# Patient Record
Sex: Female | Born: 1950 | Race: White | Hispanic: No | Smoking: Former smoker
Health system: Southern US, Community
[De-identification: ages and names within clinical notes are randomized; demographics above are authoritative.]

## PROBLEM LIST (undated history)

## (undated) DIAGNOSIS — M1990 Unspecified osteoarthritis, unspecified site: Secondary | ICD-10-CM

## (undated) DIAGNOSIS — J45909 Unspecified asthma, uncomplicated: Secondary | ICD-10-CM

## (undated) DIAGNOSIS — D649 Anemia, unspecified: Secondary | ICD-10-CM

## (undated) DIAGNOSIS — I10 Essential (primary) hypertension: Secondary | ICD-10-CM

## (undated) DIAGNOSIS — R0600 Dyspnea, unspecified: Secondary | ICD-10-CM

## (undated) DIAGNOSIS — J449 Chronic obstructive pulmonary disease, unspecified: Secondary | ICD-10-CM

## (undated) DIAGNOSIS — G5602 Carpal tunnel syndrome, left upper limb: Secondary | ICD-10-CM

## (undated) DIAGNOSIS — Z87442 Personal history of urinary calculi: Secondary | ICD-10-CM

## (undated) DIAGNOSIS — F419 Anxiety disorder, unspecified: Secondary | ICD-10-CM

## (undated) DIAGNOSIS — E785 Hyperlipidemia, unspecified: Secondary | ICD-10-CM

## (undated) DIAGNOSIS — J189 Pneumonia, unspecified organism: Secondary | ICD-10-CM

## (undated) DIAGNOSIS — I639 Cerebral infarction, unspecified: Secondary | ICD-10-CM

## (undated) HISTORY — PX: CARPAL TUNNEL RELEASE: SHX101

## (undated) HISTORY — PX: ROTATOR CUFF REPAIR: SHX139

## (undated) HISTORY — PX: TUBAL LIGATION: SHX77

## (undated) HISTORY — PX: OTHER SURGICAL HISTORY: SHX169

---

## 2004-12-29 ENCOUNTER — Ambulatory Visit: Payer: Self-pay | Admitting: Family Medicine

## 2005-08-15 ENCOUNTER — Ambulatory Visit: Payer: Self-pay | Admitting: Family Medicine

## 2005-09-04 ENCOUNTER — Ambulatory Visit: Payer: Self-pay | Admitting: Family Medicine

## 2005-10-12 ENCOUNTER — Ambulatory Visit: Payer: Self-pay | Admitting: Gastroenterology

## 2005-12-21 ENCOUNTER — Ambulatory Visit: Payer: Self-pay | Admitting: Physician Assistant

## 2006-01-05 ENCOUNTER — Other Ambulatory Visit: Payer: Self-pay

## 2006-01-12 ENCOUNTER — Ambulatory Visit: Payer: Self-pay | Admitting: Unknown Physician Specialty

## 2006-01-21 ENCOUNTER — Emergency Department: Payer: Self-pay | Admitting: Emergency Medicine

## 2006-01-21 ENCOUNTER — Other Ambulatory Visit: Payer: Self-pay

## 2006-09-25 ENCOUNTER — Ambulatory Visit: Payer: Self-pay | Admitting: Family Medicine

## 2006-10-09 ENCOUNTER — Ambulatory Visit: Payer: Self-pay | Admitting: Family Medicine

## 2006-10-30 ENCOUNTER — Ambulatory Visit: Payer: Self-pay | Admitting: Family Medicine

## 2007-05-26 ENCOUNTER — Emergency Department: Payer: Self-pay | Admitting: Emergency Medicine

## 2007-07-10 ENCOUNTER — Ambulatory Visit: Payer: Self-pay | Admitting: Family Medicine

## 2007-12-10 ENCOUNTER — Ambulatory Visit: Payer: Self-pay | Admitting: Family Medicine

## 2008-02-19 ENCOUNTER — Encounter: Payer: Self-pay | Admitting: Family Medicine

## 2008-02-26 ENCOUNTER — Encounter: Payer: Self-pay | Admitting: Family Medicine

## 2008-03-28 ENCOUNTER — Encounter: Payer: Self-pay | Admitting: Family Medicine

## 2009-04-05 ENCOUNTER — Inpatient Hospital Stay: Payer: Self-pay | Admitting: Internal Medicine

## 2009-06-02 ENCOUNTER — Ambulatory Visit: Payer: Self-pay | Admitting: Family Medicine

## 2009-11-03 ENCOUNTER — Ambulatory Visit: Payer: Self-pay

## 2012-08-16 ENCOUNTER — Inpatient Hospital Stay: Payer: Self-pay | Admitting: Internal Medicine

## 2012-08-16 LAB — URINALYSIS, COMPLETE
Bilirubin,UR: NEGATIVE
Hyaline Cast: 12
Nitrite: NEGATIVE
Ph: 5 (ref 4.5–8.0)
Protein: 30
RBC,UR: 2 /HPF (ref 0–5)
Specific Gravity: 1.029 (ref 1.003–1.030)
Squamous Epithelial: 12
WBC UR: 4 /HPF (ref 0–5)

## 2012-08-16 LAB — CBC
HCT: 44.2 % (ref 35.0–47.0)
HGB: 14.7 g/dL (ref 12.0–16.0)
MCH: 28.5 pg (ref 26.0–34.0)
MCHC: 33.2 g/dL (ref 32.0–36.0)
Platelet: 198 10*3/uL (ref 150–440)
RDW: 15.1 % — ABNORMAL HIGH (ref 11.5–14.5)
WBC: 13.9 10*3/uL — ABNORMAL HIGH (ref 3.6–11.0)

## 2012-08-16 LAB — COMPREHENSIVE METABOLIC PANEL
Alkaline Phosphatase: 57 U/L (ref 50–136)
Anion Gap: 6 — ABNORMAL LOW (ref 7–16)
Calcium, Total: 8.7 mg/dL (ref 8.5–10.1)
Chloride: 100 mmol/L (ref 98–107)
Co2: 31 mmol/L (ref 21–32)
Creatinine: 1.59 mg/dL — ABNORMAL HIGH (ref 0.60–1.30)
EGFR (African American): 40 — ABNORMAL LOW
EGFR (Non-African Amer.): 34 — ABNORMAL LOW
Potassium: 3.3 mmol/L — ABNORMAL LOW (ref 3.5–5.1)
SGOT(AST): 28 U/L (ref 15–37)
Total Protein: 7.6 g/dL (ref 6.4–8.2)

## 2012-08-16 LAB — RAPID INFLUENZA A&B ANTIGENS

## 2012-08-17 LAB — CBC WITH DIFFERENTIAL/PLATELET
Basophil #: 0 10*3/uL (ref 0.0–0.1)
Basophil %: 0.1 %
Eosinophil #: 0 10*3/uL (ref 0.0–0.7)
HCT: 41.5 % (ref 35.0–47.0)
HGB: 14 g/dL (ref 12.0–16.0)
Lymphocyte #: 0.8 10*3/uL — ABNORMAL LOW (ref 1.0–3.6)
Lymphocyte %: 7.3 %
MCHC: 33.8 g/dL (ref 32.0–36.0)
Monocyte #: 0.2 x10 3/mm (ref 0.2–0.9)
Monocyte %: 1.9 %
Neutrophil #: 10 10*3/uL — ABNORMAL HIGH (ref 1.4–6.5)
Platelet: 203 10*3/uL (ref 150–440)
RBC: 4.86 10*6/uL (ref 3.80–5.20)
WBC: 11 10*3/uL (ref 3.6–11.0)

## 2012-08-17 LAB — BASIC METABOLIC PANEL
Anion Gap: 9 (ref 7–16)
Calcium, Total: 8.6 mg/dL (ref 8.5–10.1)
Chloride: 99 mmol/L (ref 98–107)
EGFR (Non-African Amer.): 31 — ABNORMAL LOW
Glucose: 197 mg/dL — ABNORMAL HIGH (ref 65–99)
Osmolality: 281 (ref 275–301)
Potassium: 3.5 mmol/L (ref 3.5–5.1)

## 2012-08-18 LAB — BASIC METABOLIC PANEL
Anion Gap: 9 (ref 7–16)
BUN: 23 mg/dL — ABNORMAL HIGH (ref 7–18)
Calcium, Total: 8.7 mg/dL (ref 8.5–10.1)
Co2: 26 mmol/L (ref 21–32)
Creatinine: 1.5 mg/dL — ABNORMAL HIGH (ref 0.60–1.30)
EGFR (African American): 43 — ABNORMAL LOW
Glucose: 218 mg/dL — ABNORMAL HIGH (ref 65–99)
Osmolality: 282 (ref 275–301)
Potassium: 3.8 mmol/L (ref 3.5–5.1)

## 2012-08-18 LAB — MAGNESIUM: Magnesium: 2.1 mg/dL

## 2012-08-19 LAB — BASIC METABOLIC PANEL
Anion Gap: 7 (ref 7–16)
BUN: 29 mg/dL — ABNORMAL HIGH (ref 7–18)
Calcium, Total: 9.4 mg/dL (ref 8.5–10.1)
Creatinine: 1.54 mg/dL — ABNORMAL HIGH (ref 0.60–1.30)
EGFR (African American): 41 — ABNORMAL LOW
EGFR (Non-African Amer.): 36 — ABNORMAL LOW
Glucose: 224 mg/dL — ABNORMAL HIGH (ref 65–99)
Osmolality: 283 (ref 275–301)

## 2012-08-20 LAB — BASIC METABOLIC PANEL
BUN: 35 mg/dL — ABNORMAL HIGH (ref 7–18)
Calcium, Total: 9.4 mg/dL (ref 8.5–10.1)
Co2: 27 mmol/L (ref 21–32)
EGFR (African American): 38 — ABNORMAL LOW
Glucose: 244 mg/dL — ABNORMAL HIGH (ref 65–99)
Sodium: 138 mmol/L (ref 136–145)

## 2012-08-21 LAB — PLATELET COUNT: Platelet: 242 10*3/uL (ref 150–440)

## 2012-08-22 LAB — BASIC METABOLIC PANEL
Calcium, Total: 8.8 mg/dL (ref 8.5–10.1)
EGFR (African American): 43 — ABNORMAL LOW
EGFR (Non-African Amer.): 37 — ABNORMAL LOW
Glucose: 295 mg/dL — ABNORMAL HIGH (ref 65–99)
Osmolality: 292 (ref 275–301)

## 2012-08-22 LAB — CULTURE, BLOOD (SINGLE)

## 2013-08-07 ENCOUNTER — Ambulatory Visit: Payer: Self-pay | Admitting: Internal Medicine

## 2014-04-23 ENCOUNTER — Emergency Department: Payer: Self-pay | Admitting: Emergency Medicine

## 2014-04-23 LAB — COMPREHENSIVE METABOLIC PANEL
ALT: 16 U/L
Albumin: 3.2 g/dL — ABNORMAL LOW (ref 3.4–5.0)
Alkaline Phosphatase: 70 U/L
Anion Gap: 8 (ref 7–16)
BILIRUBIN TOTAL: 0.4 mg/dL (ref 0.2–1.0)
BUN: 14 mg/dL (ref 7–18)
Calcium, Total: 9.3 mg/dL (ref 8.5–10.1)
Chloride: 104 mmol/L (ref 98–107)
Co2: 29 mmol/L (ref 21–32)
Creatinine: 1.34 mg/dL — ABNORMAL HIGH (ref 0.60–1.30)
EGFR (African American): 48 — ABNORMAL LOW
GFR CALC NON AF AMER: 42 — AB
Glucose: 90 mg/dL (ref 65–99)
OSMOLALITY: 281 (ref 275–301)
POTASSIUM: 3.8 mmol/L (ref 3.5–5.1)
SGOT(AST): 22 U/L (ref 15–37)
Sodium: 141 mmol/L (ref 136–145)
Total Protein: 7.4 g/dL (ref 6.4–8.2)

## 2014-04-23 LAB — CBC
HCT: 41.9 % (ref 35.0–47.0)
HGB: 13.7 g/dL (ref 12.0–16.0)
MCH: 28.4 pg (ref 26.0–34.0)
MCHC: 32.7 g/dL (ref 32.0–36.0)
MCV: 87 fL (ref 80–100)
PLATELETS: 220 10*3/uL (ref 150–440)
RBC: 4.82 10*6/uL (ref 3.80–5.20)
RDW: 14.7 % — ABNORMAL HIGH (ref 11.5–14.5)
WBC: 14.6 10*3/uL — ABNORMAL HIGH (ref 3.6–11.0)

## 2014-04-23 LAB — TROPONIN I: Troponin-I: <0.02 ng/mL

## 2014-05-21 ENCOUNTER — Inpatient Hospital Stay: Payer: Self-pay | Admitting: Specialist

## 2014-05-21 LAB — PRO B NATRIURETIC PEPTIDE: B-Type Natriuretic Peptide: 301 pg/mL — ABNORMAL HIGH (ref 0–125)

## 2014-05-21 LAB — COMPREHENSIVE METABOLIC PANEL
ALBUMIN: 2.6 g/dL — AB (ref 3.4–5.0)
ALK PHOS: 60 U/L
ANION GAP: 8 (ref 7–16)
BUN: 13 mg/dL (ref 7–18)
Bilirubin,Total: 0.3 mg/dL (ref 0.2–1.0)
CALCIUM: 8.6 mg/dL (ref 8.5–10.1)
CREATININE: 1.17 mg/dL (ref 0.60–1.30)
Chloride: 100 mmol/L (ref 98–107)
Co2: 28 mmol/L (ref 21–32)
EGFR (African American): 60 — ABNORMAL LOW
EGFR (Non-African Amer.): 50 — ABNORMAL LOW
GLUCOSE: 130 mg/dL — AB (ref 65–99)
OSMOLALITY: 274 (ref 275–301)
POTASSIUM: 4.1 mmol/L (ref 3.5–5.1)
SGOT(AST): 38 U/L — ABNORMAL HIGH (ref 15–37)
SGPT (ALT): 13 U/L — ABNORMAL LOW
Sodium: 136 mmol/L (ref 136–145)
Total Protein: 7.7 g/dL (ref 6.4–8.2)

## 2014-05-21 LAB — CBC
HCT: 38.2 % (ref 35.0–47.0)
HGB: 11.9 g/dL — ABNORMAL LOW (ref 12.0–16.0)
MCH: 26.7 pg (ref 26.0–34.0)
MCHC: 31.1 g/dL — AB (ref 32.0–36.0)
MCV: 86 fL (ref 80–100)
Platelet: 307 10*3/uL (ref 150–440)
RBC: 4.45 10*6/uL (ref 3.80–5.20)
RDW: 14.1 % (ref 11.5–14.5)
WBC: 17.4 10*3/uL — ABNORMAL HIGH (ref 3.6–11.0)

## 2014-05-21 LAB — TROPONIN I

## 2014-05-21 LAB — CK TOTAL AND CKMB (NOT AT ARMC)
CK, TOTAL: 108 U/L
CK-MB: 0.8 ng/mL (ref 0.5–3.6)

## 2014-05-22 LAB — BASIC METABOLIC PANEL
ANION GAP: 11 (ref 7–16)
BUN: 19 mg/dL — AB (ref 7–18)
CHLORIDE: 100 mmol/L (ref 98–107)
CO2: 25 mmol/L (ref 21–32)
CREATININE: 1.44 mg/dL — AB (ref 0.60–1.30)
Calcium, Total: 9 mg/dL (ref 8.5–10.1)
EGFR (African American): 47 — ABNORMAL LOW
EGFR (Non-African Amer.): 39 — ABNORMAL LOW
Glucose: 250 mg/dL — ABNORMAL HIGH (ref 65–99)
OSMOLALITY: 283 (ref 275–301)
Potassium: 3.4 mmol/L — ABNORMAL LOW (ref 3.5–5.1)
SODIUM: 136 mmol/L (ref 136–145)

## 2014-05-22 LAB — CBC WITH DIFFERENTIAL/PLATELET
BASOS PCT: 0.2 %
Basophil #: 0 10*3/uL (ref 0.0–0.1)
Eosinophil #: 0 10*3/uL (ref 0.0–0.7)
Eosinophil %: 0 %
HCT: 36.7 % (ref 35.0–47.0)
HGB: 11.8 g/dL — ABNORMAL LOW (ref 12.0–16.0)
LYMPHS ABS: 0.9 10*3/uL — AB (ref 1.0–3.6)
LYMPHS PCT: 5.9 %
MCH: 27.4 pg (ref 26.0–34.0)
MCHC: 32.3 g/dL (ref 32.0–36.0)
MCV: 85 fL (ref 80–100)
Monocyte #: 0.5 x10 3/mm (ref 0.2–0.9)
Monocyte %: 2.8 %
NEUTROS ABS: 14.5 10*3/uL — AB (ref 1.4–6.5)
NEUTROS PCT: 91.1 %
Platelet: 310 10*3/uL (ref 150–440)
RBC: 4.32 10*6/uL (ref 3.80–5.20)
RDW: 14.1 % (ref 11.5–14.5)
WBC: 15.9 10*3/uL — AB (ref 3.6–11.0)

## 2014-05-23 LAB — CBC WITH DIFFERENTIAL/PLATELET
Basophil #: 0.2 10*3/uL — ABNORMAL HIGH (ref 0.0–0.1)
Basophil %: 0.7 %
EOS PCT: 0.1 %
Eosinophil #: 0 10*3/uL (ref 0.0–0.7)
HCT: 37.5 % (ref 35.0–47.0)
HGB: 11.7 g/dL — AB (ref 12.0–16.0)
Lymphocyte #: 0.8 10*3/uL — ABNORMAL LOW (ref 1.0–3.6)
Lymphocyte %: 3.4 %
MCH: 27.1 pg (ref 26.0–34.0)
MCHC: 31 g/dL — ABNORMAL LOW (ref 32.0–36.0)
MCV: 87 fL (ref 80–100)
MONOS PCT: 3.4 %
Monocyte #: 0.9 x10 3/mm (ref 0.2–0.9)
Neutrophil #: 22.9 10*3/uL — ABNORMAL HIGH (ref 1.4–6.5)
Neutrophil %: 92.4 %
Platelet: 335 10*3/uL (ref 150–440)
RBC: 4.31 10*6/uL (ref 3.80–5.20)
RDW: 14.4 % (ref 11.5–14.5)
WBC: 24.8 10*3/uL — ABNORMAL HIGH (ref 3.6–11.0)

## 2014-05-23 LAB — BASIC METABOLIC PANEL
Anion Gap: 7 (ref 7–16)
BUN: 27 mg/dL — ABNORMAL HIGH (ref 7–18)
CO2: 29 mmol/L (ref 21–32)
Calcium, Total: 8.8 mg/dL (ref 8.5–10.1)
Chloride: 96 mmol/L — ABNORMAL LOW (ref 98–107)
Creatinine: 1.56 mg/dL — ABNORMAL HIGH (ref 0.60–1.30)
EGFR (African American): 43 — ABNORMAL LOW
EGFR (Non-African Amer.): 36 — ABNORMAL LOW
GLUCOSE: 333 mg/dL — AB (ref 65–99)
OSMOLALITY: 283 (ref 275–301)
Potassium: 3.4 mmol/L — ABNORMAL LOW (ref 3.5–5.1)
SODIUM: 132 mmol/L — AB (ref 136–145)

## 2014-05-24 LAB — BASIC METABOLIC PANEL
Anion Gap: 9 (ref 7–16)
BUN: 33 mg/dL — AB (ref 7–18)
CHLORIDE: 100 mmol/L (ref 98–107)
CO2: 29 mmol/L (ref 21–32)
Calcium, Total: 9.2 mg/dL (ref 8.5–10.1)
Creatinine: 1.7 mg/dL — ABNORMAL HIGH (ref 0.60–1.30)
EGFR (African American): 39 — ABNORMAL LOW
EGFR (Non-African Amer.): 32 — ABNORMAL LOW
GLUCOSE: 337 mg/dL — AB (ref 65–99)
OSMOLALITY: 296 (ref 275–301)
POTASSIUM: 3.5 mmol/L (ref 3.5–5.1)
SODIUM: 138 mmol/L (ref 136–145)

## 2014-05-24 LAB — HEMOGLOBIN A1C: Hemoglobin A1C: 6.7 % — ABNORMAL HIGH (ref 4.2–6.3)

## 2014-05-25 LAB — CBC WITH DIFFERENTIAL/PLATELET
HCT: 38.5 % (ref 35.0–47.0)
HGB: 11.6 g/dL — ABNORMAL LOW (ref 12.0–16.0)
Lymphocytes: 7 %
MCH: 26.1 pg (ref 26.0–34.0)
MCHC: 30.2 g/dL — AB (ref 32.0–36.0)
MCV: 86 fL (ref 80–100)
METAMYELOCYTE: 1 %
Monocytes: 5 %
PLATELETS: 324 10*3/uL (ref 150–440)
RBC: 4.46 10*6/uL (ref 3.80–5.20)
RDW: 14.8 % — AB (ref 11.5–14.5)
Segmented Neutrophils: 87 %
WBC: 21.8 10*3/uL — ABNORMAL HIGH (ref 3.6–11.0)

## 2014-05-25 LAB — BASIC METABOLIC PANEL
Anion Gap: 7 (ref 7–16)
BUN: 34 mg/dL — ABNORMAL HIGH (ref 7–18)
CO2: 32 mmol/L (ref 21–32)
CREATININE: 1.5 mg/dL — AB (ref 0.60–1.30)
Calcium, Total: 8.7 mg/dL (ref 8.5–10.1)
Chloride: 99 mmol/L (ref 98–107)
EGFR (African American): 45 — ABNORMAL LOW
EGFR (Non-African Amer.): 37 — ABNORMAL LOW
Glucose: 271 mg/dL — ABNORMAL HIGH (ref 65–99)
Osmolality: 293 (ref 275–301)
Potassium: 3.4 mmol/L — ABNORMAL LOW (ref 3.5–5.1)
SODIUM: 138 mmol/L (ref 136–145)

## 2014-05-26 LAB — BASIC METABOLIC PANEL
ANION GAP: 5 — AB (ref 7–16)
BUN: 31 mg/dL — ABNORMAL HIGH (ref 7–18)
Calcium, Total: 8.9 mg/dL (ref 8.5–10.1)
Chloride: 100 mmol/L (ref 98–107)
Co2: 35 mmol/L — ABNORMAL HIGH (ref 21–32)
Creatinine: 1.34 mg/dL — ABNORMAL HIGH (ref 0.60–1.30)
EGFR (Non-African Amer.): 42 — ABNORMAL LOW
GFR CALC AF AMER: 51 — AB
Glucose: 211 mg/dL — ABNORMAL HIGH (ref 65–99)
Osmolality: 292 (ref 275–301)
POTASSIUM: 3.6 mmol/L (ref 3.5–5.1)
Sodium: 140 mmol/L (ref 136–145)

## 2014-05-26 LAB — CULTURE, BLOOD (SINGLE)

## 2014-06-13 ENCOUNTER — Inpatient Hospital Stay: Payer: Self-pay | Admitting: Internal Medicine

## 2014-06-13 LAB — CBC
HCT: 32.7 % — ABNORMAL LOW (ref 35.0–47.0)
HGB: 10.4 g/dL — AB (ref 12.0–16.0)
MCH: 26.6 pg (ref 26.0–34.0)
MCHC: 31.6 g/dL — AB (ref 32.0–36.0)
MCV: 84 fL (ref 80–100)
Platelet: 492 10*3/uL — ABNORMAL HIGH (ref 150–440)
RBC: 3.9 10*6/uL (ref 3.80–5.20)
RDW: 15.7 % — ABNORMAL HIGH (ref 11.5–14.5)
WBC: 23.2 10*3/uL — ABNORMAL HIGH (ref 3.6–11.0)

## 2014-06-13 LAB — COMPREHENSIVE METABOLIC PANEL
ALBUMIN: 2 g/dL — AB (ref 3.4–5.0)
ALK PHOS: 57 U/L
ALT: 39 U/L
Anion Gap: 11 (ref 7–16)
BUN: 17 mg/dL (ref 7–18)
Bilirubin,Total: 0.4 mg/dL (ref 0.2–1.0)
CALCIUM: 8.3 mg/dL — AB (ref 8.5–10.1)
Chloride: 99 mmol/L (ref 98–107)
Co2: 29 mmol/L (ref 21–32)
Creatinine: 1.65 mg/dL — ABNORMAL HIGH (ref 0.60–1.30)
EGFR (African American): 40 — ABNORMAL LOW
GFR CALC NON AF AMER: 33 — AB
Glucose: 148 mg/dL — ABNORMAL HIGH (ref 65–99)
Osmolality: 282 (ref 275–301)
Potassium: 3.3 mmol/L — ABNORMAL LOW (ref 3.5–5.1)
SGOT(AST): 28 U/L (ref 15–37)
Sodium: 139 mmol/L (ref 136–145)
Total Protein: 7.1 g/dL (ref 6.4–8.2)

## 2014-06-13 LAB — APTT: Activated PTT: 28.6 secs (ref 23.6–35.9)

## 2014-06-13 LAB — PROTIME-INR
INR: 1.2
Prothrombin Time: 14.9 secs — ABNORMAL HIGH (ref 11.5–14.7)

## 2014-06-13 LAB — TROPONIN I

## 2014-06-14 LAB — BASIC METABOLIC PANEL
Anion Gap: 13 (ref 7–16)
BUN: 25 mg/dL — ABNORMAL HIGH (ref 7–18)
CALCIUM: 8.7 mg/dL (ref 8.5–10.1)
CO2: 27 mmol/L (ref 21–32)
CREATININE: 2.07 mg/dL — AB (ref 0.60–1.30)
Chloride: 97 mmol/L — ABNORMAL LOW (ref 98–107)
EGFR (Non-African Amer.): 26 — ABNORMAL LOW
GFR CALC AF AMER: 31 — AB
GLUCOSE: 245 mg/dL — AB (ref 65–99)
OSMOLALITY: 286 (ref 275–301)
Potassium: 3.3 mmol/L — ABNORMAL LOW (ref 3.5–5.1)
Sodium: 137 mmol/L (ref 136–145)

## 2014-06-14 LAB — CBC WITH DIFFERENTIAL/PLATELET
BASOS ABS: 0 10*3/uL (ref 0.0–0.1)
Basophil %: 0.1 %
EOS ABS: 0 10*3/uL (ref 0.0–0.7)
Eosinophil %: 0 %
HCT: 31.6 % — AB (ref 35.0–47.0)
HGB: 9.6 g/dL — ABNORMAL LOW (ref 12.0–16.0)
Lymphocyte #: 1 10*3/uL (ref 1.0–3.6)
Lymphocyte %: 4.4 %
MCH: 25.7 pg — ABNORMAL LOW (ref 26.0–34.0)
MCHC: 30.5 g/dL — ABNORMAL LOW (ref 32.0–36.0)
MCV: 84 fL (ref 80–100)
Monocyte #: 0.7 x10 3/mm (ref 0.2–0.9)
Monocyte %: 3.2 %
NEUTROS ABS: 21 10*3/uL — AB (ref 1.4–6.5)
NEUTROS PCT: 92.3 %
PLATELETS: 479 10*3/uL — AB (ref 150–440)
RBC: 3.75 10*6/uL — ABNORMAL LOW (ref 3.80–5.20)
RDW: 16 % — AB (ref 11.5–14.5)
WBC: 22.7 10*3/uL — ABNORMAL HIGH (ref 3.6–11.0)

## 2014-06-14 LAB — HEMOGLOBIN A1C: Hemoglobin A1C: 7.1 % — ABNORMAL HIGH (ref 4.2–6.3)

## 2014-06-14 LAB — SEDIMENTATION RATE

## 2014-06-15 LAB — CBC WITH DIFFERENTIAL/PLATELET
BASOS ABS: 0.1 10*3/uL (ref 0.0–0.1)
Basophil %: 0.2 %
EOS ABS: 0 10*3/uL (ref 0.0–0.7)
Eosinophil %: 0 %
HCT: 30.5 % — ABNORMAL LOW (ref 35.0–47.0)
HGB: 9.3 g/dL — ABNORMAL LOW (ref 12.0–16.0)
Lymphocyte #: 1.4 10*3/uL (ref 1.0–3.6)
Lymphocyte %: 5.1 %
MCH: 26 pg (ref 26.0–34.0)
MCHC: 30.6 g/dL — ABNORMAL LOW (ref 32.0–36.0)
MCV: 85 fL (ref 80–100)
Monocyte #: 1.4 x10 3/mm — ABNORMAL HIGH (ref 0.2–0.9)
Monocyte %: 4.9 %
NEUTROS PCT: 89.8 %
Neutrophil #: 25.2 10*3/uL — ABNORMAL HIGH (ref 1.4–6.5)
Platelet: 555 10*3/uL — ABNORMAL HIGH (ref 150–440)
RBC: 3.6 10*6/uL — ABNORMAL LOW (ref 3.80–5.20)
RDW: 16.1 % — ABNORMAL HIGH (ref 11.5–14.5)
WBC: 28.1 10*3/uL — ABNORMAL HIGH (ref 3.6–11.0)

## 2014-06-15 LAB — BASIC METABOLIC PANEL
Anion Gap: 11 (ref 7–16)
BUN: 38 mg/dL — AB (ref 7–18)
CALCIUM: 9.1 mg/dL (ref 8.5–10.1)
CHLORIDE: 96 mmol/L — AB (ref 98–107)
CREATININE: 2.53 mg/dL — AB (ref 0.60–1.30)
Co2: 27 mmol/L (ref 21–32)
EGFR (African American): 25 — ABNORMAL LOW
EGFR (Non-African Amer.): 20 — ABNORMAL LOW
Glucose: 169 mg/dL — ABNORMAL HIGH (ref 65–99)
Osmolality: 281 (ref 275–301)
POTASSIUM: 4.1 mmol/L (ref 3.5–5.1)
Sodium: 134 mmol/L — ABNORMAL LOW (ref 136–145)

## 2014-06-16 LAB — URINALYSIS, COMPLETE
Bacteria: NONE SEEN
Bilirubin,UR: NEGATIVE
Blood: NEGATIVE
GLUCOSE, UR: NEGATIVE mg/dL (ref 0–75)
Ketone: NEGATIVE
LEUKOCYTE ESTERASE: NEGATIVE
Nitrite: NEGATIVE
Ph: 5 (ref 4.5–8.0)
Protein: NEGATIVE
RBC,UR: NONE SEEN /HPF (ref 0–5)
Specific Gravity: 1.003 (ref 1.003–1.030)
Squamous Epithelial: NONE SEEN
WBC UR: NONE SEEN /HPF (ref 0–5)

## 2014-06-16 LAB — CREATININE, SERUM
CREATININE: 2.14 mg/dL — AB (ref 0.60–1.30)
EGFR (African American): 30 — ABNORMAL LOW
EGFR (Non-African Amer.): 25 — ABNORMAL LOW

## 2014-06-16 LAB — HEMOGLOBIN A1C: HEMOGLOBIN A1C: 7.5 % — AB (ref 4.2–6.3)

## 2014-06-16 LAB — BASIC METABOLIC PANEL
Anion Gap: 9 (ref 7–16)
BUN: 47 mg/dL — ABNORMAL HIGH (ref 7–18)
CALCIUM: 8.9 mg/dL (ref 8.5–10.1)
Chloride: 97 mmol/L — ABNORMAL LOW (ref 98–107)
Co2: 29 mmol/L (ref 21–32)
GLUCOSE: 119 mg/dL — AB (ref 65–99)
OSMOLALITY: 283 (ref 275–301)
Potassium: 3.8 mmol/L (ref 3.5–5.1)
Sodium: 135 mmol/L — ABNORMAL LOW (ref 136–145)

## 2014-06-16 LAB — CBC WITH DIFFERENTIAL/PLATELET
Eosinophil: 3 %
HCT: 30.6 % — AB (ref 35.0–47.0)
HGB: 9.4 g/dL — ABNORMAL LOW (ref 12.0–16.0)
LYMPHS PCT: 7 %
MCH: 25.9 pg — ABNORMAL LOW (ref 26.0–34.0)
MCHC: 30.8 g/dL — ABNORMAL LOW (ref 32.0–36.0)
MCV: 84 fL (ref 80–100)
Monocytes: 7 %
PLATELETS: 544 10*3/uL — AB (ref 150–440)
RBC: 3.62 10*6/uL — ABNORMAL LOW (ref 3.80–5.20)
RDW: 16.2 % — ABNORMAL HIGH (ref 11.5–14.5)
Segmented Neutrophils: 83 %
WBC: 21.6 10*3/uL — ABNORMAL HIGH (ref 3.6–11.0)

## 2014-06-16 LAB — PROTEIN / CREATININE RATIO, URINE
CREATININE, URINE: 24.7 mg/dL — AB (ref 30.0–125.0)
PROTEIN/CREAT. RATIO: 324 mg/g{creat} — AB (ref 0–200)
Protein, Random Urine: 8 mg/dL (ref 0–12)

## 2014-06-17 LAB — EXPECTORATED SPUTUM ASSESSMENT W REFEX TO RESP CULTURE

## 2014-06-17 LAB — VANCOMYCIN, TROUGH: VANCOMYCIN, TROUGH: 9 ug/mL — AB (ref 10–20)

## 2014-06-18 LAB — CBC WITH DIFFERENTIAL/PLATELET
EOS PCT: 5 %
HCT: 31.8 % — AB (ref 35.0–47.0)
HGB: 9.6 g/dL — AB (ref 12.0–16.0)
Lymphocytes: 6 %
MCH: 25.4 pg — ABNORMAL LOW (ref 26.0–34.0)
MCHC: 30.2 g/dL — AB (ref 32.0–36.0)
MCV: 84 fL (ref 80–100)
MONOS PCT: 10 %
Myelocyte: 3 %
Platelet: 601 10*3/uL — ABNORMAL HIGH (ref 150–440)
RBC: 3.78 10*6/uL — AB (ref 3.80–5.20)
RDW: 16.3 % — AB (ref 11.5–14.5)
Segmented Neutrophils: 76 %
WBC: 21.4 10*3/uL — ABNORMAL HIGH (ref 3.6–11.0)

## 2014-06-18 LAB — RENAL FUNCTION PANEL
Albumin: 1.8 g/dL — ABNORMAL LOW (ref 3.4–5.0)
Anion Gap: 6 — ABNORMAL LOW (ref 7–16)
BUN: 22 mg/dL — ABNORMAL HIGH (ref 7–18)
CALCIUM: 8.9 mg/dL (ref 8.5–10.1)
Chloride: 105 mmol/L (ref 98–107)
Co2: 30 mmol/L (ref 21–32)
Creatinine: 1.35 mg/dL — ABNORMAL HIGH (ref 0.60–1.30)
EGFR (African American): 51 — ABNORMAL LOW
EGFR (Non-African Amer.): 42 — ABNORMAL LOW
GLUCOSE: 101 mg/dL — AB (ref 65–99)
OSMOLALITY: 285 (ref 275–301)
PHOSPHORUS: 2.8 mg/dL (ref 2.5–4.9)
POTASSIUM: 4 mmol/L (ref 3.5–5.1)
Sodium: 141 mmol/L (ref 136–145)

## 2014-06-18 LAB — BASIC METABOLIC PANEL
Anion Gap: 6 — ABNORMAL LOW (ref 7–16)
BUN: 20 mg/dL — AB (ref 7–18)
Calcium, Total: 9.3 mg/dL (ref 8.5–10.1)
Chloride: 104 mmol/L (ref 98–107)
Co2: 31 mmol/L (ref 21–32)
Creatinine: 1.35 mg/dL — ABNORMAL HIGH (ref 0.60–1.30)
EGFR (African American): 51 — ABNORMAL LOW
EGFR (Non-African Amer.): 42 — ABNORMAL LOW
Glucose: 121 mg/dL — ABNORMAL HIGH (ref 65–99)
OSMOLALITY: 285 (ref 275–301)
Potassium: 3.9 mmol/L (ref 3.5–5.1)
Sodium: 141 mmol/L (ref 136–145)

## 2014-06-18 LAB — CULTURE, BLOOD (SINGLE)

## 2014-06-19 LAB — CBC WITH DIFFERENTIAL/PLATELET
Bands: 3 %
EOS PCT: 7 %
HCT: 30.1 % — ABNORMAL LOW (ref 35.0–47.0)
HGB: 9.4 g/dL — ABNORMAL LOW (ref 12.0–16.0)
LYMPHS PCT: 8 %
MCH: 26.6 pg (ref 26.0–34.0)
MCHC: 31.4 g/dL — ABNORMAL LOW (ref 32.0–36.0)
MCV: 85 fL (ref 80–100)
METAMYELOCYTE: 4 %
MYELOCYTE: 4 %
Monocytes: 5 %
Platelet: 559 10*3/uL — ABNORMAL HIGH (ref 150–440)
RBC: 3.55 10*6/uL — ABNORMAL LOW (ref 3.80–5.20)
RDW: 16.9 % — AB (ref 11.5–14.5)
Segmented Neutrophils: 69 %
WBC: 21 10*3/uL — ABNORMAL HIGH (ref 3.6–11.0)

## 2014-06-19 LAB — VANCOMYCIN, TROUGH: Vancomycin, Trough: 13 ug/mL (ref 10–20)

## 2014-06-19 LAB — RAPID HIV SCREEN (HIV 1/2 AB+AG)

## 2014-06-20 LAB — CREATININE, SERUM
CREATININE: 1.14 mg/dL (ref 0.60–1.30)
EGFR (African American): >60
EGFR (Non-African Amer.): 51 — ABNORMAL LOW

## 2014-06-21 LAB — CBC WITH DIFFERENTIAL/PLATELET
BANDS NEUTROPHIL: 1 %
EOS PCT: 8 %
HCT: 31.4 % — ABNORMAL LOW (ref 35.0–47.0)
HGB: 9.5 g/dL — AB (ref 12.0–16.0)
Lymphocytes: 24 %
MCH: 26 pg (ref 26.0–34.0)
MCHC: 30.3 g/dL — ABNORMAL LOW (ref 32.0–36.0)
MCV: 86 fL (ref 80–100)
MONOS PCT: 9 %
Metamyelocyte: 3 %
Myelocyte: 2 %
Platelet: 456 10*3/uL — ABNORMAL HIGH (ref 150–440)
RBC: 3.67 10*6/uL — ABNORMAL LOW (ref 3.80–5.20)
RDW: 17.1 % — ABNORMAL HIGH (ref 11.5–14.5)
Segmented Neutrophils: 53 %
WBC: 19.6 10*3/uL — ABNORMAL HIGH (ref 3.6–11.0)

## 2014-06-22 LAB — CBC WITH DIFFERENTIAL/PLATELET
Basophil #: 0.1 10*3/uL (ref 0.0–0.1)
Basophil %: 0.4 %
EOS ABS: 1.1 10*3/uL — AB (ref 0.0–0.7)
Eosinophil %: 5.5 %
HCT: 29.8 % — AB (ref 35.0–47.0)
HGB: 9.3 g/dL — AB (ref 12.0–16.0)
LYMPHS ABS: 2.4 10*3/uL (ref 1.0–3.6)
Lymphocyte %: 12.5 %
MCH: 26.5 pg (ref 26.0–34.0)
MCHC: 31.2 g/dL — AB (ref 32.0–36.0)
MCV: 85 fL (ref 80–100)
MONOS PCT: 9.1 %
Monocyte #: 1.8 x10 3/mm — ABNORMAL HIGH (ref 0.2–0.9)
NEUTROS PCT: 72.5 %
Neutrophil #: 13.9 10*3/uL — ABNORMAL HIGH (ref 1.4–6.5)
Platelet: 393 10*3/uL (ref 150–440)
RBC: 3.51 10*6/uL — ABNORMAL LOW (ref 3.80–5.20)
RDW: 16.7 % — ABNORMAL HIGH (ref 11.5–14.5)
WBC: 19.2 10*3/uL — AB (ref 3.6–11.0)

## 2014-06-22 LAB — BASIC METABOLIC PANEL
Anion Gap: 5 — ABNORMAL LOW (ref 7–16)
BUN: 13 mg/dL (ref 7–18)
Calcium, Total: 8.8 mg/dL (ref 8.5–10.1)
Chloride: 97 mmol/L — ABNORMAL LOW (ref 98–107)
Co2: 36 mmol/L — ABNORMAL HIGH (ref 21–32)
Creatinine: 1.21 mg/dL (ref 0.60–1.30)
EGFR (African American): 58 — ABNORMAL LOW
EGFR (Non-African Amer.): 48 — ABNORMAL LOW
Glucose: 82 mg/dL (ref 65–99)
Osmolality: 275 (ref 275–301)
Potassium: 4 mmol/L (ref 3.5–5.1)
Sodium: 138 mmol/L (ref 136–145)

## 2014-06-23 LAB — BASIC METABOLIC PANEL
Anion Gap: 5 — ABNORMAL LOW (ref 7–16)
BUN: 12 mg/dL (ref 7–18)
CHLORIDE: 97 mmol/L — AB (ref 98–107)
Calcium, Total: 9.2 mg/dL (ref 8.5–10.1)
Co2: 36 mmol/L — ABNORMAL HIGH (ref 21–32)
Creatinine: 1.19 mg/dL (ref 0.60–1.30)
EGFR (African American): 59 — ABNORMAL LOW
EGFR (Non-African Amer.): 49 — ABNORMAL LOW
GLUCOSE: 109 mg/dL — AB (ref 65–99)
Osmolality: 276 (ref 275–301)
Potassium: 4.3 mmol/L (ref 3.5–5.1)
SODIUM: 138 mmol/L (ref 136–145)

## 2014-06-24 LAB — CBC WITH DIFFERENTIAL/PLATELET
BASOS ABS: 0.1 10*3/uL (ref 0.0–0.1)
Basophil %: 0.5 %
EOS PCT: 2.8 %
Eosinophil #: 0.5 10*3/uL (ref 0.0–0.7)
HCT: 30.3 % — ABNORMAL LOW (ref 35.0–47.0)
HGB: 9.8 g/dL — AB (ref 12.0–16.0)
Lymphocyte #: 2.3 10*3/uL (ref 1.0–3.6)
Lymphocyte %: 13.2 %
MCH: 27.4 pg (ref 26.0–34.0)
MCHC: 32.2 g/dL (ref 32.0–36.0)
MCV: 85 fL (ref 80–100)
MONOS PCT: 8.4 %
Monocyte #: 1.5 x10 3/mm — ABNORMAL HIGH (ref 0.2–0.9)
Neutrophil #: 13.3 10*3/uL — ABNORMAL HIGH (ref 1.4–6.5)
Neutrophil %: 75.1 %
Platelet: 319 10*3/uL (ref 150–440)
RBC: 3.57 10*6/uL — ABNORMAL LOW (ref 3.80–5.20)
RDW: 17.7 % — AB (ref 11.5–14.5)
WBC: 17.7 10*3/uL — ABNORMAL HIGH (ref 3.6–11.0)

## 2014-06-24 LAB — PROTIME-INR
INR: 1
Prothrombin Time: 13.5 secs (ref 11.5–14.7)

## 2014-06-24 LAB — BASIC METABOLIC PANEL
Anion Gap: 6 — ABNORMAL LOW (ref 7–16)
BUN: 14 mg/dL (ref 7–18)
CHLORIDE: 100 mmol/L (ref 98–107)
CO2: 34 mmol/L — AB (ref 21–32)
Calcium, Total: 8.8 mg/dL (ref 8.5–10.1)
Creatinine: 1.15 mg/dL (ref 0.60–1.30)
EGFR (African American): >60
GFR CALC NON AF AMER: 50 — AB
Glucose: 83 mg/dL (ref 65–99)
Osmolality: 279 (ref 275–301)
Potassium: 3.8 mmol/L (ref 3.5–5.1)
Sodium: 140 mmol/L (ref 136–145)

## 2014-07-15 LAB — CULTURE, FUNGUS WITHOUT SMEAR

## 2014-12-18 NOTE — H&P (Signed)
PATIENT NAME:  Audrey Sexton, Audrey Sexton MR#:  [id number] DATE OF BIRTH:  12-Nov-1950  DATE OF ADMISSION:  08/16/2012  PRIMARY CARE PHYSICIAN: Dr. Toy Cookey  CHIEF COMPLAINT: Increased shortness of breath.   HISTORY OF PRESENT ILLNESS: Audrey Sexton is a very nice 64 year old female who has history of COPD, chronic respiratory failure on 2 liters home oxygen. The patient states that about 4 days ago she started having a cough and a lot of congestion with increase in sputum. Her sputum turned from clear to green within 24 hours, and she developed a fever or 104 today. Apparently everything started after she was in contact with her granddaughter, who has the same symptoms, likely a viral infection. The patient started having significant shortness of breath today, gasping for air, with significant respiratory distress for which she decided to come to the ER. The patient states that the fever has been as high as 104.0. She had some chills and felt pretty cold all over. The patient  overall has been in a good state of health. She has been out of hospital for over 2 years and has not had her flu shot this season.   REVIEW OF SYSTEMS:   CONSTITUTIONAL: The patient  has fever, denies any significant weakness or pain.  EYES: Denies any change in her vision or blurry vision.  ENT: No difficulty swallowing. No postnasal drip. No nasal discharge.  RESPIRATORY: Positive cough. Positive wheezing. Positive dyspnea. Negative hemoptysis. Negative painful respirations.  CARDIOVASCULAR: No chest pain. No orthopnea. No arrhythmias.  GASTROINTESTINAL: No nausea or vomiting. No abdominal pain. No constipation or diarrhea.  GENITOURINARY: Positive incontinence. Negative polyuria. Negative increased frequency or dysuria.  GYNECOLOGICAL: No breast masses. No vaginal bleeding.  ENDOCRINE: No polyuria, polydipsia, or polyphagia. No thyroid problems. No cold or heat intolerance.  SKIN: Without any rashes or petechiae.   HEMATOLOGIC/LYMPHATIC:  No anemia or easy bruising.  NEUROLOGIC: No numbness, tingling. No CVA. No transient ischemic attack.  MUSCULOSKELETAL: No significant joint effusions or pain in joints.  PSYCHIATRIC: No insomnia or depression.   PAST MEDICAL HISTORY: 1. Asthma.  2. Chronic obstructive pulmonary disease diagnosed 6 years ago.  3. Hyperlipidemia.  4. Hypertension.  5. Seasonal allergies.  6. Bladder incontinence.  7. Gastroesophageal reflux.  8. Osteoporosis.   ALLERGIES: THE PATIENT IS ALLERGIC TO ASPIRIN, PENICILLIN, PERCOCET, OXYCODONE, IBUPROFEN, EXCEDRIN AND HYDROCODONE.   PAST SURGICAL HISTORY:  1. Knee surgery for meniscus and rotator cuff repair of the left side.  2. Total shoulder replacement 2007.   SOCIAL HISTORY: The patient lives by herself. Her daughter helps with her care. She quit smoking about 5 years ago. She used to  smoke 1 pack a day or more for the past 50 years. She denies any alcohol or drug abuse.   FAMILY HISTORY: Negative for coronary artery disease. Negative for cancer.   CURRENT MEDICATIONS: Vitamin E 400 twice daily, Tylenol p.r.n., hydrochlorothiazide and triamterene 37.5/25 mg once a day, Spiriva 18 mcg daily, ProAir inhaler, paroxetine 20 mg once daily, Oxybutynin 5 mg once a day, omeprazole 20 mg once a day, multivitamins once daily, meloxicam 7.5 mg 2 tablets once a day, Lipitor 20 mg once a day, ipratropium 0.02 nebulizers, Evista 60 mg once a day, diltiazem 240 mg once a day, calcium 2 tablets daily, Albuterol/Atrovent nebs 4 times a day p.r.n., Advair Diskus 500/50, 1 puff twice daily.   PHYSICAL EXAMINATION: VITAL SIGNS: Blood pressure 145/68, pulse 118 to 111, temperature 99.6, temperature at home 100.4, respiratory rate  up to 32, oxygen saturation 82% on room air.  GENERAL: The patient is alert, oriented x 3. Positive respiratory distress. Use of accessory muscles and significant agitation due to the shortness of breath. The patient is  sitting at the side of the chair trying to gasp for some air.  She is on oxygen via nasal cannula.  HEENT: Her pupils are equal and reactive. Extraocular movements are intact. Mucosa are dry. No oral exudates. Positive leukoplakia of the tongue. No oral lesions.  NECK: Supple. No JVD. No thyromegaly. No adenopathy. No carotid bruits. Normal range of motion.  CARDIOVASCULAR: Regular rate and rhythm, slightly tachycardic. No displacement of PMI. No murmurs, rubs, or gallops.  PULMONARY: Very tight with significant wheezing, decreased air entrance on both fields, use of accessory muscles. No dullness to percussion. Positive rhonchi.  ABDOMEN: Soft, nontender, nondistended. No hepatosplenomegaly. No masses. Bowel sounds are positive.  EXTREMITIES: No edema, no cyanosis, no clubbing.  Pulses +2, capillary refill less than 3.0.  GENITAL: Deferred.  NEUROLOGICAL: Cranial nerves II through XII are intact. No focal findings.  PSYCHIATRIC: Negative anxiety, negative agitation, alert and oriented x 3.  LYMPHATICS: Negative for lymphadenopathy in neck or supraclavicular areas.  SKIN: Without any rashes or petechiae, but she has significant scratches of her back. She has been scratching due to itching.  MUSCULOSKELETAL: No significant muscle abnormalities or joint effusions.   LABORATORY, DIAGNOSTIC AND RADIOLOGICAL DATA:   Glucose 107, creatinine 1.59, potassium 3.3, albumin is 3.1. White blood count is 13.9, hemoglobin of 14. Influenza swab is negative.    Chest x-ray: No acute process although there might be a forming consolidation on the left costovertebral angle.   ASSESSMENT AND PLAN: This is a 64 year old female with significant history of COPD, asthma, hypertension, hyperlipidemia, allergies, bladder incontinence, GERD, who comes with increased shortness of breath, cough and sputum.   1. Acute on chronic respiratory failure: The patient is on oxygen at home, about 2 to 3 liters; and with 2 or 3  liters she was saturating 82% for which she had increase in her demand of oxygen. We are going to put her on high-flow oxygen, consider BiPAP, if necessary. THE PATIENT IS FULL CODE, AND SHE IS OPEN TO INTUBATION, IF NECESSARY. She is wheezing a lot and significantly has finding of decreased air entrance. Chest x-ray might or might not have a new developing of consolidation for which is presumed that the patient might have new pneumonia. She has increased white blood cells, increased respiratory rate and fever for which we are going to treat her with Levaquin for community-acquired pneumonia.  2. SIRS: Likely due to respiratory infection, possibly pneumonia versus chronic obstructive pulmonary disease exacerbation. We will continue with treatment with Levaquin for now.  3. COPD exacerbation versus pneumonia: See above.  4. Dehydration: The patient looks very dry. We are going to give her some IV fluids with potassium replacement due to hypokalemia.  5. Acute kidney injury: The patient's creatinine is 1.5. We are going to give her fluids, follow up in the morning.  6. Hypertension: Continue blood pressure medications with diltiazem. If there is drop of her blood pressure, we are going to stop that medication to control renal flow and avoid ATN. The patient is taking hydrochlorothiazide and triamterene that we are going to hold until kidney function improves.  7. Deep vein thrombosis prophylaxis: Heparin.  8. GI prophylaxis: With PPI due to steroids. The patient is going to be on high-dose steroids. She will require  a very slow taper.  9. Other issues are stable.  CODE STATUS:  The patient is a FULL CODE.    TIME SPENT: I spent about 45 minutes with this admission.  ____________________________ Felipa Furnaceoberto Sanchez Gutierrez, MD rsg:cb D: 08/16/2012 22:13:42 ET T: 08/16/2012 23:07:32 ET JOB#: [id number]  cc: Felipa Furnaceoberto Sanchez Gutierrez, MD, <Dictator> Marily Konczal Juanda ChanceSANCHEZ GUTIERRE MD ELECTRONICALLY SIGNED  08/29/2012 7:41

## 2014-12-18 NOTE — Discharge Summary (Signed)
PATIENT NAME:  Audrey Sexton, Audrey Sexton MR#:  [id number] DATE OF BIRTH:  September 07, 1950  DATE OF ADMISSION:  08/16/2012  DATE OF DISCHARGE:  08/23/2012  PRIMARY CARE PHYSICIAN: Dr. Maryellen Sexton.   CONSULTATIONS: Dr. Dorris Sexton.   DISCHARGE DIAGNOSES:  1. Chronic obstructive pulmonary disease exacerbation, acute on chronic respiratory failure.  2. SIRS.  3. Acute renal failure on chronic kidney disease.  4. Hypertension.   CODE STATUS: FULL CODE.   CONDITION: Stable.   MEDICATIONS: [facility]  DISCHARGE INSTRUCTIONS: Stop medication, meloxicam and hydrochlorothiazide/triamterene due to renal failure. The patient also needs Home Health due to COPD and also need home oxygen 3 L by nasal cannula.   DIET: Low sodium, low fat, low cholesterol, ADA diet.   ACTIVITY: As tolerated.   FOLLOW-UP CARE: Follow-up with PCP within one week.   REASON FOR ADMISSION: Shortness of breath.   HOSPITAL COURSE: The patient is a 64 year old female with a history of COPD, chronic respiratory failure, on 4 L home oxygen who presented to the ED with increasing shortness of breath and congestion for 4 days with a cough, sputum, and a fever of 104. For detailed history and physical examination, please refer to the admission note dictated by Dr. Mordecai Sexton. On admission date, the patient's chest x-ray showed no acute process. WBC 13.9, hemoglobin 14, glucose 107, creatinine 1.59, potassium 3.3. The patient was admitted for acute on chronic respiratory failure and COPD exacerbation, and after admission, the patient has been treated with O2 by nasal cannula, about 5 to 6 L initially then taper down to 3 L today. In addition, the patient has been treated with Solu-Medrol IV and also Levaquin and nebulizer p.r.n. The patient continuously had shortness of breath, cough, sputum, but the symptoms have been improving for the past 2 days, so IV steroid was discontinued, and the patient was  started with the prednisone p.o. since yesterday. The patient has a SIRS which has improved. She was suspected to have  pneumonia but clinically there is no sign of pneumonia. The patient has been treated with COPD exacerbation.   For dehydration and acute renal failure with CKD. The patient was treated with IV fluid support. However, the patient developed respiratory distress. She was treated. A chest x-ray shows some opacity so the patient was treated with Lasix and the IV fluid was discontinue. The patient's symptoms got better after Lasix and creatinine decreased to 1.5.   Hypertension has been controlled with hypertension medication. The patient is clinically stable and will be discharged to home with Home Health and home oxygen, 3 L by nasal cannula. I discussed the patient's discharge plan with the patient and the case manager.   TIME SPENT: About 38 minutes.    ____________________________ Audrey PollackQing Keyauna Graefe, MD qc:jm D: 08/23/2012 12:32:59 ET T: 08/24/2012 16:08:24 ET JOB#: [id number]  cc: Audrey PollackQing Alic Hilburn, MD, <Dictator> Audrey PollackQING Jeaneane Adamec MD ELECTRONICALLY SIGNED 08/31/2012 18:09

## 2014-12-19 NOTE — H&P (Signed)
PATIENT NAME:  Audrey Sexton, Audrey Sexton MR#:  [id number] DATE OF BIRTH:  October 24, 1950  DATE OF ADMISSION:  06/13/2014  PRIMARY CARE PHYSICIAN: Dr. Maryellen Pile.   CHIEF COMPLAINT: Right-sided chest pain and shortness of breath.   HISTORY OF PRESENT ILLNESS: This is a 64 year old female who presents to the hospital complaining of a 2 to 3 day history of worsening shortness of breath and right-sided chest pain. She describes the pain as being sharp in nature, located in the right side of her chest, and worse with deep inspiration. The patient was recently in the hospital about 2 weeks ago for a pneumonia and discharged on oral prednisone and Levaquin. She was doing better until the past 2 days. She has started to feel worse. She came to the ER for further evaluation and was noted to be hypoxic and a chest x-ray showed a right-sided multilobar pneumonia. Hospitalist services were contacted for further treatment and evaluation.   REVIEW OF SYSTEMS:  CONSTITUTIONAL: No documented fever. No weight gain or weight loss.  EYES: No blurred or double vision.  ENT: No tinnitus. No postnasal drip. No redness of the oropharynx.  RESPIRATORY: Positive cough. No wheeze. No hemoptysis. Positive COPD, positive dyspnea.  CARDIOVASCULAR: Positive right-sided chest pain. No orthopnea, no palpitation, no syncope.  GASTROINTESTINAL: No nausea, no vomiting, diarrhea. No abdominal pain. No melena or hematochezia.  GENITOURINARY: No dysuria or hematuria.  ENDOCRINE: No polyuria or nocturia. No heat or cold intolerance.  HEMATOLOGY: No anemia, no bruising, no bleeding.  INTEGUMENTARY: No rashes or lesions.  MUSCULOSKELETAL: No arthritis. No swelling, no gout.  NEUROLOGIC: No numbness, tingling. No ataxia. No seizure-type activity.  PSYCHIATRIC: Positive anxiety. No insomnia. No ADD.   PAST MEDICAL HISTORY: Consistent with COPD, on home oxygen, GERD, urinary incontinence, anxiety, hyperlipidemia, diabetes, osteoporosis.   ALLERGIES:  ASPIRIN, EXCEDRIN, HYDROCODONE, IBUPROFEN, OXYCODONE, PENICILLIN, PERCOCET.   SOCIAL HISTORY: Used to be a smoker, but has not smoked in the past 5 years. No alcohol abuse. No illicit drug abuse. Lives at home by herself.   FAMILY HISTORY: Mother and father are both deceased. Mother died from complications of diabetes. Father died from Alzheimer dementia.   CURRENT MEDICATIONS: As follows: Tylenol 500 mg 2 tabs q. 6 hours as needed, albuterol nebulizer 4 times daily as needed, atorvastatin 40 mg at bedtime, Breo Ellipta 100/25 one puff daily, calcium with vitamin D 1 tab b.i.d., Cardizem CD 240 mg daily, Lasix 40 mg daily, hydrocortisone topical cream to be applied to the affected area 2 to 4 times daily as needed, ipratropium inhaled nebulizer b.i.d. as needed, meloxicam 15 mg daily, omeprazole 20 mg daily, multivitamin daily, oxybutynin 5 mg daily, paroxetine 20 mg daily, albuterol inhaler 2 puffs 4 times daily as needed, Spiriva 1 puff daily, vitamin B12 at 1000 mcg 2 tabs daily, vitamin C 1000 mg daily, vitamin D3 at 1000 international units daily, vitamin E 200 international units daily.   PHYSICAL EXAMINATION: Presently is as follows:  VITAL SIGNS: Temperature 98.1, pulse 127, respirations 27, blood pressure 107/60, sats 92% on 4 liters nasal cannula.  GENERAL: She is a pleasant-appearing female in mild respiratory distress.  HEAD, EYES, EARS, NOSE AND THROAT: Atraumatic, normocephalic. Extraocular muscles are intact. Pupils equal and reactive on to light. Sclerae anicteric. No conjunctival injection. No pharyngeal erythema.  NECK: Supple. There is no jugular venous distention. No bruits, no lymphadenopathy, no thyromegaly.  HEART: Regular rate and rhythm, tachycardic. No murmurs, no rubs, no clicks.  LUNGS: Poor inspiratory and expiratory effort.  Negative use of accessory muscles. No dullness to percussion.  ABDOMEN: Soft, flat, nontender, nondistended. Has good bowel sounds. No  hepatosplenomegaly appreciated.  EXTREMITIES: No evidence of any cyanosis, clubbing, or peripheral edema. Has +2 pedal and radial pulses bilaterally.  NEUROLOGIC: The patient is alert, awake, and oriented x 3 with no focal motor or sensory deficits appreciated bilaterally.  SKIN: Moist and warm with no rashes appreciated.  LYMPHATIC: There is no cervical or axillary lymphadenopathy.   LABORATORY DATA: Serum glucose 148, BUN 17, creatinine 1.6, sodium 139, potassium 3.3, chloride 99, bicarbonate 29, albumin is 2.0. White cell count is 23.2, hemoglobin 10.4, hematocrit 32.7, platelet count 492,000. INR is 1.2.   The patient did have a chest x-ray done which showed a worsening right middle lobe and right upper lobe pneumonia.   ASSESSMENT AND PLAN: This is a 64 year old female with a history of chronic obstructive pulmonary disease on home oxygen, recent admission for pneumonia with hemoptysis, gastroesophageal reflux disease, urinary incontinence, anxiety, hyperlipidemia, diabetes, who presented to the hospital with right-sided chest pain and shortness of breath and noted to have a right-sided multilobar pneumonia.  1.  Pneumonia. This is likely nosocomial pneumonia as the patient was recently in the hospital about 2 weeks ago. I will start the patient on IV vancomycin, aztreonam, and Levaquin. Follow sputum and blood cultures. Follow clinically. Wean off oxygen as tolerated.  2.  Acute on chronic hypoxic respiratory failure. This is secondary to the pneumonia with underlying chronic obstructive pulmonary disease. Continue IV antibiotics as mentioned above and oxygen support. Will get a CT chest to rule out a possible underlying lung mass. The patient had a recent bronchoscopy and brushings and biopsy were negative for malignant cells, although I will go ahead and get a pulmonary consult.  3.  Hypertension. The patient is presently hemodynamically stable. Continue Cardizem.  4.  Gastroesophageal reflux  disease. Continue Protonix.  5.  History of chronic obstructive pulmonary disease. No acute exacerbation. Continue with the Breo Ellipta and continue Spiriva.  6.  Anxiety. Continue Paxil.  7.  Urinary incontinence. Continue oxybutynin.  8.  Hyperlipidemia. Continue atorvastatin.   CODE STATUS: The patient is a full code.    TIME SPENT: 50 minutes.    ____________________________ Rolly PancakeVivek J. Cherlynn KaiserSainani, MD vjs:at D: 06/13/2014 17:39:27 ET T: 06/13/2014 19:43:28 ET JOB#: [id number]  cc: Rolly PancakeVivek J. Cherlynn KaiserSainani, MD, <Dictator> Houston SirenVIVEK J Evee Liska MD ELECTRONICALLY SIGNED 07/06/2014 11:08

## 2014-12-19 NOTE — Consult Note (Signed)
PATIENT NAME:  Audrey Sexton, Audrey Sexton MR#:  [id number] DATE OF BIRTH:  [birth year]  DATE OF CONSULTATION:  06/19/2014  REFERRING PHYSICIAN:  Epifanio Lesches, MD CONSULTING PHYSICIAN:  Cheral Marker. Ola Spurr, MD  REASON FOR CONSULTATION: Recurrent pneumonia.  HISTORY OF PRESENT ILLNESS: This is a 64 year old female with history of COPD, on home O2 since 2009, who has had several admissions this year for recurrent pneumonia. She was admitted this course October 17th with shortness of breath and right-sided chest pain. She had been admitted 2 weeks prior and was discharged about that time on oral prednisone and levofloxacin. She also had a bronchoscopy done at that time to evaluate for possible malignancy, but cytology was negative. Since admission the patient was started on broad-spectrum antibiotics including vancomycin, aztreonam and levofloxacin. She has had blood cultures negative, but sputum cultures are growing sensitive Staphylococcus aureus. We are consulted for further antibiotic management.   PAST MEDICAL HISTORY: 1.  COPD, on home O2, multiple admissions. She has been on O2 since 2009. 2.  GERD. 3.  Urinary incontinence.  4.  Hyperlipidemia.  5.  Diabetes.  6.  Anxiety.  7.  Osteoporosis.   SOCIAL HISTORY: She quit smoking 5 years ago after smoking a pack a day for 50 years. She does not drink alcohol. She lives by herself.   FAMILY HISTORY: Noncontributory.   ALLERGIES: ASPIRIN, EXCEDRIN, HYDROCODONE, IBUPROFEN, OXYCODONE, PENICILLIN AND PERCOCET.   REVIEW OF SYSTEMS: Eleven systems reviewed and negative except as per HPI.  PHYSICAL EXAMINATION: VITAL SIGNS: Temperature 98.9, pulse 92, blood pressure 104/66, respirations 22, sat 91% on 3 liters.  GENERAL: She is quite disheveled. She is sitting in bed on oxygen. She is in no respiratory distress, but does cough with exertion. She has been afebrile for the last several days. Pulse 118, blood pressure 96/64, respirations 20, sat 94%   HEENT: Pupils equal, round, and reactive to light and accommodation. Extraocular movements are intact. Sclera anicteric. Oropharynx has no thrush but she has very poor dentition.  NECK: Supple.  HEART: Regular but tachy.  LUNGS: Coarse breath sounds bilateral.  ABDOMEN: Obese, soft, nontender.  EXTREMITIES: No clubbing, cyanosis or edema.  NEUROLOGIC: She is alert and oriented x3. Grossly nonfocal neuro exam.   DIAGNOSTIC DATA: Microbiology, October 18th: Sputum cultures, light growth of Staphylococcus aureus, sensitive to clindamycin, oxacillin, Cipro, gent, erythromycin, levofloxacin, linezolid, tigecycline, and Bactrim. Blood cultures October 17th: No growth to date. Blood culture September 24th: Negative. Pathology: The patient had cytology from September 29th with a fine needle aspiration that showed negative for malignant cells. Bronchial washings September 29th showed also negative for malignant cells. ANA October 18th was negative. ANCA negative. Antiglomerular basement membrane negative. White blood count currently is 21, hemoglobin 9.4, platelets 559,000. White blood count on admission, October 17th, was 23.2. Sedimentation rate is greater than 140. Renal function shows creatinine 1.35, albumin quite low, at one point 8.  Imaging: CT of the chest, October 17th, showed extensive dense pneumonia in the inferior aspect of the right upper lobe, also shows mass effect on the minor fissure. There is left lower lobe and right middle lobe pulmonary nodules, as described above. These are nonspecific. There are extensive changes of COPD. There is minimal right pleural effusion. There is a large calcified posterior disk herniation at T7 and T8. This is causing moderate to marked canal stenosis on the left.   IMPRESSION: A 64 year old female with history of extensive chronic obstructive pulmonary disease, on home oxygen for the last 6  years, admitted with recurrent pneumonia. She has extensive disease on  her CT. She is growing methicillin sensitive Staphylococcus aureus. She continues to cough quite a bit. She has had a bronchoscopy with cytology done, but no AFB check or cultures at that time. She does continue to have persistent leukocytosis. She also has a markedly elevated ESR greater than 140.   I suspect she just has progressive destructive pneumonia potentially from the Staphylococcus aureus with underlying very poor lung disease. She could also potentially have Mycobacterium avium-intracellulare, but I think tuberculosis is very unlikely. She is allergic to penicillin. Her Staphylococcus aureus is quite a sensitive organism so we have multiple antibiotics to choose from. She has quite poor dentition which also could lead to aspiration and severe necrotizing pneumonia.   RECOMMENDATION: 1.  At this point, I would suggest consolidating her antibiotics to clindamycin. This will provide great coverage of the Staphylococcus aureus as well as coverage of anaerobes. No other organisms have been isolated that I can see. There is mention of Klebsiella pneumoniae, but I do not see any cultures with that.  2.  I would suggest continue pulmonary care including flutter valve.  3.  She would need at least a 21 day course of treatment for the Staphylococcus aureus pneumonia. This would give her a stop date of November 9th.  4.  If she progresses, I would suggest repeating cultures.   Thank you for the consult. I will be glad to follow with you.  ____________________________ Cheral Marker. Ola Spurr, MD dpf:sb D: 06/19/2014 15:44:00 ET T: 06/19/2014 16:39:41 ET JOB#: [id number]  cc: Cheral Marker. Ola Spurr, MD, <Dictator> Arney Mayabb Ola Spurr MD ELECTRONICALLY SIGNED 06/23/2014 9:55

## 2014-12-19 NOTE — Consult Note (Signed)
Chief Complaint:  Subjective/Chief Complaint doing about the same xray results noted   VITAL SIGNS/ANCILLARY NOTES: **Vital Signs.:   27-Oct-15 08:20  Vital Signs Type Routine  Temperature Temperature (F) 98.5  Celsius 36.9  Temperature Source oral  Respirations Respirations 20  Systolic BP Systolic BP 703  Diastolic BP (mmHg) Diastolic BP (mmHg) 56  Mean BP 74  Pulse Ox % Pulse Ox % 91  Pulse Ox Activity Level  At rest  Oxygen Delivery 3L  *Intake and Output.:   Daily 27-Oct-15 07:00  Grand Totals Intake:  840 Output:  2000    Net:  -1160 24 Hr.:  -1160  Oral Intake      In:  840  Urine ml     Out:  2000  Length of Stay Totals Intake:  10481 Output:  11700    Net:  -1219   Brief Assessment:  GEN no acute distress   Cardiac Regular  -- LE edema   Respiratory normal resp effort  clear BS  no use of accessory muscles   Gastrointestinal details normal Soft  Nontender  No gaurding   EXTR negative cyanosis/clubbing, negative edema   Lab Results: Routine Chem:  27-Oct-15 08:36   Glucose, Serum  109  BUN 12  Creatinine (comp) 1.19  Sodium, Serum 138  Potassium, Serum 4.3  Chloride, Serum  97  CO2, Serum  36  Calcium (Total), Serum 9.2  Anion Gap  5  Osmolality (calc) 276  eGFR (African American)  59  eGFR (Non-African American)  49 (eGFR values <1m/min/1.73 m2 may be an indication of chronic kidney disease (CKD). Calculated eGFR, using the MRDR Study equation, is useful in  patients with stable renal function. The eGFR calculation will not be reliable in acutely ill patients when serum creatinine is changing rapidly. It is not useful in patients on dialysis. The eGFR calculation may not be applicable to patients at the low and high extremes of body sizes, pregnant women, and vetetarians.)   Radiology Results: XRay:    27-Oct-15 09:21, Chest PA and Lateral  Chest PA and Lateral   REASON FOR EXAM:    pneumonia  COMMENTS:       PROCEDURE: DXR - DXR  CHEST PA (OR AP) AND LATERAL  - Jun 23 2014  9:21AM     CLINICAL DATA:  Short of breath. History of COPD. Former smoker.  Recent diagnosis of pneumonia.    EXAM:  CHEST  2 VIEW    COMPARISON:  Chest CT, 06/13/2014. Chest radiographs, 06/13/2014 and  05/21/2014.    FINDINGS:  Dense consolidation persists in the inferior anterior right upper  lobe extending to the superior right middle lobe. This is more focal  than on the prior chest radiograph, which may be difference in  technique only. This area of consolidation has increased in size  when compared to the chest radiograph dated 05/21/2014.    There is a smaller area of opacity in the left lung base which may  reflect additional infiltrate or atelectasis. Mild consolidations  also noted in the posterior right upper lobe adjacent to the oblique  fissure.    No pleural effusion.    Cardiac silhouette is normal in size. No mediastinal masses. No left  hilar mass or evidenceof adenopathy. Right hilum is partly obscured  by the contiguous right upper lobe consolidation.    Bony thorax is demineralized but grossly intact.     IMPRESSION:  1. Right upper lobe consolidation, lying anteriorly and inferiorly  adjacent to the minor fissure. Consolidation also crosses the minor  fissure to involve the superior right middle lobe. There is a small  area of consolidation in the posterior inferior right upper lobe.  This has increased in extent when compared back to May 21, 2014. The persistence of this consolidation raises concern for  neoplastic disease. This could be from an antibiotic resistant  bacteria etiology. Biopsy/bronchoscopy should be considered.    Electronically Signed    By: Lajean Manes M.D.    On: 06/23/2014 09:36         Verified By: Lasandra Beech, M.D.,   Assessment/Plan:  Assessment/Plan:  Assessment 1. persitant pneumonia -will get a CT scan -continue antibiotics -will do a bronchoscopy in am    Electronic Signatures: Allyne Gee (MD)  (Signed 27-Oct-15 11:30)  Authored: Chief Complaint, VITAL SIGNS/ANCILLARY NOTES, Brief Assessment, Lab Results, Radiology Results, Assessment/Plan   Last Updated: 27-Oct-15 11:30 by Allyne Gee (MD)

## 2014-12-19 NOTE — H&P (Signed)
PATIENT NAME:  Audrey Sexton, Audrey MR#:  [id number] DATE OF BIRTH:  1951/05/26  DATE OF ADMISSION:  05/21/2014  PRIMARY CARE PHYSICIAN: Dr. Maryellen PileEason  CHIEF COMPLAINT: Hemoptysis.   HISTORY OF PRESENT ILLNESS: This is a 64 year old female who was seen in the ER approximally a week ago with diagnosis of pneumonia, treated with Levaquin for 5 days, presents today with hemoptysis. The patient says over the past day she has been coughing up blood and she has some mild wheezing. In the ER, she was noted to have hypoxia. She says that she wears oxygen p.r.n.   REVIEW OF SYSTEMS: CONSTITUTIONAL: No fever. Positive chills. No fatigue, weakness.  EYES: No blurred or double vision.  ENT: No tinnitus, ear pain, hearing loss. RESPIRATORY: Positive cough, positive wheezing, positive history of COPD.  CARDIOVASCULAR: Denied any chest pain, palpitations, orthopnea, edema, arrhythmia, dyspnea on exertion.  GASTROINTESTINAL: No nausea, vomiting, diarrhea, abdominal pain, melena, or ulcers. GENITOURINARY: No dysuria or hematuria. ENDOCRINE: No polyuria or polydipsia. HEME AND LYMPH: No bleeding, swollen glands. MUSCULOSKELETAL: No pain the shoulders or knees. NEUROLOGIC: No history of CVA, TIA.  PSYCHIATRIC: No history of anxiety or depression.  PAST MEDICAL HISTORY: 1.  COPD with p.r.n. O2. 2.  Arthritis.  3.  Asthma.  4.  Hypertension.   PAST SURGICAL HISTORY: 1.  Tonsillectomy. 2.  Tubal ligation.  3.  Left shoulder arthroscopy. 4.  Left hand to straighten finger.  5.  Colon polyps.  6.  Left leg tibia surgery.   MEDICATIONS: 1.  Acetaminophen 500 mg 2 tablets q. 6 hours p.r.n. pain.  2.  Albuterol 3 mL 1 to 4 times a day.  3.  Atorvastatin 40 mg at bedtime.  4.  Ellipta 100/25 one puff daily.  5.  Calcium plus D 1 tablet b.i.d.  6.  Cartia 240 mg daily.  7.  Lasix 40 mg daily.  8.  Hydrocortisone topical b.i.d. to effected area.  9.  Ipratropium 2.5 inhaled 1 to 2 times a day.  10.  Meloxicam  15 mg daily.  11.  Omeprazole 20 mg daily.  12.  One-A-Day multivitamin daily.  13.  Oxybutynin 5 mg daily.  14.  Paxil 20 mg daily.  15.  ProAir HFA 2 puffs 1 to 4 times a day.  16.  Spiriva 18 mcg daily.  17.  Vitamin B12 1,000 mcg 2 tablets daily.  18.  Vitamin C 1000 mg daily.  19.  Vitamin D3 1000 international units daily.  20.  Vitamin E 200 international units daily.  ALLERGIES:  1.  ASPIRIN - GI distress.  2.  EXCEDRIN - GI Distress.  3.  HYDROCODONE - Nausea and vomiting.   4.  IBUPROFEN - GI distress.  5.  OXYCODONE - Nausea and vomiting. 6.  PENICILLIN - Hives. 7.  PERCOCET - Nausea and vomiting.  SOCIAL HISTORY: The patient quit smoking about 5 years ago. No alcohol or IV drug use.   FAMILY HISTORY: Positive for diabetes, Alzheimer dementia.   PHYSICAL EXAMINATION: VITAL SIGNS: Temperature 98.7, pulse 100, respirations 24, blood pressure 136/76, 95% on 2 liters.  GENERAL: The patient is alert and oriented and does not appear to be in acute distress, not using accessory muscles. HEENT: Head is atraumatic. Pupils are round and reactive. Sclerae anicteric. Mucous membranes are moist. Oropharynx is clear. The patient has very poor dentition.  HEART: Regular rate and rhythm. No murmurs, gallops, or rubs. PMI is not displaced.  LUNGS: She has decreased breath sounds throughout with  some mild crackles at the right middle lobe with some very minimal wheezing with fair air flow.  BACK: No CVA or vertebral tenderness.  ABDOMEN: Bowel sounds are positive, nontender, nondistended. No hepatosplenomegaly. No rebound or guarding.  EXTREMITIES: No clubbing, cyanosis or edema.  NEUROLOGIC: Cranial nerves II through XII are intact. There are no focal deficits. SKIN: Without rash or lesions.  DIAGNOSTIC DATA: BNP 301. CK 108, CK-MB 0.8. White blood cells 17.4, hemoglobin 12, hematocrit 39, platelets 307,000. Sodium 136, potassium 4.1, chloride 100, bicarb 28, BUN 13, creatinine 1.17,  glucose 130. ALT 13, AST 38, total protein 7.7, albumin 2.6. Troponin less than 0.02.   Chest x-ray shows increased right middle lobe infiltrate with emphysema.   EKG: Sinus tachycardia. No ST elevations or depressions.   ASSESSMENT AND PLAN: This is a 64 year old female recently treated for community-acquired pneumonia, on Levaquin, 750 mg for 5 days, who presents with hemoptysis.  1.  Hemoptysis with chest x-ray concerning for increasing right middle lobe pneumonia. The patient's pneumonia may not have been adequately treated or she may have an underlying lung issue. For now I have ordered a pulmonary consultation. I have placed her on broad-spectrum antibiotics including aztreonam. Blood and sputum cultures have been ordered, also order a CT of the chest to further evaluate the pneumonia and hemoptysis and to rule out a mass.  2.  Community-acquired pneumonia which failed outpatient therapy. Will try broader spectrum antibiotic.  3.  Hypertension. Continue Cartia. 4.  History of chronic obstructive pulmonary disease with mild wheezing. I have placed the patient on some IV steroids, DuoNebs and will continue inhalers.  5.  Arthritis. Will use p.r.n. Tylenol.   CODE STATUS: The patient is a FULL CODE status.  TIME SPENT: Approximately 40 minutes.   ____________________________ Janyth Contes. Juliene Pina, MD spm:sb D: 05/21/2014 08:12:01 ET T: 05/21/2014 08:38:37 ET JOB#: [id number]  cc: Aleksandar Duve P. Juliene Pina, MD, <Dictator> Serita Sheller. Maryellen Pile, MD Janyth Contes Krystalle Pilkington MD ELECTRONICALLY SIGNED 05/21/2014 13:09

## 2014-12-19 NOTE — Consult Note (Signed)
Chief Complaint:  Subjective/Chief Complaint patient still with some cough and has some sob   VITAL SIGNS/ANCILLARY NOTES: **Vital Signs.:   26-Oct-15 13:03  Vital Signs Type Routine  Temperature Temperature (F) 98.4  Celsius 36.8  Temperature Source oral  Pulse Pulse 100  Respirations Respirations 20  Systolic BP Systolic BP 021  Diastolic BP (mmHg) Diastolic BP (mmHg) 66  Mean BP 79  Pulse Ox % Pulse Ox % 92  Pulse Ox Activity Level  At rest  Oxygen Delivery Room Air/ 21 %  *Intake and Output.:   Shift 26-Oct-15 07:00  Grand Totals Intake:   Output:  800    Net:  -17 24 Hr.:  -520  Urine ml     Out:  800  Length of Stay Totals Intake:  9641 Output:  9700    Net:  -59   Brief Assessment:  GEN no acute distress   Cardiac Regular  -- JVD  --Rub   Respiratory normal resp effort  rhonchi   Gastrointestinal details normal Soft  Nontender  No gaurding   EXTR negative cyanosis/clubbing   Lab Results: Routine Chem:  26-Oct-15 06:20   Glucose, Serum 82  BUN 13  Creatinine (comp) 1.21  Sodium, Serum 138  Potassium, Serum 4.0  Chloride, Serum  97  CO2, Serum  36  Calcium (Total), Serum 8.8  Anion Gap  5  Osmolality (calc) 275  eGFR (African American)  58  eGFR (Non-African American)  48 (eGFR values <64mL/min/1.73 m2 may be an indication of chronic kidney disease (CKD). Calculated eGFR, using the MRDR Study equation, is useful in  patients with stable renal function. The eGFR calculation will not be reliable in acutely ill patients when serum creatinine is changing rapidly. It is not useful in patients on dialysis. The eGFR calculation may not be applicable to patients at the low and high extremes of body sizes, pregnant women, and vetetarians.)  Routine Hem:  26-Oct-15 06:20   WBC (CBC)  19.2  RBC (CBC)  3.51  Hemoglobin (CBC)  9.3  Hematocrit (CBC)  29.8  Platelet Count (CBC) 393  MCV 85  MCH 26.5  MCHC  31.2  RDW  16.7  Neutrophil % 72.5   Lymphocyte % 12.5  Monocyte % 9.1  Eosinophil % 5.5  Basophil % 0.4  Neutrophil #  13.9  Lymphocyte # 2.4  Monocyte #  1.8  Eosinophil #  1.1  Basophil # 0.1 (Result(s) reported on 22 Jun 2014 at 09:52AM.)   Assessment/Plan:  Assessment/Plan:  Assessment 1. Bacterial Pneumonia -patient has been very slow to improve -will repeat a CXR in the morning -will probably need a bronch but will review CXR first   Electronic Signatures: Allyne Gee (MD)  (Signed 26-Oct-15 20:39)  Authored: Chief Complaint, VITAL SIGNS/ANCILLARY NOTES, Brief Assessment, Lab Results, Assessment/Plan   Last Updated: 26-Oct-15 20:39 by Allyne Gee (MD)

## 2014-12-19 NOTE — Discharge Summary (Signed)
PATIENT NAME:  Audrey Sexton, Audrey Sexton MR#:  [id number] DATE OF BIRTH:  06-Sep-1950  DATE OF ADMISSION:  05/21/2014 DATE OF DISCHARGE:    For a detailed note please see the history and physical done on admission by Dr. Juliene PinaMody.   DIAGNOSES AT DISCHARGE:  1.  Chronic obstructive pulmonary disease exacerbation.  2.  Hemoptysis.  3.  Acute renal failure.  4.  Hyperglycemia.  5.  Possible new onset diabetes.  6.  Hypertension.  7.  Gastroesophageal reflux disease.   DIET: The patient is being discharged on a low-sodium, low-fat, carbohydrate -controlled diet.   ACTIVITY: As tolerated.   FOLLOWUP:   Dr. Maryellen PileEason in the next 1-2 weeks. Also follow up with Dr. Tedd SiasSolum in the next 1-2 weeks. Also follow up with [facility] in the next 1-2 weeks.   DISCHARGE MEDICATIONS:  Omeprazole 20 mg daily, Paxil 20 mg daily, albuterol inhaler 2 puffs 4 times daily as needed, DuoNebs 4 times daily as needed, oxybutynin 5 mg daily, vitamin E 200 international units daily, Cardizem CD 240 mg daily, Breo Ellipta 100/25 one puff daily, hydrocortisone topical ointment to be applied 2-4 times daily as needed, Lasix 40 mg daily, meloxicam 15 mg daily, vitamin D3 1000 international units daily, multivitamin daily, vitamin C 1000 mg daily, Spiriva 1 puff daily, atorvastatin 40 mg at bedtime, calcium with vitamin D 1 tab b.i.d., vitamin B12 1000 mcg 2 tabs daily, Tylenol 1000 mg q. 6 hours as needed, Levemir FlexPen 20 units at bedtime, NovoLog FlexPen 9 units t.i.d. with meals, Levaquin 5 mg daily x 5 days, and prednisone taper starting at 60 mg down to 10 mg over the next 6 days.   CONSULTANTS DURING THE HOSPITAL COURSE: Dr. Belia HemanKasa, from pulmonary, Dr. Tedd SiasSolum from endocrinology.   PERTINENT STUDIES DONE DURING THE HOSPITAL COURSE: A chest x-ray done on admission showing an increased right middle lobe infiltrate, underlying emphysema.   Blood cultures noted to be negative.   HOSPITAL COURSE: This is a 64 year old female who  presented to the hospital with shortness of breath and noted to be in COPD exacerbation, also noted to have hemoptysis.   1.  COPD exacerbation. This was likely related to the pneumonia seen on the chest x-ray. The patient was treated aggressively with IV steroids, around-the-clock nebulizer treatments, also maintained on some Spiriva and Symbicort and Pulmicort nebulizers. A pulmonary consult was obtained who agreed with this management. The patient was also given IV antibiotics for pneumonia including aztreonam. After getting aggressive therapy for about 3-4 days the patient's clinical symptoms have significantly improved, she has less wheezing and bronchospasm, she is already on oxygen at home which she will continue. At this point the patient will be discharged on oral prednisone taper and oral Levaquin for treatment for her underlying COPD and pneumonia as mentioned.  2.  Hemoptysis. The exact etiology of this was unclear. While in the hospital this had stopped. Hemoglobin remained stable. The patient was seen by pulmonary by Dr. Belia HemanKasa who ended up performing a bronchoscopy done on September 29 which did show some significant mucus plugging, and also showed some inflammation in the right middle lobe bronchus, this was biopsied. The patient should have a repeat bronchoscopy done a few weeks down the road, therefore is being set up with a new patient appointment with [facility] as an outpatient. At this point the patient is not having active hemoptysis. She will continue treatment for underlying COPD with the prednisone taper and Levaquin as stated above.  3.  Hypertension. The patient remained hemodynamically stable. She will continue her Cardizem.   4.  Anxiety. The patient was maintained on her Paxil. She will resume that.  5.  GERD.  The patient will continue her Protonix.  6.  Hyperlipidemia. The patient was maintained on atorvastatin, she will resume that.  7.  Acute renal failure. This was  likely related to dehydration and hyperglycemia. She was given some gentle fluids. Her creatinine since then improved and is trending down and further needs to be followed as an outpatient.  8.  Hyperglycemia. The patient's blood sugars have been significantly elevated while in the hospital, as high as over 300. Her hemoglobin A1c was noted to be 6.7. It is unclear whether her hyperglycemia is related to new onset diabetes versus steroid-induced hyperglycemia. I did get an endocrinology consult. The patient was seen by Dr. Tedd Sias who recommended discharging the patient on Levemir and NovoLog FlexPen for now and she will follow up with the patient once she gets taken off the steroids to further test her for possible diagnosis of diabetes. The patient has also received diabetic lifestyle assessment. She is being discharged with home health nursing services to help her with a new diagnosis of possible diabetes.   CODE STATUS:  The patient is a full code.   TIME SPENT ON DISCHARGE: 40 minutes.     ____________________________ Rolly Pancake. Cherlynn Kaiser, MD vjs:bu D: 05/26/2014 14:56:44 ET T: 05/26/2014 19:54:21 ET JOB#: [id number]  cc: Rolly Pancake. Cherlynn Kaiser, MD, <Dictator> Serita Sheller. Maryellen Pile, MD A. Wendall Mola, MD  Pulmonary Houston Siren MD ELECTRONICALLY SIGNED 06/09/2014 14:59

## 2014-12-19 NOTE — Consult Note (Signed)
PATIENT NAME:  Audrey HammockGRAVES, Doloros MR#:  [id number] DATE OF BIRTH:  1951-03-08  DATE OF CONSULTATION:  05/25/2014  REFERRING PHYSICIAN:  Vivek J. Cherlynn KaiserSainani, MD CONSULTING PHYSICIAN:  A. Wendall MolaMelissa Sacramento Monds, MD  CHIEF COMPLAINT: Hyperglycemia.   HISTORY OF PRESENT ILLNESS: This is a 64 year old female with no prior history of diabetes, who was admitted with pneumonia on 05/21/2014. She has a history of COPD and uses oxygen therapy intermittently. She denies prior exposure to steroids. She has been started on high-dose steroids and is currently receiving methylprednisolone 60 mg every 8 hours. She has developed severe hyperglycemia and has been initiated on basal-bolus insulin therapy. Currently receiving Levemir 20 units at bedtime, dose increased from 10 units on 09/27, as well as NovoLog 5 units t.i.d. a.c. plus a NovoLog insulin sliding scale before meals and at bedtime. Over the last 24 hours, blood sugars have been elevated in the range of 152-314. Appetite is good and she is generally eating 100% of her meal trays. A hemoglobin A1c on 09/27 was 6.7%.   PAST MEDICAL HISTORY:  1.  COPD.  2.  Hypertension.  3.  Arthritis.   PAST SURGICAL HISTORY:  1.  Tonsillectomy.  2.  Tubal ligation.  3.  Left shoulder arthroscopy.  4.  Left hand surgery.  5.  Left leg tibia repair.   SOCIAL HISTORY: She is a former smoker, quit tobacco 5 years ago. No alcohol or tobacco use at this time.   FAMILY HISTORY: Positive for diabetes in her mother and Alzheimer's in her father. She had a grandmother with renal failure. One sister with breast cancer.   ALLERGIES: ASPIRIN, EXCEDRIN, PENICILLIN, IBUPROFEN, OXYCODONE.   CURRENT MEDICATIONS:  1.  Methylprednisolone 60 mg q. 8 hours.  2.  Levemir 20 units at bedtime.  3.  NovoLog 5 units t.i.d. before meals.  4.  NovoLog sliding scale q. before meals and at bedtime.  5.  Atorvastatin 40 mg at bedtime.  6.  Aztreonam 2 grams q. 8 hours.  7.  Diltiazem extended release  240 mg daily.  8.  Pantoprazole 40 mg daily.  9.  Paxil 20 mg daily.  10.  Symbicort 160/4.5 two puffs b.i.d.  11.  Spiriva 1 inhalation daily.   REVIEW OF SYSTEMS: GENERAL: No weight loss. No fatigue.  HEENT: No blurred vision. No sore throat.  NECK: No neck pain. No dysphasia.  CARDIAC: No chest pain. No palpitation.  PULMONARY: She has shortness of breath. She has wheeze. She has cough.  ABDOMEN: Appetite is good. Denies nausea, vomiting or change in bowel habits.  EXTREMITIES: Denies lower extremity swelling.  SKIN: Denies recent rash or skin changes. ENDOCRINOLOGY: Denies heat or cold intolerance.  GENITOURINARY: Denies dysuria or hematuria.  PSYCHIATRIC: Mood is fair.  NEUROLOGIC: Denies recent falls. Denies tremor.   PHYSICAL EXAMINATION:  VITAL SIGNS: Height 65.9 inches, weight 204 pounds, BMI 33. Temperature 98.2, pulse 108, respirations 18, blood pressure 149/77, pulse oximetry 93%.  GENERAL: White female, middle age, no acute distress.  HEENT: EOMI. Oropharynx is clear. Mucous membranes moist.  NECK: Supple. No thyromegaly. No carotid bruit.  CARDIAC: Regular rate and rhythm without murmur.  PULMONARY: Diffuse wheeze throughout both lung fields, moderate-severe. Reduced inspiratory effort. No use of accessory muscles of respiration. Able to speak in full sentences.  ABDOMEN: Diffusely soft, nontender, nondistended.  EXTREMITIES: No peripheral edema is present.  SKIN: No rash or dermatopathy is present.  NEUROLOGIC: Alert and oriented.  PSYCHIATRIC: Calm, cooperative.   LABORATORY DATA: Glucose  271, BUN 34, creatinine 1.5, potassium 3.4, calcium 8.7. WBC 21.8, hemoglobin 11.6, hematocrit 38.5, platelets 324,000. On 09/27 hemoglobin A1c 6.7%.   ASSESSMENT: A 64 year old female admitted with pneumonia, chronic obstructive pulmonary disease exacerbation, currently receiving high-dose steroids and noted to have steroid-induced hyperglycemia. Hemoglobin A1c is 6.7%, is  consistent with diabetes, however, as this was obtained after she had been treated with steroids, it is not clear at this time whether she has true underlying diabetes or just steroid-induced hyperglycemia.    PLAN:  1.  Recommend continuing basal-bolus insulin therapy.  2.  Will adjust NovoLog to 9 units t.i.d. before meals.  3.  Continue Levemir insulin and NovoLog sliding scale as are currently dosed.  4.  Will request diabetic educators, counsel the patient on diagnosis, medical management and importance of fingerstick blood sugar monitoring.  5.  Anticipate that as steroid dose is reduced, likewise can reduce her insulin requirements. She may or may not need oral medications in the future for management of underlying diabetes. Plan to determine this in upcoming weeks, as steroids are reduced.   I will follow along with you. I will arrange for outpatient followup as well.    ____________________________ A. Wendall Mola, MD ams:TT D: 05/25/2014 20:50:52 ET T: 05/25/2014 21:22:10 ET JOB#: [id number]  cc: A. Wendall Mola, MD, <Dictator> Macy Mis MD ELECTRONICALLY SIGNED 05/26/2014 8:40

## 2014-12-19 NOTE — Discharge Summary (Signed)
PATIENT NAME:  Audrey HammockGRAVES, Pietra MR#:  [id number] DATE OF BIRTH:  11-07-1950  DATE OF ADMISSION:  06/13/2014 DATE OF DISCHARGE:  06/24/2014  ADDENDUM to Dr. Suzanne BoronKonidena's interim discharge summary dictated on October 23.    DISCHARGE DIAGNOSES:   1.  Nosocomial pneumonia, which is necrotizing, probably from Staphylococcus aureus.  2.  Spinal mass.   PROCEDURES: Bronchoscopy was done on October 28.   CONSULTATIONS: Infectious disease, Dr. Sampson GoonFitzgerald;  pulmonology, Dr. Freda MunroSaadat Khan;  nephrology, Dr. Cherylann RatelLateef.   BRIEF HISTORY AND PHYSICAL AND HOSPITAL COURSE: The patient is a 64 year old female admitted on 10/17 for shortness of breath and right-sided chest pain. Please review Dr. Suzanne BoronKonidena's interim discharge summary for details. The patient was started on IV vancomycin and Azactam and Levaquin. Prior to this admission, she had a CT guided bronchoscopy, which had revealed Klebsiella pneumonia. Infectious disease has followed this patient during the hospital course.  On October 23, the patient's  IV antibiotics were changed to clindamycin as this pneumonia is most likely from Staphylococcus aureus which is sensitive to most of the antibiotics. He had recommended a 21 day course of treatment for Staphylococcus aureus pneumonia with clindamycin. The patient's clinical situation was stabilized on clindamycin. She started feeling better. Inhalation treatments and flutter devices were continued. The patient was encouraged to use incentive spirometry. Subsequently, the patient had a bronchoscopy done by Dr. Freda MunroSaadat Khan on October 27.  The pathology report needs to be followed up. Bronchial washings were also done during this bronchoscopy.   As the patient's clinical condition is stable, she was discharged home after bronchoscopy to continue 15 more days of clindamycin. The patient has finished a 7 day course of clindamycin during the hospital course. The plan is to provide her a 21 day course of clindamycin. The patient  was instructed to follow up with Dr. Sampson GoonFitzgerald and  Dr. Carolynne EdouardSadaat Khan as an outpatient after discharge.   Intraspinal mass:  The patient had a repeat x-ray done prior to bronchoscopy which had revealed right upper lobe consolidation lying anteriorly and inferiorly, adjacent to the minor fissure. CAT scan of the chest was done for further evaluation which has revealed persistent right upper lobe consolidation with new area of separated fluid attenuation in the right upper lobe containing a  severe tiny foci of gas consistent with developing lung abscess; severe underlying COPD, stable hepatic and splenic findings. 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  This was discussed with the patient. I have recommended the patient to follow up with her primary care physician for further imaging studies regarding questionable intraspinal mass.   CONDITION AT THE TIME OF DISCHARGE:  Stable.     SIGNIFICANT LABORATORIES AND IMAGING STUDIES: CAT scan of the chest with contrast was found on October 27 compared with October 17 has revealed persistent right upper lobe consolidation with new area of separated fluid attenuation in the right upper lobe, foci of gas consistent with developing lung abscess, 5.6 x 4.0 x 3.6 cm size, severe COPD, calcified intraspinal mass at T7-T8 13 x 10 x 20 mm, questionable calcified disk fragment versus less likely spinal meningioma. The patient's Accu-Chek was at 92. BMP: BUN and creatinine are normal. Anion gap is 6, GFR is 50. WBC 17.7, hemoglobin 9.8, hematocrit 30.3, platelets are 319,000. PT-INR is normal. Bronchial washings have revealed Candida albicans, but this report is given on November 1, after patient's discharge.   This  needs to be followed up by the primary care physician, infectious disease, Dr. Sampson Goon, and pulmonology, Dr. Welton Flakes. Bronchial cytology need to be followed up by the ID and Dr.  Freda Munro as well.   MEDICATIONS AT THE TIME OF DISCHARGE: Clindamycin 600 mg p.o. q.8h. for 15 days, omeprazole 20 mg 1 capsule p.o. once daily, paroxetine 20 mg 1 tablet p.o. once daily, Proair HFA 90 mcg 2 puffs inhalation 1-4 times a day as needed for shortness of breath, albuterol nebulizer treatments 4 times a day as needed, oxybutynin 5 mg 1 tablet p.o. once daily,  Cartia  XT 240 mg 1 capsule p.o. once daily, ipratropium 500 mcg/2.5 inhalation 1 to 2 times a day, BREO ELLIPTA 1 puff inhalation once a day, hydrocortisone topical 2.5% cream apply topically to effected area 2 to 4 times a day, furosemide 40 mg once daily, meloxicam 15 mg once daily, atorvastatin 40 mg p.o. at bedtime, calcium 600 mg with vitamin D 1 tablet p.o. 2 times a day, Tylenol 500 mg 2 tablets p.o. every 6 hours as needed for pain, Spiriva 18 mcg 1 capsule via HandiHaler once a day.   Home health is arranged. The patient is to continue using 3 liters of oxygen 24/7.    FOLLOWUP APPOINTMENTS: With primary care physician in 1 to 2 weeks, ID as scheduled on November 5 at 12 noon. Follow up with Dr. Freda Munro, pulmonology, in 1 to 2 weeks and nephrology as needed basis. PCP to follow up and do further imaging studies to rule out the spinal mass, which was noted on the CAT scan of the chest.   DIET: Low sodium.   ACTIVITY: As tolerated.   Plan of care was discussed in detail with the patient. She verbalized understanding of the plan.   TOTAL TIME SPENT was 45 minutes.  ____________________________ Ramonita Lab, MD ag:jp D: 06/30/2014 10:15:54 ET T: 06/30/2014 10:36:33 ET JOB#: [id number]  cc: Ramonita Lab, MD, <Dictator> Yevonne Pax, MD Stann Mainland. Sampson Goon, MD Lennox Pippins, MD  Ramonita Lab MD ELECTRONICALLY SIGNED 07/06/2014 21:51

## 2015-02-18 ENCOUNTER — Other Ambulatory Visit: Payer: Self-pay | Admitting: Internal Medicine

## 2015-02-18 ENCOUNTER — Other Ambulatory Visit: Payer: Self-pay | Admitting: Family Medicine

## 2015-02-18 DIAGNOSIS — Z1231 Encounter for screening mammogram for malignant neoplasm of breast: Secondary | ICD-10-CM

## 2015-02-23 ENCOUNTER — Ambulatory Visit: Payer: Self-pay | Attending: Family Medicine

## 2015-12-07 ENCOUNTER — Encounter: Payer: Self-pay | Admitting: Emergency Medicine

## 2015-12-07 ENCOUNTER — Emergency Department
Admission: EM | Admit: 2015-12-07 | Discharge: 2015-12-08 | Disposition: A | Discharge status: Home / Self Care | Payer: Medicare HMO | Attending: Emergency Medicine | Admitting: Emergency Medicine

## 2015-12-07 DIAGNOSIS — R51 Headache: Secondary | ICD-10-CM | POA: Insufficient documentation

## 2015-12-07 DIAGNOSIS — Y999 Unspecified external cause status: Secondary | ICD-10-CM | POA: Insufficient documentation

## 2015-12-07 DIAGNOSIS — S8001XA Contusion of right knee, initial encounter: Secondary | ICD-10-CM | POA: Insufficient documentation

## 2015-12-07 DIAGNOSIS — W010XXA Fall on same level from slipping, tripping and stumbling without subsequent striking against object, initial encounter: Secondary | ICD-10-CM | POA: Diagnosis not present

## 2015-12-07 DIAGNOSIS — J449 Chronic obstructive pulmonary disease, unspecified: Secondary | ICD-10-CM | POA: Diagnosis not present

## 2015-12-07 DIAGNOSIS — Y939 Activity, unspecified: Secondary | ICD-10-CM | POA: Insufficient documentation

## 2015-12-07 DIAGNOSIS — T07XXXA Unspecified multiple injuries, initial encounter: Secondary | ICD-10-CM

## 2015-12-07 DIAGNOSIS — S60221A Contusion of right hand, initial encounter: Secondary | ICD-10-CM | POA: Diagnosis present

## 2015-12-07 DIAGNOSIS — Y92009 Unspecified place in unspecified non-institutional (private) residence as the place of occurrence of the external cause: Secondary | ICD-10-CM | POA: Insufficient documentation

## 2015-12-07 DIAGNOSIS — S0083XA Contusion of other part of head, initial encounter: Secondary | ICD-10-CM | POA: Diagnosis not present

## 2015-12-07 DIAGNOSIS — J3489 Other specified disorders of nose and nasal sinuses: Secondary | ICD-10-CM | POA: Insufficient documentation

## 2015-12-07 DIAGNOSIS — W19XXXA Unspecified fall, initial encounter: Secondary | ICD-10-CM

## 2015-12-07 DIAGNOSIS — Z87891 Personal history of nicotine dependence: Secondary | ICD-10-CM | POA: Insufficient documentation

## 2015-12-07 HISTORY — DX: Chronic obstructive pulmonary disease, unspecified: J44.9

## 2015-12-07 MED ORDER — OXYCODONE-ACETAMINOPHEN 5-325 MG PO TABS
2.0000 | ORAL_TABLET | Freq: Once | ORAL | Status: AC
  Administered 2015-12-08: 2 via ORAL
  Filled 2015-12-07: qty 2

## 2015-12-07 NOTE — ED Notes (Signed)
Pt to ED from home by EMS post fall. EMS reports pt got feet tangled in hallway of her home, fell landing on knees, elbows, hands and face. Pt fell onto a tiled floor with concrete underneath. Pt denies LOC. Pt ambulatory on scene, per EMS. On assessment, pt has bruising/swelling to the right knee, dried blood to the nose, bruising to both elbows.

## 2015-12-08 ENCOUNTER — Emergency Department: Payer: Medicare HMO

## 2015-12-08 MED ORDER — TRAMADOL HCL 50 MG PO TABS: 50.0000 mg | ORAL_TABLET | Freq: Four times a day (QID) | ORAL | Status: DC | PRN

## 2015-12-08 NOTE — Discharge Instructions (Signed)
You have been seen in the emergency department after a fall. Her workup has been negative for any fractures or injuries to your head. You have suffered contusions/bruising. Please take your pain medication as needed, as prescribed. He may also take Tylenol or Motrin for any discomfort. Please follow-up with her primary care physician is symptoms worsen, return to the emergency department for any acutely worsening symptoms, or any other urgent concerns.     Contusion    A contusion is a deep bruise. Contusions are the result of a blunt injury to tissues and muscle fibers under the skin. The injury causes bleeding under the skin. The skin overlying the contusion may turn blue, purple, or yellow. Minor injuries will give you a painless contusion, but more severe contusions may stay painful and swollen for a few weeks.  CAUSES  This condition is usually caused by a blow, trauma, or direct force to an area of the body.  SYMPTOMS  Symptoms of this condition include:  Swelling of the injured area.  Pain and tenderness in the injured area.  Discoloration. The area may have redness and then turn blue, purple, or yellow. DIAGNOSIS  This condition is diagnosed based on a physical exam and medical history. An X-ray, CT scan, or MRI may be needed to determine if there are any associated injuries, such as broken bones (fractures).  TREATMENT  Specific treatment for this condition depends on what area of the body was injured. In general, the best treatment for a contusion is resting, icing, applying pressure to (compression), and elevating the injured area. This is often called the RICE strategy. Over-the-counter anti-inflammatory medicines may also be recommended for pain control.  HOME CARE INSTRUCTIONS  Rest the injured area.  If directed, apply ice to the injured area:  Put ice in a plastic bag.  Place a towel between your skin and the bag.  Leave the ice on for 20 minutes, 2-3 times per day. If  directed, apply light compression to the injured area using an elastic bandage. Make sure the bandage is not wrapped too tightly. Remove and reapply the bandage as directed by your health care provider.  If possible, raise (elevate) the injured area above the level of your heart while you are sitting or lying down.  Take over-the-counter and prescription medicines only as told by your health care provider. SEEK MEDICAL CARE IF:  Your symptoms do not improve after several days of treatment.  Your symptoms get worse.  You have difficulty moving the injured area. SEEK IMMEDIATE MEDICAL CARE IF:  You have severe pain.  You have numbness in a hand or foot.  Your hand or foot turns pale or cold. This information is not intended to replace advice given to you by your health care provider. Make sure you discuss any questions you have with your health care provider.  Document Released: 05/24/2005 Document Revised: 05/05/2015 Document Reviewed: 12/30/2014  Elsevier Interactive Patient Education [employer]2016 [employer]

## 2015-12-08 NOTE — ED Provider Notes (Addendum)
[facility] Emergency Department Provider Note  Time seen: 12:35 AM  I have reviewed the triage vital signs and the nursing notes.   HISTORY  Chief Complaint Fall    HPI Audrey Sexton is a 65 y.o. female with a past medical history of COPD, presents to the emergency department after a fall. According to the patient she tripped in the hallway of her home landing forwards on her hands and knees and states her face hit the ground as well. Denies any LOC, nausea or vomiting. Patient has an abrasion to the right knee, bruising to the right hand, pain to the nose.     Past Medical History  Diagnosis Date  . COPD (chronic obstructive pulmonary disease) (HCC)     There are no active problems to display for this patient.   History reviewed. No pertinent past surgical history.  No current outpatient prescriptions on file.  Allergies Review of patient's allergies indicates no known allergies.  History reviewed. No pertinent family history.  Social History Social History  Substance Use Topics  . Smoking status: Former Smoker    Types: Cigarettes  . Smokeless tobacco: None  . Alcohol Use: No    Review of Systems Constitutional: Negative for fever. Cardiovascular: Negative for chest pain. Respiratory: Negative for shortness of breath. Gastrointestinal: Negative for abdominal pain Musculoskeletal:Right hand pain. Right knee pain. Facial pain. Neurological: Negative for headache 10-point ROS otherwise negative.  ____________________________________________   PHYSICAL EXAM:  VITAL SIGNS: ED Triage Vitals  Enc Vitals Group     BP 12/07/15 2318 163/78 mmHg     Pulse Rate 12/07/15 2318 120     Resp 12/07/15 2318 18     Temp 12/07/15 2318 98.9 F (37.2 C)     Temp Source 12/07/15 2318 Oral     SpO2 12/07/15 2318 92 %     Weight 12/07/15 2318 208 lb (94.348 kg)     Height 12/07/15 2318  (1.626 m)     Head Cir --      Peak Flow --    Pain Score 12/07/15 2319 5     Pain Loc --      Pain Edu? --      Excl. in GC? --     Constitutional: Alert and oriented. Well appearing and in no distress. Eyes: Normal exam ENT   Head: Normocephalic, Moderate tenderness palpation over nasal bridge.   Nose: No congestion/rhinnorhea. Dried blood right nostril. No septal hematoma.   Mouth/Throat: Mucous membranes are moist. Cardiovascular: Normal rate, regular rhythm. No murmur Respiratory: Normal respiratory effort without tachypnea nor retractions. Breath sounds are clear  Gastrointestinal: Soft and nontender. No distention. Musculoskeletal: Mild ecchymosis to the palmar aspect of the right hand. Small 1 x 2 cm abrasion to the right knee with mild tenderness to palpation. Stable pelvis with good range of motion in bilateral hips. Patient does have mild tenderness palpation of left trapezius, but no tenderness in the shoulder or neck. Neurologic:  Normal speech and language. No gross focal neurologic deficits Skin:  Skin is warm, dry and intact.  Psychiatric: Mood and affect are normal. Speech and behavior are normal.  ____________________________________________    RADIOLOGY  CT scans are negative. X-rays are negative.   EKG reviewed and interpreted by myself shows sinus tachycardia 116 bpm, narrow QRS, normal axis, normal intervals, nonspecific ST changes. No ST elevations. ____________________________________________    INITIAL IMPRESSION / ASSESSMENT AND PLAN / ED COURSE  Pertinent labs &  imaging results that were available during my care of the patient were reviewed by me and considered in my medical decision making (see chart for details).  Patient presents the emergency department after a fall. We will obtain imaging of the right hand, right knee, face and head. Denies LOC, nausea or vomiting. Currently the patient is in no distress. We will dose Percocet for pain control.  Imaging negative for any traumatic  injuries. We'll discharge home with a short course of pain medication. Patient agreeable to plan.  ____________________________________________   FINAL CLINICAL IMPRESSION(S) / ED DIAGNOSES  Fall Abrasion Contusions   Minna AntisKevin Zacharie Portner, MD 12/08/15 [id number]  Minna AntisKevin Arjuna Doeden, MD 12/08/15 [phone]

## 2016-01-12 IMAGING — CR DG CHEST 1V PORT
1 series · 1 of 1 positions shown · non-contrast
Comparison: 05/21/2014

CLINICAL DATA: Shortness of breath, chest pain, right rib pain

EXAM:
PORTABLE CHEST - 1 VIEW

[ap]
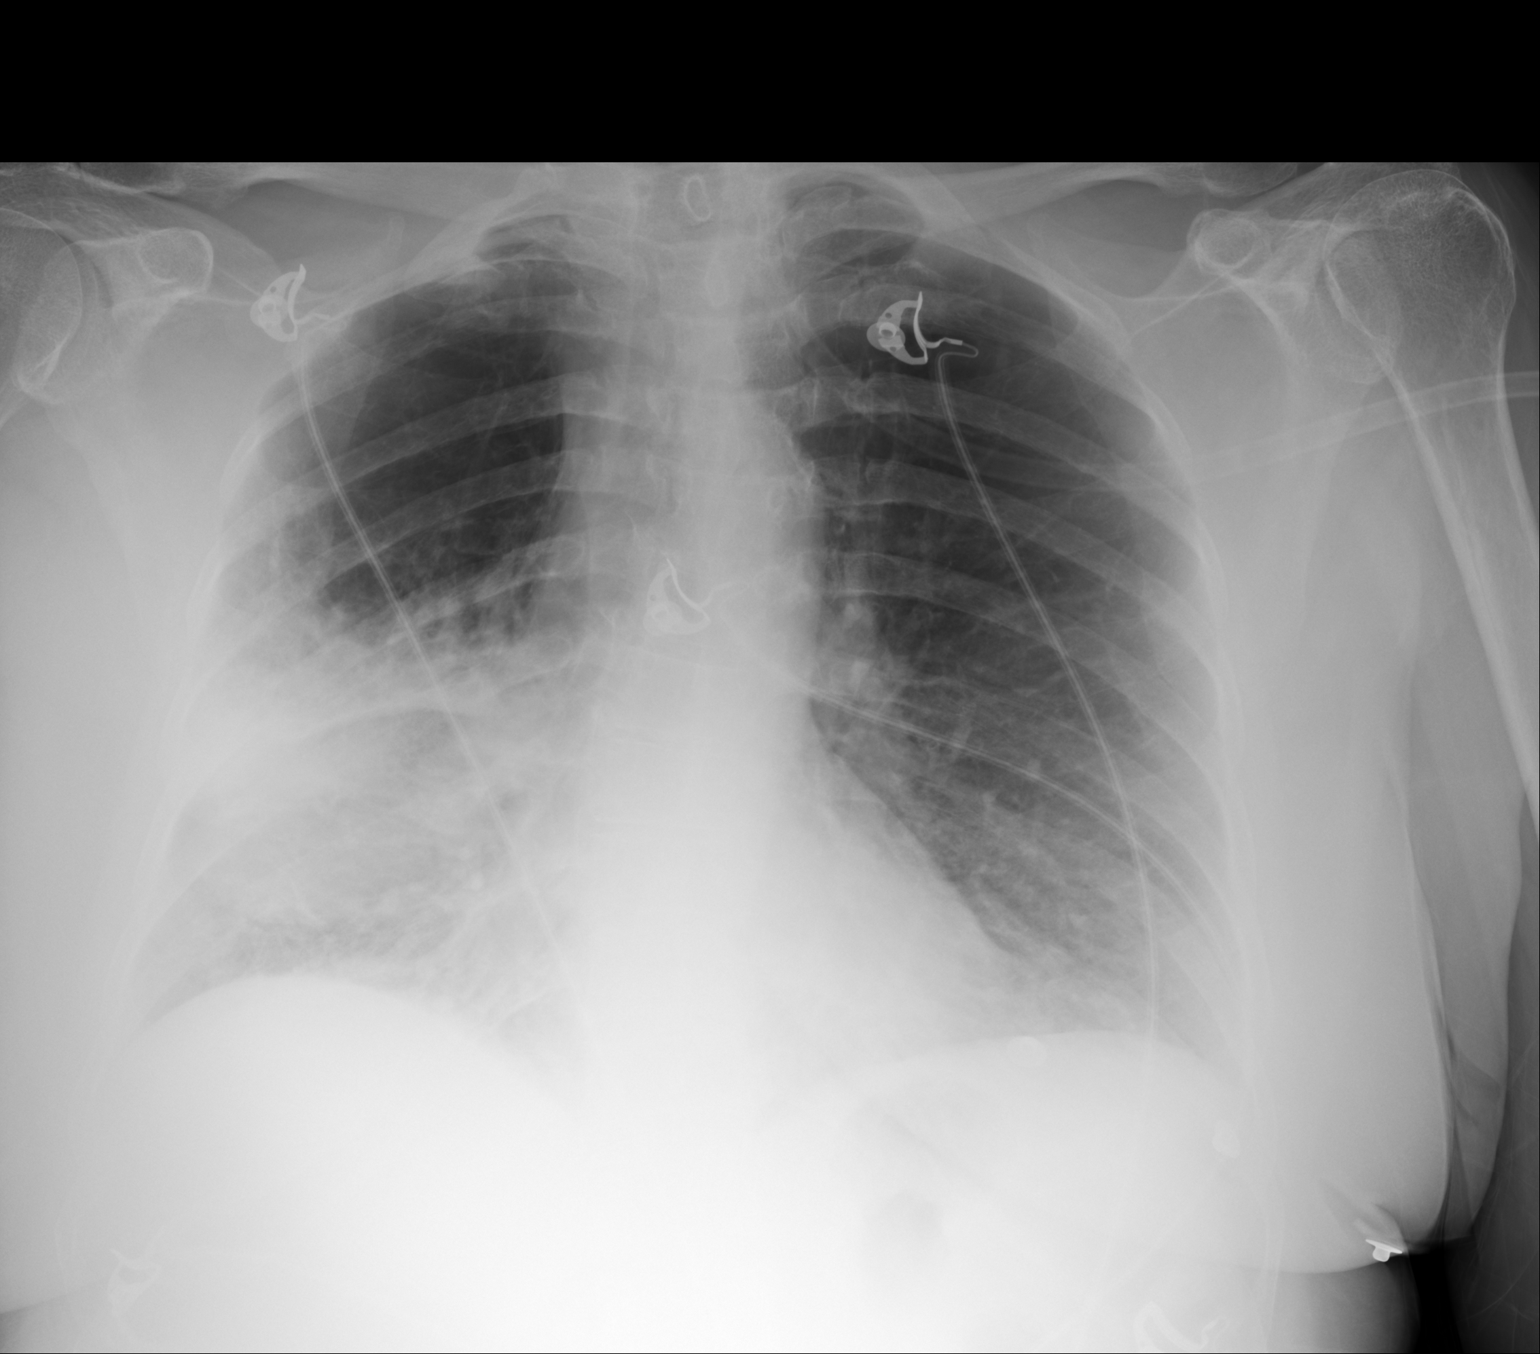

[1 of 1 positions shown; findings below may reference images not displayed]

FINDINGS: There is worsening right middle lobe and right upper lobe
consolidation. There is no pleural effusion or pneumothorax. Stable
cardiomediastinal silhouette. Unremarkable osseous structures.
IMPRESSION: Worsening right middle lobe and right upper lobe pneumonia.

## 2016-01-14 IMAGING — US US RENAL KIDNEY
1 series · 14 of 25 positions shown · non-contrast
Comparison: 05/27/2007 CT.

CLINICAL DATA: 62-year-old female with renal failure. Initial
encounter.

EXAM:
RENAL/URINARY TRACT ULTRASOUND COMPLETE

[Series 1: us renal kidney · 0.30mm/px · 14 of 32 slices shown]
[im 1/32]
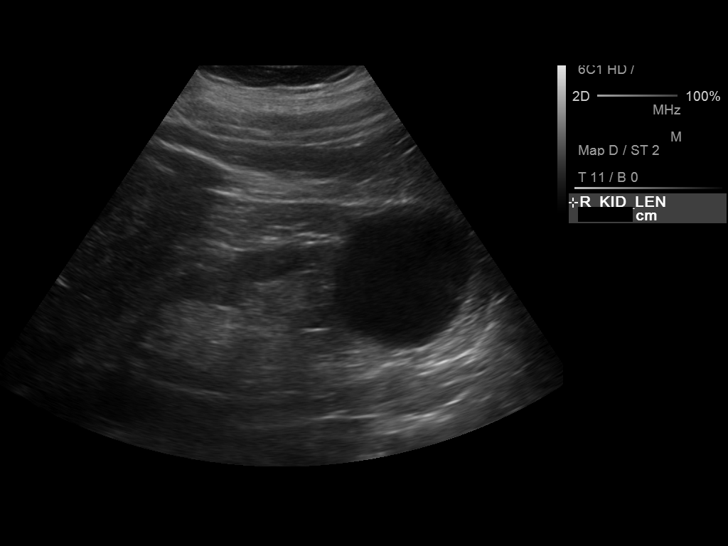
[im 3/32]
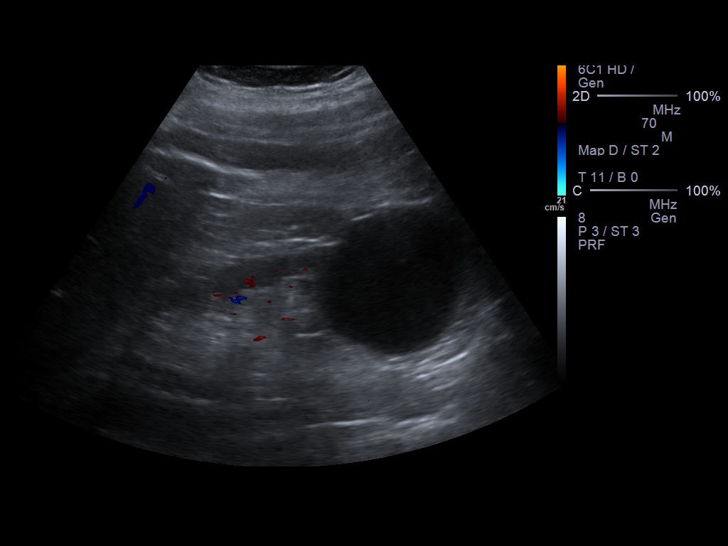
[im 6/32]
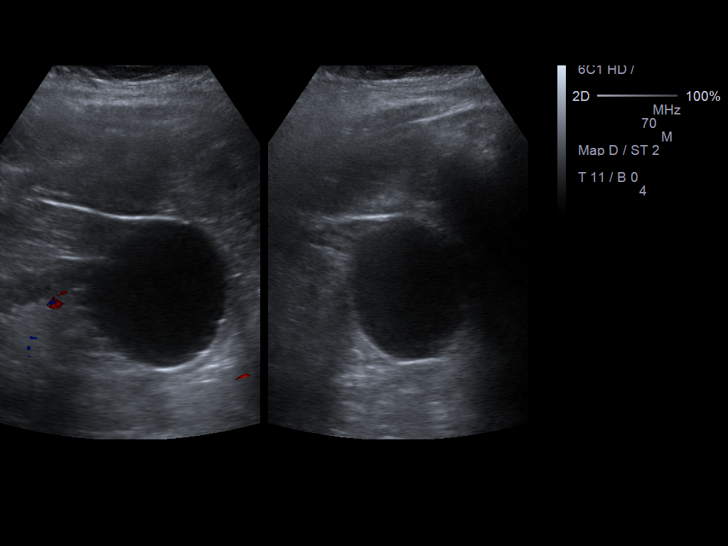
[im 8/32]
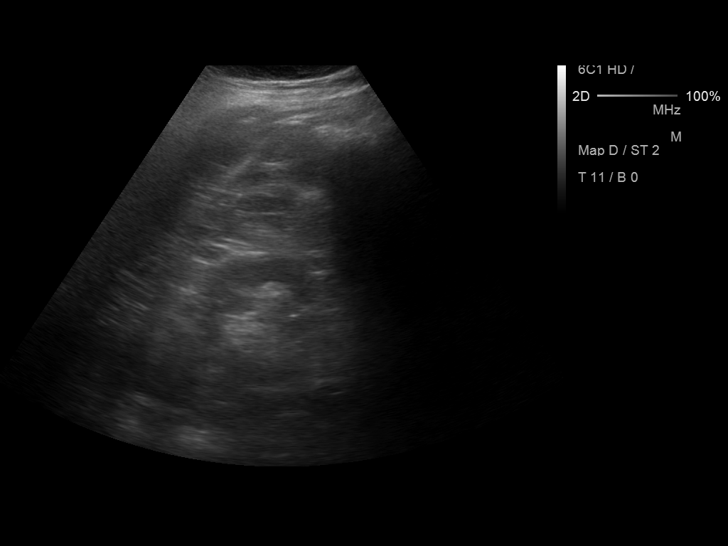
[im 11/32]
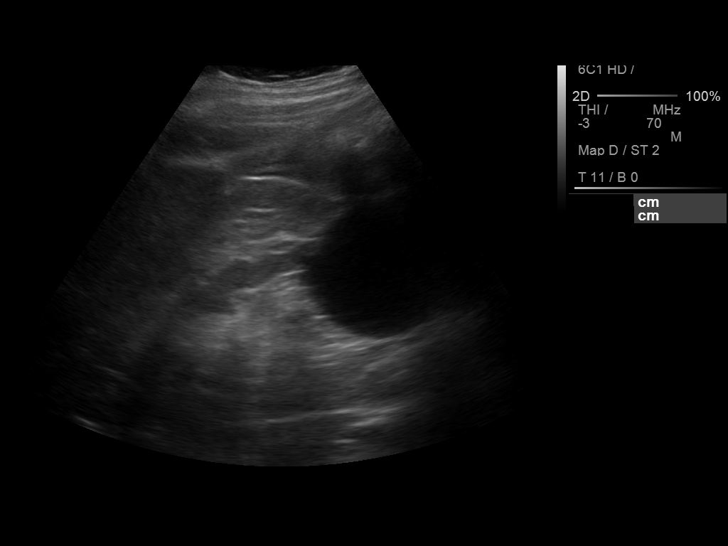
[im 12/32]
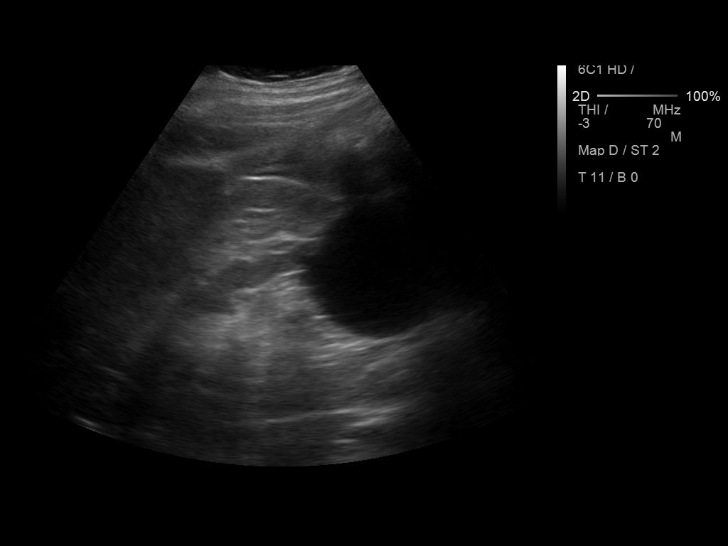
[im 15/32]
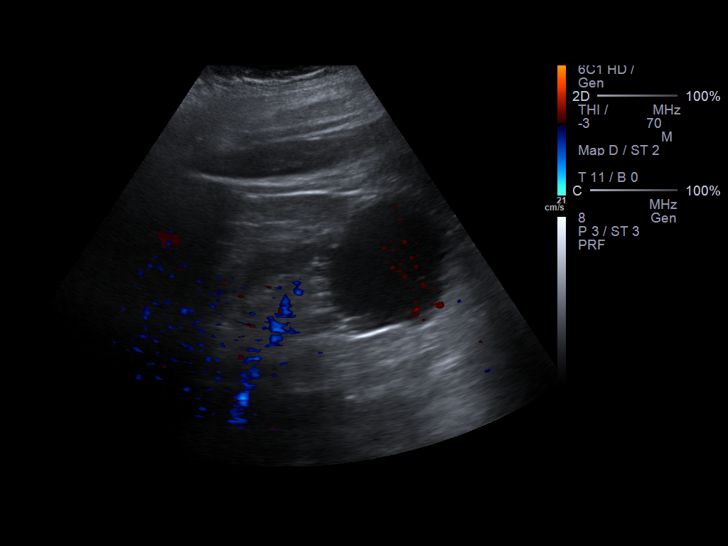
[im 17/32]
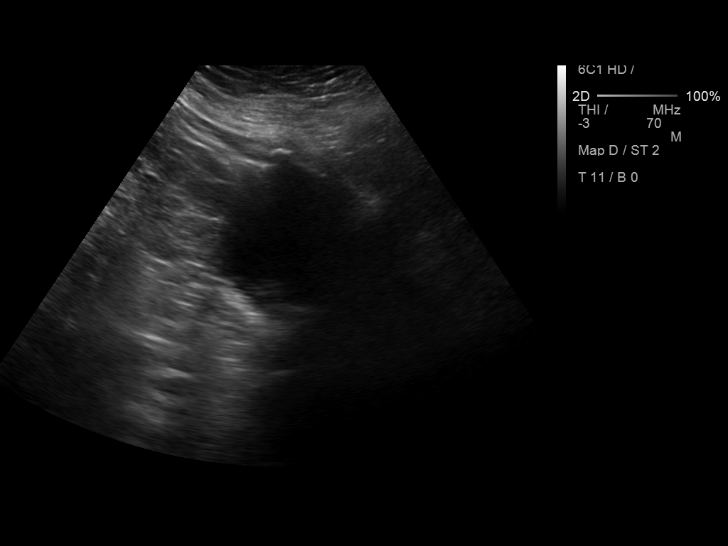
[im 20/32]
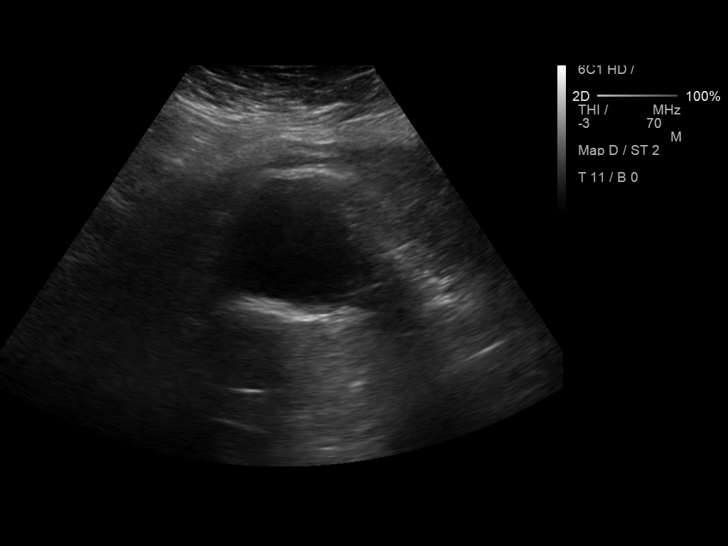
[im 21/32]
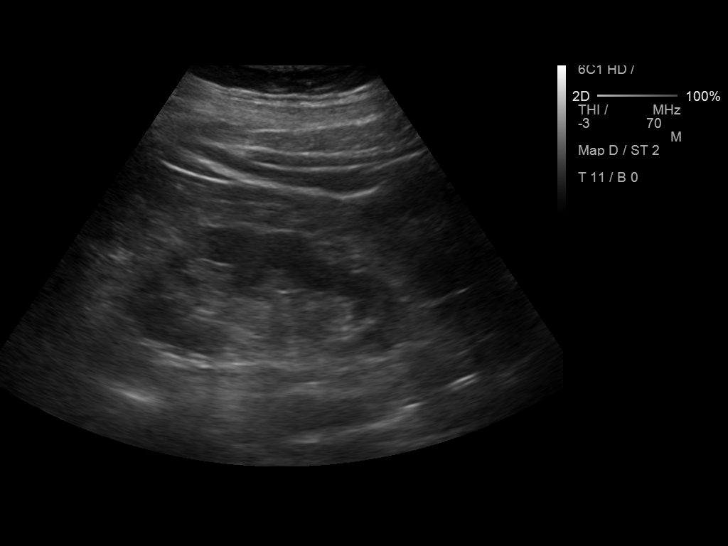
[im 24/32]
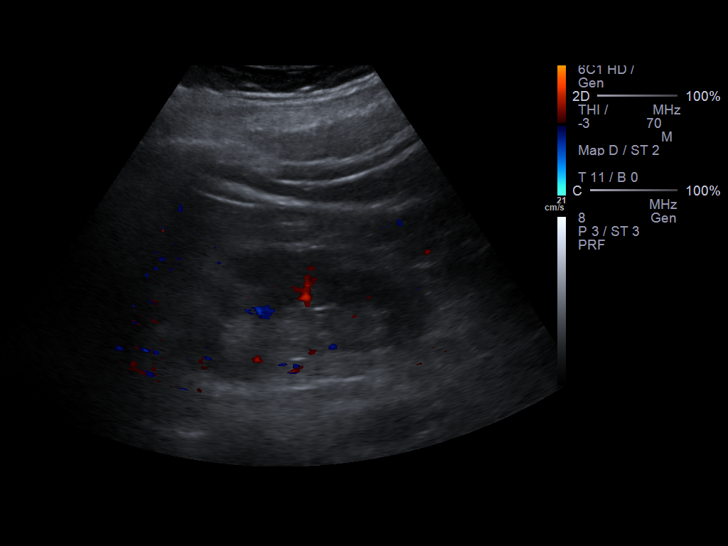
[im 26/32]
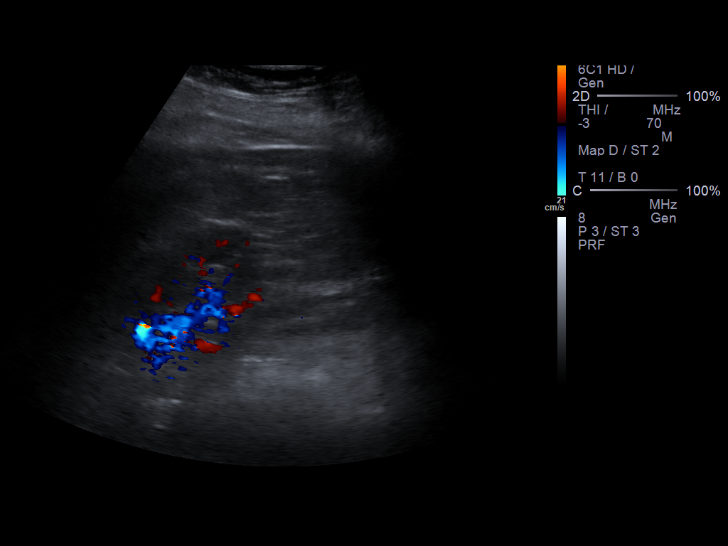
[im 29/32]
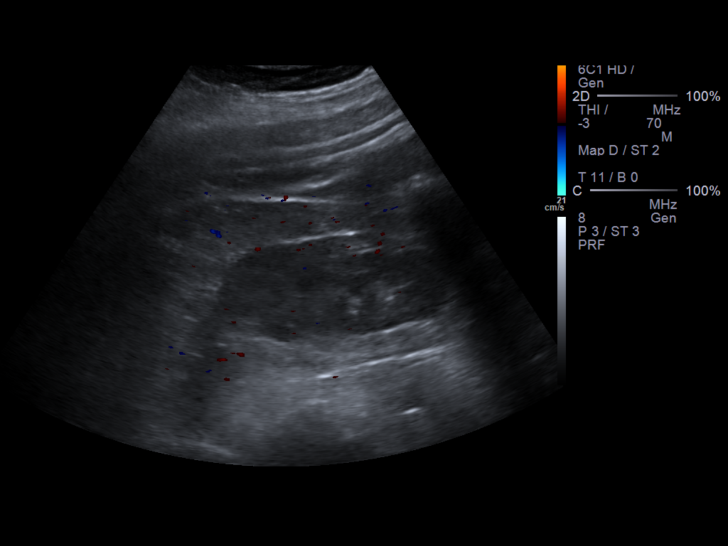
[im 32/32]
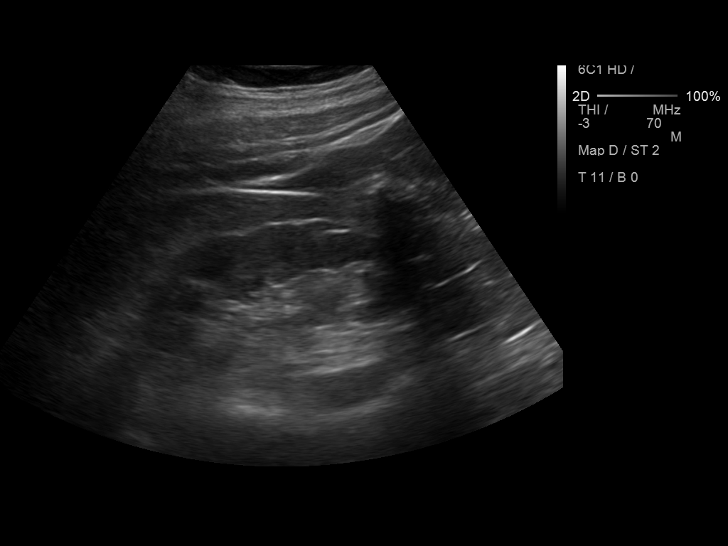

[14 of 25 positions shown; findings below may reference images not displayed]

FINDINGS: Right Kidney:

Length: 15.2 cm.. No hydronephrosis. 5.8 x 6.4 x 5.3 cm lower pole
cyst. Renal parenchymal thinning.

Left Kidney:

Length: 10.7 cm..  No hydronephrosis.  Renal parenchymal thinning.

Bladder:

Contracted without obvious mass.
IMPRESSION: Bilateral renal parenchymal thinning without evidence of
hydronephrosis.

6.4 cm right lower pole renal cyst.

## 2016-05-23 ENCOUNTER — Other Ambulatory Visit (HOSPITAL_COMMUNITY): Payer: Self-pay | Admitting: Internal Medicine

## 2016-05-23 DIAGNOSIS — Z136 Encounter for screening for cardiovascular disorders: Secondary | ICD-10-CM

## 2016-08-17 ENCOUNTER — Emergency Department: Payer: Medicare HMO

## 2016-08-17 ENCOUNTER — Encounter: Payer: Self-pay | Admitting: *Deleted

## 2016-08-17 ENCOUNTER — Inpatient Hospital Stay
Admission: EM | Admit: 2016-08-17 | Discharge: 2016-08-24 | DRG: 189 | Disposition: A | Discharge status: Home Health | Payer: Medicare HMO | Attending: Internal Medicine | Admitting: Internal Medicine

## 2016-08-17 DIAGNOSIS — R0603 Acute respiratory distress: Secondary | ICD-10-CM

## 2016-08-17 DIAGNOSIS — K5900 Constipation, unspecified: Secondary | ICD-10-CM | POA: Diagnosis present

## 2016-08-17 DIAGNOSIS — Z515 Encounter for palliative care: Secondary | ICD-10-CM

## 2016-08-17 DIAGNOSIS — J449 Chronic obstructive pulmonary disease, unspecified: Secondary | ICD-10-CM | POA: Diagnosis present

## 2016-08-17 DIAGNOSIS — R072 Precordial pain: Secondary | ICD-10-CM | POA: Diagnosis present

## 2016-08-17 DIAGNOSIS — Z794 Long term (current) use of insulin: Secondary | ICD-10-CM

## 2016-08-17 DIAGNOSIS — Z8249 Family history of ischemic heart disease and other diseases of the circulatory system: Secondary | ICD-10-CM

## 2016-08-17 DIAGNOSIS — Z820 Family history of epilepsy and other diseases of the nervous system: Secondary | ICD-10-CM

## 2016-08-17 DIAGNOSIS — R2681 Unsteadiness on feet: Secondary | ICD-10-CM

## 2016-08-17 DIAGNOSIS — J9600 Acute respiratory failure, unspecified whether with hypoxia or hypercapnia: Secondary | ICD-10-CM

## 2016-08-17 DIAGNOSIS — E119 Type 2 diabetes mellitus without complications: Secondary | ICD-10-CM | POA: Diagnosis present

## 2016-08-17 DIAGNOSIS — Z9981 Dependence on supplemental oxygen: Secondary | ICD-10-CM

## 2016-08-17 DIAGNOSIS — Z79899 Other long term (current) drug therapy: Secondary | ICD-10-CM

## 2016-08-17 DIAGNOSIS — E785 Hyperlipidemia, unspecified: Secondary | ICD-10-CM | POA: Diagnosis present

## 2016-08-17 DIAGNOSIS — Z7189 Other specified counseling: Secondary | ICD-10-CM

## 2016-08-17 DIAGNOSIS — Z833 Family history of diabetes mellitus: Secondary | ICD-10-CM

## 2016-08-17 DIAGNOSIS — Z803 Family history of malignant neoplasm of breast: Secondary | ICD-10-CM

## 2016-08-17 DIAGNOSIS — Z66 Do not resuscitate: Secondary | ICD-10-CM | POA: Diagnosis not present

## 2016-08-17 DIAGNOSIS — J441 Chronic obstructive pulmonary disease with (acute) exacerbation: Secondary | ICD-10-CM | POA: Diagnosis not present

## 2016-08-17 DIAGNOSIS — Z87891 Personal history of nicotine dependence: Secondary | ICD-10-CM

## 2016-08-17 DIAGNOSIS — J9621 Acute and chronic respiratory failure with hypoxia: Principal | ICD-10-CM | POA: Diagnosis present

## 2016-08-17 DIAGNOSIS — R600 Localized edema: Secondary | ICD-10-CM | POA: Diagnosis present

## 2016-08-17 DIAGNOSIS — R0600 Dyspnea, unspecified: Secondary | ICD-10-CM

## 2016-08-17 DIAGNOSIS — Z841 Family history of disorders of kidney and ureter: Secondary | ICD-10-CM

## 2016-08-17 DIAGNOSIS — E876 Hypokalemia: Secondary | ICD-10-CM | POA: Diagnosis present

## 2016-08-17 DIAGNOSIS — R079 Chest pain, unspecified: Secondary | ICD-10-CM

## 2016-08-17 DIAGNOSIS — Z23 Encounter for immunization: Secondary | ICD-10-CM

## 2016-08-17 DIAGNOSIS — I10 Essential (primary) hypertension: Secondary | ICD-10-CM | POA: Diagnosis present

## 2016-08-17 DIAGNOSIS — M6281 Muscle weakness (generalized): Secondary | ICD-10-CM

## 2016-08-17 HISTORY — DX: Hyperlipidemia, unspecified: E78.5

## 2016-08-17 HISTORY — DX: Carpal tunnel syndrome, left upper limb: G56.02

## 2016-08-17 HISTORY — DX: Unspecified asthma, uncomplicated: J45.909

## 2016-08-17 HISTORY — DX: Essential (primary) hypertension: I10

## 2016-08-17 LAB — HEPATIC FUNCTION PANEL
ALBUMIN: 3.4 g/dL — AB (ref 3.5–5.0)
ALK PHOS: 41 U/L (ref 38–126)
ALT: 25 U/L (ref 14–54)
AST: 38 U/L (ref 15–41)
Total Protein: 7 g/dL (ref 6.5–8.1)

## 2016-08-17 LAB — BASIC METABOLIC PANEL
Anion gap: 10 (ref 5–15)
BUN: 15 mg/dL (ref 6–20)
CALCIUM: 8.8 mg/dL — AB (ref 8.9–10.3)
CHLORIDE: 101 mmol/L (ref 101–111)
CO2: 27 mmol/L (ref 22–32)
CREATININE: 1.37 mg/dL — AB (ref 0.44–1.00)
GFR calc Af Amer: 45 mL/min — ABNORMAL LOW (ref >60)
GFR calc non Af Amer: 39 mL/min — ABNORMAL LOW (ref >60)
GLUCOSE: 126 mg/dL — AB (ref 65–99)
Potassium: 3 mmol/L — ABNORMAL LOW (ref 3.5–5.1)
Sodium: 138 mmol/L (ref 135–145)

## 2016-08-17 LAB — CBC
HCT: 35.9 % (ref 35.0–47.0)
Hemoglobin: 12.2 g/dL (ref 12.0–16.0)
MCH: 29.1 pg (ref 26.0–34.0)
MCHC: 33.9 g/dL (ref 32.0–36.0)
MCV: 85.8 fL (ref 80.0–100.0)
PLATELETS: 251 10*3/uL (ref 150–440)
RBC: 4.18 MIL/uL (ref 3.80–5.20)
RDW: 14.8 % — AB (ref 11.5–14.5)
WBC: 10.8 10*3/uL (ref 3.6–11.0)

## 2016-08-17 LAB — BRAIN NATRIURETIC PEPTIDE: B NATRIURETIC PEPTIDE 5: 78 pg/mL (ref 0.0–100.0)

## 2016-08-17 LAB — LIPASE, BLOOD: LIPASE: 15 U/L (ref 11–51)

## 2016-08-17 LAB — TROPONIN I: Troponin I: <0.03 ng/mL (ref <0.03)

## 2016-08-17 MED ORDER — ALBUTEROL SULFATE (2.5 MG/3ML) 0.083% IN NEBU
10.0000 mg/h | INHALATION_SOLUTION | RESPIRATORY_TRACT | Status: AC
  Administered 2016-08-17: 10 mg/h via RESPIRATORY_TRACT
  Filled 2016-08-17: qty 3

## 2016-08-17 MED ORDER — METHYLPREDNISOLONE SODIUM SUCC 125 MG IJ SOLR
125.0000 mg | Freq: Once | INTRAMUSCULAR | Status: AC
  Administered 2016-08-17: 125 mg via INTRAVENOUS

## 2016-08-17 MED ORDER — IPRATROPIUM-ALBUTEROL 0.5-2.5 (3) MG/3ML IN SOLN
RESPIRATORY_TRACT | Status: AC
  Administered 2016-08-17: 3 mL via RESPIRATORY_TRACT
  Filled 2016-08-17: qty 3

## 2016-08-17 MED ORDER — IPRATROPIUM-ALBUTEROL 0.5-2.5 (3) MG/3ML IN SOLN
3.0000 mL | Freq: Once | RESPIRATORY_TRACT | Status: AC
  Administered 2016-08-17: 3 mL via RESPIRATORY_TRACT

## 2016-08-17 MED ORDER — IOPAMIDOL (ISOVUE-370) INJECTION 76%
75.0000 mL | Freq: Once | INTRAVENOUS | Status: AC | PRN
  Administered 2016-08-17: 60 mL via INTRAVENOUS

## 2016-08-17 MED ORDER — METHYLPREDNISOLONE SODIUM SUCC 125 MG IJ SOLR
INTRAMUSCULAR | Status: AC
  Administered 2016-08-17: 125 mg via INTRAVENOUS
  Filled 2016-08-17: qty 2

## 2016-08-17 MED ORDER — MAGNESIUM SULFATE 2 GM/50ML IV SOLN
2.0000 g | Freq: Once | INTRAVENOUS | Status: AC
  Administered 2016-08-17: 2 g via INTRAVENOUS
  Filled 2016-08-17: qty 50

## 2016-08-17 MED ORDER — ALBUTEROL SULFATE (2.5 MG/3ML) 0.083% IN NEBU
INHALATION_SOLUTION | RESPIRATORY_TRACT | Status: AC
  Filled 2016-08-17: qty 9

## 2016-08-17 NOTE — ED Triage Notes (Signed)
Per EMS report, patient called EMS approximately 20 minutes after increased shortness of breath started. Patient was given two duoneb treatments which relieved the chest pressure, but a sharp mid sternal chest pain remains. Patient has a history of COPD.

## 2016-08-17 NOTE — ED Provider Notes (Signed)
[facility] Emergency Department Provider Note  ____________________________________________   First MD Initiated Contact with Patient 08/17/16 2054     (approximate)  I have reviewed the triage vital signs and the nursing notes.   HISTORY  Chief Complaint Shortness of Breath and Chest Pain    HPI Audrey Sexton is a 65 y.o. female with history of severe COPD on 3 L of oxygen at baseline who presents for acute onset chest pain in addition to acute onset shortness of breath.  She states that the shortness of breath feels like her usual COPD exacerbations but the chest pain is somewhat new.  She states that she had the onset all of a sudden after she had a couple of episodes of vomiting.  She describes the pain as mild to moderate, central chest, sharp, worse with deep breaths.  She is working hard to breathe and states that her usual COPD medicine is not helping.  She denies fever/chills, abdominal pain, dysuria.  Her nausea is controlled at this time.  Rest of breath is severe and she is wheezing audibly and using accessory muscles to breathe.   Past Medical History:  Diagnosis Date  . Asthma   . Carpal tunnel syndrome of left wrist   . COPD (chronic obstructive pulmonary disease) (HCC)   . Hyperlipemia   . Hypertension     There are no active problems to display for this patient.   Past Surgical History:  Procedure Laterality Date  . CARPAL TUNNEL RELEASE Left   . fractured tibia Left    repair  . ROTATOR CUFF REPAIR Left   . TUBAL LIGATION      Prior to Admission medications   Medication Sig Start Date End Date Taking? Authorizing Provider  Acetaminophen 500 MG coapsule Take 500 mg by mouth as needed.   Yes Historical Provider, MD  albuterol (ACCUNEB) 0.63 MG/3ML nebulizer solution Inhale 3 mLs into the lungs 3 (three) times daily as needed.    Yes Historical Provider, MD  albuterol (PROVENTIL HFA;VENTOLIN HFA) 108 (90 Base) MCG/ACT inhaler  Inhale 2 puffs into the lungs every 4 (four) hours as needed.   Yes Historical Provider, MD  Ascorbic Acid (VITAMIN C) 1000 MG tablet Take 1,000 mg by mouth daily.   Yes Historical Provider, MD  atorvastatin (LIPITOR) 40 MG tablet Take 40 mg by mouth daily.   Yes Historical Provider, MD  Cholecalciferol (VITAMIN D-1000 MAX ST) 1000 units tablet Take 1,000 Units by mouth daily.   Yes Historical Provider, MD  diltiazem (CARDIZEM CD) 240 MG 24 hr capsule Take 240 mg by mouth daily.   Yes Historical Provider, MD  fluticasone furoate-vilanterol (BREO ELLIPTA) 100-25 MCG/INH AEPB Inhale 1 puff into the lungs daily.   Yes Historical Provider, MD  furosemide (LASIX) 40 MG tablet Take 40 mg by mouth daily.   Yes Historical Provider, MD  Multiple Vitamin (MULTI-VITAMIN DAILY PO) Take 1 tablet by mouth daily.   Yes Historical Provider, MD  omeprazole (PRILOSEC) 20 MG capsule Take 20 mg by mouth daily.   Yes Historical Provider, MD  oxybutynin (DITROPAN) 5 MG tablet Take 5 mg by mouth daily.   Yes Historical Provider, MD  PARoxetine (PAXIL) 20 MG tablet Take 20 mg by mouth daily.   Yes Historical Provider, MD  tiotropium (SPIRIVA) 18 MCG inhalation capsule Place 1 capsule into inhaler and inhale daily.   Yes Historical Provider, MD  vitamin B-12 (CYANOCOBALAMIN) 1000 MCG tablet Take 2,000 mcg by mouth  daily.   Yes Historical Provider, MD    Allergies Percocet [oxycodone-acetaminophen]; Vicodin [hydrocodone-acetaminophen]; and Penicillins  No family history on file.  Social History Social History  Substance Use Topics  . Smoking status: Former Smoker    Types: Cigarettes  . Smokeless tobacco: Never Used  . Alcohol use No    Review of Systems Constitutional: No fever/chills Eyes: No visual changes. ENT: No sore throat. Cardiovascular: +chest pain. Respiratory: +shortness of breath. Gastrointestinal: No abdominal pain.  N/Vx3, now resolved.  No diarrhea.  No constipation. Genitourinary: Negative  for dysuria. Musculoskeletal: Negative for back pain. Skin: Negative for rash. Neurological: Negative for headaches, focal weakness or numbness.  10-point ROS otherwise negative.  ____________________________________________   PHYSICAL EXAM:  VITAL SIGNS: ED Triage Vitals  Enc Vitals Group     BP 08/17/16 2008 119/87     Pulse Rate 08/17/16 2008 (!) 107     Resp --      Temp 08/17/16 2008 99.4 F (37.4 C)     Temp Source 08/17/16 2008 Oral     SpO2 08/17/16 2008 95 %     Weight 08/17/16 2009 210 lb (95.3 kg)     Height 08/17/16 2009 5\' 4"  (1.626 m)     Head Circumference --      Peak Flow --      Pain Score 08/17/16 2010 7     Pain Loc --      Pain Edu? --      Excl. in GC? --     Constitutional: Alert and oriented. Moderate respiratory distress, appearance of chronic illness Eyes: Conjunctivae are normal. PERRL. EOMI. Head: Atraumatic. Nose: No congestion/rhinnorhea. Neck: No stridor.  No meningeal signs.   Cardiovascular: Tachycardia, regular rhythm. Good peripheral circulation. Grossly normal heart sounds. Respiratory: Increased respiratory effort.  +Retractions. Lungs with tight breath sounds and severe wheezing throughout fields. Gastrointestinal: Soft and nontender. No distention.  Musculoskeletal: No lower extremity tenderness nor edema. No gross deformities of extremities. Neurologic:  Normal speech and language. No gross focal neurologic deficits are appreciated.  Skin:  Skin is warm, dry and intact. No rash noted. Psychiatric: Mood and affect are normal. Speech and behavior are normal.  ____________________________________________   LABS (all labs ordered are listed, but only abnormal results are displayed)  Labs Reviewed  BASIC METABOLIC PANEL - Abnormal; Notable for the following:       Result Value   Potassium 3.0 (*)    Glucose, Bld 126 (*)    Creatinine, Ser 1.37 (*)    Calcium 8.8 (*)    GFR calc non Af Amer 39 (*)    GFR calc Af Amer 45 (*)      All other components within normal limits  CBC - Abnormal; Notable for the following:    RDW 14.8 (*)    All other components within normal limits  HEPATIC FUNCTION PANEL - Abnormal; Notable for the following:    Albumin 3.4 (*)    Total Bilirubin <0.1 (*)    Bilirubin, Direct <0.1 (*)    All other components within normal limits  BRAIN NATRIURETIC PEPTIDE  TROPONIN I  LIPASE, BLOOD   ____________________________________________  EKG  ED ECG REPORT I, Shylyn Younce, the attending physician, personally viewed and interpreted this ECG.  Date: 08/17/2016 EKG Time: 20:04 Rate: 109 Rhythm: His tachycardia QRS Axis: Borderline right axis deviation Intervals: normal ST/T Wave abnormalities: Non-specific ST segment / T-wave changes, but no evidence of acute ischemia. Conduction Disturbances: none Narrative  Interpretation: unremarkable  ____________________________________________  RADIOLOGY   Dg Chest 2 View  Result Date: 08/17/2016 CLINICAL DATA:  Acute onset dyspnea tonight EXAM: CHEST  2 VIEW COMPARISON:  06/24/2014 FINDINGS: Emphysematous changes in the upper lobes, and fibrotic appearing interstitial coarsening in the central and basilar regions. No confluent airspace consolidation. No effusions. Hilar, mediastinal and cardiac contours are unremarkable and unchanged. IMPRESSION: Severe chronic emphysematous and fibrotic changes. No superimposed consolidation or effusion. Electronically Signed   By: Ellery Plunk M.D.   On: 08/17/2016 21:15   Ct Angio Chest Pe W Or Wo Contrast  Result Date: 08/17/2016 CLINICAL DATA:  Initial evaluation for increase shortness of breath, chest pain. History of COPD. EXAM: CT ANGIOGRAPHY CHEST WITH CONTRAST TECHNIQUE: Multidetector CT imaging of the chest was performed using the standard protocol during bolus administration of intravenous contrast. Multiplanar CT image reconstructions and MIPs were obtained to evaluate the vascular anatomy.  CONTRAST:  60 cc of Isovue 370. COMPARISON:  Prior radiograph from earlier the same day. FINDINGS: Cardiovascular: Intrathoracic aorta of normal caliber without acute abnormality. Scattered atheromatous plaque within the aortic arch and descending intrathoracic aorta. Visualized great vessels within normal limits. Heart size within normal limits. Coronary artery calcifications noted within the LAD. No pericardial effusion. Evaluation of the pulmonary arterial tree somewhat limited by respiratory motion artifact. Main pulmonary artery measures within normal limits for size at 2.8 cm in diameter. No large or central filling defects are identified on this exam. Evaluation for possible distal small segmental and subsegmental emboli limited on this motion degraded study. Specifically, scattered heterogeneity within the segmental branches supplying the left lower lobe felt to be artifactual on this motion degraded study. Mediastinum/Nodes: Thyroid within normal limits. Mildly enlarged precarinal node measures 11 the mm (series 5, image 30). No other pathologically enlarged mediastinal, hilar, or axillary lymph nodes identified. Esophagus within normal limits. Lungs/Pleura: Extensive emphysematous changes present throughout the lungs. Irregular linear opacity with architectural distortion within the right perihilar region favored to reflect chronic scarring and/or atelectasis. Mild atelectatic changes at the left lung base is well. No other consolidative airspace disease. No pulmonary edema or pleural effusion. No pneumothorax. 8 mm subpleural nodule at the left lower lobe is stable from prior CT from 06/13/2014, and likely benign (series 7, image 57). No other worrisome pulmonary nodule or mass identified. There is a. 7 mm nodular density with associated scarring/atelectasis within the right lower lobe (series 5, image 49). Upper Abdomen: Visualized upper abdomen within normal limits. Musculoskeletal: No acute osseous  abnormality. No worrisome lytic or blastic osseous lesions. Large disc osteophyte complex within the mid thoracic spine noted, stable from previous. Review of the MIP images confirms the above findings. IMPRESSION: 1. No large or central pulmonary embolism identified. Please note that evaluation for possible distal small subsegmental emboli limited on this exam due to respiratory motion artifact. 2. No other acute cardiopulmonary abnormality identified. 3. Severe emphysema with right perihilar and bibasilar atelectasis/scarring. 4. 11 mm precarinal lymph node, stable from 2015. No other adenopathy within the chest. 5. 8 mm subpleural left lower lobe nodule, stable from 2015. This is likely benign given its stability. 6. **An incidental finding of potential clinical significance has been found. 7 mm nodular density with associated atelectasis/ scarring within the right lower lobe as above. Non-contrast chest CT at 6-12 months is recommended. If the nodule is stable at time of repeat CT, then future CT at 18-24 months (from today's scan) is considered optional for low-risk patients, but  is recommended for high-risk patients. This recommendation follows the consensus statement: Guidelines for Management of Incidental Pulmonary Nodules Detected on CT Images: From the Fleischner Society 2017; Radiology 2017; 284:228-243.** Electronically Signed   By: Rise MuBenjamin  McClintock M.D.   On: 08/17/2016 23:03    ____________________________________________   PROCEDURES  Procedure(s) performed:   Procedures   Critical Care performed: No ____________________________________________   INITIAL IMPRESSION / ASSESSMENT AND PLAN / ED COURSE  Pertinent labs & imaging results that were available during my care of the patient were reviewed by me and considered in my medical decision making (see chart for details).  The patient's appears to be having a severe COPD exacerbation.  However I am somewhat concerned about her  chest pain.  However I will see if her symptoms resolve after a fall 3 DuoNeb's, Solu-Medrol, and magnesium 2 g IV.  At this point, however, due to her increased work of breathing or retractions or suspect she will require admission.   Clinical Course as of Aug 17 2346  Thu Aug 17, 2016  2205 After another 10 mg of albuterol via nebulizer, the patient still has mild retractions and increased work of breathing.  She still has severe expiratory wheezing and tightness throughout all lung fields.  She is already also gotten the magnesium sulfate.  I believe she needs admission for COPD exacerbation but in the meantime I will evaluate with a CT angios chest given the acute onset of chest pain which is new for her and the fact that the shortness of breath started after this acute episode.  [CF]  2346 The patient has no evidence of acute pulmonary embolism on CT.  I will admit for COPD exacerbation.  There is no indication for BiPAP at this time although she may require she does not turnaround with continuous albuterol.  [CF]    Clinical Course User Index [CF] Loleta Roseory Lynea Rollison, MD    ____________________________________________  FINAL CLINICAL IMPRESSION(S) / ED DIAGNOSES  Final diagnoses:  COPD exacerbation (HCC)  Chest pain, unspecified type     MEDICATIONS GIVEN DURING THIS VISIT:  Medications  methylPREDNISolone sodium succinate (SOLU-MEDROL) 125 mg/2 mL injection 125 mg (125 mg Intravenous Given 08/17/16 2011)  ipratropium-albuterol (DUONEB) 0.5-2.5 (3) MG/3ML nebulizer solution 3 mL (3 mLs Nebulization Given 08/17/16 2011)  albuterol (PROVENTIL) (2.5 MG/3ML) 0.083% nebulizer solution (10 mg/hr Nebulization Given 08/17/16 2131)  magnesium sulfate IVPB 2 g 50 mL (0 g Intravenous Stopped 08/17/16 2308)  iopamidol (ISOVUE-370) 76 % injection 75 mL (60 mLs Intravenous Contrast Given 08/17/16 2226)     NEW OUTPATIENT MEDICATIONS STARTED DURING THIS VISIT:  New Prescriptions   No medications  on file    Modified Medications   No medications on file    Discontinued Medications   TRAMADOL (ULTRAM) 50 MG TABLET    Take 1 tablet (50 mg total) by mouth every 6 (six) hours as needed.     Note:  This document was prepared using Dragon voice recognition software and may include unintentional dictation errors.    Loleta Roseory Tehya Leath, MD 08/17/16 [phone]

## 2016-08-17 NOTE — ED Notes (Addendum)
Patient states she has had some improvement with the albuterol treatment and duo neb treatment, but states she still feels dyspneic and unable to have a productive cough. Dr. York CeriseForbach aware.

## 2016-08-18 ENCOUNTER — Encounter: Payer: Self-pay | Admitting: Family Medicine

## 2016-08-18 DIAGNOSIS — R072 Precordial pain: Secondary | ICD-10-CM | POA: Diagnosis present

## 2016-08-18 DIAGNOSIS — Z23 Encounter for immunization: Secondary | ICD-10-CM | POA: Diagnosis present

## 2016-08-18 DIAGNOSIS — J209 Acute bronchitis, unspecified: Secondary | ICD-10-CM | POA: Diagnosis not present

## 2016-08-18 DIAGNOSIS — Z7189 Other specified counseling: Secondary | ICD-10-CM | POA: Diagnosis not present

## 2016-08-18 DIAGNOSIS — K5900 Constipation, unspecified: Secondary | ICD-10-CM | POA: Diagnosis present

## 2016-08-18 DIAGNOSIS — E119 Type 2 diabetes mellitus without complications: Secondary | ICD-10-CM | POA: Diagnosis present

## 2016-08-18 DIAGNOSIS — Z794 Long term (current) use of insulin: Secondary | ICD-10-CM | POA: Diagnosis not present

## 2016-08-18 DIAGNOSIS — Z8249 Family history of ischemic heart disease and other diseases of the circulatory system: Secondary | ICD-10-CM | POA: Diagnosis not present

## 2016-08-18 DIAGNOSIS — R600 Localized edema: Secondary | ICD-10-CM | POA: Diagnosis present

## 2016-08-18 DIAGNOSIS — Z833 Family history of diabetes mellitus: Secondary | ICD-10-CM | POA: Diagnosis not present

## 2016-08-18 DIAGNOSIS — J449 Chronic obstructive pulmonary disease, unspecified: Secondary | ICD-10-CM | POA: Diagnosis present

## 2016-08-18 DIAGNOSIS — I10 Essential (primary) hypertension: Secondary | ICD-10-CM | POA: Diagnosis present

## 2016-08-18 DIAGNOSIS — Z803 Family history of malignant neoplasm of breast: Secondary | ICD-10-CM | POA: Diagnosis not present

## 2016-08-18 DIAGNOSIS — R0603 Acute respiratory distress: Secondary | ICD-10-CM | POA: Diagnosis not present

## 2016-08-18 DIAGNOSIS — E785 Hyperlipidemia, unspecified: Secondary | ICD-10-CM | POA: Diagnosis present

## 2016-08-18 DIAGNOSIS — Z87891 Personal history of nicotine dependence: Secondary | ICD-10-CM | POA: Diagnosis not present

## 2016-08-18 DIAGNOSIS — J9621 Acute and chronic respiratory failure with hypoxia: Secondary | ICD-10-CM | POA: Diagnosis present

## 2016-08-18 DIAGNOSIS — Z66 Do not resuscitate: Secondary | ICD-10-CM | POA: Diagnosis not present

## 2016-08-18 DIAGNOSIS — Z820 Family history of epilepsy and other diseases of the nervous system: Secondary | ICD-10-CM | POA: Diagnosis not present

## 2016-08-18 DIAGNOSIS — Z515 Encounter for palliative care: Secondary | ICD-10-CM | POA: Diagnosis not present

## 2016-08-18 DIAGNOSIS — R911 Solitary pulmonary nodule: Secondary | ICD-10-CM | POA: Diagnosis not present

## 2016-08-18 DIAGNOSIS — J441 Chronic obstructive pulmonary disease with (acute) exacerbation: Secondary | ICD-10-CM | POA: Diagnosis present

## 2016-08-18 DIAGNOSIS — Z841 Family history of disorders of kidney and ureter: Secondary | ICD-10-CM | POA: Diagnosis not present

## 2016-08-18 DIAGNOSIS — Z9981 Dependence on supplemental oxygen: Secondary | ICD-10-CM | POA: Diagnosis not present

## 2016-08-18 DIAGNOSIS — Z79899 Other long term (current) drug therapy: Secondary | ICD-10-CM | POA: Diagnosis not present

## 2016-08-18 DIAGNOSIS — E876 Hypokalemia: Secondary | ICD-10-CM | POA: Diagnosis present

## 2016-08-18 LAB — BASIC METABOLIC PANEL
Anion gap: 15 (ref 5–15)
BUN: 15 mg/dL (ref 6–20)
CALCIUM: 9 mg/dL (ref 8.9–10.3)
CHLORIDE: 101 mmol/L (ref 101–111)
CO2: 24 mmol/L (ref 22–32)
CREATININE: 1.5 mg/dL — AB (ref 0.44–1.00)
GFR calc non Af Amer: 35 mL/min — ABNORMAL LOW (ref >60)
GFR, EST AFRICAN AMERICAN: 40 mL/min — AB (ref >60)
Glucose, Bld: 304 mg/dL — ABNORMAL HIGH (ref 65–99)
Potassium: 3.1 mmol/L — ABNORMAL LOW (ref 3.5–5.1)
SODIUM: 140 mmol/L (ref 135–145)

## 2016-08-18 LAB — CBC
HCT: 36.7 % (ref 35.0–47.0)
Hemoglobin: 12.3 g/dL (ref 12.0–16.0)
MCH: 29.3 pg (ref 26.0–34.0)
MCHC: 33.6 g/dL (ref 32.0–36.0)
MCV: 87.3 fL (ref 80.0–100.0)
PLATELETS: 245 10*3/uL (ref 150–440)
RBC: 4.2 MIL/uL (ref 3.80–5.20)
RDW: 15 % — AB (ref 11.5–14.5)
WBC: 9.3 10*3/uL (ref 3.6–11.0)

## 2016-08-18 LAB — GLUCOSE, CAPILLARY
GLUCOSE-CAPILLARY: 193 mg/dL — AB (ref 65–99)
GLUCOSE-CAPILLARY: 244 mg/dL — AB (ref 65–99)
GLUCOSE-CAPILLARY: 219 mg/dL — AB (ref 65–99)
Glucose-Capillary: 240 mg/dL — ABNORMAL HIGH (ref 65–99)

## 2016-08-18 LAB — TROPONIN I
Troponin I: <0.03 ng/mL (ref <0.03)
Troponin I: <0.03 ng/mL (ref <0.03)

## 2016-08-18 LAB — MAGNESIUM: Magnesium: 2.1 mg/dL (ref 1.7–2.4)

## 2016-08-18 LAB — PROCALCITONIN: Procalcitonin: <0.1 ng/mL

## 2016-08-18 MED ORDER — DEXTROSE 5 % IV SOLN
500.0000 mg | INTRAVENOUS | Status: DC
  Filled 2016-08-18 (×2): qty 500

## 2016-08-18 MED ORDER — ACETAMINOPHEN 325 MG PO TABS
650.0000 mg | ORAL_TABLET | Freq: Four times a day (QID) | ORAL | Status: DC | PRN
  Administered 2016-08-18 – 2016-08-24 (×4): 650 mg via ORAL
  Filled 2016-08-18 (×4): qty 2

## 2016-08-18 MED ORDER — ACETAMINOPHEN 650 MG RE SUPP: 650.0000 mg | Freq: Four times a day (QID) | RECTAL | Status: DC | PRN

## 2016-08-18 MED ORDER — ONDANSETRON HCL 4 MG/2ML IJ SOLN
4.0000 mg | Freq: Four times a day (QID) | INTRAMUSCULAR | Status: DC | PRN
  Administered 2016-08-18 – 2016-08-20 (×2): 4 mg via INTRAVENOUS
  Filled 2016-08-18 (×2): qty 2

## 2016-08-18 MED ORDER — POTASSIUM CHLORIDE CRYS ER 20 MEQ PO TBCR
40.0000 meq | EXTENDED_RELEASE_TABLET | Freq: Once | ORAL | Status: AC
  Administered 2016-08-18: 40 meq via ORAL
  Filled 2016-08-18: qty 2

## 2016-08-18 MED ORDER — PAROXETINE HCL 20 MG PO TABS
20.0000 mg | ORAL_TABLET | Freq: Every day | ORAL | Status: DC
  Administered 2016-08-18 – 2016-08-24 (×7): 20 mg via ORAL
  Filled 2016-08-18 (×7): qty 1

## 2016-08-18 MED ORDER — AZITHROMYCIN 500 MG IV SOLR
500.0000 mg | INTRAVENOUS | Status: DC
  Administered 2016-08-18 – 2016-08-20 (×3): 500 mg via INTRAVENOUS
  Filled 2016-08-18 (×3): qty 500

## 2016-08-18 MED ORDER — PANTOPRAZOLE SODIUM 40 MG PO TBEC
40.0000 mg | DELAYED_RELEASE_TABLET | Freq: Every day | ORAL | Status: DC
  Administered 2016-08-18 – 2016-08-24 (×7): 40 mg via ORAL
  Filled 2016-08-18 (×7): qty 1

## 2016-08-18 MED ORDER — FLUTICASONE FUROATE-VILANTEROL 100-25 MCG/INH IN AEPB
1.0000 | INHALATION_SPRAY | Freq: Every day | RESPIRATORY_TRACT | Status: DC
  Administered 2016-08-18 – 2016-08-24 (×7): 1 via RESPIRATORY_TRACT
  Filled 2016-08-18 (×2): qty 28

## 2016-08-18 MED ORDER — FUROSEMIDE 40 MG PO TABS
40.0000 mg | ORAL_TABLET | Freq: Every day | ORAL | Status: DC
  Administered 2016-08-18 – 2016-08-24 (×7): 40 mg via ORAL
  Filled 2016-08-18 (×7): qty 1

## 2016-08-18 MED ORDER — METHYLPREDNISOLONE SODIUM SUCC 125 MG IJ SOLR
60.0000 mg | Freq: Four times a day (QID) | INTRAMUSCULAR | Status: DC
  Administered 2016-08-18 – 2016-08-20 (×9): 60 mg via INTRAVENOUS
  Filled 2016-08-18 (×9): qty 2

## 2016-08-18 MED ORDER — ENOXAPARIN SODIUM 40 MG/0.4ML ~~LOC~~ SOLN
40.0000 mg | Freq: Every day | SUBCUTANEOUS | Status: DC
  Administered 2016-08-18 – 2016-08-23 (×7): 40 mg via SUBCUTANEOUS
  Filled 2016-08-18 (×7): qty 0.4

## 2016-08-18 MED ORDER — VITAMIN C 500 MG PO TABS
1000.0000 mg | ORAL_TABLET | Freq: Every day | ORAL | Status: DC
  Administered 2016-08-18 – 2016-08-24 (×7): 1000 mg via ORAL
  Filled 2016-08-18 (×7): qty 2

## 2016-08-18 MED ORDER — ONDANSETRON HCL 4 MG PO TABS: 4.0000 mg | ORAL_TABLET | Freq: Four times a day (QID) | ORAL | Status: DC | PRN

## 2016-08-18 MED ORDER — BISACODYL 5 MG PO TBEC
5.0000 mg | DELAYED_RELEASE_TABLET | Freq: Every day | ORAL | Status: DC | PRN
  Administered 2016-08-21: 5 mg via ORAL
  Filled 2016-08-18: qty 1

## 2016-08-18 MED ORDER — SODIUM CHLORIDE 0.9 % IV SOLN
INTRAVENOUS | Status: DC
  Administered 2016-08-18: 02:00:00 via INTRAVENOUS

## 2016-08-18 MED ORDER — ATORVASTATIN CALCIUM 20 MG PO TABS
40.0000 mg | ORAL_TABLET | Freq: Every day | ORAL | Status: DC
  Administered 2016-08-18 – 2016-08-24 (×7): 40 mg via ORAL
  Filled 2016-08-18 (×7): qty 2

## 2016-08-18 MED ORDER — IPRATROPIUM-ALBUTEROL 0.5-2.5 (3) MG/3ML IN SOLN
3.0000 mL | Freq: Four times a day (QID) | RESPIRATORY_TRACT | Status: DC
  Administered 2016-08-18 – 2016-08-22 (×18): 3 mL via RESPIRATORY_TRACT
  Filled 2016-08-18 (×19): qty 3

## 2016-08-18 MED ORDER — INSULIN ASPART 100 UNIT/ML ~~LOC~~ SOLN
0.0000 [IU] | Freq: Every day | SUBCUTANEOUS | Status: DC
  Administered 2016-08-18: 2 [IU] via SUBCUTANEOUS
  Filled 2016-08-18: qty 2

## 2016-08-18 MED ORDER — OXYBUTYNIN CHLORIDE 5 MG PO TABS
5.0000 mg | ORAL_TABLET | Freq: Every day | ORAL | Status: DC
  Administered 2016-08-18 – 2016-08-24 (×7): 5 mg via ORAL
  Filled 2016-08-18 (×7): qty 1

## 2016-08-18 MED ORDER — ZOLPIDEM TARTRATE 5 MG PO TABS: 5.0000 mg | ORAL_TABLET | Freq: Every evening | ORAL | Status: DC | PRN

## 2016-08-18 MED ORDER — ALBUTEROL SULFATE (2.5 MG/3ML) 0.083% IN NEBU
2.5000 mg | INHALATION_SOLUTION | Freq: Four times a day (QID) | RESPIRATORY_TRACT | Status: DC | PRN
  Administered 2016-08-18: 2.5 mg via RESPIRATORY_TRACT
  Filled 2016-08-18: qty 3

## 2016-08-18 MED ORDER — DILTIAZEM HCL ER COATED BEADS 120 MG PO CP24
240.0000 mg | ORAL_CAPSULE | Freq: Every day | ORAL | Status: DC
  Administered 2016-08-18: 240 mg via ORAL
  Filled 2016-08-18: qty 2

## 2016-08-18 MED ORDER — VITAMIN B-12 1000 MCG PO TABS
2000.0000 ug | ORAL_TABLET | Freq: Every day | ORAL | Status: DC
  Administered 2016-08-18 – 2016-08-24 (×7): 2000 ug via ORAL
  Filled 2016-08-18 (×7): qty 2

## 2016-08-18 MED ORDER — INSULIN ASPART 100 UNIT/ML ~~LOC~~ SOLN
0.0000 [IU] | Freq: Three times a day (TID) | SUBCUTANEOUS | Status: DC
  Administered 2016-08-18: 5 [IU] via SUBCUTANEOUS
  Filled 2016-08-18: qty 5

## 2016-08-18 MED ORDER — GUAIFENESIN-DM 100-10 MG/5ML PO SYRP
10.0000 mL | ORAL_SOLUTION | Freq: Four times a day (QID) | ORAL | Status: DC | PRN
  Administered 2016-08-18 – 2016-08-19 (×4): 10 mL via ORAL
  Filled 2016-08-18 (×4): qty 10

## 2016-08-18 MED ORDER — TIOTROPIUM BROMIDE MONOHYDRATE 18 MCG IN CAPS
1.0000 | ORAL_CAPSULE | Freq: Every day | RESPIRATORY_TRACT | Status: DC
  Administered 2016-08-18 – 2016-08-24 (×7): 18 ug via RESPIRATORY_TRACT
  Filled 2016-08-18 (×2): qty 5

## 2016-08-18 MED ORDER — MAGNESIUM CITRATE PO SOLN
1.0000 | Freq: Once | ORAL | Status: AC | PRN
  Administered 2016-08-24: 1 via ORAL
  Filled 2016-08-18: qty 296

## 2016-08-18 MED ORDER — SENNOSIDES-DOCUSATE SODIUM 8.6-50 MG PO TABS
1.0000 | ORAL_TABLET | Freq: Every evening | ORAL | Status: DC | PRN
  Administered 2016-08-21: 1 via ORAL
  Filled 2016-08-18: qty 1

## 2016-08-18 MED ORDER — VITAMIN D 1000 UNITS PO TABS
1000.0000 [IU] | ORAL_TABLET | Freq: Every day | ORAL | Status: DC
  Administered 2016-08-18 – 2016-08-24 (×7): 1000 [IU] via ORAL
  Filled 2016-08-18 (×7): qty 1

## 2016-08-18 MED ORDER — INFLUENZA VAC SPLIT QUAD 0.5 ML IM SUSY
0.5000 mL | PREFILLED_SYRINGE | INTRAMUSCULAR | Status: AC
  Administered 2016-08-20: 0.5 mL via INTRAMUSCULAR
  Filled 2016-08-18: qty 0.5

## 2016-08-18 NOTE — Progress Notes (Signed)
[facility] [place] [facility]   PATIENT NAME: Audrey Hammockrlene Sexton    MR#:  [id number]  DATE OF BIRTH:  Jul 11, 1951  SUBJECTIVE:  CHIEF COMPLAINT:  Patient is reporting cough and shortness of breath with exertion  REVIEW OF SYSTEMS:  CONSTITUTIONAL: No fever, fatigue or weakness.  EYES: No blurred or double vision.  EARS, NOSE, AND THROAT: No tinnitus or ear pain.  RESPIRATORY: Reporting cough, shortness of breath, denies wheezing or hemoptysis.  CARDIOVASCULAR: No chest pain, orthopnea, edema.  GASTROINTESTINAL: No nausea, vomiting, diarrhea or abdominal pain.  GENITOURINARY: No dysuria, hematuria.  ENDOCRINE: No polyuria, nocturia,  HEMATOLOGY: No anemia, easy bruising or bleeding SKIN: No rash or lesion. MUSCULOSKELETAL: No joint pain or arthritis.   NEUROLOGIC: No tingling, numbness, weakness.  PSYCHIATRY: No anxiety or depression.   DRUG ALLERGIES:   Allergies  Allergen Reactions  . Percocet [Oxycodone-Acetaminophen] Nausea And Vomiting  . Vicodin [Hydrocodone-Acetaminophen] Nausea And Vomiting  . Penicillins Hives    Has patient had a PCN reaction causing immediate rash, facial/tongue/throat swelling, SOB or lightheadedness with hypotension: no Has patient had a PCN reaction causing severe rash involving mucus membranes or skin necrosis: no Has patient had a PCN reaction that required hospitalization  no Has patient had a PCN reaction occurring within the last 10 years: no If all of the above answers are "NO", then may proceed with Cephalosporin use.     VITALS:  Blood pressure (!) 154/68, pulse (!) 101, temperature 98.6 F (37 C), temperature source Oral, resp. rate 18, height 5\' 4"  (1.626 m), weight 95.3 kg (210 lb), SpO2 92 %.  PHYSICAL EXAMINATION:  GENERAL:  65 y.o.-year-old patient lying in the bed with no acute distress.  EYES: Pupils equal, round, reactive to light and accommodation. No scleral icterus. Extraocular muscles intact.   HEENT: Head atraumatic, normocephalic. Oropharynx and nasopharynx clear.  NECK:  Supple, no jugular venous distention. No thyroid enlargement, no tenderness.  LUNGS: Diminished breath sounds bilaterally, end expiratory wheezing, rales,rhonchi or crepitation. No use of accessory muscles of respiration.  CARDIOVASCULAR: S1, S2 normal. No murmurs, rubs, or gallops.  ABDOMEN: Soft, nontender, nondistended. Bowel sounds present. No organomegaly or mass.  EXTREMITIES: No pedal edema, cyanosis, or clubbing.  NEUROLOGIC: Cranial nerves II through XII are intact. Muscle strength 5/5 in all extremities. Sensation intact. Gait not checked.  PSYCHIATRIC: The patient is alert and oriented x 3.  SKIN: No obvious rash, lesion, or ulcer.    LABORATORY PANEL:   CBC  Recent Labs Lab 08/18/16 0351  WBC 9.3  HGB 12.3  HCT 36.7  PLT 245   ------------------------------------------------------------------------------------------------------------------  Chemistries   Recent Labs Lab 08/17/16 2012 08/18/16 0351  NA 138 140  K 3.0* 3.1*  CL 101 101  CO2 27 24  GLUCOSE 126* 304*  BUN 15 15  CREATININE 1.37* 1.50*  CALCIUM 8.8* 9.0  MG  --  2.1  AST 38  --   ALT 25  --   ALKPHOS 41  --   BILITOT <0.1*  --    ------------------------------------------------------------------------------------------------------------------  Cardiac Enzymes  Recent Labs Lab 08/18/16 1334  TROPONINI <0.03   ------------------------------------------------------------------------------------------------------------------  RADIOLOGY:  Dg Chest 2 View  Result Date: 08/17/2016 CLINICAL DATA:  Acute onset dyspnea tonight EXAM: CHEST  2 VIEW COMPARISON:  06/24/2014 FINDINGS: Emphysematous changes in the upper lobes, and fibrotic appearing interstitial coarsening in the central and basilar regions. No confluent airspace consolidation. No effusions. Hilar, mediastinal and cardiac contours are unremarkable and  unchanged. IMPRESSION: Severe chronic emphysematous and fibrotic changes. No superimposed consolidation or effusion. Electronically Signed   By: Ellery Plunkaniel R Mitchell M.D.   On: 08/17/2016 21:15   Ct Angio Chest Pe W Or Wo Contrast  Result Date: 08/17/2016 CLINICAL DATA:  Initial evaluation for increase shortness of breath, chest pain. History of COPD. EXAM: CT ANGIOGRAPHY CHEST WITH CONTRAST TECHNIQUE: Multidetector CT imaging of the chest was performed using the standard protocol during bolus administration of intravenous contrast. Multiplanar CT image reconstructions and MIPs were obtained to evaluate the vascular anatomy. CONTRAST:  60 cc of Isovue 370. COMPARISON:  Prior radiograph from earlier the same day. FINDINGS: Cardiovascular: Intrathoracic aorta of normal caliber without acute abnormality. Scattered atheromatous plaque within the aortic arch and descending intrathoracic aorta. Visualized great vessels within normal limits. Heart size within normal limits. Coronary artery calcifications noted within the LAD. No pericardial effusion. Evaluation of the pulmonary arterial tree somewhat limited by respiratory motion artifact. Main pulmonary artery measures within normal limits for size at 2.8 cm in diameter. No large or central filling defects are identified on this exam. Evaluation for possible distal small segmental and subsegmental emboli limited on this motion degraded study. Specifically, scattered heterogeneity within the segmental branches supplying the left lower lobe felt to be artifactual on this motion degraded study. Mediastinum/Nodes: Thyroid within normal limits. Mildly enlarged precarinal node measures 11 the mm (series 5, image 30). No other pathologically enlarged mediastinal, hilar, or axillary lymph nodes identified. Esophagus within normal limits. Lungs/Pleura: Extensive emphysematous changes present throughout the lungs. Irregular linear opacity with architectural distortion within the  right perihilar region favored to reflect chronic scarring and/or atelectasis. Mild atelectatic changes at the left lung base is well. No other consolidative airspace disease. No pulmonary edema or pleural effusion. No pneumothorax. 8 mm subpleural nodule at the left lower lobe is stable from prior CT from 06/13/2014, and likely benign (series 7, image 57). No other worrisome pulmonary nodule or mass identified. There is a. 7 mm nodular density with associated scarring/atelectasis within the right lower lobe (series 5, image 49). Upper Abdomen: Visualized upper abdomen within normal limits. Musculoskeletal: No acute osseous abnormality. No worrisome lytic or blastic osseous lesions. Large disc osteophyte complex within the mid thoracic spine noted, stable from previous. Review of the MIP images confirms the above findings. IMPRESSION: 1. No large or central pulmonary embolism identified. Please note that evaluation for possible distal small subsegmental emboli limited on this exam due to respiratory motion artifact. 2. No other acute cardiopulmonary abnormality identified. 3. Severe emphysema with right perihilar and bibasilar atelectasis/scarring. 4. 11 mm precarinal lymph node, stable from 2015. No other adenopathy within the chest. 5. 8 mm subpleural left lower lobe nodule, stable from 2015. This is likely benign given its stability. 6. **An incidental finding of potential clinical significance has been found. 7 mm nodular density with associated atelectasis/ scarring within the right lower lobe as above. Non-contrast chest CT at 6-12 months is recommended. If the nodule is stable at time of repeat CT, then future CT at 18-24 months (from today's scan) is considered optional for low-risk patients, but is recommended for high-risk patients. This recommendation follows the consensus statement: Guidelines for Management of Incidental Pulmonary Nodules Detected on CT Images: From the Fleischner Society 2017;  Radiology 2017; 284:228-243.** Electronically Signed   By: Rise MuBenjamin  McClintock M.D.   On: 08/17/2016 23:03    EKG:   Orders placed or performed during the hospital encounter of 08/17/16  . ED  EKG  . ED EKG  . EKG 12-Lead  . EKG 12-Lead    ASSESSMENT AND PLAN:   This is a 65 y.o. female with a history of home O2 dependent COPD, asthma, hypertension, hyperlipidemia  now being admitted with:  1. Acute exacerbation of COPD - Still reporting shortness of breath continue IV steroids and azithromycin -Continue Spiriva and Breo Ellipta - Nebulizers scheduled and albuterol as needed O2 therapy and expectorants as needed.  - Consider pulmonary consult if not improving.   2. hypokalemia-replace by mouth. Respiratory 0.1. Check BMP in a.m.  3.  chest pain, likely secondary to COPD exacerbation and cough -Ruled out acute MI troponins 3 are negative  -Telemetry  4. History of hypertension-continue Cardizem  5. History of hyperlipidemia-continue Lipitor 6. History of lower extremity edema-continue Lasix     All the records are reviewed and case discussed with Care Management/Social Workerr. Management plans discussed with the patient, family and they are in agreement.  CODE STATUS: Full  TOTAL TIME TAKING CARE OF THIS PATIENT: .   POSSIBLE D/C IN 2 DAYS, DEPENDING ON CLINICAL CONDITION.  Note: This dictation was prepared with Dragon dictation along with smaller phrase technology. Any transcriptional errors that result from this process are unintentional.   Ramonita Lab M.D on 08/18/2016 at 2:38 PM  Between 7am to 6pm - Pager - [phone] After 6pm go to [email or web address] - password [email or web address] [facility]  [facility]  Office  [phone]  CC: Primary care physician; Oswaldo Conroy, MD

## 2016-08-18 NOTE — H&P (Addendum)
History and Physical   [facility] [place] [facility] Admission History and Physical [employer], D.O.    Patient Name: Audrey Sexton MR#: [id number] Date of Birth: 01-Oct-1950 Date of Admission: 08/17/2016  Referring MD/NP/PA: York Cerise Primary Care Physician: Oswaldo Conroy, MD  Patient coming from: Home  Chief Complaint: SOB  HPI: Audrey Sexton is a 65 y.o. 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  Patient was in a usual state of health until this afternoon when she developed the sudden onset of shortness of breath associated with nonproductive cough, mild nausea with her coughing and new central chest pain that is worse with breathing. She reports that she has not taken any nebulizer therapy at home for this episode and she has not sought any other medical help prior to her arrival in the emergency department this evening..   Otherwise there has been no change in status. Patient has been taking medication as prescribed and there has been no recent change in medication or diet.  There has been no recent illness, travel or sick contacts.    Patient denies fevers/chills, weakness, dizziness, vomiting, constipation, diarrhea, abdominal pain, dysuria/frequency, changes in mental status.   ED Course: Patient received Solu-Medrol, nebulizers, magensium  Review of Systems:  CONSTITUTIONAL: No fever/chills, fatigue, weakness, weight gain/loss, headache. EYES: No blurry or double vision. ENT: No tinnitus, postnasal drip, redness or soreness of the oropharynx. RESPIRATORY: Positive cough, dyspnea, wheeze, negative hemoptysis.  CARDIOVASCULAR: Positive chest pain with cough, palpitations, syncope, orthopnea,  GASTROINTESTINAL: No nausea, vomiting, abdominal pain, constipation, diarrhea.  No hematemesis, melena or  hematochezia. GENITOURINARY: No dysuria, frequency, hematuria. ENDOCRINE: No polyuria or nocturia. No heat or cold intolerance. HEMATOLOGY: No anemia, bruising, bleeding. INTEGUMENTARY: No rashes, ulcers, lesions. MUSCULOSKELETAL: No arthritis, gout, dyspnea.  NEUROLOGIC: No numbness, tingling, ataxia, seizure-type activity, weakness. PSYCHIATRIC: No anxiety, depression, insomnia.   Past Medical History:  Diagnosis Date  . Asthma   . Carpal tunnel syndrome of left wrist   . COPD (chronic obstructive pulmonary disease) (HCC)   . Hyperlipemia   . Hypertension     Past Surgical History:  Procedure Laterality Date  . CARPAL TUNNEL RELEASE Left   . fractured tibia Left    repair  . ROTATOR CUFF REPAIR Left   . TUBAL LIGATION       reports that she has quit smoking. Her smoking use included Cigarettes. She has never used smokeless tobacco. She reports that she does not drink alcohol. Her drug history is not on file.  Allergies  Allergen Reactions  . Percocet [Oxycodone-Acetaminophen] Nausea And Vomiting  . Vicodin [Hydrocodone-Acetaminophen] Nausea And Vomiting  . Penicillins Hives    Has patient had a PCN reaction causing immediate rash, facial/tongue/throat swelling, SOB or lightheadedness with hypotension: no Has patient had a PCN reaction causing severe rash involving mucus membranes or skin necrosis: no Has patient had a PCN reaction that required hospitalization  no Has patient had a PCN reaction occurring within the last 10 years: no If all of the above answers are "NO", then may proceed with Cephalosporin use.     Family History  Problem Relation Age of Onset  . Heart disease Mother     died at 81  . Diabetes Mother   . Alzheimer's disease Father     died at 69  . Parkinson's disease Sister   . Heart disease  Brother     died at 6360  . Diabetes Brother   . Kidney disease Sister   . Breast cancer Sister    Family history has been reviewed and confirmed with  patient.   Prior to Admission medications   Medication Sig Start Date End Date Taking? Authorizing Provider  Acetaminophen 500 MG coapsule Take 500 mg by mouth as needed.   Yes Historical Provider, MD  albuterol (ACCUNEB) 0.63 MG/3ML nebulizer solution Inhale 3 mLs into the lungs 3 (three) times daily as needed.    Yes Historical Provider, MD  albuterol (PROVENTIL HFA;VENTOLIN HFA) 108 (90 Base) MCG/ACT inhaler Inhale 2 puffs into the lungs every 4 (four) hours as needed.   Yes Historical Provider, MD  Ascorbic Acid (VITAMIN C) 1000 MG tablet Take 1,000 mg by mouth daily.   Yes Historical Provider, MD  atorvastatin (LIPITOR) 40 MG tablet Take 40 mg by mouth daily.   Yes Historical Provider, MD  Cholecalciferol (VITAMIN D-1000 MAX ST) 1000 units tablet Take 1,000 Units by mouth daily.   Yes Historical Provider, MD  diltiazem (CARDIZEM CD) 240 MG 24 hr capsule Take 240 mg by mouth daily.   Yes Historical Provider, MD  fluticasone furoate-vilanterol (BREO ELLIPTA) 100-25 MCG/INH AEPB Inhale 1 puff into the lungs daily.   Yes Historical Provider, MD  furosemide (LASIX) 40 MG tablet Take 40 mg by mouth daily.   Yes Historical Provider, MD  Multiple Vitamin (MULTI-VITAMIN DAILY PO) Take 1 tablet by mouth daily.   Yes Historical Provider, MD  omeprazole (PRILOSEC) 20 MG capsule Take 20 mg by mouth daily.   Yes Historical Provider, MD  oxybutynin (DITROPAN) 5 MG tablet Take 5 mg by mouth daily.   Yes Historical Provider, MD  PARoxetine (PAXIL) 20 MG tablet Take 20 mg by mouth daily.   Yes Historical Provider, MD  tiotropium (SPIRIVA) 18 MCG inhalation capsule Place 1 capsule into inhaler and inhale daily.   Yes Historical Provider, MD  vitamin B-12 (CYANOCOBALAMIN) 1000 MCG tablet Take 2,000 mcg by mouth daily.   Yes Historical Provider, MD    Physical Exam: Vitals:   08/18/16 0000 08/18/16 0030 08/18/16 0100 08/18/16 0142  BP: (!) 163/73 (!) 176/66 (!) 166/79 126/75  Pulse: (!) 116 (!) 114 (!) 116  (!) 111  Resp: (!) 28 19 (!) 26 20  Temp:    98.8 F (37.1 C)  TempSrc:    Oral  SpO2: 95% 96% 95% 96%  Weight:      Height:        GENERAL: 65 y.o.-year-old White female patient, well-developed, well-nourished lying in the bed in no acute distress.  Pleasant and cooperative.  Chronically ill-appearing HEENT: Head atraumatic, normocephalic. Pupils equal, round, reactive to light and accommodation. No scleral icterus. Extraocular muscles intact. Nares are patent. Oropharynx is clear. Mucus membranes moist. Very poor dentition NECK: Supple, full range of motion. No JVD, no bruit heard. No thyroid enlargement, no tenderness, no cervical lymphadenopathy. CHEST: Decreased breath sounds at the bases with mild diffuse wheezing. No use of accessory  muscles of respiration.  No reproducible chest wall tenderness.  Patient able to speak in full sentences but does get winded. CARDIOVASCULAR: S1, S2 normal. No murmurs, rubs, or gallops. Cap refill <2 seconds. Pulses intact distally.  ABDOMEN: Soft, nondistended, nontender, . No rebound, guarding, rigidity. Normoactive bowel sounds present in all four quadrants. No organomegaly or mass. EXTREMITIES: No pedal edema, cyanosis, or clubbing. NEUROLOGIC: Cranial nerves II through XII are grossly intact  with no focal sensorimotor deficit. Muscle strength 5/5 in all extremities. Sensation intact. Gait not checked. PSYCHIATRIC: The patient is alert and oriented x 3. Normal affect, mood, thought content. SKIN: Warm, dry, and intact without obvious rash, lesion, or ulcer.   Labs on Admission:  CBC:  Recent Labs Lab 08/17/16 2012  WBC 10.8  HGB 12.2  HCT 35.9  MCV 85.8  PLT 251   Basic Metabolic Panel:  Recent Labs Lab 08/17/16 2012  NA 138  K 3.0*  CL 101  CO2 27  GLUCOSE 126*  BUN 15  CREATININE 1.37*  CALCIUM 8.8*   GFR: Estimated Creatinine Clearance: 44.6 mL/min (by C-G formula based on SCr of 1.37 mg/dL (H)). Liver Function  Tests:  Recent Labs Lab 08/17/16 2012  AST 38  ALT 25  ALKPHOS 41  BILITOT <0.1*  PROT 7.0  ALBUMIN 3.4*    Recent Labs Lab 08/17/16 2012  LIPASE 15  Cardiac Enzymes:  Recent Labs Lab 08/17/16 2012  TROPONINI <0.03  Urine analysis:    Component Value Date/Time   COLORURINE Colorless 06/16/2014 2108   APPEARANCEUR Clear 06/16/2014 2108   LABSPEC 1.003 06/16/2014 2108   PHURINE 5.0 06/16/2014 2108   GLUCOSEU Negative 06/16/2014 2108   HGBUR Negative 06/16/2014 2108   BILIRUBINUR Negative 06/16/2014 2108   KETONESUR Negative 06/16/2014 2108   PROTEINUR Negative 06/16/2014 2108   NITRITE Negative 06/16/2014 2108   LEUKOCYTESUR Negative 06/16/2014 2108    Radiological Exams on Admission: Dg Chest 2 View  Result Date: 08/17/2016 CLINICAL DATA:  Acute onset dyspnea tonight EXAM: CHEST  2 VIEW COMPARISON:  06/24/2014 FINDINGS: Emphysematous changes in the upper lobes, and fibrotic appearing interstitial coarsening in the central and basilar regions. No confluent airspace consolidation. No effusions. Hilar, mediastinal and cardiac contours are unremarkable and unchanged. IMPRESSION: Severe chronic emphysematous and fibrotic changes. No superimposed consolidation or effusion. Electronically Signed   By: Ellery Plunkaniel R Mitchell M.D.   On: 08/17/2016 21:15   Ct Angio Chest Pe W Or Wo Contrast  Result Date: 08/17/2016 CLINICAL DATA:  Initial evaluation for increase shortness of breath, chest pain. History of COPD. EXAM: CT ANGIOGRAPHY CHEST WITH CONTRAST TECHNIQUE: Multidetector CT imaging of the chest was performed using the standard protocol during bolus administration of intravenous contrast. Multiplanar CT image reconstructions and MIPs were obtained to evaluate the vascular anatomy. CONTRAST:  60 cc of Isovue 370. COMPARISON:  Prior radiograph from earlier the same day. FINDINGS: Cardiovascular: Intrathoracic aorta of normal caliber without acute abnormality. Scattered atheromatous  plaque within the aortic arch and descending intrathoracic aorta. Visualized great vessels within normal limits. Heart size within normal limits. Coronary artery calcifications noted within the LAD. No pericardial effusion. Evaluation of the pulmonary arterial tree somewhat limited by respiratory motion artifact. Main pulmonary artery measures within normal limits for size at 2.8 cm in diameter. No large or central filling defects are identified on this exam. Evaluation for possible distal small segmental and subsegmental emboli limited on this motion degraded study. Specifically, scattered heterogeneity within the segmental branches supplying the left lower lobe felt to be artifactual on this motion degraded study. Mediastinum/Nodes: Thyroid within normal limits. Mildly enlarged precarinal node measures 11 the mm (series 5, image 30). No other pathologically enlarged mediastinal, hilar, or axillary lymph nodes identified. Esophagus within normal limits. Lungs/Pleura: Extensive emphysematous changes present throughout the lungs. Irregular linear opacity with architectural distortion within the right perihilar region favored to reflect chronic scarring and/or atelectasis. Mild atelectatic changes at the left  lung base is well. No other consolidative airspace disease. No pulmonary edema or pleural effusion. No pneumothorax. 8 mm subpleural nodule at the left lower lobe is stable from prior CT from 06/13/2014, and likely benign (series 7, image 57). No other worrisome pulmonary nodule or mass identified. There is a. 7 mm nodular density with associated scarring/atelectasis within the right lower lobe (series 5, image 49). Upper Abdomen: Visualized upper abdomen within normal limits. Musculoskeletal: No acute osseous abnormality. No worrisome lytic or blastic osseous lesions. Large disc osteophyte complex within the mid thoracic spine noted, stable from previous. Review of the MIP images confirms the above findings.  IMPRESSION: 1. No large or central pulmonary embolism identified. Please note that evaluation for possible distal small subsegmental emboli limited on this exam due to respiratory motion artifact. 2. No other acute cardiopulmonary abnormality identified. 3. Severe emphysema with right perihilar and bibasilar atelectasis/scarring. 4. 11 mm precarinal lymph node, stable from 2015. No other adenopathy within the chest. 5. 8 mm subpleural left lower lobe nodule, stable from 2015. This is likely benign given its stability. 6. **An incidental finding of potential clinical significance has been found. 7 mm nodular density with associated atelectasis/ scarring within the right lower lobe as above. Non-contrast chest CT at 6-12 months is recommended. If the nodule is stable at time of repeat CT, then future CT at 18-24 months (from today's scan) is considered optional for low-risk patients, but is recommended for high-risk patients. This recommendation follows the consensus statement: Guidelines for Management of Incidental Pulmonary Nodules Detected on CT Images: From the Fleischner Society 2017; Radiology 2017; 284:228-243.** Electronically Signed   By: Rise Mu M.D.   On: 08/17/2016 23:03   EKG: sinus tachycardia at 109 bpm with rightward axis and nonspecific ST and T wave  changes.   Assessment/Plan Active Problems:   COPD exacerbation (HCC)    This is a 65 y.o. female with a history of home O2 dependent COPD, asthma, hypertension, hyperlipidemia  now being admitted with: 1. Acute exacerbation of COPD - IV steroids and azithromycin -Continue Spiriva and Breo Ellipta - Nebulizers, O2 therapy and expectorants as needed.  - Continuous pulse oximetry - Consider pulmonary consult if not improving.   2. hypokalemia-replace by mouth  3.  chest pain, likely secondary to COPD exacerbation and cough -We'll trend troponins -Telemetry  4. History of hypertension-continue Cardizem  5. History of  hyperlipidemia-continue Lipitor 6. History of lower extremity edema-continue Lasix 7. History of GERD-Prilosec 7. History of urinary incontinence-continued to [place] 8. History of depression-continue Paxil 9. Incidental right lower lobe lung nodule - repeat CT in 6-12 months in recommended.   IV Fluids: NS Diet/Nutrition: Heart Healtht DVT Px: Lovenox, SCDs and early ambulation Code Status: Full Code  Disposition Plan: To home in 1-2 days  All the records are reviewed and case discussed with ED provider. Management plans discussed with the patient and/or family who express understanding and agree with plan of care.  Unique Searfoss D.O. on 08/18/2016 at 1:47 AM Between 7am to 6pm - Pager - [phone] After 6pm go to [email or web address] - Social research officer, government [facility] [place] [facility] Office [phone] CC: Primary care physician; Oswaldo Conroy, MD   08/18/2016, 1:47 AM

## 2016-08-18 NOTE — Progress Notes (Signed)
Inpatient Diabetes Program Recommendations  AACE/ADA: New Consensus Statement on Inpatient Glycemic Control (2015)  Target Ranges:  Prepandial:   less than 140 mg/dL      Peak postprandial:   less than 180 mg/dL (1-2 hours)      Critically ill patients:  140 - 180 mg/dL   Lab Results  Component Value Date   GLUCAP 193 (H) 08/18/2016   HGBA1C 7.5 (H) 06/16/2014    Review of Glycemic Control  Results for Audrey Sexton, Persis G (MRN [id number]) as of 08/18/2016 11:58  Ref. Range 08/18/2016 07:41 08/18/2016 11:06  Glucose-Capillary Latest Ref Range: 65 - 99 mg/dL 409240 (H) 811193 (H)    Diabetes history: none documented but A1C 7.5% on 06/16/2014- mother and brother have diabetes Outpatient Diabetes medications: none Current orders for Inpatient glycemic control: none  Inpatient Diabetes Program Recommendations:    Per ADA recommendations "consider performing an A1C on all patients with diabetes or hyperglycemia admitted to the hospital if not performed in the prior 3 months".  Consider adding Novolog moderate correction insulin tid and Novolog 0-5 units qhs.  Ploease change diet to heart healthy/carb modified  Susette RacerJulie Boomer Winders, RN, [place], [place]HA, CDE Diabetes Coordinator Inpatient Diabetes Program  [phone] (Team Pager) [phone] [facility] Office) 08/18/2016 12:00 PM

## 2016-08-18 NOTE — Progress Notes (Signed)
Received report from Abigail,RN. Pt at this time is sitting up in bed and attempting to eat, stated that she is trying not to vomit and eating small amounts at a time.

## 2016-08-19 LAB — GLUCOSE, CAPILLARY
GLUCOSE-CAPILLARY: 204 mg/dL — AB (ref 65–99)
GLUCOSE-CAPILLARY: 191 mg/dL — AB (ref 65–99)
GLUCOSE-CAPILLARY: 132 mg/dL — AB (ref 65–99)
Glucose-Capillary: 205 mg/dL — ABNORMAL HIGH (ref 65–99)

## 2016-08-19 LAB — BASIC METABOLIC PANEL
ANION GAP: 7 (ref 5–15)
BUN: 19 mg/dL (ref 6–20)
CHLORIDE: 105 mmol/L (ref 101–111)
CO2: 30 mmol/L (ref 22–32)
Calcium: 8.8 mg/dL — ABNORMAL LOW (ref 8.9–10.3)
Creatinine, Ser: 1.25 mg/dL — ABNORMAL HIGH (ref 0.44–1.00)
GFR, EST AFRICAN AMERICAN: 50 mL/min — AB (ref >60)
GFR, EST NON AFRICAN AMERICAN: 43 mL/min — AB (ref >60)
Glucose, Bld: 222 mg/dL — ABNORMAL HIGH (ref 65–99)
POTASSIUM: 4.1 mmol/L (ref 3.5–5.1)
SODIUM: 142 mmol/L (ref 135–145)

## 2016-08-19 LAB — MAGNESIUM: MAGNESIUM: 1.9 mg/dL (ref 1.7–2.4)

## 2016-08-19 MED ORDER — DILTIAZEM HCL ER COATED BEADS 180 MG PO CP24
300.0000 mg | ORAL_CAPSULE | Freq: Every day | ORAL | Status: DC
  Administered 2016-08-19 – 2016-08-24 (×6): 300 mg via ORAL
  Filled 2016-08-19 (×6): qty 1

## 2016-08-19 MED ORDER — HYDRALAZINE HCL 25 MG PO TABS
25.0000 mg | ORAL_TABLET | Freq: Three times a day (TID) | ORAL | Status: DC
  Administered 2016-08-19 – 2016-08-24 (×16): 25 mg via ORAL
  Filled 2016-08-19 (×16): qty 1

## 2016-08-19 MED ORDER — INSULIN ASPART 100 UNIT/ML ~~LOC~~ SOLN
5.0000 [IU] | Freq: Three times a day (TID) | SUBCUTANEOUS | Status: DC
  Administered 2016-08-19 – 2016-08-24 (×16): 5 [IU] via SUBCUTANEOUS
  Filled 2016-08-19 (×16): qty 5

## 2016-08-19 MED ORDER — BENZONATATE 100 MG PO CAPS
200.0000 mg | ORAL_CAPSULE | Freq: Three times a day (TID) | ORAL | Status: DC | PRN
  Administered 2016-08-19 – 2016-08-21 (×3): 200 mg via ORAL
  Filled 2016-08-19 (×3): qty 2

## 2016-08-19 MED ORDER — INSULIN ASPART 100 UNIT/ML ~~LOC~~ SOLN
0.0000 [IU] | Freq: Every day | SUBCUTANEOUS | Status: DC
  Administered 2016-08-21: 3 [IU] via SUBCUTANEOUS
  Filled 2016-08-19: qty 3

## 2016-08-19 MED ORDER — INSULIN ASPART 100 UNIT/ML ~~LOC~~ SOLN
0.0000 [IU] | Freq: Three times a day (TID) | SUBCUTANEOUS | Status: DC
Start: 2016-08-19 — End: 2016-08-24
  Administered 2016-08-19: 3 [IU] via SUBCUTANEOUS
  Administered 2016-08-19: 5 [IU] via SUBCUTANEOUS
  Administered 2016-08-20: 3 [IU] via SUBCUTANEOUS
  Administered 2016-08-20: 8 [IU] via SUBCUTANEOUS
  Administered 2016-08-20 – 2016-08-21 (×3): 5 [IU] via SUBCUTANEOUS
  Administered 2016-08-21: 3 [IU] via SUBCUTANEOUS
  Administered 2016-08-22 (×2): 8 [IU] via SUBCUTANEOUS
  Administered 2016-08-22: 2 [IU] via SUBCUTANEOUS
  Administered 2016-08-23: 3 [IU] via SUBCUTANEOUS
  Administered 2016-08-23: 11 [IU] via SUBCUTANEOUS
  Administered 2016-08-23: 5 [IU] via SUBCUTANEOUS
  Administered 2016-08-24 (×2): 8 [IU] via SUBCUTANEOUS
  Filled 2016-08-19: qty 8
  Filled 2016-08-19: qty 5
  Filled 2016-08-19: qty 8
  Filled 2016-08-19 (×2): qty 3
  Filled 2016-08-19: qty 11
  Filled 2016-08-19: qty 2
  Filled 2016-08-19: qty 3
  Filled 2016-08-19: qty 8
  Filled 2016-08-19 (×3): qty 5
  Filled 2016-08-19 (×2): qty 8
  Filled 2016-08-19: qty 3

## 2016-08-19 MED ORDER — HYDROCOD POLST-CPM POLST ER 10-8 MG/5ML PO SUER
5.0000 mL | Freq: Two times a day (BID) | ORAL | Status: DC | PRN
  Administered 2016-08-19 (×2): 5 mL via ORAL
  Filled 2016-08-19 (×2): qty 5

## 2016-08-19 NOTE — Plan of Care (Signed)
Problem: Pain Managment: Goal: General experience of comfort will improve Outcome: Progressing Pain controlled with po pain meds, pt understands when to call for pain  Problem: Activity: Goal: Risk for activity intolerance will decrease Outcome: Progressing Minimal progression, pt cont to have moderate to severe activity intolerance

## 2016-08-19 NOTE — Progress Notes (Addendum)
[facility] [place] [facility]   PATIENT NAME: Audrey Sexton    MR#:  [id number]  DATE OF BIRTH:  1951-04-03  SUBJECTIVE:  CHIEF COMPLAINT:   Chief Complaint  Patient presents with  . Shortness of Breath  . Chest Pain  still SOB and coughing. BP and HR up REVIEW OF SYSTEMS:  Review of Systems  Constitutional: Negative for chills, fever and weight loss.  HENT: Negative for nosebleeds and sore throat.   Eyes: Negative for blurred vision.  Respiratory: Positive for cough, shortness of breath and wheezing.   Cardiovascular: Negative for chest pain, orthopnea, leg swelling and PND.  Gastrointestinal: Negative for abdominal pain, constipation, diarrhea, heartburn, nausea and vomiting.  Genitourinary: Negative for dysuria and urgency.  Musculoskeletal: Negative for back pain.  Skin: Negative for rash.  Neurological: Negative for dizziness, speech change, focal weakness and headaches.  Endo/Heme/Allergies: Does not bruise/bleed easily.  Psychiatric/Behavioral: Negative for depression.    DRUG ALLERGIES:   Allergies  Allergen Reactions  . Percocet [Oxycodone-Acetaminophen] Nausea And Vomiting  . Vicodin [Hydrocodone-Acetaminophen] Nausea And Vomiting  . Penicillins Hives    Has patient had a PCN reaction causing immediate rash, facial/tongue/throat swelling, SOB or lightheadedness with hypotension: no Has patient had a PCN reaction causing severe rash involving mucus membranes or skin necrosis: no Has patient had a PCN reaction that required hospitalization  no Has patient had a PCN reaction occurring within the last 10 years: no If all of the above answers are "NO", then may proceed with Cephalosporin use.    VITALS:  Blood pressure (!) 180/89, pulse (!) 104, temperature 97.5 F (36.4 C), temperature source Oral, resp. rate 20, height 5\' 4"  (1.626 m), weight 95.3 kg (210 lb), SpO2 97 %. PHYSICAL EXAMINATION:  Physical Exam  Constitutional: She is  oriented to person, place, and time and well-developed, well-nourished, and in no distress.  HENT:  Head: Normocephalic and atraumatic.  Eyes: Conjunctivae and EOM are normal. Pupils are equal, round, and reactive to light.  Neck: Normal range of motion. Neck supple. No tracheal deviation present. No thyromegaly present.  Cardiovascular: Normal rate, regular rhythm and normal heart sounds.   Pulmonary/Chest: Tachypnea noted. She is in respiratory distress. She has wheezes. She has rhonchi. She exhibits no tenderness.  Abdominal: Soft. Bowel sounds are normal. She exhibits no distension. There is no tenderness.  Musculoskeletal: Normal range of motion.  Neurological: She is alert and oriented to person, place, and time. No cranial nerve deficit.  Skin: Skin is warm and dry. No rash noted.  Psychiatric: Mood and affect normal.   LABORATORY PANEL:   CBC  Recent Labs Lab 08/18/16 0351  WBC 9.3  HGB 12.3  HCT 36.7  PLT 245   ------------------------------------------------------------------------------------------------------------------ Chemistries   Recent Labs Lab 08/17/16 2012  08/19/16 0433  NA 138  < > 142  K 3.0*  < > 4.1  CL 101  < > 105  CO2 27  < > 30  GLUCOSE 126*  < > 222*  BUN 15  < > 19  CREATININE 1.37*  < > 1.25*  CALCIUM 8.8*  < > 8.8*  MG  --   < > 1.9  AST 38  --   --   ALT 25  --   --   ALKPHOS 41  --   --   BILITOT <0.1*  --   --   < > = values in this interval not displayed. RADIOLOGY:  No results found.  ASSESSMENT AND PLAN:  [name], asthma, hypertension, hyperlipidemianow being admitted with:  1. Acute exacerbation of COPD - Still reporting shortness of breath continue IV steroids and azithromycin -Continue Spiriva and Breo Ellipta - Nebulizers scheduled and albuterol as needed O2 therapy and expectorants as needed.  - Consider pulmonary rehab as an outpt and pulmo Dr Welton FlakesKhan as an  outpt  2. hypokalemia-replaced  3. chest pain, likely secondary to COPD exacerbation and cough -Ruled out acute MI troponins 3 are negative  - discontinue Telemetry  4. Uncontrolled hypertension - Increase dose of Cardizem and add hydralazine.  5. History of hyperlipidemia-continue Lipitor  6. History of lower extremity edema-continue Lasix, stop IVFs  7. DM: add moderate dose SSI, novolog 5 units TID and check Hba1C    DC TELE   All the records are reviewed and case discussed with Care Management/Social Worker. Management plans discussed with the patient, family and they are in agreement.  CODE STATUS: full code  TOTAL TIME TAKING CARE OF THIS PATIENT: 35 minutes.   More than 50% of the time was spent in counseling/coordination of care: YES  POSSIBLE D/C IN 1-2 DAYS, DEPENDING ON CLINICAL CONDITION.   Delfino LovettVipul Anila Bojarski M.D on 08/19/2016 at 8:30 AM  Between 7am to 6pm - Pager - [phone]  After 6pm go to [email or web address] - Social research officer, governmentpassword EPAS [facility]  [facility]  Office  [phone]  CC: Primary care physician; Oswaldo ConroyBender, Abby Daneele, MD  Note: This dictation was prepared with Dragon dictation along with smaller phrase technology. Any transcriptional errors that result from this process are unintentional.

## 2016-08-20 LAB — GLUCOSE, CAPILLARY
GLUCOSE-CAPILLARY: 218 mg/dL — AB (ref 65–99)
GLUCOSE-CAPILLARY: 177 mg/dL — AB (ref 65–99)
Glucose-Capillary: 185 mg/dL — ABNORMAL HIGH (ref 65–99)

## 2016-08-20 LAB — HEMOGLOBIN A1C
HEMOGLOBIN A1C: 5.8 % — AB (ref 4.8–5.6)
Mean Plasma Glucose: 120 mg/dL

## 2016-08-20 LAB — PROCALCITONIN

## 2016-08-20 MED ORDER — HYDROCOD POLST-CPM POLST ER 10-8 MG/5ML PO SUER
5.0000 mL | Freq: Three times a day (TID) | ORAL | Status: DC | PRN
  Administered 2016-08-21 – 2016-08-23 (×2): 5 mL via ORAL
  Filled 2016-08-20 (×2): qty 5

## 2016-08-20 MED ORDER — MORPHINE SULFATE (PF) 2 MG/ML IV SOLN
INTRAVENOUS | Status: AC
  Filled 2016-08-20: qty 1

## 2016-08-20 MED ORDER — MORPHINE SULFATE (PF) 2 MG/ML IV SOLN
1.0000 mg | Freq: Once | INTRAVENOUS | Status: AC
  Administered 2016-08-20: 1 mg via INTRAVENOUS

## 2016-08-20 MED ORDER — AZITHROMYCIN 500 MG PO TABS
500.0000 mg | ORAL_TABLET | Freq: Every day | ORAL | Status: DC
  Administered 2016-08-21 – 2016-08-23 (×3): 500 mg via ORAL
  Filled 2016-08-20 (×3): qty 1

## 2016-08-20 MED ORDER — METHYLPREDNISOLONE SODIUM SUCC 125 MG IJ SOLR
60.0000 mg | Freq: Two times a day (BID) | INTRAMUSCULAR | Status: DC
Start: 2016-08-20 — End: 2016-08-24
  Administered 2016-08-20 – 2016-08-24 (×9): 60 mg via INTRAVENOUS
  Filled 2016-08-20 (×9): qty 2

## 2016-08-20 NOTE — Progress Notes (Signed)
[employer] - [place] at [facility]   PATIENT NAME: Audrey Sexton    MR#:  [id number]  DATE OF BIRTH:  05/02/1951  SUBJECTIVE:  CHIEF COMPLAINT:   Chief Complaint  Patient presents with  . Shortness of Breath  . Chest Pain  Somewhat better, although feels she could get one more night sleep and rest.  May make her feel better REVIEW OF SYSTEMS:  Review of Systems  Constitutional: Negative for chills, fever and weight loss.  HENT: Negative for nosebleeds and sore throat.   Eyes: Negative for blurred vision.  Respiratory: Positive for cough, shortness of breath and wheezing.   Cardiovascular: Negative for chest pain, orthopnea, leg swelling and PND.  Gastrointestinal: Negative for abdominal pain, constipation, diarrhea, heartburn, nausea and vomiting.  Genitourinary: Negative for dysuria and urgency.  Musculoskeletal: Negative for back pain.  Skin: Negative for rash.  Neurological: Negative for dizziness, speech change, focal weakness and headaches.  Endo/Heme/Allergies: Does not bruise/bleed easily.  Psychiatric/Behavioral: Negative for depression.    DRUG ALLERGIES:   Allergies  Allergen Reactions  . Percocet [Oxycodone-Acetaminophen] Nausea And Vomiting  . Vicodin [Hydrocodone-Acetaminophen] Nausea And Vomiting  . Penicillins Hives    Has patient had a PCN reaction causing immediate rash, facial/tongue/throat swelling, SOB or lightheadedness with hypotension: no Has patient had a PCN reaction causing severe rash involving mucus membranes or skin necrosis: no Has patient had a PCN reaction that required hospitalization  no Has patient had a PCN reaction occurring within the last 10 years: no If all of the above answers are "NO", then may proceed with Cephalosporin use.    VITALS:  Blood pressure 124/69, pulse 89, temperature 97.9 F (36.6 C), temperature source Oral, resp. rate 18, height 5\' 4"  (1.626 m), weight 95.3 kg (210 lb), SpO2 93 %. PHYSICAL  EXAMINATION:  Physical Exam  Constitutional: She is oriented to person, place, and time and well-developed, well-nourished, and in no distress.  HENT:  Head: Normocephalic and atraumatic.  Eyes: Conjunctivae and EOM are normal. Pupils are equal, round, and reactive to light.  Neck: Normal range of motion. Neck supple. No tracheal deviation present. No thyromegaly present.  Cardiovascular: Normal rate, regular rhythm and normal heart sounds.   Pulmonary/Chest: Tachypnea noted. She has wheezes. She has rhonchi. She exhibits no tenderness.  Abdominal: Soft. Bowel sounds are normal. She exhibits no distension. There is no tenderness.  Musculoskeletal: Normal range of motion.  Neurological: She is alert and oriented to person, place, and time. No cranial nerve deficit.  Skin: Skin is warm and dry. No rash noted.  Psychiatric: Mood and affect normal.   LABORATORY PANEL:   CBC  Recent Labs Lab 08/18/16 0351  WBC 9.3  HGB 12.3  HCT 36.7  PLT 245   ------------------------------------------------------------------------------------------------------------------ Chemistries   Recent Labs Lab 08/17/16 2012  08/19/16 0433  NA 138  < > 142  K 3.0*  < > 4.1  CL 101  < > 105  CO2 27  < > 30  GLUCOSE 126*  < > 222*  BUN 15  < > 19  CREATININE 1.37*  < > 1.25*  CALCIUM 8.8*  < > 8.8*  MG  --   < > 1.9  AST 38  --   --   ALT 25  --   --   ALKPHOS 41  --   --   BILITOT <0.1*  --   --   < > = values in this interval not displayed. RADIOLOGY:  No results found. ASSESSMENT AND PLAN:  [name], asthma, hypertension, hyperlipidemianow being admitted with:  1. Acute exacerbation of COPD - continue IV steroids and azithromycin -Continue Spiriva and Breo Ellipta - Nebulizers scheduled and albuterol as needed O2 therapy and expectorants as needed.  - Consider pulmonary rehab as an outpt and pulmo Dr Welton FlakesKhan as an outpt  2.  hypokalemia-replaced  3. chest pain, likely secondary to COPD exacerbation and cough -Ruled out acute MI troponins 3 are negative  - discontinue Telemetry  4. Uncontrolled hypertension - Increase dose of Cardizem and add hydralazine.  5. History of hyperlipidemia-continue Lipitor  6. History of lower extremity edema-continue Lasix, stop IVFs  7. DM: continue moderate dose SSI, novolog 5 units TID and Hba1C 5.8      All the records are reviewed and case discussed with Care Management/Social Worker. Management plans discussed with the patient, family and they are in agreement.  CODE STATUS: full code  TOTAL TIME TAKING CARE OF THIS PATIENT: 35 minutes.   More than 50% of the time was spent in counseling/coordination of care: YES  POSSIBLE D/C IN 1 DAYS, DEPENDING ON CLINICAL CONDITION.   Delfino LovettVipul Torez Beauregard M.D on 08/20/2016 at 9:59 AM  Between 7am to 6pm - Pager - [phone]  After 6pm go to [email or web address] - Social research officer, governmentpassword EPAS [facility]  [facility] [place] [facility]  Office  [phone]  CC: Primary care physician; Oswaldo ConroyBender, Audrey Daneele, MD  Note: This dictation was prepared with Dragon dictation along with smaller phrase technology. Any transcriptional errors that result from this process are unintentional.

## 2016-08-20 NOTE — Progress Notes (Signed)
PHARMACIST - PHYSICIAN COMMUNICATION  CONCERNING: Antibiotic IV to Oral Route Change Policy  RECOMMENDATION: This patient is receiving AZITHROMYCIN by the intravenous route.  Based on criteria approved by the Pharmacy and Therapeutics Committee, the antibiotic(s) is/are being converted to the equivalent oral dose form(s).   DESCRIPTION: These criteria include:  Patient being treated for a respiratory tract infection, urinary tract infection, cellulitis or clostridium difficile associated diarrhea if on metronidazole  The patient is not neutropenic and does not exhibit a GI malabsorption state  The patient is eating (either orally or via tube) and/or has been taking other orally administered medications for a least 24 hours  The patient is improving clinically and has a Tmax < 100.5  If you have questions about this conversion, please contact the Pharmacy Department  []  ( [phone] )   [x]  ( [phone] )  [facility] []  ( [phone] )  [place] []  ( [phone] )  [facility] []  ( [phone] )  [facility]    Sharief Wainwright PharmD Clinical Pharmacist 08/20/2016  

## 2016-08-20 NOTE — Progress Notes (Signed)
Patient complaining of sharp chest pain during breathing treatment causing more shortness of breath. Patient denies dizziness, fatigue, or nausea. The pain has subsided but a dull pain still lingers. Vital signs stable. MD notified. And ordered 1mg  of IV Morphine.   Harvie HeckMelanie Lumir Demetriou, RN

## 2016-08-21 LAB — GLUCOSE, CAPILLARY
GLUCOSE-CAPILLARY: 225 mg/dL — AB (ref 65–99)
GLUCOSE-CAPILLARY: 212 mg/dL — AB (ref 65–99)
Glucose-Capillary: 287 mg/dL — ABNORMAL HIGH (ref 65–99)

## 2016-08-21 MED ORDER — PREDNISONE 10 MG (21) PO TBPK: 10.0000 mg | ORAL_TABLET | Freq: Every day | ORAL | 0 refills | Status: DC

## 2016-08-21 MED ORDER — BENZONATATE 200 MG PO CAPS: 200.0000 mg | ORAL_CAPSULE | Freq: Three times a day (TID) | ORAL | 0 refills | Status: DC | PRN

## 2016-08-21 NOTE — Progress Notes (Signed)
Pulmonary consult was orders paged on-call pulmonary MD five times with no results. Talked with IC then also have a consult and had no return call from MD. Verified with operator that number in Amion was correct.

## 2016-08-21 NOTE — Progress Notes (Signed)
[employer] - [place] at [facility]   PATIENT NAME: Audrey Hammockrlene Sexton    MR#:  [id number]  DATE OF BIRTH:  1951-03-16  SUBJECTIVE:  CHIEF COMPLAINT:   Chief Complaint  Patient presents with  . Shortness of Breath  . Chest Pain  still SOB and coughing. BP and HR up REVIEW OF SYSTEMS:  Review of Systems  Constitutional: Negative for chills, fever and weight loss.  HENT: Negative for nosebleeds and sore throat.   Eyes: Negative for blurred vision.  Respiratory: Positive for cough, shortness of breath and wheezing.   Cardiovascular: Negative for chest pain, orthopnea, leg swelling and PND.  Gastrointestinal: Negative for abdominal pain, constipation, diarrhea, heartburn, nausea and vomiting.  Genitourinary: Negative for dysuria and urgency.  Musculoskeletal: Negative for back pain.  Skin: Negative for rash.  Neurological: Negative for dizziness, speech change, focal weakness and headaches.  Endo/Heme/Allergies: Does not bruise/bleed easily.  Psychiatric/Behavioral: Negative for depression.    DRUG ALLERGIES:   Allergies  Allergen Reactions  . Percocet [Oxycodone-Acetaminophen] Nausea And Vomiting  . Vicodin [Hydrocodone-Acetaminophen] Nausea And Vomiting  . Penicillins Hives    Has patient had a PCN reaction causing immediate rash, facial/tongue/throat swelling, SOB or lightheadedness with hypotension: no Has patient had a PCN reaction causing severe rash involving mucus membranes or skin necrosis: no Has patient had a PCN reaction that required hospitalization  no Has patient had a PCN reaction occurring within the last 10 years: no If all of the above answers are "NO", then may proceed with Cephalosporin use.    VITALS:  Blood pressure (!) 153/79, pulse (!) 115, temperature 98 F (36.7 C), temperature source Oral, resp. rate (!) 22, height 5\' 4"  (1.626 m), weight 95.3 kg (210 lb), SpO2 94 %. PHYSICAL EXAMINATION:  Physical Exam  Constitutional: She is  oriented to person, place, and time and well-developed, well-nourished, and in no distress.  HENT:  Head: Normocephalic and atraumatic.  Eyes: Conjunctivae and EOM are normal. Pupils are equal, round, and reactive to light.  Neck: Normal range of motion. Neck supple. No tracheal deviation present. No thyromegaly present.  Cardiovascular: Normal rate, regular rhythm and normal heart sounds.   Pulmonary/Chest: Tachypnea noted. She is in respiratory distress. She has wheezes. She has rhonchi. She exhibits no tenderness.  Abdominal: Soft. Bowel sounds are normal. She exhibits no distension. There is no tenderness.  Musculoskeletal: Normal range of motion.  Neurological: She is alert and oriented to person, place, and time. No cranial nerve deficit.  Skin: Skin is warm and dry. No rash noted.  Psychiatric: Mood and affect normal.   LABORATORY PANEL:   CBC  Recent Labs Lab 08/18/16 0351  WBC 9.3  HGB 12.3  HCT 36.7  PLT 245   ------------------------------------------------------------------------------------------------------------------ Chemistries   Recent Labs Lab 08/17/16 2012  08/19/16 0433  NA 138  < > 142  K 3.0*  < > 4.1  CL 101  < > 105  CO2 27  < > 30  GLUCOSE 126*  < > 222*  BUN 15  < > 19  CREATININE 1.37*  < > 1.25*  CALCIUM 8.8*  < > 8.8*  MG  --   < > 1.9  AST 38  --   --   ALT 25  --   --   ALKPHOS 41  --   --   BILITOT <0.1*  --   --   < > = values in this interval not displayed. RADIOLOGY:  No results  found. ASSESSMENT AND PLAN:  [name], asthma, hypertension, hyperlipidemianow being admitted with:  1. Acute exacerbation of COPD - Still reporting shortness of breath continue IV steroids and azithromycin -Continue Spiriva and Breo Ellipta - Nebulizers scheduled and albuterol as needed O2 therapy and expectorants as needed.  - Consider pulmonary rehab as an outpt and Follows with pulmo Dr  Welton FlakesKhan as an outpt  - We will consult pulmonary as she is still having continued difficulty with breathing  2. hypokalemia-replaced  3. chest pain, likely secondary to COPD exacerbation and cough -Ruled out acute MI troponins 3 are negative  - discontinue Telemetry  4. Uncontrolled hypertension - Increase dose of Cardizem and add hydralazine.  5. History of hyperlipidemia-continue Lipitor  6. History of lower extremity edema-continue Lasix 40 mg once daily  7. DM: continue moderate dose SSI, novolog 5 units TID and Hba1C 5.8       All the records are reviewed and case discussed with Care Management/Social Worker. Management plans discussed with the patient, family and they are in agreement.  CODE STATUS: full code  TOTAL TIME TAKING CARE OF THIS PATIENT: 35 minutes.   More than 50% of the time was spent in counseling/coordination of care: YES  POSSIBLE D/C IN 1-2 DAYS, DEPENDING ON CLINICAL CONDITION.   Delfino LovettVipul Nanea Jared M.D on 08/21/2016 at 9:26 AM  Between 7am to 6pm - Pager - [phone]  After 6pm go to [email or web address] - Social research officer, governmentpassword EPAS [facility]  [facility] [place] [facility]  Office  [phone]  CC: Primary care physician; Oswaldo ConroyBender, Abby Daneele, MD  Note: This dictation was prepared with Dragon dictation along with smaller phrase technology. Any transcriptional errors that result from this process are unintentional.

## 2016-08-21 NOTE — Discharge Instructions (Signed)
Chronic Obstructive Pulmonary Disease Chronic obstructive pulmonary disease (COPD) is a common lung problem. In COPD, the flow of air from the lungs is limited. The way your lungs work will probably never return to normal, but there are things you can do to improve your lungs and make yourself feel better. Your doctor may treat your condition with:  Medicines.  Oxygen.  Lung surgery.  Changes to your diet.  Rehabilitation. This may involve a team of specialists. Follow these instructions at home:  Take all medicines as told by your doctor.  Avoid medicines or cough syrups that dry up your airway (such as antihistamines) and do not allow you to get rid of thick spit. You do not need to avoid them if told differently by your doctor.  If you smoke, stop. Smoking makes the problem worse.  Avoid being around things that make your breathing worse (like smoke, chemicals, and fumes).  Use oxygen therapy and therapy to help improve your lungs (pulmonary rehabilitation) if told by your doctor. If you need home oxygen therapy, ask your doctor if you should buy a tool to measure your oxygen level (oximeter).  Avoid people who have a sickness you can catch (contagious).  Avoid going outside when it is very hot, cold, or humid.  Eat healthy foods. Eat smaller meals more often. Rest before meals.  Stay active, but remember to also rest.  Make sure to get all the shots (vaccines) your doctor recommends. Ask your doctor if you need a pneumonia shot.  Learn and use tips on how to relax.  Learn and use tips on how to control your breathing as told by your doctor. Try: 1. Breathing in (inhaling) through your nose for 1 second. Then, pucker your lips and breath out (exhale) through your lips for 2 seconds. 2. Putting one hand on your belly (abdomen). Breathe in slowly through your nose for 1 second. Your hand on your belly should move out. Pucker your lips and breathe out slowly through your lips.  Your hand on your belly should move in as you breathe out.  Learn and use controlled coughing to clear thick spit from your lungs. The steps are: 1. Lean your head a little forward. 2. Breathe in deeply. 3. Try to hold your breath for 3 seconds. 4. Keep your mouth slightly open while coughing 2 times. 5. Spit any thick spit out into a tissue. 6. Rest and do the steps again 1 or 2 times as needed. Contact a doctor if:  You cough up more thick spit than usual.  There is a change in the color or thickness of the spit.  It is harder to breathe than usual.  Your breathing is faster than usual. Get help right away if:  You have shortness of breath while resting.  You have shortness of breath that stops you from:  Being able to talk.  Doing normal activities.  You chest hurts for longer than 5 minutes.  Your skin color is more blue than usual.  Your pulse oximeter shows that you have low oxygen for longer than 5 minutes. This information is not intended to replace advice given to you by your health care provider. Make sure you discuss any questions you have with your health care provider. Document Released: 01/31/2008 Document Revised: 01/20/2016 Document Reviewed: 04/10/2013 Elsevier Interactive Patient Education  2017 [employer]  

## 2016-08-22 DIAGNOSIS — J441 Chronic obstructive pulmonary disease with (acute) exacerbation: Secondary | ICD-10-CM

## 2016-08-22 DIAGNOSIS — J9621 Acute and chronic respiratory failure with hypoxia: Principal | ICD-10-CM

## 2016-08-22 DIAGNOSIS — J209 Acute bronchitis, unspecified: Secondary | ICD-10-CM

## 2016-08-22 DIAGNOSIS — R911 Solitary pulmonary nodule: Secondary | ICD-10-CM

## 2016-08-22 LAB — GLUCOSE, CAPILLARY
GLUCOSE-CAPILLARY: 270 mg/dL — AB (ref 65–99)
GLUCOSE-CAPILLARY: 291 mg/dL — AB (ref 65–99)
Glucose-Capillary: 288 mg/dL — ABNORMAL HIGH (ref 65–99)
Glucose-Capillary: 133 mg/dL — ABNORMAL HIGH (ref 65–99)
Glucose-Capillary: 200 mg/dL — ABNORMAL HIGH (ref 65–99)

## 2016-08-22 LAB — PROCALCITONIN

## 2016-08-22 MED ORDER — IPRATROPIUM-ALBUTEROL 0.5-2.5 (3) MG/3ML IN SOLN
3.0000 mL | RESPIRATORY_TRACT | Status: DC
  Administered 2016-08-23 – 2016-08-24 (×11): 3 mL via RESPIRATORY_TRACT
  Filled 2016-08-22 (×11): qty 3

## 2016-08-22 NOTE — Plan of Care (Signed)
Problem: Bowel/Gastric: Goal: Will not experience complications related to bowel motility Outcome: Progressing Pt's breathing has returned to baseline at rest, awaiting pulmonology consult, pt remains on baseline O2.

## 2016-08-22 NOTE — Consult Note (Signed)
Name: Audrey Sexton MRN: [id number] DOB: August 23, 1951    ADMISSION DATE:  08/17/2016 CONSULTATION DATE:  08/22/2016  REFERRING MD :  Dr. Sherryll BurgerShah  CHIEF COMPLAINT:  Shortness of Breath   BRIEF PATIENT DESCRIPTION: This is a 65 yo female who [facility] with acute on chronic hypoxic respiratory failure secondary to AECOPD   SIGNIFICANT EVENTS  12/26-[facility] due to acute on chronic hypoxic respiratory failure secondary to AECOPD  STUDIES:  CT Angio Chest 12/21>>No large or central pulmonary embolism identified. Please note that evaluation for possible distal small subsegmental emboli limited on this exam due to respiratory motion artifact. No other acute cardiopulmonary abnormality identified. Severe emphysema with right perihilar and bibasilar atelectasis/scarring. 11 mm precarinal lymph node, stable from 2015. No other adenopathy within the chest. 8 mm subpleural left lower lobe nodule, stable from 2015. This is likely benign given its stability. An incidental finding of potential clinical significance has been found. 7 mm nodular density with associated atelectasis/ scarring within the right lower lobe as above. Non-contrast chest CT at 6-12 months is recommended. If the nodule is stable at time of repeat CT, then future CT at 18-24 months (from today's scan) is considered optional for low-risk patients, but is recommended for high-risk patients  HISTORY OF PRESENT ILLNESS:   This is a 65 yo female with a PMH of HTN, Hyperlipidemia, COPD, O2 dependent 3L via nasal canula, Former Smoker, and Asthma.  [facility] with c/o shortness of breath and chest pain.  She also endorsed a couple of vomiting episodes and had mild to moderate mid sternal chest pain symptoms worse when the pt took deeps breaths.  Per ER notes the pt took her outpatient respiratory medications without relief of symptoms.  [facility]  acute on chronic hypoxic respiratory failure secondary to AECOPD.  She was scheduled to be discharged home 12/26, however due to continued desaturation on baseline O2 discharge canceled and PCCM consulted for additional management of AECOPD 12/26.   PAST MEDICAL HISTORY :   has a past medical history of Asthma; Carpal tunnel syndrome of left wrist; COPD (chronic obstructive pulmonary disease) (HCC); Hyperlipemia; and Hypertension.  has a past surgical history that includes Rotator cuff repair (Left); Carpal tunnel release (Left); fractured tibia (Left); and Tubal ligation. Prior to Admission medications   Medication Sig Start Date End Date Taking? Authorizing Provider  Acetaminophen 500 MG coapsule Take 500 mg by mouth as needed.   Yes Historical Provider, MD  albuterol (ACCUNEB) 0.63 MG/3ML nebulizer solution Inhale 3 mLs into the lungs 3 (three) times daily as needed.    Yes Historical Provider, MD  albuterol (PROVENTIL HFA;VENTOLIN HFA) 108 (90 Base) MCG/ACT inhaler Inhale 2 puffs into the lungs every 4 (four) hours as needed.   Yes Historical Provider, MD  Ascorbic Acid (VITAMIN C) 1000 MG tablet Take 1,000 mg by mouth daily.   Yes Historical Provider, MD  atorvastatin (LIPITOR) 40 MG tablet Take 40 mg by mouth daily.   Yes Historical Provider, MD  Cholecalciferol (VITAMIN D-1000 MAX ST) 1000 units tablet Take 1,000 Units by mouth daily.   Yes Historical Provider, MD  diltiazem (CARDIZEM CD) 240 MG 24 hr capsule Take 240 mg by mouth daily.   Yes Historical Provider, MD  fluticasone furoate-vilanterol (BREO ELLIPTA) 100-25 MCG/INH AEPB Inhale 1 puff into the lungs daily.   Yes Historical Provider, MD  furosemide (LASIX)  40 MG tablet Take 40 mg by mouth daily.   Yes Historical Provider, MD  Multiple Vitamin (MULTI-VITAMIN DAILY PO) Take 1 tablet by mouth daily.   Yes Historical Provider, MD  omeprazole (PRILOSEC) 20 MG capsule Take 20 mg by mouth daily.   Yes Historical Provider, MD  oxybutynin  (DITROPAN) 5 MG tablet Take 5 mg by mouth daily.   Yes Historical Provider, MD  PARoxetine (PAXIL) 20 MG tablet Take 20 mg by mouth daily.   Yes Historical Provider, MD  tiotropium (SPIRIVA) 18 MCG inhalation capsule Place 1 capsule into inhaler and inhale daily.   Yes Historical Provider, MD  vitamin B-12 (CYANOCOBALAMIN) 1000 MCG tablet Take 2,000 mcg by mouth daily.   Yes Historical Provider, MD  benzonatate (TESSALON) 200 MG capsule Take 1 capsule (200 mg total) by mouth 3 (three) times daily as needed for cough. 08/21/16   Delfino LovettVipul Shah, MD  predniSONE (STERAPRED UNI-PAK 21 TAB) 10 MG (21) TBPK tablet Take 1 tablet (10 mg total) by mouth daily. Start 60 mg once daily, taper 10 mg daily until done 08/21/16   Delfino LovettVipul Shah, MD   Allergies  Allergen Reactions  . Percocet [Oxycodone-Acetaminophen] Nausea And Vomiting  . Vicodin [Hydrocodone-Acetaminophen] Nausea And Vomiting  . Penicillins Hives    Has patient had a PCN reaction causing immediate rash, facial/tongue/throat swelling, SOB or lightheadedness with hypotension: no Has patient had a PCN reaction causing severe rash involving mucus membranes or skin necrosis: no Has patient had a PCN reaction that required hospitalization  no Has patient had a PCN reaction occurring within the last 10 years: no If all of the above answers are "NO", then may proceed with Cephalosporin use.     FAMILY HISTORY:  family history includes Alzheimer's disease in her father; Breast cancer in her sister; Diabetes in her brother and mother; Heart disease in her brother and mother; Kidney disease in her sister; Parkinson's disease in her sister. SOCIAL HISTORY:  reports that she has quit smoking. Her smoking use included Cigarettes. She has never used smokeless tobacco. She reports that she does not drink alcohol.  REVIEW OF SYSTEMS:  Positives in BOLD Constitutional: Negative for fever, chills, weight loss, malaise/fatigue and diaphoresis.  HENT: Negative for  hearing loss, ear pain, nosebleeds, congestion, sore throat, neck pain, tinnitus and ear discharge.   Eyes: Negative for blurred vision, double vision, photophobia, pain, discharge and redness.  Respiratory: cough, hemoptysis, sputum production, shortness of breath, wheezing and stridor.   Cardiovascular: mild chest pain, palpitations, orthopnea, claudication, leg swelling and PND.  Gastrointestinal: heartburn, nausea, vomiting, mild abdominal pain, diarrhea, constipation, blood in stool and melena.  Genitourinary: Negative for dysuria, urgency, frequency, hematuria and flank pain.  Musculoskeletal: Negative for myalgias, back pain, joint pain and falls.  Skin: Negative for itching and rash.  Neurological: Negative for dizziness, tingling, tremors, sensory change, speech change, focal weakness, seizures, loss of consciousness, weakness and headaches.  Endo/Heme/Allergies: Negative for environmental allergies and polydipsia. Does not bruise/bleed easily.  SUBJECTIVE:  Pt states she has worsening shortness of breath with exertion on 3L O2 when compared to her baseline.  She states her chest pain has improved significantly and has almost subsided.  She is also having constipation.  VITAL SIGNS: Temp:  [98 F (36.7 C)-98.9 F (37.2 C)] 98.4 F (36.9 C) (12/26 1954) Pulse Rate:  [103-113] 105 (12/26 1954) Resp:  [16-19] 18 (12/26 1954) BP: (131-162)/(63-88) 138/71 (12/26 1954) SpO2:  [92 %-95 %] 93 % (12/26 2056)  PHYSICAL EXAMINATION: General:  Chronically ill appearing Caucasian female, well developed, well nourished  Neuro:  Alert and oriented, follows commands, PERRLA HEENT:  Supple, no JVD Cardiovascular:  S1s2, rrr, no M/R/G Lungs:  Diminished throughout, even, non labored  Abdomen:  +BS x4, soft, obese, mildly tender, non distended Musculoskeletal:  Normal bulk and tone, trace bilateral lower extremity edema Skin:  Intact no rashes or lesions   Recent Labs Lab 08/17/16 2012  08/18/16 0351 08/19/16 0433  NA 138 140 142  K 3.0* 3.1* 4.1  CL 101 101 105  CO2 27 24 30   BUN 15 15 19   CREATININE 1.37* 1.50* 1.25*  GLUCOSE 126* 304* 222*    Recent Labs Lab 08/17/16 2012 08/18/16 0351  HGB 12.2 12.3  HCT 35.9 36.7  WBC 10.8 9.3  PLT 251 245   No results found.  ASSESSMENT / PLAN: Acute on chronic hypoxic respiratory failure secondary to AECOPD Incidental finding on CT Angio Chest 12/21-7 mm nodular density RLL Hx: Former smoker, O2 dependant 3L via nasal canula, and Asthma  P: Obtain ambulatory pulse oximetry prior to discharge to determine outpatient O2 needs  Supplemental O2 to maintain O2 sats 88% to 92% Continue aggressive bronchodilator therapy Continue Azithromycin x5 days-stop date 12/29 following 5th dose  Continue prn Tessalon for cough Continue iv steroids wean to po prednisone taper over 10 days as tolerated  Follow sputum culture Will need follow-up pulmonology appointment to establish care and obtain PFT's  Repeat CT Angio Chest in outpatient setting in 6 months to monitor 7 mm nodular density RLL Repeat CXR 12/27  Sonda Rumble, [place]  Pulmonary/Critical Care Pager [phone] (please enter 7 digits) PCCM Consult Pager [phone] (please enter 7 digits)   Pt seen and examined with NP, agree with assessment and plan. 65 yo female with history of COPD, last saw Dr. Park Breed in 2015. Is on advair, spiriva, nebs at baseline, now presented with progressive dyspnea. She has scattered wheeze on ausculation with moderate dyspnea. Continue steroids, and abx for acute bronchitis. Review of CT chest images, small right lung nodule, diffuse changes of emphysema.  Pt was advised to follow up outpatient with Dr. Park Breed for COPD and lung nodule.   Pulmonary service will sign off for now, please call with any further questions or concerns.   Wells Guiles, M.D.  08/23/2016

## 2016-08-22 NOTE — Progress Notes (Addendum)
[employer] - [place] [facility]   PATIENT NAME: Audrey Sexton    MR#:  [id number]  DATE OF BIRTH:  16-May-1951  SUBJECTIVE:  CHIEF COMPLAINT:   Chief Complaint  Patient presents with  . Shortness of Breath  . Chest Pain  was planned for D/C but didn't do well with PT - increase HR and tachypnea. Dropped O2 sats as well REVIEW OF SYSTEMS:  Review of Systems  Constitutional: Negative for chills, fever and weight loss.  HENT: Negative for nosebleeds and sore throat.   Eyes: Negative for blurred vision.  Respiratory: Positive for cough, shortness of breath and wheezing.   Cardiovascular: Negative for chest pain, orthopnea, leg swelling and PND.  Gastrointestinal: Negative for abdominal pain, constipation, diarrhea, heartburn, nausea and vomiting.  Genitourinary: Negative for dysuria and urgency.  Musculoskeletal: Negative for back pain.  Skin: Negative for rash.  Neurological: Negative for dizziness, speech change, focal weakness and headaches.  Endo/Heme/Allergies: Does not bruise/bleed easily.  Psychiatric/Behavioral: Negative for depression.    DRUG ALLERGIES:   Allergies  Allergen Reactions  . Percocet [Oxycodone-Acetaminophen] Nausea And Vomiting  . Vicodin [Hydrocodone-Acetaminophen] Nausea And Vomiting  . Penicillins Hives    Has patient had a PCN reaction causing immediate rash, facial/tongue/throat swelling, SOB or lightheadedness with hypotension: no Has patient had a PCN reaction causing severe rash involving mucus membranes or skin necrosis: no Has patient had a PCN reaction that required hospitalization  no Has patient had a PCN reaction occurring within the last 10 years: no If all of the above answers are "NO", then may proceed with Cephalosporin use.    VITALS:  Blood pressure (!) 145/63, pulse (!) 108, temperature 98.5 F (36.9 C), temperature source Oral, resp. rate 17, height 5\' 4"  (1.626 m), weight 95.3 kg (210 lb), SpO2 92  %. PHYSICAL EXAMINATION:  Physical Exam  Constitutional: She is oriented to person, place, and time and well-developed, well-nourished, and in no distress.  HENT:  Head: Normocephalic and atraumatic.  Eyes: Conjunctivae and EOM are normal. Pupils are equal, round, and reactive to light.  Neck: Normal range of motion. Neck supple. No tracheal deviation present. No thyromegaly present.  Cardiovascular: Normal rate, regular rhythm and normal heart sounds.   Pulmonary/Chest: Tachypnea noted. She is in respiratory distress. She has wheezes. She has rhonchi. She exhibits no tenderness.  Abdominal: Soft. Bowel sounds are normal. She exhibits no distension. There is no tenderness.  Musculoskeletal: Normal range of motion.  Neurological: She is alert and oriented to person, place, and time. No cranial nerve deficit.  Skin: Skin is warm and dry. No rash noted.  Psychiatric: Mood and affect normal.   LABORATORY PANEL:   CBC  Recent Labs Lab 08/18/16 0351  WBC 9.3  HGB 12.3  HCT 36.7  PLT 245   ------------------------------------------------------------------------------------------------------------------ Chemistries   Recent Labs Lab 08/17/16 2012  08/19/16 0433  NA 138  < > 142  K 3.0*  < > 4.1  CL 101  < > 105  CO2 27  < > 30  GLUCOSE 126*  < > 222*  BUN 15  < > 19  CREATININE 1.37*  < > 1.25*  CALCIUM 8.8*  < > 8.8*  MG  --   < > 1.9  AST 38  --   --   ALT 25  --   --   ALKPHOS 41  --   --   BILITOT <0.1*  --   --   < > =  values in this interval not displayed. RADIOLOGY:  No results found. ASSESSMENT AND PLAN:  [name], asthma, hypertension, hyperlipidemianow being admitted with:  1. Acute exacerbation of COPD - Still reporting shortness of breath continue IV steroids and azithromycin -Continue Spiriva and Breo Ellipta - Nebulizers scheduled and albuterol as needed O2 therapy and expectorants as needed.   - Consider pulmonary rehab as an outpt and Follows with pulmo Dr Welton FlakesKhan as an outpt  - We will consult pulmonary as she is still having continued difficulty with breathing - hold off D/C today, Likely will need o2 upon D/C  2. hypokalemia-replaced  3. chest pain, likely secondary to COPD exacerbation and cough -Ruled out acute MI troponins 3 are negative  - discontinue Telemetry  4. Uncontrolled hypertension - Increase dose of Cardizem and add hydralazine.  5. History of hyperlipidemia-continue Lipitor  6. History of lower extremity edema-continue Lasix 40 mg once daily  7. DM: continue moderate dose SSI, novolog 5 units TID and Hba1C 5.8    Palliative care c/s   All the records are reviewed and case discussed with Care Management/Social Worker. Management plans discussed with the patient, family and they are in agreement.  CODE STATUS: full code  TOTAL TIME TAKING CARE OF THIS PATIENT: 35 minutes.   More than 50% of the time was spent in counseling/coordination of care: YES  POSSIBLE D/C IN 1-2 DAYS, DEPENDING ON CLINICAL CONDITION.   Delfino LovettVipul Justus Droke M.D on 08/22/2016 at 2:24 PM  Between 7am to 6pm - Pager - [phone]  After 6pm go to [email or web address] - Social research officer, governmentpassword EPAS [facility]  [facility] [place] [facility]  Office  [phone]  CC: Primary care physician; Oswaldo ConroyBender, Abby Daneele, MD  Note: This dictation was prepared with Dragon dictation along with smaller phrase technology. Any transcriptional errors that result from this process are unintentional.

## 2016-08-22 NOTE — Care Management Note (Signed)
Case Management Note  Patient Details  Name: Audrey Sexton MRN: [id number] Date of Birth: 11/15/1950  Subjective/Objective:  Admitted with COPD exacerbation. On chronic 02 at home. Daughter to bring a portable tank for discharge. She remains short of breath, wheezing, and tachycardic with exertion. Would benefit from home health. Met with patient at bedside. Her daughter will be staying with her for a few nights since patient lives alone. PCP is Therapist, sports. Patient would like to use [facility] since she has used them in the past.                   Action/Plan: Referral to [facility] for SN and PT. Ordered a rolling walker from [facility].   Expected Discharge Date:                  Expected Discharge Plan:  [place]  In-House Referral:     Discharge planning Services  CM Consult  Post Acute Care Choice:  Home Health Choice offered to:  Patient  DME Arranged:   Vassie Moselle DME Agency:    [place] Arranged:  RN, PT [facility] Agency:  [place]  Status of Service:  In process, will continue to follow  If discussed at Long Length of Stay Meetings, dates discussed:    Additional Comments:  Jolly Mango, RN 08/22/2016, 2:35 PM

## 2016-08-22 NOTE — Progress Notes (Signed)
Family Meeting Note  [facility] Directive:yes  Today a meeting took place with the Patient.  The following clinical team members were present during this meeting:MD  The following were discussed:Patient's diagnosis: , Patient's progosis: > 12 months and Goals for treatment: Full Code  Additional follow-up to be provided: consider DNR  Time spent during discussion:20 minutes  Audrey LovettVipul Lamin Chandley, MD

## 2016-08-22 NOTE — Progress Notes (Signed)
Shift assessment completed at 0800. Pt is awake, alert and oriented, in no distress. [id number], lungs are decreased throughout but pt is able to take a deep breath and cough. Hr is regular, abdomen is soft, bs heard. Pt is wearing incontinence brief, ppp, no edema noted. PIV #22 intact to lac, #20 intact to rac, both sites are free of redness and swelling. Pt denied pain, anticipating discharge today. Dr. Sherryll BurgerShah has rounded on pt since assessment and concurs with discharge.

## 2016-08-22 NOTE — Evaluation (Signed)
Physical Therapy Evaluation Patient Details Name: Audrey Sexton MRN: [id number] DOB: May 01, 1951 Today's Date: 08/22/2016   History of Present Illness  Audrey Sexton is a 65 y.o. 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  Patient was in a usual state of health until this afternoon when she developed the sudden onset of shortness of breath associated with nonproductive cough, mild nausea with her coughing and new central chest pain that is worse with breathing. She reports that she has not taken any nebulizer therapy at home for this episode and she has not sought any other medical help prior to her arrival in the emergency department this evening. Otherwise there has been no change in status. Patient has been taking medication as prescribed and there has been no recent change in medication or diet.  There has been no recent illness, travel or sick contacts. Patient denies fevers/chills, weakness, dizziness, vomiting, constipation, diarrhea, abdominal pain, dysuria/frequency, changes in mental status. 1 reported fall in the last 12 months.   Clinical Impression  Pt admitted with above diagnosis. Pt currently with functional limitations due to the deficits listed below (see PT Problem List).  Pt demonstrates fair stability with bed mobility, transfers, and ambulation. However she reports severe DOE with very limited ambulation. SaO2 drops to 85% on 3L/min O2 and HR increases from 110 at EOB to 130 bpm. Pt has difficulty maintaining conversation and RR increases significantly. Pt requires 3 standing rest breaks during short distance ambulation due to SOB. Able to recover SaO2 to 90% or greater but with extended pursed lip breathing of 60-90 seconds with rest. Pt also presents with higher level balance deficits and frequently reaches for walls/counters during ambulation.  She would benefit from rolling walker at discharge to provide added stability and decrease fall risk. [facility] and [facility]. Once pt has returned to baseline she may benefit from pulmonary rehab per pulmonologist referral to improve baseline endurance and energy conservation. MD paged and notified of concerns regarding discharge. Pt will benefit from skilled PT services to address deficits in strength, balance, and mobility in order to return to full function at home.     Follow Up Recommendations Home health PT;Other (comment) (In the future may benefit from pulmonary rehab)    Equipment Recommendations  Rolling walker with 5" wheels    Recommendations for Other Services       Precautions / Restrictions Precautions Precautions: None Restrictions Weight Bearing Restrictions: No      Mobility  Bed Mobility Overal bed mobility: Modified Independent             General bed mobility comments: HOB mildly elevated, bed rails utilized  Transfers Overall transfer level: Needs assistance Equipment used: None Transfers: Sit to/from Stand Sit to Stand: Min guard         General transfer comment: Pt stumbles once initially after transfer placing hand down on bed to stabilize. Fair speed noted during transfer. Once stabilized she demonstrates fair safety in standing without UE support  Ambulation/Gait Ambulation/Gait assistance: Min guard Ambulation Distance (Feet): 100 Feet Assistive device: None Gait Pattern/deviations: Step-through pattern Gait velocity: Decreased Gait velocity interpretation: <1.8 ft/sec, indicative of risk for recurrent falls General Gait Details: Pt ambulates with slow and labored gait. Supplemental O2 utilized at 3L/min which is baseline for patient. SaO2 drops to 85% and HR increases  to 130 bpm during ambulation. Pt with labored breathing and increased RR. Difficulty conversing with therapist during ambulation due to  shortness of breath. Pt requires 3 extended standing rest breaks of short distance due to fatigue and SOB. She intermittently reaches out for falls/counters during ambulation. Pt reports she regularly holds onto surfaces for stability with ambulation at home  Stairs            Wheelchair Mobility    Modified Rankin (Stroke Patients Only)       Balance Overall balance assessment: Needs assistance Sitting-balance support: No upper extremity supported Sitting balance-Leahy Scale: Good     Standing balance support: No upper extremity supported Standing balance-Leahy Scale: Fair Standing balance comment: Pt able to maintain feet together/apart balance without UE support. Positive Rhomberg for increased sway but no LOB. Single leg balance approximately 1 second on each LE. pt with a few stumbles during ambulation but able to self correct without need for external assistance                             Pertinent Vitals/Pain Pain Assessment: 0-10 Pain Score: 4  Pain Location: right side of chest only with coughing Pain Descriptors / Indicators: Sharp Pain Intervention(s): Monitored during session    Home Living Family/patient expects to be discharged to:: Private residence Living Arrangements: Alone Available Help at Discharge: Family (Daughter lives nearby) Type of Home: Apartment Home Access: Level entry     Home Layout: One level Home Equipment: Shower seat      Prior Function Level of Independence: Independent         Comments: Independent with ADLs. Ambulates without assistive device full community distances. Pt requires assist with IADLs. Wears O2 at home at 3L/min      Hand Dominance   Dominant Hand: Right    Extremity/Trunk Assessment   Upper Extremity Assessment Upper Extremity Assessment: Overall WFL for tasks assessed    Lower Extremity Assessment Lower Extremity Assessment: Overall WFL for tasks assessed       Communication    Communication: No difficulties  Cognition Arousal/Alertness: Awake/alert Behavior During Therapy: WFL for tasks assessed/performed Overall Cognitive Status: Within Functional Limits for tasks assessed                      General Comments      Exercises     Assessment/Plan    PT Assessment Patient needs continued PT services  PT Problem List Decreased strength;Decreased activity tolerance;Decreased balance;Decreased mobility;Cardiopulmonary status limiting activity          PT Treatment Interventions DME instruction;Gait training;Therapeutic activities;Therapeutic exercise;Balance training;Neuromuscular re-education;Patient/family education    PT Goals (Current goals can be found in the Care Plan section)  Acute Rehab PT Goals Patient Stated Goal: Return to prior level of function and prevent readmission PT Goal Formulation: With patient Time For Goal Achievement: 09/05/16 Potential to Achieve Goals: Good    Frequency Min 2X/week   Barriers to discharge Decreased caregiver support Lives alone    Co-evaluation               End of Session Equipment Utilized During Treatment: Gait belt Activity Tolerance: Patient tolerated treatment well Patient left: in bed;with call bell/phone within reach;Other (comment) (moderate fall risk (9), agrees to call RN ) Nurse Communication: Mobility status;Other (comment) (Cardiopulmonary status with activity. MD notified as well)         Time: 2956-2130: 1356-1415 PT  Time Calculation (min) (ACUTE ONLY): 19 min   Charges:         PT G Codes:       Sharalyn Ink Noble Bodie PT, DPT   Allure Greaser 08/22/2016, 2:29 PM

## 2016-08-22 NOTE — Care Management Important Message (Signed)
Important Message  Patient Details  Name: Audrey Sexton MRN: [id number] Date of Birth: 10-Jan-1951   Medicare Important Message Given:  Yes    [name], RN 08/22/2016, 2:07 PM

## 2016-08-23 ENCOUNTER — Inpatient Hospital Stay: Payer: Medicare HMO

## 2016-08-23 DIAGNOSIS — R0600 Dyspnea, unspecified: Secondary | ICD-10-CM

## 2016-08-23 DIAGNOSIS — Z7189 Other specified counseling: Secondary | ICD-10-CM

## 2016-08-23 DIAGNOSIS — Z515 Encounter for palliative care: Secondary | ICD-10-CM

## 2016-08-23 DIAGNOSIS — R0603 Acute respiratory distress: Secondary | ICD-10-CM

## 2016-08-23 LAB — GLUCOSE, CAPILLARY
GLUCOSE-CAPILLARY: 301 mg/dL — AB (ref 65–99)
GLUCOSE-CAPILLARY: 218 mg/dL — AB (ref 65–99)
GLUCOSE-CAPILLARY: 172 mg/dL — AB (ref 65–99)
GLUCOSE-CAPILLARY: 186 mg/dL — AB (ref 65–99)

## 2016-08-23 NOTE — Plan of Care (Signed)
Problem: Fluid Volume: Goal: Ability to maintain a balanced intake and output will improve Pt is progressing toward goals.

## 2016-08-23 NOTE — Progress Notes (Signed)
[employer] [facility] [place] [facility]   [facility] Rhae Hammockrlene Talent    MR#:  [id number]  DATE OF BIRTH:  1950-10-29  SUBJECTIVE:  CHIEF COMPLAINT:   Chief Complaint  Patient presents with  . Shortness of Breath  . Chest Pain  Still remains quite dyspneic even at rest, tachypneic.  Does not feel she can go home like this REVIEW OF SYSTEMS:  Review of Systems  Constitutional: Negative for chills, fever and weight loss.  HENT: Negative for nosebleeds and sore throat.   Eyes: Negative for blurred vision.  Respiratory: Positive for cough, shortness of breath and wheezing.   Cardiovascular: Negative for chest pain, orthopnea, leg swelling and PND.  Gastrointestinal: Negative for abdominal pain, constipation, diarrhea, heartburn, nausea and vomiting.  Genitourinary: Negative for dysuria and urgency.  Musculoskeletal: Negative for back pain.  Skin: Negative for rash.  Neurological: Negative for dizziness, speech change, focal weakness and headaches.  Endo/Heme/Allergies: Does not bruise/bleed easily.  Psychiatric/Behavioral: Negative for depression.   DRUG ALLERGIES:   Allergies  Allergen Reactions  . Percocet [Oxycodone-Acetaminophen] Nausea And Vomiting  . Vicodin [Hydrocodone-Acetaminophen] Nausea And Vomiting  . Penicillins Hives    Has patient had a PCN reaction causing immediate rash, facial/tongue/throat swelling, SOB or lightheadedness with hypotension: no Has patient had a PCN reaction causing severe rash involving mucus membranes or skin necrosis: no Has patient had a PCN reaction that required hospitalization  no Has patient had a PCN reaction occurring within the last 10 years: no If all of the above answers are "NO", then may proceed with Cephalosporin use.    VITALS:  Blood pressure (!) 154/64, pulse (!) 103, temperature 97.9 F (36.6 C), temperature source Oral, resp. rate 18, height 5\' 4"  (1.626 m), weight 95.3 kg (210 lb), SpO2 95 %. PHYSICAL  EXAMINATION:  Physical Exam  Constitutional: She is oriented to person, place, and time and well-developed, well-nourished, and in no distress.  HENT:  Head: Normocephalic and atraumatic.  Eyes: Conjunctivae and EOM are normal. Pupils are equal, round, and reactive to light.  Neck: Normal range of motion. Neck supple. No tracheal deviation present. No thyromegaly present.  Cardiovascular: Normal rate, regular rhythm and normal heart sounds.   Pulmonary/Chest: Tachypnea noted. She is in respiratory distress. She has wheezes. She has rhonchi. She exhibits no tenderness.  Abdominal: Soft. Bowel sounds are normal. She exhibits no distension. There is no tenderness.  Musculoskeletal: Normal range of motion.  Neurological: She is alert and oriented to person, place, and time. No cranial nerve deficit.  Skin: Skin is warm and dry. No rash noted.  Psychiatric: Mood and affect normal.   LABORATORY PANEL:   CBC  Recent Labs Lab 08/18/16 0351  WBC 9.3  HGB 12.3  HCT 36.7  PLT 245   ------------------------------------------------------------------------------------------------------------------ Chemistries   Recent Labs Lab 08/17/16 2012  08/19/16 0433  NA 138  < > 142  K 3.0*  < > 4.1  CL 101  < > 105  CO2 27  < > 30  GLUCOSE 126*  < > 222*  BUN 15  < > 19  CREATININE 1.37*  < > 1.25*  CALCIUM 8.8*  < > 8.8*  MG  --   < > 1.9  AST 38  --   --   ALT 25  --   --   ALKPHOS 41  --   --   BILITOT <0.1*  --   --   < > = values in  this interval not displayed. RADIOLOGY:  Dg Chest Port 1 View  Result Date: 08/23/2016 CLINICAL DATA:  Acute respiratory failure. EXAM: PORTABLE CHEST 1 VIEW COMPARISON:  August 17, 2016 FINDINGS: No pneumothorax. Persistent emphysematous changes in the apices with increased stable interstitial markings in the right mid lung and bases, unchanged. No acute interval change. IMPRESSION: No acute interval change. Electronically Signed   By: Gerome Samavid  Williams  III M.D   On: 08/23/2016 07:19   ASSESSMENT AND PLAN:  [name], asthma, hypertension, hyperlipidemianow being admitted with:  1. Acute exacerbation of COPD - Still reporting shortness of breath continue IV steroids and azithromycin -Continue Spiriva and Breo Ellipta - Nebulizers scheduled and albuterol as needed O2 therapy and expectorants as needed.  - Consider pulmonary rehab as an outpt and Follows with pulmo Dr Welton FlakesKhan as an outpt  - Appreciate pulmonary input  - hold off D/C today, uses 3 L oxygen at home chronically   2. hypokalemia-replaced  3. chest pain, likely secondary to COPD exacerbation and cough -Ruled out acute MI troponins 3 are negative  - discontinue Telemetry  4. Uncontrolled hypertension - Increase dose of Cardizem and continue hydralazine.  5. History of hyperlipidemia-continue Lipitor  6. History of lower extremity edema-continue Lasix 40 mg once daily  7. DM: continue moderate dose SSI, novolog 5 units TID and Hba1C 5.8    Palliative care input appreciated - likely discharge home with home health and palliative care to follow at home   All the records are reviewed and case discussed with Care Management/Social Worker. Management plans discussed with the patient, family and they are in agreement.  CODE STATUS: full code  TOTAL TIME TAKING CARE OF THIS PATIENT: 35 minutes.   More than 50% of the time was spent in counseling/coordination of care: YES  POSSIBLE D/C IN 1-2 DAYS, DEPENDING ON CLINICAL CONDITION.   Delfino LovettVipul Eliyas Suddreth M.D on 08/23/2016 at 1:54 PM  Between 7am to 6pm - Pager - [phone]  After 6pm go to [email or web address] - Social research officer, governmentpassword EPAS [facility]  [facility]  Office  [phone]  CC: Primary care physician; Oswaldo ConroyBender, Abby Daneele, MD  Note: This dictation was prepared with Dragon dictation along with smaller phrase technology. Any transcriptional errors  that result from this process are unintentional.

## 2016-08-23 NOTE — Progress Notes (Signed)
Shift assessment completed at 0730. Pt is easily awakened, alert and oriented x4, O2 is on at baseline of 3LNC. Lungs are decreased throughout. HR is regular, abdomen is soft, bs heard. Pt is wearing incontinence brief, ppp, no edema noted. Pt has PIV's intact to bilat Ac's, sites are free of redness and swelling. Pt has call bell in reach.

## 2016-08-23 NOTE — Progress Notes (Signed)
PT Cancellation Note  Patient Details Name: Audrey Sexton MRN: [id number] DOB: 21-Apr-1951   Cancelled Treatment:    Reason Eval/Treat Not Completed: Patient declined, no reason specified. Treatment attempted; pt refuses at this time and states "maybe later, I'm trying to catch my breath". Re attempt treatment at a later time/date.   Scot DockHeidi E Jaloni Sorber, PTA 08/23/2016, 12:06 PM

## 2016-08-23 NOTE — Progress Notes (Signed)
PT Cancellation Note  Patient Details Name: Audrey Sexton MRN: [id number] DOB: Oct 22, 1950   Cancelled Treatment:    Reason Eval/Treat Not Completed: Patient declined, no reason specified. Treatment attempted for the second time. Pt sitting in mild distress with shortness of breath, wheezing and coughing attacks. Refuses PT. Pt instructed to call the nurse if she continues to worsen; pt states she is "okay" right now, but will. Re attempt at a later date.    Scot DockHeidi E Barnes, PTA 08/23/2016, 1:36 PM

## 2016-08-23 NOTE — Care Management (Signed)
Spoke with Lorinda CreedMary Larach, NP with palliative care team. She is recommending palliative care in the home. Patient is agreeable.  Referral called to Dayna BarkerKaren Robertson with [facility].

## 2016-08-23 NOTE — Progress Notes (Signed)
New referral for Palliative services at home following discharge [facility] from [facility] Jacobs. Palliative referral made aware. Thank you. Dayna BarkerKaren Robertson RN, BSN, [facility] [place], hospital Liaison [phone]

## 2016-08-23 NOTE — Consult Note (Signed)
Consultation Note Date: 08/23/2016   Patient Name: Audrey Sexton  DOB: 06/05/1951  MRN: [id number]  Age / Sex: 65 y.o., female  PCP: Oswaldo Conroy, MD Referring Physician: Delfino Lovett, MD  Reason for Consultation: Establishing goals of care, Non pain symptom management and Psychosocial/spiritual support  HPI/Patient Profile: 65 y.o. female admitted on 08/17/2016 with Audrey Sexton is a 65 y.o. 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  Patient was in a usual state of health until this afternoon when she developed the sudden onset of shortness of breath associated with nonproductive cough, mild nausea with her coughing and new central chest pain that is worse with breathing. She reports that she has not taken any nebulizer therapy at home for this episode and she has not sought any other medical help prior to her arrival in the emergency department this evening.   Admitted for stabilization and currently is looking a a discharge home in the next day or two.   She tells me she does not see her pulmonologist as regularly as she'd like to to financial reasons.  She faces the emotional and financial struggles of living with chronic disease.    Clinical Assessment and Goals of Care:  This NP Lorinda Creed reviewed medical records, received report from team, assessed the patient and then meet at the patient's bedside   to discuss diagnosis, prognosis, GOC, EOL wishes disposition and options.  A detailed discussion was had today regarding advanced directives.  Concepts specific to code status, artifical feeding and hydration, continued IV antibiotics and rehospitalization was had.  The difference between a aggressive medical intervention path  and a palliative comfort care  path for this patient at this time was had.  Values and goals of care important to patient and family were attempted to be elicited.  Concept of  Palliative Care was discussed    Questions and concerns addressed.   Family encouraged to call with questions or concerns.  PMT will continue to support holistically.  HCPOA- no documented HPOA, she is  encouraged to secure.  At this time daughter Sorah Falkenstein is patiet's main support person.  Today we discussed the importance of discussing with her family advanced directive decisions and living  SUMMARY OF RECOMMENDATIONS    Code Status/Advance Care Planning:  Full code   Symptom Management:   Dyspnea: - close f/u with OP pulmonologist for chronic disease management   - compliance with medications -recommend pulmonary rehab - discussed relaxation/breathing techniques   Palliative Prophylaxis:   Aspiration, Bowel Regimen and Oral Care  Additional Recommendations (Limitations, Scope, Preferences):  Patient is open to all offered and available medical interventions to prolong life  Psycho-social/Spiritual:   Desire for further Chaplaincy support:no  Additional Recommendations: Education on home palliative services  Prognosis:   Unable to determine  Discharge Planning: Home with Home Health      Primary Diagnoses: Present on Admission: .  COPD exacerbation (HCC)   I have reviewed the medical record, interviewed the patient and family, and examined the patient. The following aspects are pertinent.  Past Medical History:  Diagnosis Date  . Asthma   . Carpal tunnel syndrome of left wrist   . COPD (chronic obstructive pulmonary disease) (HCC)   . Hyperlipemia   . Hypertension    Social History   Social History  . Marital status: Single    Spouse name: N/A  . Number of children: N/A  . Years of education: N/A   Social History Main Topics  . Smoking status: Former Smoker    Types: Cigarettes  . Smokeless  tobacco: Never Used  . Alcohol use No  . Drug use: Unknown  . Sexual activity: Not Asked   Other Topics Concern  . None   Social History Narrative  . None   Family History  Problem Relation Age of Onset  . Heart disease Mother     died at 55  . Diabetes Mother   . Alzheimer's disease Father     died at 46  . Parkinson's disease Sister   . Heart disease Brother     died at 35  . Diabetes Brother   . Kidney disease Sister   . Breast cancer Sister    Scheduled Meds: . atorvastatin  40 mg Oral Daily  . azithromycin  500 mg Oral Daily  . cholecalciferol  1,000 Units Oral Daily  . diltiazem  300 mg Oral Daily  . enoxaparin (LOVENOX) injection  40 mg Subcutaneous QHS  . fluticasone furoate-vilanterol  1 puff Inhalation Daily  . furosemide  40 mg Oral Daily  . hydrALAZINE  25 mg Oral Q8H  . insulin aspart  0-15 Units Subcutaneous TID WC  . insulin aspart  0-5 Units Subcutaneous QHS  . insulin aspart  5 Units Subcutaneous TID WC  . ipratropium-albuterol  3 mL Nebulization Q4H  . methylPREDNISolone (SOLU-MEDROL) injection  60 mg Intravenous Q12H  . oxybutynin  5 mg Oral Daily  . pantoprazole  40 mg Oral QAC breakfast  . PARoxetine  20 mg Oral Daily  . tiotropium  1 capsule Inhalation Daily  . vitamin B-12  2,000 mcg Oral Daily  . vitamin C  1,000 mg Oral Daily   Continuous Infusions: PRN Meds:.acetaminophen **OR** acetaminophen, albuterol, benzonatate, bisacodyl, chlorpheniramine-HYDROcodone, magnesium citrate, ondansetron **OR** ondansetron (ZOFRAN) IV, senna-docusate, zolpidem Medications Prior to Admission:  Prior to Admission medications   Medication Sig Start Date End Date Taking? Authorizing Provider  Acetaminophen 500 MG coapsule Take 500 mg by mouth as needed.   Yes Historical Provider, MD  albuterol (ACCUNEB) 0.63 MG/3ML nebulizer solution Inhale 3 mLs into the lungs 3 (three) times daily as needed.    Yes Historical Provider, MD  albuterol (PROVENTIL HFA;VENTOLIN  HFA) 108 (90 Base) MCG/ACT inhaler Inhale 2 puffs into the lungs every 4 (four) hours as needed.   Yes Historical Provider, MD  Ascorbic Acid (VITAMIN C) 1000 MG tablet Take 1,000 mg by mouth daily.   Yes Historical Provider, MD  atorvastatin (LIPITOR) 40 MG tablet Take 40 mg by mouth daily.   Yes Historical Provider, MD  Cholecalciferol (VITAMIN D-1000 MAX ST) 1000 units tablet Take 1,000 Units by mouth daily.   Yes Historical Provider, MD  diltiazem (CARDIZEM CD) 240 MG 24 hr capsule Take 240 mg by mouth daily.   Yes Historical Provider, MD  fluticasone furoate-vilanterol (BREO ELLIPTA) 100-25 MCG/INH AEPB Inhale 1 puff into the lungs daily.  Yes Historical Provider, MD  furosemide (LASIX) 40 MG tablet Take 40 mg by mouth daily.   Yes Historical Provider, MD  Multiple Vitamin (MULTI-VITAMIN DAILY PO) Take 1 tablet by mouth daily.   Yes Historical Provider, MD  omeprazole (PRILOSEC) 20 MG capsule Take 20 mg by mouth daily.   Yes Historical Provider, MD  oxybutynin (DITROPAN) 5 MG tablet Take 5 mg by mouth daily.   Yes Historical Provider, MD  PARoxetine (PAXIL) 20 MG tablet Take 20 mg by mouth daily.   Yes Historical Provider, MD  tiotropium (SPIRIVA) 18 MCG inhalation capsule Place 1 capsule into inhaler and inhale daily.   Yes Historical Provider, MD  vitamin B-12 (CYANOCOBALAMIN) 1000 MCG tablet Take 2,000 mcg by mouth daily.   Yes Historical Provider, MD  benzonatate (TESSALON) 200 MG capsule Take 1 capsule (200 mg total) by mouth 3 (three) times daily as needed for cough. 08/21/16   Delfino LovettVipul Shah, MD  predniSONE (STERAPRED UNI-PAK 21 TAB) 10 MG (21) TBPK tablet Take 1 tablet (10 mg total) by mouth daily. Start 60 mg once daily, taper 10 mg daily until done 08/21/16   Delfino LovettVipul Shah, MD   Allergies  Allergen Reactions  . Percocet [Oxycodone-Acetaminophen] Nausea And Vomiting  . Vicodin [Hydrocodone-Acetaminophen] Nausea And Vomiting  . Penicillins Hives    Has patient had a PCN reaction causing  immediate rash, facial/tongue/throat swelling, SOB or lightheadedness with hypotension: no Has patient had a PCN reaction causing severe rash involving mucus membranes or skin necrosis: no Has patient had a PCN reaction that required hospitalization  no Has patient had a PCN reaction occurring within the last 10 years: no If all of the above answers are "NO", then may proceed with Cephalosporin use.    Review of Systems  Constitutional: Positive for fatigue.  Respiratory: Positive for shortness of breath.   Neurological: Positive for weakness.    Physical Exam  Constitutional: She is oriented to person, place, and time. She appears well-developed. She appears ill. Nasal cannula in place.  Cardiovascular: Tachycardia present.   Pulmonary/Chest: Tachypnea noted. She has decreased breath sounds in the right lower field and the left lower field.  Neurological: She is alert and oriented to person, place, and time.  Skin: Skin is warm and dry.    Vital Signs: BP (!) 154/64 (BP Location: Right Arm)   Pulse (!) 103   Temp 97.9 F (36.6 C) (Oral)   Resp 18   Ht 5\' 4"  (1.626 m)   Wt 95.3 kg (210 lb)   SpO2 95%   BMI 36.05 kg/m  Pain Assessment: No/denies pain POSS *See Group Information*: 1-Acceptable,Awake and alert Pain Score: 0-No pain   SpO2: SpO2: 95 % O2 Device:SpO2: 95 % O2 Flow Rate: .O2 Flow Rate (L/min): 3 L/min  IO: Intake/output summary:  Intake/Output Summary (Last 24 hours) at 08/23/16 0821 Last data filed at 08/23/16 0455  Gross per 24 hour  Intake              480 ml  Output              550 ml  Net              -70 ml    LBM: Last BM Date: 08/17/16 Baseline Weight: Weight: 95.3 kg (210 lb) Most recent weight: Weight: 95.3 kg (210 lb)      Palliative Assessment/Data:70 %    Discussed with Dr Clelia CroftShaw   Time In: 0700 Time Out: 0815 Time Total:  75 min Greater than 50%  of this time was spent counseling and coordinating care related to the above assessment  and plan.  Signed by: Lorinda CreedLARACH, Lam Bjorklund, NP   Please contact Palliative Medicine Team phone at [phone] for questions and concerns.  For individual provider: See Loretha StaplerAmion

## 2016-08-23 NOTE — Progress Notes (Signed)
Inpatient Diabetes Program Recommendations  AACE/ADA: New Consensus Statement on Inpatient Glycemic Control (2015)  Target Ranges:  Prepandial:   less than 140 mg/dL      Peak postprandial:   less than 180 mg/dL (1-2 hours)      Critically ill patients:  140 - 180 mg/dL   Results for Audrey Sexton, Audrey Sexton (MRN [id number]) as of 08/23/2016 12:23  Ref. Range 08/22/2016 07:41 08/22/2016 11:13 08/22/2016 16:08 08/22/2016 21:12 08/23/2016 07:54 08/23/2016 12:04  Glucose-Capillary Latest Ref Range: 65 - 99 mg/dL 409270 (H) 811291 (H) 914133 (H) 200 (H) 301 (H) 218 (H)   Review of Glycemic Control  Diabetes history: None Current orders for Inpatient glycemic control: Novolog Moderate (0-15 units) TID + HS (0-5 units) scale + Novolog 5 units TID meal coverage  Inpatient Diabetes Program Recommendations:   Patient is on IV Solumedrol 60 mg Q12hours. Glucose mostly in the 200's, higher than inpatient goal 140-180 mg/dl. If patient remains on steroid dose, please consider increasing meal coverage to Novolog 7-8 units.  Could also increase Novolog Correction scale to Resistant (0-20 units) TID. Will need to be reduced as steroids are reduced.  Thanks,  Christena DeemShannon Cambri Plourde RN, MSN, [facility] Hospital[facility] Inpatient Diabetes Coordinator Team Pager [phone] (8a-5p)

## 2016-08-23 NOTE — Progress Notes (Signed)
Pt eating lunch on rounds at1215, pt is sob at bedside, stated she had to rest to continue eating. o2 unchanged. Call bell in reach.

## 2016-08-24 DIAGNOSIS — R0603 Acute respiratory distress: Secondary | ICD-10-CM

## 2016-08-24 LAB — GLUCOSE, CAPILLARY
GLUCOSE-CAPILLARY: 275 mg/dL — AB (ref 65–99)
Glucose-Capillary: 297 mg/dL — ABNORMAL HIGH (ref 65–99)

## 2016-08-24 NOTE — Progress Notes (Signed)
Patient was discharged home with family. IVs removed with caths intact. Reviewed meds, follow-up appointment, and last dose given. Allowed time for questions. Daughter was in room for discharge instructions.

## 2016-08-24 NOTE — Care Management Note (Signed)
Case Management Note  Patient Details  Name: Audrey Sexton MRN: [id number] Date of Birth: 1951/08/13  Subjective/Objective:   Discharging today.                  Action/Plan: Advanced notified of discharge.   Expected Discharge Date:    08/24/2016              Expected Discharge Plan:  Home w Home Health Services  In-House Referral:     Discharge planning Services  CM Consult  Post Acute Care Choice:  Home Health Choice offered to:  Patient  DME Arranged:    DME Agency:     HH Arranged:  RN, PT HH Agency:  [facility]  Status of Service:  Completed, signed off  If discussed at Long Length of Stay Meetings, dates discussed:    Additional Comments:  Marily MemosLisa M Blenda Wisecup, RN 08/24/2016, 9:03 AM

## 2016-08-24 NOTE — Progress Notes (Signed)
PT Cancellation Note  Patient Details Name: Audrey Sexton MRN: [id number] DOB: Oct 23, 1950   Cancelled Treatment:    Reason Eval/Treat Not Completed: Patient declined, no reason specified. Treatment attempted; pt declined noting she is feeling better today regarding her breathing and states she ambulated with nursing this morning. Pt has a discharge to home order in place with palliative services and home health PT.    Scot DockHeidi E Barnes, PTA 08/24/2016, 11:44 AM

## 2016-08-26 NOTE — Discharge Summary (Signed)
[facility] [place] [facility]   PATIENT NAME: Audrey Sexton    MR#:  [id number]  DATE OF BIRTH:  12-01-1950  DATE OF ADMISSION:  08/17/2016   ADMITTING PHYSICIAN: Tonye Royalty, DO  DATE OF DISCHARGE: 08/24/2016  4:21 PM  PRIMARY CARE PHYSICIAN: Oswaldo Conroy, MD   ADMISSION DIAGNOSIS:  COPD exacerbation (HCC) [J44.1] Chest pain, unspecified type [R07.9] DISCHARGE DIAGNOSIS:  Active Problems:   COPD exacerbation (HCC)   DNR (do not resuscitate) discussion   Palliative care by specialist   Dyspnea   Acute respiratory distress  SECONDARY DIAGNOSIS:   Past Medical History:  Diagnosis Date  . Asthma   . Carpal tunnel syndrome of left wrist   . COPD (chronic obstructive pulmonary disease) (HCC)   . Hyperlipemia   . Hypertension    HOSPITAL COURSE:  [name], asthma, hypertension, hyperlipidemianow being admitted with:  1. Acute exacerbation of COPD - much improved with steroids and abx - uses 3 L oxygen at home chronically   2. hypokalemia-replaced  3. Chest pain, likely secondary to COPD exacerbation and cough -Ruled out acute MI troponins 3 are negative   4. Uncontrolled hypertension - much better after adjustment in meds.  5. History of hyperlipidemia-continue Lipitor  6. History of lower extremity edema- improved with diuresis by Lasix.  7. DM: continue moderate dose SSI, novolog 5 units TID and Hba1C 5.8  DISCHARGE CONDITIONS:  STABLE CONSULTS OBTAINED:   DRUG ALLERGIES:   Allergies  Allergen Reactions  . Percocet [Oxycodone-Acetaminophen] Nausea And Vomiting  . Vicodin [Hydrocodone-Acetaminophen] Nausea And Vomiting  . Penicillins Hives    Has patient had a PCN reaction causing immediate rash, facial/tongue/throat swelling, SOB or lightheadedness with hypotension: no Has patient had a PCN reaction causing severe rash involving mucus membranes or  skin necrosis: no Has patient had a PCN reaction that required hospitalization  no Has patient had a PCN reaction occurring within the last 10 years: no If all of the above answers are "NO", then may proceed with Cephalosporin use.    DISCHARGE MEDICATIONS:   Allergies as of 08/24/2016      Reactions   Percocet [oxycodone-acetaminophen] Nausea And Vomiting   Vicodin [hydrocodone-acetaminophen] Nausea And Vomiting   Penicillins Hives   Has patient had a PCN reaction causing immediate rash, facial/tongue/throat swelling, SOB or lightheadedness with hypotension: no Has patient had a PCN reaction causing severe rash involving mucus membranes or skin necrosis: no Has patient had a PCN reaction that required hospitalization  no Has patient had a PCN reaction occurring within the last 10 years: no If all of the above answers are "NO", then may proceed with Cephalosporin use.      Medication List    TAKE these medications   Acetaminophen 500 MG coapsule Take 500 mg by mouth as needed.   albuterol 0.63 MG/3ML nebulizer solution Commonly known as:  ACCUNEB Inhale 3 mLs into the lungs 3 (three) times daily as needed.   albuterol 108 (90 Base) MCG/ACT inhaler Commonly known as:  PROVENTIL HFA;VENTOLIN HFA Inhale 2 puffs into the lungs every 4 (four) hours as needed.   atorvastatin 40 MG tablet Commonly known as:  LIPITOR Take 40 mg by mouth daily.   benzonatate 200 MG capsule Commonly known as:  TESSALON Take 1 capsule (200 mg total) by mouth 3 (three) times daily as needed for cough.   diltiazem 240 MG 24 hr capsule  Commonly known as:  CARDIZEM CD Take 240 mg by mouth daily.   fluticasone furoate-vilanterol 100-25 MCG/INH Aepb Commonly known as:  BREO ELLIPTA Inhale 1 puff into the lungs daily.   furosemide 40 MG tablet Commonly known as:  LASIX Take 40 mg by mouth daily.   MULTI-VITAMIN DAILY PO Take 1 tablet by mouth daily.   omeprazole 20 MG capsule Commonly known  as:  PRILOSEC Take 20 mg by mouth daily.   oxybutynin 5 MG tablet Commonly known as:  DITROPAN Take 5 mg by mouth daily.   PARoxetine 20 MG tablet Commonly known as:  PAXIL Take 20 mg by mouth daily.   predniSONE 10 MG (21) Tbpk tablet Commonly known as:  STERAPRED UNI-PAK 21 TAB Take 1 tablet (10 mg total) by mouth daily. Start 60 mg once daily, taper 10 mg daily until done   tiotropium 18 MCG inhalation capsule Commonly known as:  SPIRIVA Place 1 capsule into inhaler and inhale daily.   vitamin B-12 1000 MCG tablet Commonly known as:  CYANOCOBALAMIN Take 2,000 mcg by mouth daily.   vitamin C 1000 MG tablet Take 1,000 mg by mouth daily.   VITAMIN D-1000 MAX ST 1000 units tablet Generic drug:  Cholecalciferol Take 1,000 Units by mouth daily.      DISCHARGE INSTRUCTIONS:   DIET:  Cardiac diet DISCHARGE CONDITION:  Good ACTIVITY:  Activity as tolerated OXYGEN:  Home Oxygen: Yes.    Oxygen Delivery: 3 liters/min via Patient connected to nasal cannula oxygen DISCHARGE LOCATION:  home   If you experience worsening of your admission symptoms, develop shortness of breath, life threatening emergency, suicidal or homicidal thoughts you must seek medical attention immediately by calling 911 or calling your MD immediately  if symptoms less severe.  You Must read complete instructions/literature along with all the possible adverse reactions/side effects for all the Medicines you take and that have been prescribed to you. Take any new Medicines after you have completely understood and accpet all the possible adverse reactions/side effects.   Please note  You were cared for by a hospitalist during your hospital stay. If you have any questions about your discharge medications or the care you received while you were in the hospital after you are discharged, you can call the unit and asked to speak with the hospitalist on call if the hospitalist that took care of you is not  available. Once you are discharged, your primary care physician will handle any further medical issues. Please note that NO REFILLS for any discharge medications will be authorized once you are discharged, as it is imperative that you return to your primary care physician (or establish a relationship with a primary care physician if you do not have one) for your aftercare needs so that they can reassess your need for medications and monitor your lab values.    On the day of Discharge:  VITAL SIGNS:  Blood pressure (!) 143/69, pulse (!) 101, temperature 98.4 F (36.9 C), temperature source Oral, resp. rate 20, height 5\' 4"  (1.626 m), weight 95.3 kg (210 lb), SpO2 97 %. PHYSICAL EXAMINATION:  GENERAL:  65 y.o.-year-old patient lying in the bed with no acute distress.  EYES: Pupils equal, round, reactive to light and accommodation. No scleral icterus. Extraocular muscles intact.  HEENT: Head atraumatic, normocephalic. Oropharynx and nasopharynx clear.  NECK:  Supple, no jugular venous distention. No thyroid enlargement, no tenderness.  LUNGS: Normal breath sounds bilaterally, no wheezing, rales,rhonchi or crepitation. No use of accessory  muscles of respiration.  CARDIOVASCULAR: S1, S2 normal. No murmurs, rubs, or gallops.  ABDOMEN: Soft, non-tender, non-distended. Bowel sounds present. No organomegaly or mass.  EXTREMITIES: No pedal edema, cyanosis, or clubbing.  NEUROLOGIC: Cranial nerves II through XII are intact. Muscle strength 5/5 in all extremities. Sensation intact. Gait not checked.  PSYCHIATRIC: The patient is alert and oriented x 3.  SKIN: No obvious rash, lesion, or ulcer.  DATA REVIEW:   CBC No results for input(s): WBC, HGB, HCT, PLT in the last 168 hours.  Chemistries  No results for input(s): NA, K, CL, CO2, GLUCOSE, BUN, CREATININE, CALCIUM, MG, AST, ALT, ALKPHOS, BILITOT in the last 168 hours.  Invalid input(s): GFRCGP   Microbiology Results  Results for orders placed or  performed in visit on 06/13/14  Culture, blood (single)     Status: None   Collection Time: 06/13/14  4:00 PM  Result Value Ref Range Status   Micro Text Report   Final       COMMENT                   NO GROWTH AEROBICALLY/ANAEROBICALLY IN 5 DAYS   ANTIBIOTIC                                                      Culture, blood (single)     Status: None   Collection Time: 06/13/14  4:03 PM  Result Value Ref Range Status   Micro Text Report   Final       COMMENT                   NO GROWTH AEROBICALLY/ANAEROBICALLY IN 5 DAYS   ANTIBIOTIC                                                      Culture, expectorated sputum-assessment     Status: None   Collection Time: 06/14/14  1:20 AM  Result Value Ref Range Status   Micro Text Report   Final       ORGANISM 1                LIGHT GROWTH STAPHYLOCOCCUS AUREUS   COMMENT                   -   GRAM STAIN                MANY WHITE BLOOD CELLS   GRAM STAIN                MANY GRAM POSITIVE COCCI IN PAIRS IN CHAINS   GRAM STAIN                EXCELLENT SPECIMEN-90-100% WBC   ANTIBIOTIC                    ORG#1     CIPROFLOXACIN                 S         CLINDAMYCIN                   S  ERYTHROMYCIN                  S         GENTAMICIN                    S         LEVOFLOXACIN                  S         LINEZOLID                     S         OXACILLIN                     S         TIGECYCLINE                   S         CEFOXITIN SCREEN              NEGATIVE  INDUCIBLE CLINDAMYCIN RESISTANNEGATIVE  TRIMETHOPRIM/SULFAMETHOXAZOLE S           Culture, fungus without smear     Status: None   Collection Time: 06/24/14  9:45 AM  Result Value Ref Range Status   Micro Text Report   Final       SOURCE: BRONCH WASHING    ORGANISM 1                CANDIDA ALBICANS   COMMENT                   -   ANTIBIOTIC                    ORG#1    ORG#2                                                          RADIOLOGY:  No results  found.   Management plans discussed with the patient, family and they are in agreement.  CODE STATUS: FULL CODE  TOTAL TIME TAKING CARE OF THIS PATIENT: 45 minutes.    Delfino Lovett M.D on 08/26/2016 at 9:37 AM  Between 7am to 6pm - Pager - [phone]  After 6pm go to [email or web address] - Social research officer, government  [facility] [place] [facility]  Office  [phone]  CC: Primary care physician; Oswaldo Conroy, MD   Note: This dictation was prepared with Dragon dictation along with smaller phrase technology. Any transcriptional errors that result from this process are unintentional.

## 2016-09-01 NOTE — Progress Notes (Signed)
[facility]  Patient Status: not taken under care, multiple messages have been left to [facility]. MD notified. Gweneth DimitriLisa Jacobs, CM and Collie SiadAngela Johnson, CM both notified.     Dimple CaseyJason E Hinton 09/01/2016, 9:24 AM

## 2017-01-01 ENCOUNTER — Emergency Department
Admission: EM | Admit: 2017-01-01 | Discharge: 2017-01-01 | Disposition: A | Discharge status: Home / Self Care | Payer: Medicare HMO | Attending: Emergency Medicine | Admitting: Emergency Medicine

## 2017-01-01 ENCOUNTER — Emergency Department: Payer: Medicare HMO

## 2017-01-01 ENCOUNTER — Encounter: Payer: Self-pay | Admitting: Emergency Medicine

## 2017-01-01 DIAGNOSIS — R0602 Shortness of breath: Secondary | ICD-10-CM | POA: Diagnosis present

## 2017-01-01 DIAGNOSIS — J441 Chronic obstructive pulmonary disease with (acute) exacerbation: Secondary | ICD-10-CM | POA: Diagnosis not present

## 2017-01-01 DIAGNOSIS — Z87891 Personal history of nicotine dependence: Secondary | ICD-10-CM | POA: Insufficient documentation

## 2017-01-01 DIAGNOSIS — Z79899 Other long term (current) drug therapy: Secondary | ICD-10-CM | POA: Diagnosis not present

## 2017-01-01 DIAGNOSIS — I10 Essential (primary) hypertension: Secondary | ICD-10-CM | POA: Insufficient documentation

## 2017-01-01 DIAGNOSIS — J45909 Unspecified asthma, uncomplicated: Secondary | ICD-10-CM | POA: Insufficient documentation

## 2017-01-01 LAB — CBC
HEMATOCRIT: 37.6 % (ref 35.0–47.0)
Hemoglobin: 12.7 g/dL (ref 12.0–16.0)
MCH: 29.4 pg (ref 26.0–34.0)
MCHC: 33.8 g/dL (ref 32.0–36.0)
MCV: 87 fL (ref 80.0–100.0)
PLATELETS: 228 10*3/uL (ref 150–440)
RBC: 4.31 MIL/uL (ref 3.80–5.20)
RDW: 14.7 % — AB (ref 11.5–14.5)
WBC: 7.7 10*3/uL (ref 3.6–11.0)

## 2017-01-01 LAB — BASIC METABOLIC PANEL
Anion gap: 7 (ref 5–15)
BUN: 16 mg/dL (ref 6–20)
CO2: 30 mmol/L (ref 22–32)
CREATININE: 1.13 mg/dL — AB (ref 0.44–1.00)
Calcium: 9.8 mg/dL (ref 8.9–10.3)
Chloride: 106 mmol/L (ref 101–111)
GFR calc Af Amer: 57 mL/min — ABNORMAL LOW (ref >60)
GFR, EST NON AFRICAN AMERICAN: 49 mL/min — AB (ref >60)
GLUCOSE: 97 mg/dL (ref 65–99)
POTASSIUM: 4.3 mmol/L (ref 3.5–5.1)
Sodium: 143 mmol/L (ref 135–145)

## 2017-01-01 LAB — TROPONIN I: Troponin I: <0.03 ng/mL (ref <0.03)

## 2017-01-01 MED ORDER — ALBUTEROL SULFATE 0.63 MG/3ML IN NEBU: 3.0000 mL | INHALATION_SOLUTION | RESPIRATORY_TRACT | 3 refills | Status: DC | PRN

## 2017-01-01 MED ORDER — IPRATROPIUM-ALBUTEROL 0.5-2.5 (3) MG/3ML IN SOLN
3.0000 mL | Freq: Once | RESPIRATORY_TRACT | Status: AC
  Administered 2017-01-01: 3 mL via RESPIRATORY_TRACT
  Filled 2017-01-01: qty 3

## 2017-01-01 MED ORDER — ALBUTEROL SULFATE HFA 108 (90 BASE) MCG/ACT IN AERS: INHALATION_SPRAY | RESPIRATORY_TRACT | 1 refills | Status: AC

## 2017-01-01 MED ORDER — PREDNISONE 20 MG PO TABS: 40.0000 mg | ORAL_TABLET | Freq: Every day | ORAL | 0 refills | Status: AC

## 2017-01-01 MED ORDER — PREDNISONE 20 MG PO TABS
60.0000 mg | ORAL_TABLET | ORAL | Status: AC
  Administered 2017-01-01: 60 mg via ORAL
  Filled 2017-01-01: qty 3

## 2017-01-01 NOTE — ED Triage Notes (Signed)
Pt with chest pain and shortness of breath, reports pressure and feels like she can't take a deep breath. Pt has been short of breath today, on 3L chronic oxygen.

## 2017-01-01 NOTE — ED Provider Notes (Signed)
[facility] Emergency Department Provider Note  ____________________________________________   First MD Initiated Contact with Patient 01/01/17 1411     (approximate)  I have reviewed the triage vital signs and the nursing notes.   HISTORY  Chief Complaint Shortness of Breath and Chest Pain    HPI Audrey Sexton is a 66 y.o. female with severe COPD on 3 L of oxygen at home who presents for evaluation of worsening shortness of breath and some associated chest pain.  She states the symptoms started acutely this morning and feels similar to her prior exacerbations.  The chest pain is aching and the shortness of breath makes her feel like she cannot get enough oxygen or take a deep breath.  Of note her SPO2 has been 94-99% on her usual 3 L of oxygen.  She reports that she has not recently been on prednisone but has been using her nebulizer treatments.  Nothing in particular makes her symptoms better nor worse.  She denies fever/chills, nausea, vomiting, abdominal pain, dysuria.   Past Medical History:  Diagnosis Date  . Asthma   . Carpal tunnel syndrome of left wrist   . COPD (chronic obstructive pulmonary disease) (HCC)   . Hyperlipemia   . Hypertension     Patient Active Problem List   Diagnosis Date Noted  . Acute respiratory distress   . DNR (do not resuscitate) discussion 08/23/2016  . Palliative care by specialist 08/23/2016  . Dyspnea 08/23/2016  . COPD exacerbation (HCC) 08/18/2016    Past Surgical History:  Procedure Laterality Date  . CARPAL TUNNEL RELEASE Left   . fractured tibia Left    repair  . ROTATOR CUFF REPAIR Left   . TUBAL LIGATION      Prior to Admission medications   Medication Sig Start Date End Date Taking? Authorizing Provider  Acetaminophen 500 MG coapsule Take 500 mg by mouth as needed.    [provider]  albuterol (ACCUNEB) 0.63 MG/3ML nebulizer solution Take 3 mLs by nebulization every 4 (four) hours as  needed. 01/01/17   Loleta RoseForbach, Adajah Cocking, MD  albuterol (PROVENTIL HFA;VENTOLIN HFA) 108 (90 Base) MCG/ACT inhaler Inhale 2-4 puffs by mouth every 4 hours as needed for wheezing, cough, and/or shortness of breath 01/01/17   Loleta RoseForbach, Dakwan Pridgen, MD  Ascorbic Acid (VITAMIN C) 1000 MG tablet Take 1,000 mg by mouth daily.    [provider]  atorvastatin (LIPITOR) 40 MG tablet Take 40 mg by mouth daily.    [provider]  benzonatate (TESSALON) 200 MG capsule Take 1 capsule (200 mg total) by mouth 3 (three) times daily as needed for cough. 08/21/16   Delfino LovettShah, Vipul, MD  Cholecalciferol (VITAMIN D-1000 MAX ST) 1000 units tablet Take 1,000 Units by mouth daily.    [provider]  diltiazem (CARDIZEM CD) 240 MG 24 hr capsule Take 240 mg by mouth daily.    [provider]  fluticasone furoate-vilanterol (BREO ELLIPTA) 100-25 MCG/INH AEPB Inhale 1 puff into the lungs daily.    [provider]  furosemide (LASIX) 40 MG tablet Take 40 mg by mouth daily.    [provider]  Multiple Vitamin (MULTI-VITAMIN DAILY PO) Take 1 tablet by mouth daily.    [provider]  omeprazole (PRILOSEC) 20 MG capsule Take 20 mg by mouth daily.    [provider]  oxybutynin (DITROPAN) 5 MG tablet Take 5 mg by mouth daily.    [provider]  PARoxetine (PAXIL) 20 MG tablet  Take 20 mg by mouth daily.    [provider]  predniSONE (DELTASONE) 20 MG tablet Take 2 tablets (40 mg total) by mouth daily. 01/01/17 01/06/17  Loleta Rose, MD  tiotropium (SPIRIVA) 18 MCG inhalation capsule Place 1 capsule into inhaler and inhale daily.    [provider]  vitamin B-12 (CYANOCOBALAMIN) 1000 MCG tablet Take 2,000 mcg by mouth daily.    [provider]    Allergies Percocet [oxycodone-acetaminophen]; Vicodin [hydrocodone-acetaminophen]; and Penicillins  Family History  Problem Relation Age of Onset  . Heart disease Mother     died at 10  .  Diabetes Mother   . Alzheimer's disease Father     died at 30  . Parkinson's disease Sister   . Heart disease Brother     died at 56  . Diabetes Brother   . Kidney disease Sister   . Breast cancer Sister     Social History Social History  Substance Use Topics  . Smoking status: Former Smoker    Types: Cigarettes  . Smokeless tobacco: Never Used  . Alcohol use No    Review of Systems Constitutional: No fever/chills Eyes: No visual changes. ENT: No sore throat. Cardiovascular: Denies chest pain. Respiratory: +shortness of breath. Gastrointestinal: No abdominal pain.  No nausea, no vomiting.  No diarrhea.  No constipation. Genitourinary: Negative for dysuria. Musculoskeletal: Negative for back pain. Integumentary: Negative for rash. Neurological: Negative for headaches, focal weakness or numbness.   ____________________________________________   PHYSICAL EXAM:  VITAL SIGNS: ED Triage Vitals  Enc Vitals Group     BP 01/01/17 1109 (!) 200/86     Pulse Rate 01/01/17 1109 (!) 109     Resp 01/01/17 1109 18     Temp 01/01/17 1109 98.1 F (36.7 C)     Temp Source 01/01/17 1109 Oral     SpO2 01/01/17 1109 94 %     Weight 01/01/17 1129 210 lb (95.3 kg)     Height 01/01/17 1129 5\' 4"  (1.626 m)     Head Circumference --      Peak Flow --      Pain Score 01/01/17 1129 8     Pain Loc --      Pain Edu? --      Excl. in GC? --     Constitutional: Alert and oriented. Appears chronically ill but in no acute distress Eyes: Conjunctivae are normal. PERRL. EOMI. Head: Atraumatic. Nose: No congestion/rhinnorhea. Mouth/Throat: Mucous membranes are moist. Neck: No stridor.  No meningeal signs.   Cardiovascular: Normal rate, regular rhythm. Good peripheral circulation. Grossly normal heart sounds. Respiratory: Normal respiratory effort.  No retractions.  Expiratory wheezing throughout that is mild to moderate but with good air movement Gastrointestinal: Soft and nontender. No  distention.  Musculoskeletal: No lower extremity tenderness nor edema. No gross deformities of extremities. Neurologic:  Normal speech and language. No gross focal neurologic deficits are appreciated.  Skin:  Skin is warm, dry and intact. No rash noted. Psychiatric: Mood and affect are normal. Speech and behavior are normal.  ____________________________________________   LABS (all labs ordered are listed, but only abnormal results are displayed)  Labs Reviewed  BASIC METABOLIC PANEL - Abnormal; Notable for the following:       Result Value   Creatinine, Ser 1.13 (*)    GFR calc non Af Amer 49 (*)    GFR calc Af Amer 57 (*)    All other components within normal limits  CBC - Abnormal;  Notable for the following:    RDW 14.7 (*)    All other components within normal limits  TROPONIN I  TROPONIN I   ____________________________________________  EKG  ED ECG REPORT I, Avarie Tavano, the attending physician, personally viewed and interpreted this ECG.  Date: 01/01/2017 EKG Time: 11:27 Rate: 109 Rhythm: sinus tachycardia QRS Axis: normal Intervals: normal ST/T Wave abnormalities: Non-specific ST segment / T-wave changes, but no evidence of acute ischemia. Conduction Disturbances: none Narrative Interpretation: motion artifact present, but no indication for acute ischemia  ____________________________________________  RADIOLOGY   Dg Chest 2 View  Result Date: 01/01/2017 CLINICAL DATA:  Chest pain and shortness of breath EXAM: CHEST  2 VIEW COMPARISON:  August 23, 2016 FINDINGS: There is scarring in the right mid and lower lung zones. Lungs elsewhere are clear. Heart size and pulmonary vascularity are normal. No adenopathy. There is aortic atherosclerosis. No bone lesions. IMPRESSION: Scarring right mid and lower lung zones. No edema or consolidation. There is aortic atherosclerosis. Electronically Signed   By: Bretta Bang III M.D.   On: 01/01/2017 11:55     ____________________________________________   PROCEDURES  Critical Care performed: No   Procedure(s) performed:   Procedures   ____________________________________________   INITIAL IMPRESSION / ASSESSMENT AND PLAN / ED COURSE  Pertinent labs & imaging results that were available during my care of the patient were reviewed by me and considered in my medical decision making (see chart for details).  The patient is actually well appearing for her baseline and is in no acute distress with no intercostal retractions and no accessory muscle usage.  She has severe end-stage COPD and does have occasional exacerbations.  Her last one was about 5 months ago and I admitted her at that time.  However at this time her vital signs are reassuring and she is in no distress.  I discussed with her and her daughter the plan and they agree with the plan for 3 DuoNeb labs and some prednisone and reassessment.  At that point I believe she will likely be able to go home.  She does not require BiPAP at this time.  She has had extensive cardiac workups and multiple troponin "rule out" in the past.  I will obtain a second troponin as well but I have no suspicion for ACS and she always has chest discomfort when she is having a COPD exacerbation.   Clinical Course as of Jan 02 1643  Mon Jan 01, 2017  1352 No acute findings on CXR. DG Chest 2 View [CF]  [id number] The patient states she feels much better after the breathing treatments.  Upon auscultation she no longer has any expiratory wheezing.  I will give her prescriptions for her inhaler, nebulizer solution, and prednisone.  I instructed her to follow up as soon as possible as an outpatient.  I gave my usual and customary return precautions.     [CF]    Clinical Course User Index [CF] Loleta Rose, MD    ____________________________________________  FINAL CLINICAL IMPRESSION(S) / ED DIAGNOSES  Final diagnoses:  COPD exacerbation (HCC)      MEDICATIONS GIVEN DURING THIS VISIT:  Medications  ipratropium-albuterol (DUONEB) 0.5-2.5 (3) MG/3ML nebulizer solution 3 mL (3 mLs Nebulization Given 01/01/17 1444)  ipratropium-albuterol (DUONEB) 0.5-2.5 (3) MG/3ML nebulizer solution 3 mL (3 mLs Nebulization Given 01/01/17 1444)  ipratropium-albuterol (DUONEB) 0.5-2.5 (3) MG/3ML nebulizer solution 3 mL (3 mLs Nebulization Given 01/01/17 1444)  predniSONE (DELTASONE) tablet 60 mg (60 mg Oral Given  01/01/17 1444)     NEW OUTPATIENT MEDICATIONS STARTED DURING THIS VISIT:  New Prescriptions   ALBUTEROL (PROVENTIL HFA;VENTOLIN HFA) 108 (90 BASE) MCG/ACT INHALER    Inhale 2-4 puffs by mouth every 4 hours as needed for wheezing, cough, and/or shortness of breath   PREDNISONE (DELTASONE) 20 MG TABLET    Take 2 tablets (40 mg total) by mouth daily.    Modified Medications   Modified Medication Previous Medication   ALBUTEROL (ACCUNEB) 0.63 MG/3ML NEBULIZER SOLUTION albuterol (ACCUNEB) 0.63 MG/3ML nebulizer solution      Take 3 mLs by nebulization every 4 (four) hours as needed.    Inhale 3 mLs into the lungs 3 (three) times daily as needed.     Discontinued Medications   ALBUTEROL (PROVENTIL HFA;VENTOLIN HFA) 108 (90 BASE) MCG/ACT INHALER    Inhale 2 puffs into the lungs every 4 (four) hours as needed.   PREDNISONE (STERAPRED UNI-PAK 21 TAB) 10 MG (21) TBPK TABLET    Take 1 tablet (10 mg total) by mouth daily. Start 60 mg once daily, taper 10 mg daily until done     Note:  This document was prepared using Dragon voice recognition software and may include unintentional dictation errors.    Loleta Rose, MD 01/01/17 [phone]

## 2017-01-01 NOTE — Discharge Instructions (Signed)
We believe that your symptoms are caused today by an exacerbation of your COPD.  Please take the prescribed medications and any medications that you have at home for your COPD.  Follow up with your doctor as recommended.  If you develop any new or worsening symptoms, including but not limited to fever, persistent vomiting, worsening shortness of breath, or other symptoms that concern you, please return to the Emergency Department immediately.  

## 2017-06-09 ENCOUNTER — Inpatient Hospital Stay
Admission: EM | Admit: 2017-06-09 | Discharge: 2017-06-15 | DRG: 190 | Disposition: A | Discharge status: Home / Self Care | Payer: Medicare HMO | Attending: Internal Medicine | Admitting: Internal Medicine

## 2017-06-09 ENCOUNTER — Emergency Department: Payer: Medicare HMO

## 2017-06-09 DIAGNOSIS — Z79899 Other long term (current) drug therapy: Secondary | ICD-10-CM

## 2017-06-09 DIAGNOSIS — R0603 Acute respiratory distress: Secondary | ICD-10-CM | POA: Diagnosis present

## 2017-06-09 DIAGNOSIS — Z888 Allergy status to other drugs, medicaments and biological substances status: Secondary | ICD-10-CM | POA: Diagnosis not present

## 2017-06-09 DIAGNOSIS — R739 Hyperglycemia, unspecified: Secondary | ICD-10-CM | POA: Diagnosis present

## 2017-06-09 DIAGNOSIS — Z885 Allergy status to narcotic agent status: Secondary | ICD-10-CM

## 2017-06-09 DIAGNOSIS — Z66 Do not resuscitate: Secondary | ICD-10-CM | POA: Diagnosis present

## 2017-06-09 DIAGNOSIS — J9621 Acute and chronic respiratory failure with hypoxia: Secondary | ICD-10-CM | POA: Diagnosis present

## 2017-06-09 DIAGNOSIS — Z9981 Dependence on supplemental oxygen: Secondary | ICD-10-CM | POA: Diagnosis not present

## 2017-06-09 DIAGNOSIS — T380X5A Adverse effect of glucocorticoids and synthetic analogues, initial encounter: Secondary | ICD-10-CM | POA: Diagnosis present

## 2017-06-09 DIAGNOSIS — I10 Essential (primary) hypertension: Secondary | ICD-10-CM | POA: Diagnosis present

## 2017-06-09 DIAGNOSIS — Z87891 Personal history of nicotine dependence: Secondary | ICD-10-CM

## 2017-06-09 DIAGNOSIS — J9601 Acute respiratory failure with hypoxia: Secondary | ICD-10-CM | POA: Diagnosis present

## 2017-06-09 DIAGNOSIS — Z7951 Long term (current) use of inhaled steroids: Secondary | ICD-10-CM

## 2017-06-09 DIAGNOSIS — E785 Hyperlipidemia, unspecified: Secondary | ICD-10-CM | POA: Diagnosis present

## 2017-06-09 DIAGNOSIS — J441 Chronic obstructive pulmonary disease with (acute) exacerbation: Principal | ICD-10-CM | POA: Diagnosis present

## 2017-06-09 LAB — BASIC METABOLIC PANEL
Anion gap: 8 (ref 5–15)
BUN: 21 mg/dL — AB (ref 6–20)
CHLORIDE: 104 mmol/L (ref 101–111)
CO2: 27 mmol/L (ref 22–32)
Calcium: 9.4 mg/dL (ref 8.9–10.3)
Creatinine, Ser: 0.92 mg/dL (ref 0.44–1.00)
GFR calc Af Amer: >60 mL/min (ref >60)
GFR calc non Af Amer: >60 mL/min (ref >60)
GLUCOSE: 112 mg/dL — AB (ref 65–99)
POTASSIUM: 3.8 mmol/L (ref 3.5–5.1)
Sodium: 139 mmol/L (ref 135–145)

## 2017-06-09 LAB — CBC
HEMATOCRIT: 37.5 % (ref 35.0–47.0)
Hemoglobin: 12.6 g/dL (ref 12.0–16.0)
MCH: 29.4 pg (ref 26.0–34.0)
MCHC: 33.5 g/dL (ref 32.0–36.0)
MCV: 87.8 fL (ref 80.0–100.0)
Platelets: 246 10*3/uL (ref 150–440)
RBC: 4.27 MIL/uL (ref 3.80–5.20)
RDW: 14.7 % — AB (ref 11.5–14.5)
WBC: 9.1 10*3/uL (ref 3.6–11.0)

## 2017-06-09 LAB — PROTIME-INR
INR: 0.93
Prothrombin Time: 12.4 seconds (ref 11.4–15.2)

## 2017-06-09 LAB — HEPATIC FUNCTION PANEL
ALBUMIN: 3.8 g/dL (ref 3.5–5.0)
ALK PHOS: 54 U/L (ref 38–126)
ALT: 41 U/L (ref 14–54)
AST: 49 U/L — AB (ref 15–41)
BILIRUBIN TOTAL: 0.5 mg/dL (ref 0.3–1.2)
Bilirubin, Direct: <0.1 mg/dL — ABNORMAL LOW (ref 0.1–0.5)
Total Protein: 6.9 g/dL (ref 6.5–8.1)

## 2017-06-09 LAB — URINALYSIS, COMPLETE (UACMP) WITH MICROSCOPIC
BACTERIA UA: NONE SEEN
BILIRUBIN URINE: NEGATIVE
Glucose, UA: NEGATIVE mg/dL
KETONES UR: NEGATIVE mg/dL
LEUKOCYTES UA: NEGATIVE
Nitrite: NEGATIVE
PH: 5 (ref 5.0–8.0)
PROTEIN: NEGATIVE mg/dL
Specific Gravity, Urine: 1.005 (ref 1.005–1.030)

## 2017-06-09 LAB — BRAIN NATRIURETIC PEPTIDE: B Natriuretic Peptide: 72 pg/mL (ref 0.0–100.0)

## 2017-06-09 LAB — TROPONIN I: Troponin I: <0.03 ng/mL (ref <0.03)

## 2017-06-09 MED ORDER — ONDANSETRON HCL 4 MG PO TABS: 4.0000 mg | ORAL_TABLET | Freq: Four times a day (QID) | ORAL | Status: DC | PRN

## 2017-06-09 MED ORDER — ONDANSETRON HCL 4 MG/2ML IJ SOLN
INTRAMUSCULAR | Status: AC
  Administered 2017-06-09: 4 mg via INTRAVENOUS
  Filled 2017-06-09: qty 2

## 2017-06-09 MED ORDER — METHYLPREDNISOLONE SODIUM SUCC 125 MG IJ SOLR
60.0000 mg | Freq: Four times a day (QID) | INTRAMUSCULAR | Status: DC
  Administered 2017-06-10 – 2017-06-14 (×17): 60 mg via INTRAVENOUS
  Filled 2017-06-09 (×19): qty 2

## 2017-06-09 MED ORDER — ONDANSETRON HCL 4 MG/2ML IJ SOLN
4.0000 mg | Freq: Four times a day (QID) | INTRAMUSCULAR | Status: DC | PRN
  Administered 2017-06-09: 4 mg via INTRAVENOUS

## 2017-06-09 NOTE — ED Notes (Signed)
Called pharmacy to have solumedrol rescheduled

## 2017-06-09 NOTE — ED Triage Notes (Signed)
Per EMS: Patient coming from home. Patient has hx of COPD and is at 3L [place] of O2 at baseline. Patient c/o SOB and has bilateral expiratory wheezes. Patient given 2 albuterol neb treatments, 1 duoneb treatment, 125 mg solumedrol.  Patient reports she is out of albuterol for her nebulizer having taken the last dose today. Pt normally takes 1-2 neb treatments daily.  Patient c/o central chest tightness.

## 2017-06-09 NOTE — ED Notes (Signed)
Patient requesting to use the toilet. Pt refusing to let this RN push bed to toilet with oxygen on. Patient removed oxygen tubing, walked to toilet with RN assistance. Patient work of breathing and wheezing increased. RN ambulated patient back to bed.  Patient's oxygen saturation decreased down to 84%.  Patient placed on 5L [place]. Patient's oxygen saturation increased to 98%, patient's work of breathing lessened. Patient's oxygen decreased to 4L [place]. Patient's oxygen saturation level currently at 95% on 4L. Patient reports increased comfort of breathing. MD McShane aware. RN will continue to monitor.  RN paged respiratory to place patient on Bipap per [facility] order.

## 2017-06-09 NOTE — ED Notes (Signed)
RN to bedside. Patient c/o nausea. Zofran ordered and administered

## 2017-06-09 NOTE — ED Provider Notes (Addendum)
[facility] Emergency Department Provider Note  ____________________________________________   I have reviewed the triage vital signs and the nursing notes.   HISTORY  Chief Complaint Shortness of Breath    HPI Audrey Sexton is a 66 y.o. female with a history of COPD DO NOT RESUSCITATE, on to 3 L home oxygen as had increasing wheeze over the course of the day, feels some degree of tightness in her chest but no chest pain. States that she feels a little better after albuterol from EMS. Denies productive cough or fever. Nothing makes better nothing makes worse, history is somewhat limited by patient being on breathing treatment and the acuity of her respiratory complaint. She does state that she has a fluid pill that she takes she denies history of CHF but she is complaining of some degree of increased bilateral lower extremity swelling over the last indeterminate period of time. Last note from this hospital suggests a long history of lower extremity edema but I do not see an echo   Past Medical History:  Diagnosis Date  . Asthma   . Carpal tunnel syndrome of left wrist   . COPD (chronic obstructive pulmonary disease) (HCC)   . Hyperlipemia   . Hypertension     Patient Active Problem List   Diagnosis Date Noted  . Acute respiratory distress   . DNR (do not resuscitate) discussion 08/23/2016  . Palliative care by specialist 08/23/2016  . Dyspnea 08/23/2016  . COPD exacerbation (HCC) 08/18/2016    Past Surgical History:  Procedure Laterality Date  . CARPAL TUNNEL RELEASE Left   . fractured tibia Left    repair  . ROTATOR CUFF REPAIR Left   . TUBAL LIGATION      Prior to Admission medications   Medication Sig Start Date End Date Taking? Authorizing Provider  Acetaminophen 500 MG coapsule Take 500 mg by mouth as needed.    [provider]  albuterol (ACCUNEB) 0.63 MG/3ML nebulizer solution Take 3 mLs by nebulization every 4 (four) hours as  needed. 01/01/17   Loleta Rose, MD  albuterol (PROVENTIL HFA;VENTOLIN HFA) 108 (90 Base) MCG/ACT inhaler Inhale 2-4 puffs by mouth every 4 hours as needed for wheezing, cough, and/or shortness of breath 01/01/17   Loleta Rose, MD  Ascorbic Acid (VITAMIN C) 1000 MG tablet Take 1,000 mg by mouth daily.    [provider]  atorvastatin (LIPITOR) 40 MG tablet Take 40 mg by mouth daily.    [provider]  benzonatate (TESSALON) 200 MG capsule Take 1 capsule (200 mg total) by mouth 3 (three) times daily as needed for cough. 08/21/16   Delfino Lovett, MD  Cholecalciferol (VITAMIN D-1000 MAX ST) 1000 units tablet Take 1,000 Units by mouth daily.    [provider]  diltiazem (CARDIZEM CD) 240 MG 24 hr capsule Take 240 mg by mouth daily.    [provider]  fluticasone furoate-vilanterol (BREO ELLIPTA) 100-25 MCG/INH AEPB Inhale 1 puff into the lungs daily.    [provider]  furosemide (LASIX) 40 MG tablet Take 40 mg by mouth daily.    [provider]  Multiple Vitamin (MULTI-VITAMIN DAILY PO) Take 1 tablet by mouth daily.    [provider]  omeprazole (PRILOSEC) 20 MG capsule Take 20 mg by mouth daily.    [provider]  oxybutynin (DITROPAN) 5 MG tablet Take 5 mg by mouth daily.    [provider]  PARoxetine (PAXIL) 20 MG tablet Take 20 mg  by mouth daily.    [provider]  tiotropium (SPIRIVA) 18 MCG inhalation capsule Place 1 capsule into inhaler and inhale daily.    [provider]  vitamin B-12 (CYANOCOBALAMIN) 1000 MCG tablet Take 2,000 mcg by mouth daily.    [provider]    Allergies Percocet [oxycodone-acetaminophen]; Vicodin [hydrocodone-acetaminophen]; and Penicillins  Family History  Problem Relation Age of Onset  . Heart disease Mother        died at 78  . Diabetes Mother   . Alzheimer's disease Father        died at 52  . Parkinson's disease Sister   . Heart disease  Brother        died at 57  . Diabetes Brother   . Kidney disease Sister   . Breast cancer Sister     Social History Social History  Substance Use Topics  . Smoking status: Former Smoker    Types: Cigarettes  . Smokeless tobacco: Never Used  . Alcohol use No    Review of Systems Constitutional: No fever/chills Eyes: No visual changes. ENT: No sore throat. No stiff neck no neck pain Cardiovascular: Denies chest pain. Respiratory: + shortness of breath. Gastrointestinal:   no vomiting.  No diarrhea.  No constipation. Genitourinary: Negative for dysuria. Musculoskeletal: Negative lower extremity swelling Skin: Negative for rash. Neurological: Negative for severe headaches, focal weakness or numbness.   ____________________________________________   PHYSICAL EXAM:  VITAL SIGNS: ED Triage Vitals  Enc Vitals Group     BP --      Pulse --      Resp --      Temp --      Temp src --      SpO2 06/09/17 1922 98 %     Weight 06/09/17 1924 210 lb (95.3 kg)     Height --      Head Circumference --      Peak Flow --      Pain Score --      Pain Loc --      Pain Edu? --      Excl. in GC? --     Constitutional: Alert and oriented.  Eyes: Conjunctivae are normal Head: Atraumatic HEENT: No congestion/rhinnorhea. Mucous membranes are moist.  Oropharynx non-erythematous Neck:   Nontender with no meningismus, no masses, no stridor Cardiovascular: Normal rate, regular rhythm. Grossly normal heart sounds.  Good peripheral circulation. Respiratory: Normal respiratory effort.  she was somewhat decreased breath sounds in the bases no rales or rhonchi, mild wheeze appreciated Abdominal: Soft and nontender. No distention. No guarding no rebound Back:  There is no focal tenderness or step off.  there is no midline tenderness there are no lesions noted. there is no CVA tenderness Musculoskeletal: No lower extremity tenderness, no upper extremity tenderness. No joint effusions, no DVT  signs strong distal pulses chronic appearing symmetric bilateral 2+ edema Neurologic:  Normal speech and language. No gross focal neurologic deficits are appreciated.  Skin:  Skin is warm, dry and intact. No rash noted. Psychiatric: Mood and affect are normal. Speech and behavior are normal.  ____________________________________________   LABS (all labs ordered are listed, but only abnormal results are displayed)  Labs Reviewed  BASIC METABOLIC PANEL  CBC  TROPONIN I  BRAIN NATRIURETIC PEPTIDE  HEPATIC FUNCTION PANEL  PROTIME-INR  URINALYSIS, COMPLETE (UACMP) WITH MICROSCOPIC    Pertinent labs  results that were available during my care of the patient were reviewed by me and  considered in my medical decision making (see chart for details). ____________________________________________  EKG  I personally interpreted any EKGs ordered by me or triage sinus tachycardia rate 101 no acute ST elevation or depression normal axis otherwise unremarkable EKG ____________________________________________  RADIOLOGY  Pertinent labs & imaging results that were available during my care of the patient were reviewed by me and considered in my medical decision making (see chart for details). If possible, patient and/or family made aware of any abnormal findings. ____________________________________________    PROCEDURES  Procedure(s) performed: None  Procedures  Critical Care performed: CRITICAL CARE Performed by: Jeanmarie Plant   Total critical care time: 45 minutes  Critical care time was exclusive of separately billable procedures and treating other patients.  Critical care was necessary to treat or prevent imminent or life-threatening deterioration.  Critical care was time spent personally by me on the following activities: development of treatment plan with patient and/or surrogate as well as nursing, discussions with consultants, evaluation of patient's response to treatment,  examination of patient, obtaining history from patient or surrogate, ordering and performing treatments and interventions, ordering and review of laboratory studies, ordering and review of radiographic studies, pulse oximetry and re-evaluation of patient's condition.   ____________________________________________   INITIAL IMPRESSION / ASSESSMENT AND PLAN / ED COURSE  Pertinent labs & imaging results that were available during my care of the patient were reviewed by me and considered in my medical decision making (see chart for details).  patient with COPD on 3 L home oxygen year with respiratory compromise, differential includes PE ACS CHF and COPD. Favor COPD low suspicion for PE, we'll check a troponin and a BNP to evaluate for ischemia and heart failure, chest x-ray is pending blood work is pending, patient is more comfortable after continuous nebs for EMS. We'll evaluate for the need for BiPAP.  ----------------------------------------- 8:41 PM on 06/09/2017 -----------------------------------------  Patient insisted on going off oxygen to the bathroom. She was walked in the room a few feet to the bathroom and desatted down to the low 80s, became very taken today again, we've are placed her on BiPAP sets of come back up however after she's back on oxygen. Patient is a DO NOT RESUSCITATE, we do not have a lot of room here, we will continue with BiPAP and patient will be admitted. No indication at this time for antibiotics, no productive cough, no fever, at home, we will get a temperature here, andchest x-ray reassuring white count reassuring etc. BNP is normal     ____________________________________________   FINAL CLINICAL IMPRESSION(S) / ED DIAGNOSES  Final diagnoses:  None      This chart was dictated using voice recognition software.  Despite best efforts to proofread,  errors can occur which can change meaning.      Jeanmarie Plant, MD 06/09/17 1944    Jeanmarie Plant, MD 06/09/17 2042    Jeanmarie Plant, MD 06/09/17 2043

## 2017-06-09 NOTE — H&P (Addendum)
History and Physical   [facility] - [place] @ [facility] Admission History and Physical [employer], D.O.    Patient Name: Audrey Sexton MR#: [id number] Date of Birth: 1950-12-26 Date of Admission: 06/09/2017  Referring MD/NP/PA: Dr. Alphonzo Lemmings Primary Care Physician: Oswaldo Conroy, MD  Chief Complaint:     Chief Complaint  Patient presents with  . Shortness of Breath    HPI: Audrey Sexton is a 66 y.o. female with a known history of asthma, home O2 dependent COPD on 3L, HTN, HLD presents to the emergency department for evaluation of SOB.  Patient was in a usual state of health until about 3 days [employer] progressively worsening wheezing, chest tightness and difficulty breathing. She did increase her home O2 with no significant improvement..shortness of breath is associated with a nonproductive cough.  Patient denies fevers/chills, weakness, dizziness, chest pain,  N/V/C/D, abdominal pain, dysuria/frequency, changes in mental status.    Otherwise there has been no change in status. Patient has been taking medication as prescribed and there has been no recent change in medication or diet.  No recent antibiotics.  There has been no recent illness, hospitalizations, travel or sick contacts.    EMS/ED Course:  Medical admission has been requested for further management of acute respiratory failure with hypoxia.  Review of Systems:  CONSTITUTIONAL: Nofever/chills, fatigue, weakness, weight gain/loss, headache. EYES: No blurry or double vision. ENT: No tinnitus, postnasal drip, redness or soreness of the oropharynx. RESPIRATORY: Positive cough, dyspnea, wheeze.  No hemoptysis.  CARDIOVASCULAR: No chest pain, palpitations, syncope, orthopnea. No lower extremity edema.  GASTROINTESTINAL: No nausea, vomiting, abdominal pain, diarrhea, constipation.  No hematemesis, melena or hematochezia. GENITOURINARY: No dysuria, frequency, hematuria. ENDOCRINE: No polyuria or  nocturia. No heat or cold intolerance. HEMATOLOGY: No anemia, bruising, bleeding. INTEGUMENTARY: No rashes, ulcers, lesions. MUSCULOSKELETAL: No arthritis, gout, dyspnea. NEUROLOGIC: No numbness, tingling, ataxia, seizure-type activity, weakness. PSYCHIATRIC: No anxiety, depression, insomnia.       Past Medical History:  Diagnosis Date  . Asthma   . Carpal tunnel syndrome of left wrist   . COPD (chronic obstructive pulmonary disease) (HCC)   . Hyperlipemia   . Hypertension          Past Surgical History:  Procedure Laterality Date  . CARPAL TUNNEL RELEASE Left   . fractured tibia Left    repair  . ROTATOR CUFF REPAIR Left   . TUBAL LIGATION       reports that she has quit smoking. Her smoking use included Cigarettes. She has never used smokeless tobacco. She reports that she does not drink alcohol. Her drug history is not on file.       Allergies  Allergen Reactions  . Percocet [Oxycodone-Acetaminophen] Nausea And Vomiting  . Vicodin [Hydrocodone-Acetaminophen] Nausea And Vomiting  . Penicillins Hives    Has patient had a PCN reaction causing immediate rash, facial/tongue/throat swelling, SOB or lightheadedness with hypotension: no Has patient had a PCN reaction causing severe rash involving mucus membranes or skin necrosis: no Has patient had a PCN reaction that required hospitalization  no Has patient had a PCN reaction occurring within the last 10 years: no If all of the above answers are "NO", then may proceed with Cephalosporin use.     Family History  Problem Relation Age of Onset  . Heart disease Mother        died at 92  . Diabetes Mother   . Alzheimer's disease Father        died at  63  . Parkinson's disease Sister   . Heart disease Brother        died at 65  . Diabetes Brother   . Kidney disease Sister   . Breast cancer Sister            Prior to Admission medications   Medication Sig Start Date End Date  Taking? Authorizing Provider  Acetaminophen 500 MG coapsule Take 500 mg by mouth as needed.    [provider]  albuterol (ACCUNEB) 0.63 MG/3ML nebulizer solution Take 3 mLs by nebulization every 4 (four) hours as needed. 01/01/17   Loleta Rose, MD  albuterol (PROVENTIL HFA;VENTOLIN HFA) 108 (90 Base) MCG/ACT inhaler Inhale 2-4 puffs by mouth every 4 hours as needed for wheezing, cough, and/or shortness of breath 01/01/17   Loleta Rose, MD  Ascorbic Acid (VITAMIN C) 1000 MG tablet Take 1,000 mg by mouth daily.    [provider]  atorvastatin (LIPITOR) 40 MG tablet Take 40 mg by mouth daily.    [provider]  benzonatate (TESSALON) 200 MG capsule Take 1 capsule (200 mg total) by mouth 3 (three) times daily as needed for cough. 08/21/16   Delfino Lovett, MD  Cholecalciferol (VITAMIN D-1000 MAX ST) 1000 units tablet Take 1,000 Units by mouth daily.    [provider]  diltiazem (CARDIZEM CD) 240 MG 24 hr capsule Take 240 mg by mouth daily.    [provider]  fluticasone furoate-vilanterol (BREO ELLIPTA) 100-25 MCG/INH AEPB Inhale 1 puff into the lungs daily.    [provider]  furosemide (LASIX) 40 MG tablet Take 40 mg by mouth daily.    [provider]  Multiple Vitamin (MULTI-VITAMIN DAILY PO) Take 1 tablet by mouth daily.    [provider]  omeprazole (PRILOSEC) 20 MG capsule Take 20 mg by mouth daily.    [provider]  oxybutynin (DITROPAN) 5 MG tablet Take 5 mg by mouth daily.    [provider]  PARoxetine (PAXIL) 20 MG tablet Take 20 mg by mouth daily.    [provider]  tiotropium (SPIRIVA) 18 MCG inhalation capsule Place 1 capsule into inhaler and inhale daily.    [provider]  vitamin B-12 (CYANOCOBALAMIN) 1000 MCG tablet Take 2,000 mcg by mouth daily.    [provider]    Physical Exam:       Vitals:   06/09/17  1924 06/09/17 1930 06/09/17 2000 06/09/17 2036  BP:  (!) 172/72 (!) 159/72 (!) 156/85  Pulse:  98 (!) 102 99  Resp:  15 19 (!) 21  SpO2:  98% 94% 95%  Weight: 95.3 kg (210 lb)       GENERAL: 66 y.o.-year-old female patient, well-developed, well-nourished lying in the bed in no acute distress.  Pleasant and cooperative.   HEENT: Head atraumatic, normocephalic. Pupils equal. Mucus membranes moist. NECK: Supple, full range of motion. No JVD, no bruit heard. No thyroid enlargement, no tenderness, no cervical lymphadenopathy. CHEST: Diminished breath sounds, decreased air movement and expiratory wheezing throughout CARDIOVASCULAR: S1, S2 normal. No murmurs, rubs, or gallops. Cap refill <2 seconds. Pulses intact distally.  ABDOMEN: Soft, nondistended, nontender. No rebound, guarding, rigidity. Normoactive bowel sounds present in all four quadrants.  EXTREMITIES: Positive bilateral pedal edema, No calf tenderness or Homan's sign.  NEUROLOGIC: The patient is alert and oriented x 3. Cranial nerves II through XII are grossly intact with no focal sensorimotor deficit. PSYCHIATRIC:  Normal affect, mood, thought content. SKIN: Warm,  dry, and intact without obvious rash, lesion, or ulcer.    Labs on Admission:  CBC:  Last Labs    Recent Labs Lab 06/09/17 1931  WBC 9.1  HGB 12.6  HCT 37.5  MCV 87.8  PLT 246     Basic Metabolic Panel:  Last Labs    Recent Labs Lab 06/09/17 1931  NA 139  K 3.8  CL 104  CO2 27  GLUCOSE 112*  BUN 21*  CREATININE 0.92  CALCIUM 9.4     GFR: Estimated Creatinine Clearance: 66.4 mL/min (by C-G formula based on SCr of 0.92 mg/dL). Liver Function Tests:  Last Labs    Recent Labs Lab 06/09/17 1931  AST 49*  ALT 41  ALKPHOS 54  BILITOT 0.5  PROT 6.9  ALBUMIN 3.8     Last Labs   No results for input(s): LIPASE, AMYLASE in the last 168 hours.   Last Labs   No results for input(s): AMMONIA in the last 168 hours.    Coagulation Profile:  Last Labs    Recent Labs Lab 06/09/17 1931  INR 0.93     Cardiac Enzymes:  Last Labs    Recent Labs Lab 06/09/17 1931  TROPONINI <0.03     BNP (last 3 results) Recent Labs (within last 365 days)  No results for input(s): PROBNP in the last 8760 hours.   HbA1C: Recent Labs (last 2 labs)   No results for input(s): HGBA1C in the last 72 hours.   CBG: Last Labs   No results for input(s): GLUCAP in the last 168 hours.   Lipid Profile: Recent Labs (last 2 labs)   No results for input(s): CHOL, HDL, LDLCALC, TRIG, CHOLHDL, LDLDIRECT in the last 72 hours.   Thyroid Function Tests: Recent Labs (last 2 labs)   No results for input(s): TSH, T4TOTAL, FREET4, T3FREE, THYROIDAB in the last 72 hours.   Anemia Panel: Recent Labs (last 2 labs)   No results for input(s): VITAMINB12, FOLATE, FERRITIN, TIBC, IRON, RETICCTPCT in the last 72 hours.   Urine analysis: Labs (Brief)          Component Value Date/Time   COLORURINE STRAW (A) 06/09/2017 1931   APPEARANCEUR CLEAR (A) 06/09/2017 1931   APPEARANCEUR Clear 06/16/2014 2108   LABSPEC 1.005 06/09/2017 1931   LABSPEC 1.003 06/16/2014 2108   PHURINE 5.0 06/09/2017 1931   GLUCOSEU NEGATIVE 06/09/2017 1931   GLUCOSEU Negative 06/16/2014 2108   HGBUR SMALL (A) 06/09/2017 1931   BILIRUBINUR NEGATIVE 06/09/2017 1931   BILIRUBINUR Negative 06/16/2014 2108   KETONESUR NEGATIVE 06/09/2017 1931   PROTEINUR NEGATIVE 06/09/2017 1931   NITRITE NEGATIVE 06/09/2017 1931   LEUKOCYTESUR NEGATIVE 06/09/2017 1931   LEUKOCYTESUR Negative 06/16/2014 2108     Sepsis Labs: (procalcitonin:4,lacticidven:4) )No results found for this or any previous visit (from the past 240 hour(s)).   Radiological Exams on Admission:  Imaging Results (Last 48 hours)  Dg Chest Portable 1 View  Result Date: 06/09/2017 CLINICAL DATA:  66 y/o F; shortness of breath and bilateral expiratory  wheezes. Mid chest pain. History of COPD. EXAM: PORTABLE CHEST 1 VIEW COMPARISON:  01/01/2017 chest radiograph.  08/17/2016 CT chest. FINDINGS: Stable cardiac silhouette within normal limits. Aortic atherosclerosis with calcification. Stable right lung base reticular markings compatible with scarring. Emphysema. No focal consolidation. No pleural effusion or pneumothorax. Bones are unremarkable. IMPRESSION: Stable findings of COPD and right lung base scarring. No focal consolidation. Electronically Signed   By: Buzzy Han.D.  On: 06/09/2017 20:05     EKG: Sinus tachycardia at 101 bpm with normal axis and nonspecific ST-T wave changes.   Assessment/Plan  This is a 66 y.o. 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 Acute respiratory failure 2/2 exacerbation of COPD - Admit telemetry - Weaned off BiPAP to 4L O2 [place] and maintaining sats - IV steroids and azithromycin - Nebulizers, O2 therapy and expectorants as needed.  - Continuous pulse oximetry - Continue Breo and Spiriva  #. History of HTN - Continue diltiazem  #. History of HLD - Continue Lipitor  Admission status: Inpatient IV Fluids: NS Diet/Nutrition: NPO Consults called: CCM  DVT Px: Lovenox, SCDs and early ambulation. Code Status: Full Code  Disposition Plan: To home in 2-3 days  All the records are reviewed and case discussed with ED provider. Management plans discussed with the patient and/or family who express understanding and agree with plan of care.  Kj Imbert D.O. on 06/09/2017 at 9:02 PM Between 7am to 6pm - Pager - [phone] After 6pm go to [email or web address] - Social research officer, government [facility] [place] [facility] Office [phone] CC: Primary care physician; Oswaldo Conroy, MD   06/09/2017, 9:02 PM

## 2017-06-09 NOTE — ED Notes (Addendum)
Per Dr Emmit Pomfret and consultation with RT, pt weened to 4lpm [place]

## 2017-06-09 NOTE — ED Notes (Signed)
Respiratory therapist at bedside.

## 2017-06-10 LAB — CBC
HCT: 39 % (ref 35.0–47.0)
HEMOGLOBIN: 12.9 g/dL (ref 12.0–16.0)
MCH: 29 pg (ref 26.0–34.0)
MCHC: 32.9 g/dL (ref 32.0–36.0)
MCV: 88.2 fL (ref 80.0–100.0)
PLATELETS: 234 10*3/uL (ref 150–440)
RBC: 4.43 MIL/uL (ref 3.80–5.20)
RDW: 15.4 % — ABNORMAL HIGH (ref 11.5–14.5)
WBC: 10.1 10*3/uL (ref 3.6–11.0)

## 2017-06-10 LAB — BASIC METABOLIC PANEL
ANION GAP: 9 (ref 5–15)
BUN: 20 mg/dL (ref 6–20)
CALCIUM: 9.4 mg/dL (ref 8.9–10.3)
CO2: 27 mmol/L (ref 22–32)
CREATININE: 0.99 mg/dL (ref 0.44–1.00)
Chloride: 106 mmol/L (ref 101–111)
GFR, EST NON AFRICAN AMERICAN: 58 mL/min — AB (ref >60)
Glucose, Bld: 226 mg/dL — ABNORMAL HIGH (ref 65–99)
Potassium: 3.9 mmol/L (ref 3.5–5.1)
SODIUM: 142 mmol/L (ref 135–145)

## 2017-06-10 MED ORDER — OXYBUTYNIN CHLORIDE 5 MG PO TABS
5.0000 mg | ORAL_TABLET | Freq: Every day | ORAL | Status: DC
  Administered 2017-06-10 – 2017-06-15 (×6): 5 mg via ORAL
  Filled 2017-06-10 (×6): qty 1

## 2017-06-10 MED ORDER — VITAMIN B-12 1000 MCG PO TABS
2000.0000 ug | ORAL_TABLET | Freq: Every day | ORAL | Status: DC
  Administered 2017-06-10 – 2017-06-15 (×6): 2000 ug via ORAL
  Filled 2017-06-10 (×6): qty 2

## 2017-06-10 MED ORDER — FUROSEMIDE 40 MG PO TABS
40.0000 mg | ORAL_TABLET | Freq: Every day | ORAL | Status: DC
Start: 2017-06-10 — End: 2017-06-15
  Administered 2017-06-10 – 2017-06-15 (×6): 40 mg via ORAL
  Filled 2017-06-10 (×6): qty 1

## 2017-06-10 MED ORDER — MAGNESIUM CITRATE PO SOLN
1.0000 | Freq: Once | ORAL | Status: DC | PRN
  Filled 2017-06-10: qty 296

## 2017-06-10 MED ORDER — HYDRALAZINE HCL 20 MG/ML IJ SOLN: 10.0000 mg | Freq: Four times a day (QID) | INTRAMUSCULAR | Status: DC | PRN

## 2017-06-10 MED ORDER — ATORVASTATIN CALCIUM 20 MG PO TABS
40.0000 mg | ORAL_TABLET | Freq: Every day | ORAL | Status: DC
  Administered 2017-06-10 – 2017-06-14 (×5): 40 mg via ORAL
  Filled 2017-06-10 (×5): qty 2

## 2017-06-10 MED ORDER — VITAMIN C 500 MG PO TABS
1000.0000 mg | ORAL_TABLET | Freq: Every day | ORAL | Status: DC
  Administered 2017-06-10 – 2017-06-15 (×6): 1000 mg via ORAL
  Filled 2017-06-10 (×6): qty 2

## 2017-06-10 MED ORDER — SODIUM CHLORIDE 0.9% FLUSH
3.0000 mL | Freq: Two times a day (BID) | INTRAVENOUS | Status: DC
  Administered 2017-06-10 – 2017-06-15 (×14): 3 mL via INTRAVENOUS

## 2017-06-10 MED ORDER — SENNOSIDES-DOCUSATE SODIUM 8.6-50 MG PO TABS: 1.0000 | ORAL_TABLET | Freq: Every evening | ORAL | Status: DC | PRN

## 2017-06-10 MED ORDER — BENZONATATE 100 MG PO CAPS: 200.0000 mg | ORAL_CAPSULE | Freq: Three times a day (TID) | ORAL | Status: DC | PRN

## 2017-06-10 MED ORDER — FLUTICASONE FUROATE-VILANTEROL 100-25 MCG/INH IN AEPB
1.0000 | INHALATION_SPRAY | Freq: Every day | RESPIRATORY_TRACT | Status: DC
  Administered 2017-06-10 – 2017-06-15 (×6): 1 via RESPIRATORY_TRACT
  Filled 2017-06-10: qty 28

## 2017-06-10 MED ORDER — VITAMIN D 1000 UNITS PO TABS
1000.0000 [IU] | ORAL_TABLET | Freq: Every day | ORAL | Status: DC
  Administered 2017-06-10 – 2017-06-15 (×6): 1000 [IU] via ORAL
  Filled 2017-06-10 (×6): qty 1

## 2017-06-10 MED ORDER — BISACODYL 5 MG PO TBEC: 5.0000 mg | DELAYED_RELEASE_TABLET | Freq: Every day | ORAL | Status: DC | PRN

## 2017-06-10 MED ORDER — DEXTROSE 5 % IV SOLN
500.0000 mg | INTRAVENOUS | Status: DC
  Administered 2017-06-10 – 2017-06-11 (×2): 500 mg via INTRAVENOUS
  Filled 2017-06-10 (×3): qty 500

## 2017-06-10 MED ORDER — PANTOPRAZOLE SODIUM 40 MG PO TBEC
40.0000 mg | DELAYED_RELEASE_TABLET | Freq: Every day | ORAL | Status: DC
  Administered 2017-06-10 – 2017-06-15 (×6): 40 mg via ORAL
  Filled 2017-06-10 (×6): qty 1

## 2017-06-10 MED ORDER — GUAIFENESIN ER 600 MG PO TB12: 600.0000 mg | ORAL_TABLET | Freq: Two times a day (BID) | ORAL | Status: DC | PRN

## 2017-06-10 MED ORDER — IPRATROPIUM BROMIDE 0.02 % IN SOLN
0.5000 mg | Freq: Four times a day (QID) | RESPIRATORY_TRACT | Status: DC | PRN
  Administered 2017-06-12 – 2017-06-14 (×2): 0.5 mg via RESPIRATORY_TRACT
  Filled 2017-06-10 (×2): qty 2.5

## 2017-06-10 MED ORDER — SODIUM CHLORIDE 0.9 % IV SOLN
INTRAVENOUS | Status: DC
  Administered 2017-06-10: 02:00:00 via INTRAVENOUS

## 2017-06-10 MED ORDER — DILTIAZEM HCL ER COATED BEADS 120 MG PO CP24
240.0000 mg | ORAL_CAPSULE | Freq: Every day | ORAL | Status: DC
  Administered 2017-06-10 – 2017-06-15 (×6): 240 mg via ORAL
  Filled 2017-06-10 (×6): qty 2

## 2017-06-10 MED ORDER — ACETAMINOPHEN 325 MG PO TABS
650.0000 mg | ORAL_TABLET | Freq: Four times a day (QID) | ORAL | Status: DC | PRN
  Administered 2017-06-10 – 2017-06-15 (×8): 650 mg via ORAL
  Filled 2017-06-10 (×7): qty 2

## 2017-06-10 MED ORDER — ALBUTEROL SULFATE (2.5 MG/3ML) 0.083% IN NEBU
2.5000 mg | INHALATION_SOLUTION | Freq: Four times a day (QID) | RESPIRATORY_TRACT | Status: DC | PRN
  Administered 2017-06-12 – 2017-06-14 (×3): 2.5 mg via RESPIRATORY_TRACT
  Filled 2017-06-10 (×3): qty 3

## 2017-06-10 MED ORDER — TIOTROPIUM BROMIDE MONOHYDRATE 18 MCG IN CAPS
1.0000 | ORAL_CAPSULE | Freq: Every day | RESPIRATORY_TRACT | Status: DC
  Administered 2017-06-10 – 2017-06-15 (×6): 18 ug via RESPIRATORY_TRACT
  Filled 2017-06-10 (×2): qty 5

## 2017-06-10 MED ORDER — PAROXETINE HCL 20 MG PO TABS
20.0000 mg | ORAL_TABLET | Freq: Every day | ORAL | Status: DC
  Administered 2017-06-10 – 2017-06-15 (×6): 20 mg via ORAL
  Filled 2017-06-10 (×6): qty 1

## 2017-06-10 MED ORDER — ENOXAPARIN SODIUM 40 MG/0.4ML ~~LOC~~ SOLN
40.0000 mg | SUBCUTANEOUS | Status: DC
  Administered 2017-06-10 – 2017-06-15 (×6): 40 mg via SUBCUTANEOUS
  Filled 2017-06-10 (×6): qty 0.4

## 2017-06-10 MED ORDER — ACETAMINOPHEN 650 MG RE SUPP: 650.0000 mg | Freq: Four times a day (QID) | RECTAL | Status: DC | PRN

## 2017-06-10 NOTE — Progress Notes (Signed)
[facility] - [place] at [facility]   PATIENT NAME: Audrey Sexton    MR#:  [id number]  DATE OF BIRTH:  1950-12-16  SUBJECTIVE:  CHIEF COMPLAINT:   Chief Complaint  Patient presents with  . Shortness of Breath   Continues to have shortness of breath with wheezing. Afebrile. On 4 L oxygen.Wears 3 L at home.  REVIEW OF SYSTEMS:    Review of Systems  Constitutional: Positive for malaise/fatigue. Negative for chills and fever.  HENT: Negative for sore throat.   Eyes: Negative for blurred vision, double vision and pain.  Respiratory: Positive for cough, sputum production, shortness of breath and wheezing. Negative for hemoptysis.   Cardiovascular: Negative for chest pain, palpitations, orthopnea and leg swelling.  Gastrointestinal: Negative for abdominal pain, constipation, diarrhea, heartburn, nausea and vomiting.  Genitourinary: Negative for dysuria and hematuria.  Musculoskeletal: Negative for back pain and joint pain.  Skin: Negative for rash.  Neurological: Positive for weakness. Negative for sensory change, speech change, focal weakness and headaches.  Endo/Heme/Allergies: Does not bruise/bleed easily.  Psychiatric/Behavioral: Negative for depression. The patient is not nervous/anxious.     DRUG ALLERGIES:   Allergies  Allergen Reactions  . Percocet [Oxycodone-Acetaminophen] Nausea And Vomiting  . Vicodin [Hydrocodone-Acetaminophen] Nausea And Vomiting  . Penicillins Hives    Has patient had a PCN reaction causing immediate rash, facial/tongue/throat swelling, SOB or lightheadedness with hypotension: no Has patient had a PCN reaction causing severe rash involving mucus membranes or skin necrosis: no Has patient had a PCN reaction that required hospitalization  no Has patient had a PCN reaction occurring within the last 10 years: no If all of the above answers are "NO", then may proceed with Cephalosporin use.     VITALS:  Blood pressure (!) 149/79,  pulse 85, temperature 97.6 F (36.4 C), temperature source Oral, resp. rate (!) 21, height  (1.651 m), weight 95.4 kg (210 lb 6.4 oz), SpO2 94 %.  PHYSICAL EXAMINATION:   Physical Exam  GENERAL:  66 y.o.-year-old patient lying in the bed with no acute distress.  EYES: Pupils equal, round, reactive to light and accommodation. No scleral icterus. Extraocular muscles intact.  HEENT: Head atraumatic, normocephalic. Oropharynx and nasopharynx clear.  NECK:  Supple, no jugular venous distention. No thyroid enlargement, no tenderness.  LUNGS: decreased air entry bilaterally with expiratory wheezing CARDIOVASCULAR: S1, S2 normal. No murmurs, rubs, or gallops.  ABDOMEN: Soft, nontender, nondistended. Bowel sounds present. No organomegaly or mass.  EXTREMITIES: No cyanosis, clubbing or edema b/l.    NEUROLOGIC: Cranial nerves II through XII are intact. No focal Motor or sensory deficits b/l.   PSYCHIATRIC: The patient is alert and oriented x 3.  SKIN: No obvious rash, lesion, or ulcer.   LABORATORY PANEL:   CBC  Recent Labs Lab 06/10/17 0417  WBC 10.1  HGB 12.9  HCT 39.0  PLT 234   ------------------------------------------------------------------------------------------------------------------ Chemistries   Recent Labs Lab 06/09/17 1931 06/10/17 0417  NA 139 142  K 3.8 3.9  CL 104 106  CO2 27 27  GLUCOSE 112* 226*  BUN 21* 20  CREATININE 0.92 0.99  CALCIUM 9.4 9.4  AST 49*  --   ALT 41  --   ALKPHOS 54  --   BILITOT 0.5  --    ------------------------------------------------------------------------------------------------------------------  Cardiac Enzymes  Recent Labs Lab 06/09/17 1931  TROPONINI <0.03   ------------------------------------------------------------------------------------------------------------------  RADIOLOGY:  Dg Chest Portable 1 View  Result Date: 06/09/2017 CLINICAL DATA:  66 y/o F; shortness  of breath and bilateral expiratory  wheezes. Mid chest pain. History of COPD. EXAM: PORTABLE CHEST 1 VIEW COMPARISON:  01/01/2017 chest radiograph.  08/17/2016 CT chest. FINDINGS: Stable cardiac silhouette within normal limits. Aortic atherosclerosis with calcification. Stable right lung base reticular markings compatible with scarring. Emphysema. No focal consolidation. No pleural effusion or pneumothorax. Bones are unremarkable. IMPRESSION: Stable findings of COPD and right lung base scarring. No focal consolidation. Electronically Signed   By: Mitzi Hansen M.D.   On: 06/09/2017 20:05     ASSESSMENT AND PLAN:   * acute COPD exacerbation with acute on chronic respiratory failure -IV steroids, Antibiotics - Scheduled Nebulizers - Inhalers -Wean O2 as tolerated - Consult pulmonary if no improvement  * hypertension. Continue home medications. Uncontrolled. Add IV medications.  All the records are reviewed and case discussed with Care Management/Social Workerr. Management plans discussed with the patient, family and they are in agreement.  CODE STATUS: FULL CODE  DVT Prophylaxis: SCDs  TOTAL TIME TAKING CARE OF THIS PATIENT: 30 minutes.   POSSIBLE D/C IN 1-2 DAYS, DEPENDING ON CLINICAL CONDITION.  Milagros Loll R M.D on 06/10/2017 at 9:32 AM  Between 7am to 6pm - Pager - [phone]  After 6pm go to [email or web address] - password [email or web address] [facility]  [facility] [place] [facility]  Office  [phone]  CC: Primary care physician; Oswaldo Conroy, MD  Note: This dictation was prepared with Dragon dictation along with smaller phrase technology. Any transcriptional errors that result from this process are unintentional.

## 2017-06-10 NOTE — ED Notes (Signed)
Called floor to let them know patient on the way 

## 2017-06-10 NOTE — ED Notes (Signed)
Attempted to call report to floor. Primary RN unavailable at this time. Was informed primary RN will call back.

## 2017-06-10 NOTE — Evaluation (Signed)
Physical Therapy Evaluation Patient Details Name: Audrey Sexton MRN: [id number] DOB: 1950/12/28 Today's Date: 06/10/2017   History of Present Illness   66 y.o. 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  Admitted with acute respiratory failure.  Clinical Impression  Pt showed good effort and willingness to participate with PT but was not as steady with ambulation as she seemed to think she was and she fatigued very quickly with 50 ft of ambulation.  Pt on 4 liters and sats dropped to mid 80s with ~20 ft of ambulation and HR also increased to 120s.  She does not use an AD at baseline during ambulation but essentially needed UE use on wall/rail/etc the entire time she was up/walking.  Pt showed good effort but is not at her baseline. Pt not interested in walker as she would not have room in her home, we did discuss possibly using a SPC - pt still unsure.     Follow Up Recommendations Home health PT    Equipment Recommendations   (discussed possible need for a cane)    Recommendations for Other Services       Precautions / Restrictions Precautions Precautions: Fall Restrictions Weight Bearing Restrictions: No      Mobility  Bed Mobility Overal bed mobility: Independent             General bed mobility comments: Pt able to get herself to sitting EOB w/o issue  Transfers Overall transfer level: Modified independent Equipment used: None             General transfer comment: Some initial grabbing for rails/counter top, but able to rise/maintain balance w/o assist  Ambulation/Gait Ambulation/Gait assistance: Min assist Ambulation Distance (Feet): 50 Feet Assistive device: None       General Gait Details: Pt with very slow, hesitant, guarded ambulation.  Pt needing to use UEs on surface/wall/rail t/o most of the effort and generally did  not have great confidence.  She did not have any overt LOBs, but was generally unstable.  Pt quickly fatigued with the effort and had O2 drop to 85% (on 4 liters) and HR increase quickly to the 120s.    Stairs            Wheelchair Mobility    Modified Rankin (Stroke Patients Only)       Balance Overall balance assessment: Needs assistance Sitting-balance support: No upper extremity supported Sitting balance-Leahy Scale: Good     Standing balance support: During functional activity Standing balance-Leahy Scale: Poor Standing balance comment: Pt with definite need of walls, etc to maintain balance, discussed possibly using SPC                             Pertinent Vitals/Pain Pain Assessment: No/denies pain    Home Living Family/patient expects to be discharged to:: Private residence Living Arrangements: Alone Available Help at Discharge: Family (daughters live close) Type of Home: Apartment Home Access: Level entry     Home Layout: One level Home Equipment: Shower seat      Prior Function Level of Independence: Independent         Comments: Pt on 3 liters O2 at home, able to run her errands, etc     Hand Dominance        Extremity/Trunk Assessment   Upper Extremity Assessment Upper Extremity  Assessment: Overall WFL for tasks assessed    Lower Extremity Assessment Lower Extremity Assessment: Overall WFL for tasks assessed       Communication   Communication: No difficulties  Cognition Arousal/Alertness: Awake/alert Behavior During Therapy: WFL for tasks assessed/performed Overall Cognitive Status: Within Functional Limits for tasks assessed                                        General Comments      Exercises     Assessment/Plan    PT Assessment Patient needs continued PT services  PT Problem List Decreased strength;Decreased range of motion;Decreased activity tolerance;Decreased mobility;Decreased  balance;Decreased coordination;Decreased knowledge of use of DME;Decreased safety awareness;Cardiopulmonary status limiting activity       PT Treatment Interventions DME instruction;Gait training;Stair training;Functional mobility training;Therapeutic exercise;Therapeutic activities;Balance training;Neuromuscular re-education;Patient/family education    PT Goals (Current goals can be found in the Care Plan section)  Acute Rehab PT Goals Patient Stated Goal: go home PT Goal Formulation: With patient Time For Goal Achievement: 06/24/17 Potential to Achieve Goals: Fair    Frequency Min 2X/week   Barriers to discharge        Co-evaluation               AM-PAC PT "6 Clicks" Daily Activity  Outcome Measure Difficulty turning over in bed (including adjusting bedclothes, sheets and blankets)?: None Difficulty moving from lying on back to sitting on the side of the bed? : None Difficulty sitting down on and standing up from a chair with arms (e.g., wheelchair, bedside commode, etc,.)?: None Help needed moving to and from a bed to chair (including a wheelchair)?: A Little Help needed walking in hospital room?: A Little Help needed climbing 3-5 steps with a railing? : A Lot 6 Click Score: 20    End of Session Equipment Utilized During Treatment: Gait belt;Oxygen Activity Tolerance: Patient limited by fatigue Patient left: with bed alarm set;with nursing/sitter in room;with call bell/phone within reach Nurse Communication: Mobility status PT Visit Diagnosis: Muscle weakness (generalized) (M62.81);Difficulty in walking, not elsewhere classified (R26.2)    Time: 1007-1030 PT Time Calculation (min) (ACUTE ONLY): 23 min   Charges:   PT Evaluation $PT Eval Low Complexity: 1 Low     PT G Codes:   PT G-Codes **NOT FOR INPATIENT CLASS** Functional Assessment Tool Used: AM-PAC 6 Clicks Basic Mobility Functional Limitation: Mobility: Walking and moving around Mobility: Walking and  Moving Around Current Status (Z6109): At least 20 percent but less than 40 percent impaired, limited or restricted Mobility: Walking and Moving Around Goal Status [phone]): At least 1 percent but less than 20 percent impaired, limited or restricted    Malachi Pro, DPT 06/10/2017, 12:53 PM

## 2017-06-11 LAB — HEMOGLOBIN A1C
Hgb A1c MFr Bld: 5.9 % — ABNORMAL HIGH (ref 4.8–5.6)
Mean Plasma Glucose: 122.63 mg/dL

## 2017-06-11 LAB — GLUCOSE, CAPILLARY
GLUCOSE-CAPILLARY: 266 mg/dL — AB (ref 65–99)
Glucose-Capillary: 253 mg/dL — ABNORMAL HIGH (ref 65–99)

## 2017-06-11 MED ORDER — POTASSIUM CHLORIDE CRYS ER 10 MEQ PO TBCR
10.0000 meq | EXTENDED_RELEASE_TABLET | Freq: Every day | ORAL | Status: AC
  Administered 2017-06-11 – 2017-06-12 (×2): 10 meq via ORAL
  Filled 2017-06-11 (×2): qty 1

## 2017-06-11 MED ORDER — HYDROCHLOROTHIAZIDE 12.5 MG PO CAPS
12.5000 mg | ORAL_CAPSULE | Freq: Every day | ORAL | Status: DC
  Administered 2017-06-11 – 2017-06-15 (×5): 12.5 mg via ORAL
  Filled 2017-06-11 (×5): qty 1

## 2017-06-11 MED ORDER — AZITHROMYCIN 250 MG PO TABS
500.0000 mg | ORAL_TABLET | Freq: Every day | ORAL | Status: AC
  Administered 2017-06-12 – 2017-06-14 (×3): 500 mg via ORAL
  Filled 2017-06-11 (×3): qty 2

## 2017-06-11 MED ORDER — INSULIN ASPART 100 UNIT/ML ~~LOC~~ SOLN
0.0000 [IU] | Freq: Three times a day (TID) | SUBCUTANEOUS | Status: DC
  Administered 2017-06-11: 8 [IU] via SUBCUTANEOUS
  Administered 2017-06-12: 11 [IU] via SUBCUTANEOUS
  Administered 2017-06-12: 5 [IU] via SUBCUTANEOUS
  Administered 2017-06-12: 8 [IU] via SUBCUTANEOUS
  Administered 2017-06-13 (×2): 5 [IU] via SUBCUTANEOUS
  Administered 2017-06-13: 3 [IU] via SUBCUTANEOUS
  Administered 2017-06-14 (×2): 8 [IU] via SUBCUTANEOUS
  Administered 2017-06-14: 11 [IU] via SUBCUTANEOUS
  Administered 2017-06-15 (×2): 8 [IU] via SUBCUTANEOUS
  Filled 2017-06-11 (×12): qty 1

## 2017-06-11 MED ORDER — LORATADINE 10 MG PO TABS
10.0000 mg | ORAL_TABLET | Freq: Every day | ORAL | Status: DC
  Administered 2017-06-11 – 2017-06-15 (×5): 10 mg via ORAL
  Filled 2017-06-11 (×5): qty 1

## 2017-06-11 NOTE — Progress Notes (Signed)
[facility] - [place] at [facility]   PATIENT NAME: Audrey Sexton    MR#:  [id number]  DATE OF BIRTH:  07-06-1951  SUBJECTIVE:  CHIEF COMPLAINT:   Chief Complaint  Patient presents with  . Shortness of Breath   Continues to have shortness of breath with wheezing. No significant breathing Afebrile. On 3.4 L oxygen.Wears 3 L at home.  REVIEW OF SYSTEMS:    Review of Systems  Constitutional: Positive for malaise/fatigue. Negative for chills and fever.  HENT: Negative for sore throat.   Eyes: Negative for blurred vision, double vision and pain.  Respiratory: Positive for cough, sputum production, shortness of breath and wheezing. Negative for hemoptysis.   Cardiovascular: Negative for chest pain, palpitations, orthopnea and leg swelling.  Gastrointestinal: Negative for abdominal pain, constipation, diarrhea, heartburn, nausea and vomiting.  Genitourinary: Negative for dysuria and hematuria.  Musculoskeletal: Negative for back pain and joint pain.  Skin: Negative for rash.  Neurological: Positive for weakness. Negative for sensory change, speech change, focal weakness and headaches.  Endo/Heme/Allergies: Does not bruise/bleed easily.  Psychiatric/Behavioral: Negative for depression. The patient is not nervous/anxious.     DRUG ALLERGIES:   Allergies  Allergen Reactions  . Percocet [Oxycodone-Acetaminophen] Nausea And Vomiting  . Vicodin [Hydrocodone-Acetaminophen] Nausea And Vomiting  . Penicillins Hives    Has patient had a PCN reaction causing immediate rash, facial/tongue/throat swelling, SOB or lightheadedness with hypotension: no Has patient had a PCN reaction causing severe rash involving mucus membranes or skin necrosis: no Has patient had a PCN reaction that required hospitalization  no Has patient had a PCN reaction occurring within the last 10 years: no If all of the above answers are "NO", then may proceed with Cephalosporin use.     VITALS:   Blood pressure (!) 169/116, pulse 97, temperature 98.3 F (36.8 C), temperature source Oral, resp. rate 20, height  (1.651 m), weight 95.8 kg (211 lb 1.6 oz), SpO2 93 %.  PHYSICAL EXAMINATION:   Physical Exam  GENERAL:  66 y.o.-year-old patient lying in the bed with no acute distress.  EYES: Pupils equal, round, reactive to light and accommodation. No scleral icterus. Extraocular muscles intact.  HEENT: Head atraumatic, normocephalic. Oropharynx and nasopharynx clear.  NECK:  Supple, no jugular venous distention. No thyroid enlargement, no tenderness.  LUNGS: decreased air entry bilaterally with expiratory wheezing CARDIOVASCULAR: S1, S2 normal. No murmurs, rubs, or gallops.  ABDOMEN: Soft, nontender, nondistended. Bowel sounds present. No organomegaly or mass.  EXTREMITIES: No cyanosis, clubbing or edema b/l.    NEUROLOGIC: Cranial nerves II through XII are intact. No focal Motor or sensory deficits b/l.   PSYCHIATRIC: The patient is alert and oriented x 3.  SKIN: No obvious rash, lesion, or ulcer.   LABORATORY PANEL:   CBC  Recent Labs Lab 06/10/17 0417  WBC 10.1  HGB 12.9  HCT 39.0  PLT 234   ------------------------------------------------------------------------------------------------------------------ Chemistries   Recent Labs Lab 06/09/17 1931 06/10/17 0417  NA 139 142  K 3.8 3.9  CL 104 106  CO2 27 27  GLUCOSE 112* 226*  BUN 21* 20  CREATININE 0.92 0.99  CALCIUM 9.4 9.4  AST 49*  --   ALT 41  --   ALKPHOS 54  --   BILITOT 0.5  --    ------------------------------------------------------------------------------------------------------------------  Cardiac Enzymes  Recent Labs Lab 06/09/17 1931  TROPONINI <0.03   ------------------------------------------------------------------------------------------------------------------  RADIOLOGY:  Dg Chest Portable 1 View  Result Date: 06/09/2017 CLINICAL DATA:  66 y/o  F; shortness of breath and  bilateral expiratory wheezes. Mid chest pain. History of COPD. EXAM: PORTABLE CHEST 1 VIEW COMPARISON:  01/01/2017 chest radiograph.  08/17/2016 CT chest. FINDINGS: Stable cardiac silhouette within normal limits. Aortic atherosclerosis with calcification. Stable right lung base reticular markings compatible with scarring. Emphysema. No focal consolidation. No pleural effusion or pneumothorax. Bones are unremarkable. IMPRESSION: Stable findings of COPD and right lung base scarring. No focal consolidation. Electronically Signed   By: Mitzi Hansen M.D.   On: 06/09/2017 20:05     ASSESSMENT AND PLAN:   * Acute COPD exacerbation with acute on chronic respiratory failure- still has wheezing and SOB -IV steroids - Scheduled Nebulizers - Inhalers -Wean O2 as tolerated - Consult pulmonary if no improvement  * hypertension. Continue home medications. Uncontrolled.  Add HCTZ and Kcl  All the records are reviewed and case discussed with Care Management/Social Workerr. Management plans discussed with the patient, family and they are in agreement.  CODE STATUS: FULL CODE  DVT Prophylaxis: SCDs  TOTAL TIME TAKING CARE OF THIS PATIENT: 30 minutes.   POSSIBLE D/C IN 1-2 DAYS, DEPENDING ON CLINICAL CONDITION.  Milagros Loll R M.D on 06/11/2017 at 9:35 AM  Between 7am to 6pm - Pager - [phone]  After 6pm go to [email or web address] - password [email or web address]  [facility] [place] [facility]  Office  [phone]  CC: Primary care physician; Oswaldo Conroy, MD  Note: This dictation was prepared with Dragon dictation along with smaller phrase technology. Any transcriptional errors that result from this process are unintentional.

## 2017-06-11 NOTE — Care Management Important Message (Signed)
Important Message  Patient Details  Name: Audrey Sexton MRN: [id number] Date of Birth: [birth year]   Medicare Important Message Given:  Yes    Gwenette Greet, RN 06/11/2017, 9:39 AM

## 2017-06-11 NOTE — Care Management Note (Signed)
Case Management Note  Patient Details  Name: Audrey Sexton MRN: [id number] Date of Birth: December 08, 1950  Subjective/Objective: Admitted to [facility] with the diagnosis of acute respiratory failure. Lives alone Daughter is Glee Arvin [phone]). Lat seen Dr. Hessie Diener in January 2018. Prescriptions are filled at [facility]. Home Health per [facility] in the past. No skilled nursing .  Home oxygen per [employer] x 7 years. Uses 3 liters per nasal cannula continuous. Rolling walker, concentrator  and cane in the home.  Takes care of all basic activities of daily living herself, doesn't drive. Last fall was 2016. Good appetite. Daughter will transport.                  Action/Plan: Physical therapy evaluation completed. Recommending home with home health and physical therapy. Would like [facility] again   Expected Discharge Date:                  Expected Discharge Plan:     In-House Referral:     Discharge planning Services     Post Acute Care Choice:   yes Choice offered to:   patient  DME Arranged:    DME Agency:     HH Arranged:   yes  HH Agency:   [facility]   Status of Service:     If discussed at Long Length of Stay Meetings, dates discussed:    Additional Comments:  Gwenette Greet, RN MSN CCM Care Management [phone] 06/11/2017, 11:48 AM

## 2017-06-12 LAB — GLUCOSE, CAPILLARY
GLUCOSE-CAPILLARY: 212 mg/dL — AB (ref 65–99)
GLUCOSE-CAPILLARY: 304 mg/dL — AB (ref 65–99)
Glucose-Capillary: 268 mg/dL — ABNORMAL HIGH (ref 65–99)
Glucose-Capillary: 290 mg/dL — ABNORMAL HIGH (ref 65–99)

## 2017-06-12 MED ORDER — INSULIN ASPART 100 UNIT/ML ~~LOC~~ SOLN
8.0000 [IU] | Freq: Once | SUBCUTANEOUS | Status: AC
  Administered 2017-06-12: 8 [IU] via SUBCUTANEOUS

## 2017-06-12 MED ORDER — CARBAMIDE PEROXIDE 6.5 % OT SOLN
5.0000 [drp] | Freq: Two times a day (BID) | OTIC | Status: DC
  Administered 2017-06-12 – 2017-06-15 (×7): 5 [drp] via OTIC
  Filled 2017-06-12: qty 15

## 2017-06-12 NOTE — Progress Notes (Signed)
[facility] - [place] [facility]   PATIENT NAME: Audrey Sexton    MR#:  [id number]  DATE OF BIRTH:  1951-04-09  SUBJECTIVE:  CHIEF COMPLAINT:   Chief Complaint  Patient presents with  . Shortness of Breath   Still has SOB and wheezing. Using bedside commode due to SOB with minimal ambulation  REVIEW OF SYSTEMS:    Review of Systems  Constitutional: Positive for malaise/fatigue. Negative for chills and fever.  HENT: Negative for sore throat.   Eyes: Negative for blurred vision, double vision and pain.  Respiratory: Positive for cough, sputum production, shortness of breath and wheezing. Negative for hemoptysis.   Cardiovascular: Negative for chest pain, palpitations, orthopnea and leg swelling.  Gastrointestinal: Negative for abdominal pain, constipation, diarrhea, heartburn, nausea and vomiting.  Genitourinary: Negative for dysuria and hematuria.  Musculoskeletal: Negative for back pain and joint pain.  Skin: Negative for rash.  Neurological: Positive for weakness. Negative for sensory change, speech change, focal weakness and headaches.  Endo/Heme/Allergies: Does not bruise/bleed easily.  Psychiatric/Behavioral: Negative for depression. The patient is not nervous/anxious.     DRUG ALLERGIES:   Allergies  Allergen Reactions  . Percocet [Oxycodone-Acetaminophen] Nausea And Vomiting  . Vicodin [Hydrocodone-Acetaminophen] Nausea And Vomiting  . Penicillins Hives    Has patient had a PCN reaction causing immediate rash, facial/tongue/throat swelling, SOB or lightheadedness with hypotension: no Has patient had a PCN reaction causing severe rash involving mucus membranes or skin necrosis: no Has patient had a PCN reaction that required hospitalization  no Has patient had a PCN reaction occurring within the last 10 years: no If all of the above answers are "NO", then may proceed with Cephalosporin use.     VITALS:  Blood pressure (!) 177/90, pulse 86,  temperature (!) 97.4 F (36.3 C), temperature source Oral, resp. rate 18, height  (1.651 m), weight 95.8 kg (211 lb 1.6 oz), SpO2 97 %.  PHYSICAL EXAMINATION:   Physical Exam  GENERAL:  66 y.o.-year-old patient lying in the bed with no acute distress.  EYES: Pupils equal, round, reactive to light and accommodation. No scleral icterus. Extraocular muscles intact.  HEENT: Head atraumatic, normocephalic. Oropharynx and nasopharynx clear.  NECK:  Supple, no jugular venous distention. No thyroid enlargement, no tenderness.  LUNGS: decreased air entry bilaterally with expiratory wheezing CARDIOVASCULAR: S1, S2 normal. No murmurs, rubs, or gallops.  ABDOMEN: Soft, nontender, nondistended. Bowel sounds present. No organomegaly or mass.  EXTREMITIES: No cyanosis, clubbing or edema b/l.    NEUROLOGIC: Cranial nerves II through XII are intact. No focal Motor or sensory deficits b/l.   PSYCHIATRIC: The patient is alert and oriented x 3.  SKIN: No obvious rash, lesion, or ulcer.   LABORATORY PANEL:   CBC  Recent Labs Lab 06/10/17 0417  WBC 10.1  HGB 12.9  HCT 39.0  PLT 234   ------------------------------------------------------------------------------------------------------------------ Chemistries   Recent Labs Lab 06/09/17 1931 06/10/17 0417  NA 139 142  K 3.8 3.9  CL 104 106  CO2 27 27  GLUCOSE 112* 226*  BUN 21* 20  CREATININE 0.92 0.99  CALCIUM 9.4 9.4  AST 49*  --   ALT 41  --   ALKPHOS 54  --   BILITOT 0.5  --    ------------------------------------------------------------------------------------------------------------------  Cardiac Enzymes  Recent Labs Lab 06/09/17 1931  TROPONINI <0.03   ------------------------------------------------------------------------------------------------------------------  RADIOLOGY:  No results found.   ASSESSMENT AND PLAN:   * Acute COPD exacerbation with acute on chronic respiratory  failure- still has wheezing and  SOB -IV steroids - Scheduled Nebulizers - Inhalers -Wean O2 as tolerated - Consult pulmonary if no improvement by tomorrow  * Hypertension. Continue home medications. Uncontrolled.  Added HCTZ and Kcl. Still high.  All the records are reviewed and case discussed with Care Management/Social Workerr. Management plans discussed with the patient, family and they are in agreement.  CODE STATUS: FULL CODE  DVT Prophylaxis: SCDs  TOTAL TIME TAKING CARE OF THIS PATIENT: 30 minutes.   POSSIBLE D/C IN 1-2 DAYS, DEPENDING ON CLINICAL CONDITION.  Milagros Loll R M.D on 06/12/2017 at 10:37 AM  Between 7am to 6pm - Pager - [phone]  After 6pm go to [email or web address] - password [email or web address] [email or web address]  [facility] [place] [facility]  Office  [phone]  CC: Primary care physician; Oswaldo Conroy, MD  Note: This dictation was prepared with Dragon dictation along with smaller phrase technology. Any transcriptional errors that result from this process are unintentional.

## 2017-06-12 NOTE — Care Management (Signed)
Physical therapy evaluation completed. Recommending skilled nursing facility. Will update [facility] and Minerva Areola, Clinical Social Worker Gwenette Greet RN MSN CCM Care management [phone]

## 2017-06-12 NOTE — Progress Notes (Signed)
Talked to Dr. Imogene Burn about patient's blood sugar of 290 tonight, no sliding scale at bedtime, order to correct dose per sliding scale which is 8 units of novolog. No other concern at the moment. RN will continue to monitor.

## 2017-06-12 NOTE — Progress Notes (Signed)
Physical Therapy Treatment Patient Details Name: Audrey Sexton MRN: [id number] DOB: 11-08-1950 Today's Date: 06/12/2017    History of Present Illness  66 y.o. 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  Admitted with acute respiratory failure.    PT Comments    Pt sitting edge of bed upon arrival.  O2 increased by nursing to 4 lpm since last session.  Sats 96% at rest.  Cane trial this session.  Continues to decline RW stating her home will not accommodate it.  She was able to stand without assist but was very hesitant to let go of rails to reach for Audrey Sexton and hand held assist.  Stood for several moments before attempting.  She was then able to ambulate 30' x 2 on 4 lpm with SPC left and hand held assist on right.  Sats decreased to 89% on 4 lpm with gait.  Gait slow and guarded.  Discussed at length discharge plan with pt.  Pt is open to and would benefit from SNF/rehab upon discharge as she is aware she is not at her baseline and voices concern over her ability to manage at home.  Due to continued limitations in mobility and assistance level needed to ambulate limited distances, will change recommendations to SNF upon discharge.   Follow Up Recommendations   SNF     Equipment Recommendations   Audrey Sexton - pt refusing RW    Recommendations for Other Services       Precautions / Restrictions Precautions Precautions: Fall Restrictions Weight Bearing Restrictions: No    Mobility  Bed Mobility Overal bed mobility: Independent             General bed mobility comments: Pt able to get herself to sitting EOB w/o issue  Transfers Overall transfer level: Needs assistance Equipment used: Straight cane Transfers: Sit to/from Stand           General transfer comment: uses rails, able to rise without assist but once standing is unable to reach for [facility]  without assist.  Ambulation/Gait Ambulation/Gait assistance: Min assist Ambulation Distance (Feet): 30 Feet Assistive device: Straight cane Gait Pattern/deviations: Step-to pattern     General Gait Details: slow and hesitant with guarded ambulation.  SPC in left hand held assist on right - unable to walk without BUE support.   Stairs            Wheelchair Mobility    Modified Rankin (Stroke Patients Only)       Balance Overall balance assessment: Needs assistance Sitting-balance support: No upper extremity supported Sitting balance-Leahy Scale: Good     Standing balance support: During functional activity;Bilateral upper extremity supported Standing balance-Leahy Scale: Poor Standing balance comment: Pt with definite need of walls, etc to maintain balance.                            Cognition Arousal/Alertness: Awake/alert Behavior During Therapy: WFL for tasks assessed/performed Overall Cognitive Status: Within Functional Limits for tasks assessed                                        Exercises      General Comments        Pertinent Vitals/Pain Pain Assessment: No/denies pain  Home Living                      Prior Function            PT Goals (current goals can now be found in the care plan section) Progress towards PT goals: Progressing toward goals    Frequency           PT Plan      Co-evaluation              AM-PAC PT "6 Clicks" Daily Activity  Outcome Measure                   End of Session               Time: 1610-9604 PT Time Calculation (min) (ACUTE ONLY): 23 min  Charges:  $Gait Training: 23-37 mins                    G Codes:       Audrey Sexton, PTA 06/12/17, 10:56 AM

## 2017-06-12 NOTE — Progress Notes (Signed)
Inpatient Diabetes Program Recommendations  AACE/ADA: New Consensus Statement on Inpatient Glycemic Control (2015)  Target Ranges:  Prepandial:   less than 140 mg/dL      Peak postprandial:   less than 180 mg/dL (1-2 hours)      Critically ill patients:  140 - 180 mg/dL   Results for SMT., LODER (MRN [id number]) as of 06/12/2017 09:38  Ref. Range 06/11/2017 17:12 06/11/2017 20:47 06/12/2017 07:25  Glucose-Capillary Latest Ref Range: 65 - 99 mg/dL 409 (H) 811 (H) 914 (H)   Review of Glycemic Control  Diabetes history: None Current orders for Inpatient glycemic control: Novolog Moderate 0-15 units tid  Inpatient Diabetes Program Recommendations:    Glucose in 200's after increase in Novolog Correction.  Consider ordering Novolog HS scale 0-5 units.  If IV Solumedrol is continued at current dose, may want to consider adding Novolog 2-3 units tid meal coverage in addition to correction (only give if patient eats >50% of meals).   Thanks,  Christena Deem RN, MSN, [facility] Inpatient Diabetes Coordinator Team Pager [phone] (8a-5p)

## 2017-06-12 NOTE — Progress Notes (Signed)
Inpatient Diabetes Program Recommendations  AACE/ADA: New Consensus Statement on Inpatient Glycemic Control (2015)  Target Ranges:  Prepandial:   less than 140 mg/dL      Peak postprandial:   less than 180 mg/dL (1-2 hours)      Critically ill patients:  140 - 180 mg/dL   Review of Glycemic Control Results for Audrey Sexton, Audrey Sexton (MRN [id number]) as of 06/12/2017 09:38  Ref. Range 06/11/2017 17:12 06/11/2017 20:47 06/12/2017 07:25  Glucose-Capillary Latest Ref Range: 65 - 99 mg/dL 409 (H) 811 (H) 914 (H)  Results for Audrey Sexton, Audrey Sexton (MRN [id number]) as of 06/12/2017 09:38  Ref. Range 06/11/2017 15:01  Hemoglobin A1C Latest Ref Range: 4.8 - 5.6 % 5.9 (H)   Diabetes history: No Outpatient Diabetes medications: NA Current orders for Inpatient glycemic control:Novolog 0-15 units TID with meals  Inpatient Diabetes Program Recommendations: Correction (SSI): Please consider ordering Novolog 0-5 units QHS for bedtime correction. Insulin - Meal Coverage: If steroids continued as ordered, please consider ordering Novolog 3 units TID with meals for meal coverage if patient eats at least 50% of meals.  Thanks, Orlando Penner, RN, MSN, CDE Diabetes Coordinator Inpatient Diabetes Program [phone] (Team Pager from 8am to 5pm)

## 2017-06-12 NOTE — Plan of Care (Signed)
Problem: Safety: Goal: Ability to remain free from injury will improve Outcome: Progressing  Patient's VSS throughout the shift. Patient denies pain. Patient still has dyspnea with exertion. Patient resting well. RN will continue to monitor.

## 2017-06-13 ENCOUNTER — Encounter: Payer: Self-pay | Admitting: *Deleted

## 2017-06-13 LAB — GLUCOSE, CAPILLARY
GLUCOSE-CAPILLARY: 178 mg/dL — AB (ref 65–99)
GLUCOSE-CAPILLARY: 255 mg/dL — AB (ref 65–99)
Glucose-Capillary: 229 mg/dL — ABNORMAL HIGH (ref 65–99)
Glucose-Capillary: 238 mg/dL — ABNORMAL HIGH (ref 65–99)

## 2017-06-13 MED ORDER — LISINOPRIL 10 MG PO TABS
10.0000 mg | ORAL_TABLET | Freq: Every day | ORAL | Status: DC
  Administered 2017-06-13 – 2017-06-15 (×3): 10 mg via ORAL
  Filled 2017-06-13 (×3): qty 1

## 2017-06-13 MED ORDER — INSULIN GLARGINE 100 UNIT/ML ~~LOC~~ SOLN
10.0000 [IU] | Freq: Every day | SUBCUTANEOUS | Status: DC
  Administered 2017-06-13 – 2017-06-14 (×2): 10 [IU] via SUBCUTANEOUS
  Filled 2017-06-13 (×2): qty 0.1

## 2017-06-13 NOTE — Clinical Social Work Note (Signed)
Clinical Social Work Assessment  Patient Details  Name: Audrey Sexton MRN: [id number] Date of Birth: [birth year]  Date of referral:  06/13/17               Reason for consult:  Facility Placement                Permission sought to share information with:  Family Supports, Magazine features editoracility Contact Representative Permission granted to share information::  Yes, Verbal Permission Granted  Name::     Asencion NobleGraves,[name]   [phone]   Agency::  SNF admissions  Relationship::     Contact Information:     Housing/Transportation Living arrangements for the past 2 months:  Apartment Source of Information:  Patient Patient Interpreter Needed:  None Criminal Activity/Legal Involvement Pertinent to Current Situation/Hospitalization:  No - Comment as needed Significant Relationships:  Adult Children Lives with:  Self Do you feel safe going back to the place where you live?  No Need for family participation in patient care:  No (Coment)  Care giving concerns: Patient feels she needs some short term rehab before she is able to return back home.   Social Worker assessment / plan:  Patient is a 66 year old female who is alert and oriented x4.  Patient lives alone and states she has not been to rehab before.  CSW explained role of social worker and process for looking for placement.  Patient was informed how insurance will pay for stay at SNF, and what to expect.  Patient stated she feels like she needs SNF before she is able to return back home because of her being so weak.  Patient did not have any other questions and gave CSW permission to [place].  Employment status:  Retired Database administratornsurance information:  Managed Medicare PT Recommendations:  Skilled Nursing Facility Information / Referral to community resources:  Skilled Nursing Facility  Patient/Family's Response to care: Patient is in agreement to going to SNF for short term rehab.  Patient/Family's Understanding of  and Emotional Response to Diagnosis, Current Treatment, and Prognosis:  Patient is hopeful she will not have to be in SNF for very long. Emotional Assessment Appearance:  Appears stated age Attitude/Demeanor/Rapport:    Affect (typically observed):  Calm, Appropriate, Pleasant Orientation:  Oriented to Self, Oriented to Place, Oriented to  Time, Oriented to Situation Alcohol / Substance use:  Not Applicable Psych involvement (Current and /or in the community):  No (Comment)  Discharge Needs  Concerns to be addressed:  Lack of Support Readmission within the last 30 days:  No Current discharge risk:  None Barriers to Discharge:  Insurance Authorization   [place] [employer], Vonnie Ligman R, LCSWA 06/13/2017, 4:38 PM

## 2017-06-13 NOTE — Progress Notes (Signed)
[facility] [place] [facility]   PATIENT NAME: Audrey Sexton    MR#:  [id number]  DATE OF BIRTH:  December 21, 1950  SUBJECTIVE:  CHIEF COMPLAINT:   Chief Complaint  Patient presents with  . Shortness of Breath   Continues to have SOB and wheezing. IMproving Blodd sugars high into 300s  REVIEW OF SYSTEMS:    Review of Systems  Constitutional: Positive for malaise/fatigue. Negative for chills and fever.  HENT: Negative for sore throat.   Eyes: Negative for blurred vision, double vision and pain.  Respiratory: Positive for cough, sputum production, shortness of breath and wheezing. Negative for hemoptysis.   Cardiovascular: Negative for chest pain, palpitations, orthopnea and leg swelling.  Gastrointestinal: Negative for abdominal pain, constipation, diarrhea, heartburn, nausea and vomiting.  Genitourinary: Negative for dysuria and hematuria.  Musculoskeletal: Negative for back pain and joint pain.  Skin: Negative for rash.  Neurological: Positive for weakness. Negative for sensory change, speech change, focal weakness and headaches.  Endo/Heme/Allergies: Does not bruise/bleed easily.  Psychiatric/Behavioral: Negative for depression. The patient is not nervous/anxious.     DRUG ALLERGIES:   Allergies  Allergen Reactions  . Percocet [Oxycodone-Acetaminophen] Nausea And Vomiting  . Vicodin [Hydrocodone-Acetaminophen] Nausea And Vomiting  . Penicillins Hives    Has patient had a PCN reaction causing immediate rash, facial/tongue/throat swelling, SOB or lightheadedness with hypotension: no Has patient had a PCN reaction causing severe rash involving mucus membranes or skin necrosis: no Has patient had a PCN reaction that required hospitalization  no Has patient had a PCN reaction occurring within the last 10 years: no If all of the above answers are "NO", then may proceed with Cephalosporin use.     VITALS:  Blood pressure (!) 168/73, pulse 79, temperature  98.1 F (36.7 C), temperature source Oral, resp. rate 18, height 5\' 5"  (1.651 m), weight 95.8 kg (211 lb 1.6 oz), SpO2 94 %.  PHYSICAL EXAMINATION:   Physical Exam  GENERAL:  66 y.o.-year-old patient lying in the bed with no acute distress.  EYES: Pupils equal, round, reactive to light and accommodation. No scleral icterus. Extraocular muscles intact.  HEENT: Head atraumatic, normocephalic. Oropharynx and nasopharynx clear.  NECK:  Supple, no jugular venous distention. No thyroid enlargement, no tenderness.  LUNGS: decreased air entry bilaterally with expiratory wheezing CARDIOVASCULAR: S1, S2 normal. No murmurs, rubs, or gallops.  ABDOMEN: Soft, nontender, nondistended. Bowel sounds present. No organomegaly or mass.  EXTREMITIES: No cyanosis, clubbing or edema b/l.    NEUROLOGIC: Cranial nerves II through XII are intact. No focal Motor or sensory deficits b/l.   PSYCHIATRIC: The patient is alert and oriented x 3.  SKIN: No obvious rash, lesion, or ulcer.   LABORATORY PANEL:   CBC  Recent Labs Lab 06/10/17 0417  WBC 10.1  HGB 12.9  HCT 39.0  PLT 234   ------------------------------------------------------------------------------------------------------------------ Chemistries   Recent Labs Lab 06/09/17 1931 06/10/17 0417  NA 139 142  K 3.8 3.9  CL 104 106  CO2 27 27  GLUCOSE 112* 226*  BUN 21* 20  CREATININE 0.92 0.99  CALCIUM 9.4 9.4  AST 49*  --   ALT 41  --   ALKPHOS 54  --   BILITOT 0.5  --    ------------------------------------------------------------------------------------------------------------------  Cardiac Enzymes  Recent Labs Lab 06/09/17 1931  TROPONINI <0.03   ------------------------------------------------------------------------------------------------------------------  RADIOLOGY:  No results found.   ASSESSMENT AND PLAN:  * Hyperglycemia due to IV steroids Add SSI Will add daily lantus 10  units while on IV steroids in the  hospital  * Acute COPD exacerbation with acute on chronic respiratory failure- still has wheezing and SOB -IV steroids - Scheduled Nebulizers - Inhalers -Wean O2 as tolerated  * Hypertension. Continue home medications. Uncontrolled.  Added HCTZ and Kcl.  All the records are reviewed and case discussed with Care Management/Social Workerr. Management plans discussed with the patient, family and they are in agreement.  CODE STATUS: FULL CODE  DVT Prophylaxis: SCDs  TOTAL TIME TAKING CARE OF THIS PATIENT: 30 minutes.   POSSIBLE D/C IN 1-2 DAYS, DEPENDING ON CLINICAL CONDITION.  Milagros Loll R M.D on 06/13/2017 at 10:12 AM  Between 7am to 6pm - Pager - [phone]  After 6pm go to [email or web address] - password [email or web address] [facility]  [facility] [place] [facility]  Office  [phone]  CC: Primary care physician; Oswaldo Conroy, MD  Note: This dictation was prepared with Dragon dictation along with smaller phrase technology. Any transcriptional errors that result from this process are unintentional.

## 2017-06-13 NOTE — NC FL2 (Signed)
[place] MEDICAID FL2 LEVEL OF CARE SCREENING TOOL     IDENTIFICATION  Patient Name: Audrey Sexton Birthdate: 08-16-1951 Sex: female Admission Date (Current Location): 06/09/2017  [place] and [place] Number:  Chiropodist and Address:  [facility], [address] [place], [place] [address]      Provider Number: [id number]  Attending Physician Name and Address:  Milagros Loll, MD  Relative Name and Phone Number:  Kriston, Mckinnie Daughter   [phone]     Current Level of Care: Hospital Recommended Level of Care: Skilled Nursing Facility Prior Approval Number:    Date Approved/Denied:   PASRR Number: [id number]  Discharge Plan: SNF    Current Diagnoses: Patient Active Problem List   Diagnosis Date Noted  . Acute respiratory failure with hypoxia (HCC) 06/09/2017  . Acute respiratory distress   . DNR (do not resuscitate) discussion 08/23/2016  . Palliative care by specialist 08/23/2016  . Dyspnea 08/23/2016  . COPD exacerbation (HCC) 08/18/2016    Orientation RESPIRATION BLADDER Height & Weight     Self, Time, Place, Situation  O2 (3L) Continent Weight: 211 lb 1.6 oz (95.8 kg) Height:  5\' 5"  (165.1 cm)  BEHAVIORAL SYMPTOMS/MOOD NEUROLOGICAL BOWEL NUTRITION STATUS      Continent Diet  AMBULATORY STATUS COMMUNICATION OF NEEDS Skin   Limited Assist Verbally Normal                       Personal Care Assistance Level of Assistance  Bathing, Feeding, Dressing Bathing Assistance: Limited assistance Feeding assistance: Independent Dressing Assistance: Limited assistance     Functional Limitations Info  Hearing, Speech   Hearing Info: Adequate Speech Info: Adequate    SPECIAL CARE FACTORS FREQUENCY  PT (By licensed PT)     PT Frequency: 5x a week              Contractures Contractures Info: Not present    Additional Factors Info  Code Status, Allergies, Insulin Sliding Scale, Psychotropic Code Status  Info: Full Code Allergies Info: PERCOCET OXYCODONE-ACETAMINOPHEN, VICODIN HYDROCODONE-ACETAMINOPHEN, PENICILLINS  Psychotropic Info: PARoxetine (PAXIL) tablet 20 mg Insulin Sliding Scale Info: insulin aspart (novoLOG) injection 0-15 Units 3x a day with meals       Current Medications (06/13/2017):  This is the current hospital active medication list Current Facility-Administered Medications  Medication Dose Route Frequency Provider Last Rate Last Dose  . acetaminophen (TYLENOL) tablet 650 mg  650 mg Oral Q6H PRN Hugelmeyer, Alexis, DO   650 mg at 06/13/17 3875   Or  . acetaminophen (TYLENOL) suppository 650 mg  650 mg Rectal Q6H PRN Hugelmeyer, Alexis, DO      . albuterol (PROVENTIL) (2.5 MG/3ML) 0.083% nebulizer solution 2.5 mg  2.5 mg Nebulization Q6H PRN Hugelmeyer, Alexis, DO   2.5 mg at 06/12/17 1604  . atorvastatin (LIPITOR) tablet 40 mg  40 mg Oral Daily Hugelmeyer, Alexis, DO   40 mg at 06/12/17 1718  . azithromycin (ZITHROMAX) tablet 500 mg  500 mg Oral Daily Milagros Loll, MD   500 mg at 06/13/17 1216  . benzonatate (TESSALON) capsule 200 mg  200 mg Oral TID PRN Hugelmeyer, Alexis, DO      . bisacodyl (DULCOLAX) EC tablet 5 mg  5 mg Oral Daily PRN Hugelmeyer, Alexis, DO      . carbamide peroxide (DEBROX) 6.5 % OTIC (EAR) solution 5 drop  5 drop Both EARS BID Milagros Loll, MD   5 drop at 06/13/17 1000  .  cholecalciferol (VITAMIN D) tablet 1,000 Units  1,000 Units Oral Daily Hugelmeyer, Alexis, DO   1,000 Units at 06/13/17 1217  . diltiazem (CARDIZEM CD) 24 hr capsule 240 mg  240 mg Oral Daily Hugelmeyer, Alexis, DO   240 mg at 06/13/17 1216  . enoxaparin (LOVENOX) injection 40 mg  40 mg Subcutaneous Q24H Hugelmeyer, Alexis, DO   40 mg at 06/13/17 0550  . fluticasone furoate-vilanterol (BREO ELLIPTA) 100-25 MCG/INH 1 puff  1 puff Inhalation Daily Hugelmeyer, Alexis, DO   1 puff at 06/13/17 1218  . furosemide (LASIX) tablet 40 mg  40 mg Oral Daily Hugelmeyer, Alexis, DO   40 mg at  06/13/17 1216  . guaiFENesin (MUCINEX) 12 hr tablet 600 mg  600 mg Oral BID PRN Hugelmeyer, Alexis, DO      . hydrALAZINE (APRESOLINE) injection 10 mg  10 mg Intravenous Q6H PRN Sudini, Srikar, MD      . hydrochlorothiazide (MICROZIDE) capsule 12.5 mg  12.5 mg Oral Daily Sudini, Wardell HeathSrikar, MD   12.5 mg at 06/13/17 1216  . insulin aspart (novoLOG) injection 0-15 Units  0-15 Units Subcutaneous TID WC Milagros LollSudini, Srikar, MD   5 Units at 06/13/17 1217  . insulin glargine (LANTUS) injection 10 Units  10 Units Subcutaneous Daily Milagros LollSudini, Srikar, MD   10 Units at 06/13/17 1218  . ipratropium (ATROVENT) nebulizer solution 0.5 mg  0.5 mg Nebulization Q6H PRN Hugelmeyer, Alexis, DO   0.5 mg at 06/12/17 1604  . lisinopril (PRINIVIL,ZESTRIL) tablet 10 mg  10 mg Oral Daily Milagros LollSudini, Srikar, MD   10 mg at 06/13/17 1235  . loratadine (CLARITIN) tablet 10 mg  10 mg Oral Daily Milagros LollSudini, Srikar, MD   10 mg at 06/13/17 1217  . magnesium citrate solution 1 Bottle  1 Bottle Oral Once PRN Hugelmeyer, Alexis, DO      . methylPREDNISolone sodium succinate (SOLU-MEDROL) 125 mg/2 mL injection 60 mg  60 mg Intravenous Q6H Hugelmeyer, Alexis, DO   60 mg at 06/13/17 1219  . ondansetron (ZOFRAN) tablet 4 mg  4 mg Oral Q6H PRN Hugelmeyer, Alexis, DO       Or  . ondansetron (ZOFRAN) injection 4 mg  4 mg Intravenous Q6H PRN Hugelmeyer, Alexis, DO   4 mg at 06/09/17 2339  . oxybutynin (DITROPAN) tablet 5 mg  5 mg Oral Daily Hugelmeyer, Alexis, DO   5 mg at 06/13/17 1217  . pantoprazole (PROTONIX) EC tablet 40 mg  40 mg Oral Daily Hugelmeyer, Alexis, DO   40 mg at 06/13/17 1217  . PARoxetine (PAXIL) tablet 20 mg  20 mg Oral Daily Hugelmeyer, Alexis, DO   20 mg at 06/13/17 1218  . senna-docusate (Senokot-S) tablet 1 tablet  1 tablet Oral QHS PRN Hugelmeyer, Alexis, DO      . sodium chloride flush (NS) 0.9 % injection 3 mL  3 mL Intravenous Q12H Hugelmeyer, Alexis, DO   3 mL at 06/13/17 1110  . tiotropium [facility](SPIRIVA) inhalation capsule 18 mcg  1  capsule Inhalation Daily Hugelmeyer, Alexis, DO   18 mcg at 06/13/17 1219  . vitamin B-12 (CYANOCOBALAMIN) tablet 2,000 mcg  2,000 mcg Oral Daily Hugelmeyer, Alexis, DO   2,000 mcg at 06/13/17 1216  . vitamin C (ASCORBIC ACID) tablet 1,000 mg  1,000 mg Oral Daily Hugelmeyer, Alexis, DO   1,000 mg at 06/13/17 1218     Discharge Medications: Please see discharge summary for a list of discharge medications.  Relevant Imaging Results:  Relevant Lab Results:   Additional Information  SSN [id number]  Darleene Cleaver, LCSWA

## 2017-06-13 NOTE — Plan of Care (Signed)
Problem: Safety: Goal: Ability to remain free from injury will improve Outcome: Progressing Educated pt on safety. Bed alarm on, call bell and telephone within reach, yellow socks on, and bed in lowest position. Pt verbalized understanding.

## 2017-06-13 NOTE — Clinical Social Work Note (Signed)
CSW presented bed offers to patient and her daughter and she chose [facility].  CSW contacted [facility] who will begin insurance approval.  CSW to continue to follow patient's progress throughout discharge planning.  Audrey KnackEric R. Jakyla Sexton, MSW, Audrey MajorsLCSWA [phone]  06/13/2017 5:05 PM

## 2017-06-14 ENCOUNTER — Encounter
Admission: RE | Admit: 2017-06-14 | Discharge: 2017-06-14 | Disposition: A | Discharge status: Home / Self Care | Payer: Medicare HMO | Source: Ambulatory Visit | Attending: Internal Medicine | Admitting: Internal Medicine

## 2017-06-14 ENCOUNTER — Encounter: Payer: Self-pay | Admitting: *Deleted

## 2017-06-14 LAB — BASIC METABOLIC PANEL
ANION GAP: 17 — AB (ref 5–15)
BUN: 51 mg/dL — ABNORMAL HIGH (ref 6–20)
CALCIUM: 8.9 mg/dL (ref 8.9–10.3)
CO2: 30 mmol/L (ref 22–32)
CREATININE: 1.57 mg/dL — AB (ref 0.44–1.00)
Chloride: 90 mmol/L — ABNORMAL LOW (ref 101–111)
GFR, EST AFRICAN AMERICAN: 38 mL/min — AB (ref >60)
GFR, EST NON AFRICAN AMERICAN: 33 mL/min — AB (ref >60)
Glucose, Bld: 326 mg/dL — ABNORMAL HIGH (ref 65–99)
Potassium: 2.7 mmol/L — CL (ref 3.5–5.1)
SODIUM: 137 mmol/L (ref 135–145)

## 2017-06-14 LAB — GLUCOSE, CAPILLARY
GLUCOSE-CAPILLARY: 281 mg/dL — AB (ref 65–99)
GLUCOSE-CAPILLARY: 334 mg/dL — AB (ref 65–99)
GLUCOSE-CAPILLARY: 257 mg/dL — AB (ref 65–99)
GLUCOSE-CAPILLARY: 213 mg/dL — AB (ref 65–99)

## 2017-06-14 MED ORDER — POTASSIUM CHLORIDE CRYS ER 20 MEQ PO TBCR
40.0000 meq | EXTENDED_RELEASE_TABLET | Freq: Once | ORAL | Status: AC
  Administered 2017-06-14: 40 meq via ORAL
  Filled 2017-06-14: qty 2

## 2017-06-14 MED ORDER — LISINOPRIL-HYDROCHLOROTHIAZIDE 10-12.5 MG PO TABS: 1.0000 | ORAL_TABLET | Freq: Every day | ORAL | Status: DC

## 2017-06-14 MED ORDER — PREDNISONE 10 MG (21) PO TBPK: ORAL_TABLET | ORAL | 0 refills | Status: DC

## 2017-06-14 MED ORDER — METHYLPREDNISOLONE SODIUM SUCC 125 MG IJ SOLR
60.0000 mg | Freq: Two times a day (BID) | INTRAMUSCULAR | Status: DC
  Administered 2017-06-14 – 2017-06-15 (×2): 60 mg via INTRAVENOUS
  Filled 2017-06-14 (×2): qty 2

## 2017-06-14 MED ORDER — INSULIN GLARGINE 100 UNIT/ML ~~LOC~~ SOLN
15.0000 [IU] | Freq: Every day | SUBCUTANEOUS | Status: AC
  Administered 2017-06-15: 15 [IU] via SUBCUTANEOUS
  Filled 2017-06-14: qty 0.15

## 2017-06-14 MED ORDER — FLUTICASONE FUROATE-VILANTEROL 100-25 MCG/INH IN AEPB: 1.0000 | INHALATION_SPRAY | Freq: Every day | RESPIRATORY_TRACT | Status: AC

## 2017-06-14 NOTE — Evaluation (Signed)
Occupational Therapy Evaluation Patient Details Name: Audrey Sexton MRN: [id number] DOB: [birth year] Today's Date: 06/14/2017    History of Present Illness  66 y.o. 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  Admitted with acute respiratory failure.   Clinical Impression   Pt is 66 year old female who [facility] to [facility] hospital acute respiratory failure with hypoxia with history of COPD.  Pt currently requires min guard to min assist for ADLs due to SOB, decreased endurance for functional tasks, impaired balance, and at risk for falls.  Pt would benefit from skilled OT services to increase independence in ADLs, additional education/training in energy conservation techniques, AE/DME, pursed lip breathing and recommendations for home modifications to increase safety and prevent falls in order to maximize return to PLOF. Recommend transition to STR prior to return home given pt's functional status in order to minimize risk of falls, injury, and rehospitalization.    Follow Up Recommendations  SNF    Equipment Recommendations  Other (comment) (reacher, LH sponge, HH shower head)    Recommendations for Other Services       Precautions / Restrictions Precautions Precautions: Fall Restrictions Weight Bearing Restrictions: No      Mobility Bed Mobility Overal bed mobility: Independent                Transfers Overall transfer level: Needs assistance Equipment used: Straight cane Transfers: Sit to/from Stand Sit to Stand: Supervision;Min guard              Balance Overall balance assessment: Needs assistance Sitting-balance support: No upper extremity supported Sitting balance-Leahy Scale: Good     Standing balance support: During functional activity;Bilateral upper extremity supported Standing balance-Leahy Scale: Poor                              ADL either performed or assessed with clinical judgement   ADL Overall ADL's : Needs assistance/impaired   Eating/Feeding Details (indicate cue type and reason): encouraged pt to take her time, small bites, breathe between bites to minimize SOB Grooming: Sitting;Set up   Upper Body Bathing: Sitting;Set up   Lower Body Bathing: Sitting/lateral leans;Set up Lower Body Bathing Details (indicate cue type and reason): educated in AE for LB bathing to minimize need to bend over, benefits of seated shower, set up with LH sponge would improve independence and safety Upper Body Dressing : Sitting;Set up   Lower Body Dressing: Sitting/lateral leans;Sit to/from stand Lower Body Dressing Details (indicate cue type and reason): pt educated in use of AE for LB dressing to minimize need to bend over and maximize safety and functional independence Toilet Transfer: Ambulation;Minimal assistance;Comfort height toilet Toilet Transfer Details (indicate cue type and reason): SPC         Functional mobility during ADLs: Cane;Minimal assistance General ADL Comments: VC for pursed lip breathing     Vision Baseline Vision/History: Wears glasses Wears Glasses: At all times Patient Visual Report: No change from baseline Vision Assessment?: No apparent visual deficits     Perception     Praxis      Pertinent Vitals/Pain Pain Assessment: 0-10 Pain Score: 8  Pain Location: L knee (chronic) Pain Descriptors / Indicators: Aching Pain Intervention(s): Limited activity within patient's tolerance;Monitored during session     Hand Dominance Right   Extremity/Trunk Assessment Upper Extremity Assessment Upper  Extremity Assessment: Overall WFL for tasks assessed   Lower Extremity Assessment Lower Extremity Assessment: Overall WFL for tasks assessed   Cervical / Trunk Assessment Cervical / Trunk Assessment: Normal   Communication Communication Communication: No difficulties    Cognition Arousal/Alertness: Awake/alert Behavior During Therapy: WFL for tasks assessed/performed Overall Cognitive Status: Within Functional Limits for tasks assessed                                     General Comments       Exercises Other Exercises Other Exercises: pt educated in energy conservation strategies including AE/DME, home/routines modifications, work simplification strategies, falls prevention, and pursed lip breathing to support maximizing safety and functional independence as well as minimizing SOB   Shoulder Instructions      Home Living Family/patient expects to be discharged to:: Private residence Living Arrangements: Alone Available Help at Discharge: Family;Available PRN/intermittently (dtr lives close by, available PRN) Type of Home: Apartment Home Access: Stairs to enter Entergy CorporationEntrance Stairs-Number of Steps: 2 small steps Entrance Stairs-Rails: None Home Layout: One level     Bathroom Shower/Tub: Chief Strategy OfficerTub/shower unit   Bathroom Toilet: Handicapped height     Home Equipment: Production assistant, radiohower seat;Cane - single point;Grab bars - tub/shower          Prior Functioning/Environment Level of Independence: Needs assistance  Gait / Transfers Assistance Needed: no AD to ambulate, limited community distances ADL's / Homemaking Assistance Needed: indep with ADL, daughter drives and brings groceries, pt receives meals on wheels; enjoys spending time with grandkids   Comments: Pt on 3 liters O2 at home, 0 falls in past 12 months        OT Problem List: Cardiopulmonary status limiting activity;Decreased activity tolerance;Decreased knowledge of use of DME or AE;Impaired balance (sitting and/or standing)      OT Treatment/Interventions: Self-care/ADL training;Therapeutic exercise;Therapeutic activities;Energy conservation;DME and/or AE instruction;Patient/family education;Balance training    OT Goals(Current goals can be found in the care plan section) Acute  Rehab OT Goals Patient Stated Goal: feel better OT Goal Formulation: With patient Time For Goal Achievement: 06/28/17 Potential to Achieve Goals: Good  OT Frequency: Min 2X/week   Barriers to D/C: Decreased caregiver support          Co-evaluation              AM-PAC PT "6 Clicks" Daily Activity     Outcome Measure Help from another person eating meals?: A Little Help from another person taking care of personal grooming?: A Little Help from another person toileting, which includes using toliet, bedpan, or urinal?: A Little Help from another person bathing (including washing, rinsing, drying)?: A Little Help from another person to put on and taking off regular upper body clothing?: A Little Help from another person to put on and taking off regular lower body clothing?: A Little 6 Click Score: 18   End of Session    Activity Tolerance: Patient tolerated treatment well Patient left: in bed;with call bell/phone within reach  OT Visit Diagnosis: Other abnormalities of gait and mobility (R26.89)                Time: [id number] OT Time Calculation (min): 31 min Charges:  OT General Charges $OT Visit: 1 Visit OT Evaluation $OT Eval Low Complexity: 1 Low OT Treatments $Self Care/Home Management : 23-37 mins G-Codes: OT G-codes **NOT FOR INPATIENT CLASS** Functional Assessment Tool Used: AM-PAC 6 Clicks Daily  Activity;Clinical judgement Functional Limitation: Self care Self Care Current Status [phone]): At least 40 percent but less than 60 percent impaired, limited or restricted Self Care Goal Status (U0454): At least 20 percent but less than 40 percent impaired, limited or restricted   Richrd Prime, MPH, MS, OTR/L ascom [phone] 06/14/17, 9:25 AM

## 2017-06-14 NOTE — Progress Notes (Signed)
Physical Therapy Treatment Patient Details Name: Audrey Sexton MRN: [id number] DOB: [birth year] Today's Date: 06/14/2017    History of Present Illness  66 y.o. 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  Admitted with acute respiratory failure.    PT Comments    Pt was willing to try walking with walker today and realized how much safer she was with it.  Pt still wanting to get back to baseline w/o AD, but acknowledges that for now she should use it.  Pt on 3 liters O2 t/o the session and though PT gave constant reminders/cuing for pursed lip breathing she was still fatigued and O2 drop to mid 80s with the limited ambulation effort.  Pt is not at baseline and would benefit from stint in STR to get back to her PLOF and safely be able to return home.   Follow Up Recommendations  SNF     Equipment Recommendations  Rolling walker with 5" wheels    Recommendations for Other Services       Precautions / Restrictions Precautions Precautions: Fall Restrictions Weight Bearing Restrictions: No    Mobility  Bed Mobility               General bed mobility comments: not tested, pt in recliner on arrival  Transfers Overall transfer level: Needs assistance Equipment used: Rolling walker (2 wheeled) Transfers: Sit to/from Stand Sit to Stand: Supervision         General transfer comment: Pt needed light reminders for set up and sequencing, she did much better on intially maintaining balance with walker than on previous attempts w/ less AD support  Ambulation/Gait Ambulation/Gait assistance: Min assist Ambulation Distance (Feet): 65 Feet Assistive device: Rolling walker (2 wheeled)       General Gait Details: Pt with much better cadence, safety and efficiency with walker (as opposed to cane and HHA of previous bouts of ambulation.)  She was still  hesitant and fatigued quickly with O2 dropping to mid 80s on 3 liters and dyspnea/wheezing.   Stairs            Wheelchair Mobility    Modified Rankin (Stroke Patients Only)       Balance Overall balance assessment: Needs assistance           Standing balance-Leahy Scale: Fair                              Cognition Arousal/Alertness: Awake/alert Behavior During Therapy: WFL for tasks assessed/performed Overall Cognitive Status: Within Functional Limits for tasks assessed                                        Exercises General Exercises - Lower Extremity Ankle Circles/Pumps: AROM;10 reps Long Arc Quad: Strengthening;10 reps Heel Slides: Strengthening;10 reps Hip ABduction/ADduction: Strengthening;10 reps Hip Flexion/Marching: Strengthening;10 reps    General Comments        Pertinent Vitals/Pain Pain Assessment: No/denies pain Pain Score: 4  Pain Location: L knee (chronic)    Home Living Family/patient expects to be discharged to:: Skilled nursing facility                    Prior Function  PT Goals (current goals can now be found in the care plan section) Progress towards PT goals: Progressing toward goals    Frequency    Min 2X/week      PT Plan Current plan remains appropriate    Co-evaluation              AM-PAC PT "6 Clicks" Daily Activity  Outcome Measure  Difficulty turning over in bed (including adjusting bedclothes, sheets and blankets)?: None Difficulty moving from lying on back to sitting on the side of the bed? : None Difficulty sitting down on and standing up from a chair with arms (e.g., wheelchair, bedside commode, etc,.)?: None Help needed moving to and from a bed to chair (including a wheelchair)?: A Little Help needed walking in hospital room?: A Little Help needed climbing 3-5 steps with a railing? : A Lot 6 Click Score: 20    End of Session Equipment Utilized  During Treatment: Gait belt;Oxygen (3 liters) Activity Tolerance: Patient tolerated treatment well Patient left: in chair;with call bell/phone within reach   PT Visit Diagnosis: Muscle weakness (generalized) (M62.81);Difficulty in walking, not elsewhere classified (R26.2)     Time: 1610-9604 PT Time Calculation (min) (ACUTE ONLY): 26 min  Charges:  $Gait Training: 8-22 mins $Therapeutic Exercise: 8-22 mins                    G Codes:       Malachi Pro, DPT 06/14/2017, 3:33 PM

## 2017-06-14 NOTE — Progress Notes (Signed)
[facility] - [place] at [facility]   PATIENT NAME: Audrey Sexton    MR#:  [id number]  DATE OF BIRTH:  Dec 28, 1950  SUBJECTIVE:  CHIEF COMPLAINT:   Chief Complaint  Patient presents with  . Shortness of Breath   SOB and wheezing persisitent. Very slow improvement. BS >300  REVIEW OF SYSTEMS:    Review of Systems  Constitutional: Positive for malaise/fatigue. Negative for chills and fever.  HENT: Negative for sore throat.   Eyes: Negative for blurred vision, double vision and pain.  Respiratory: Positive for cough, sputum production, shortness of breath and wheezing. Negative for hemoptysis.   Cardiovascular: Negative for chest pain, palpitations, orthopnea and leg swelling.  Gastrointestinal: Negative for abdominal pain, constipation, diarrhea, heartburn, nausea and vomiting.  Genitourinary: Negative for dysuria and hematuria.  Musculoskeletal: Negative for back pain and joint pain.  Skin: Negative for rash.  Neurological: Positive for weakness. Negative for sensory change, speech change, focal weakness and headaches.  Endo/Heme/Allergies: Does not bruise/bleed easily.  Psychiatric/Behavioral: Negative for depression. The patient is not nervous/anxious.     DRUG ALLERGIES:   Allergies  Allergen Reactions  . Percocet [Oxycodone-Acetaminophen] Nausea And Vomiting  . Vicodin [Hydrocodone-Acetaminophen] Nausea And Vomiting  . Penicillins Hives    Has patient had a PCN reaction causing immediate rash, facial/tongue/throat swelling, SOB or lightheadedness with hypotension: no Has patient had a PCN reaction causing severe rash involving mucus membranes or skin necrosis: no Has patient had a PCN reaction that required hospitalization  no Has patient had a PCN reaction occurring within the last 10 years: no If all of the above answers are "NO", then may proceed with Cephalosporin use.     VITALS:  Blood pressure (!) 151/59, pulse 88, temperature (!) 97.4 F  (36.3 C), temperature source Oral, resp. rate 18, height 5\' 5"  (1.651 m), weight 95.8 kg (211 lb 1.6 oz), SpO2 92 %.  PHYSICAL EXAMINATION:   Physical Exam  GENERAL:  66 y.o.-year-old patient lying in the bed with no acute distress.  EYES: Pupils equal, round, reactive to light and accommodation. No scleral icterus. Extraocular muscles intact.  HEENT: Head atraumatic, normocephalic. Oropharynx and nasopharynx clear.  NECK:  Supple, no jugular venous distention. No thyroid enlargement, no tenderness.  LUNGS: decreased air entry bilaterally with expiratory wheezing CARDIOVASCULAR: S1, S2 normal. No murmurs, rubs, or gallops.  ABDOMEN: Soft, nontender, nondistended. Bowel sounds present. No organomegaly or mass.  EXTREMITIES: No cyanosis, clubbing or edema b/l.    NEUROLOGIC: Cranial nerves II through XII are intact. No focal Motor or sensory deficits b/l.   PSYCHIATRIC: The patient is alert and oriented x 3.  SKIN: No obvious rash, lesion, or ulcer.   LABORATORY PANEL:   CBC  Recent Labs Lab 06/10/17 0417  WBC 10.1  HGB 12.9  HCT 39.0  PLT 234   ------------------------------------------------------------------------------------------------------------------ Chemistries   Recent Labs Lab 06/09/17 1931 06/10/17 0417  NA 139 142  K 3.8 3.9  CL 104 106  CO2 27 27  GLUCOSE 112* 226*  BUN 21* 20  CREATININE 0.92 0.99  CALCIUM 9.4 9.4  AST 49*  --   ALT 41  --   ALKPHOS 54  --   BILITOT 0.5  --    ------------------------------------------------------------------------------------------------------------------  Cardiac Enzymes  Recent Labs Lab 06/09/17 1931  TROPONINI <0.03   ------------------------------------------------------------------------------------------------------------------  RADIOLOGY:  No results found.   ASSESSMENT AND PLAN:  * Hyperglycemia due to IV steroids - BS still significantly high Add SSI Added lantus.  Increase to 15 units  *  Acute COPD exacerbation with acute on chronic respiratory failure- still has wheezing and SOB -IV steroids - Scheduled Nebulizers - Inhalers -Wean O2 as tolerated  * Hypertension. Continue home medications. Uncontrolled.  Added HCTZ and Lisinopril  All the records are reviewed and case discussed with Care Management/Social Workerr. Management plans discussed with the patient, family and they are in agreement.  CODE STATUS: FULL CODE  DVT Prophylaxis: SCDs  TOTAL TIME TAKING CARE OF THIS PATIENT: 30 minutes.   POSSIBLE D/C IN 1-2 DAYS, DEPENDING ON CLINICAL CONDITION.  Milagros LollSudini, Verle Brillhart R M.D on 06/14/2017 at 11:39 AM  Between 7am to 6pm - Pager - [phone]  After 6pm go to [email or web address] - password [email or web address] [facility]  [facility] [place] [facility]  Office  [phone]  CC: Primary care physician; Oswaldo ConroyBender, Abby Daneele, MD  Note: This dictation was prepared with Dragon dictation along with smaller phrase technology. Any transcriptional errors that result from this process are unintentional.

## 2017-06-14 NOTE — Discharge Summary (Addendum)
[facility] - [place] at [facility]   PATIENT NAME: Audrey Sexton    MR#:  [id number]  DATE OF BIRTH:  24-Apr-1951  DATE OF ADMISSION:  06/09/2017 ADMITTING PHYSICIAN: Tonye Royalty, DO  DATE OF DISCHARGE: 06/15/2017  PRIMARY CARE PHYSICIAN: Oswaldo Conroy, MD   ADMISSION DIAGNOSIS:  Respiratory distress [R06.03] COPD exacerbation (HCC) [J44.1] Acute respiratory failure with hypoxia (HCC) [J96.01]  DISCHARGE DIAGNOSIS:  Active Problems:   Acute respiratory failure with hypoxia (HCC)   SECONDARY DIAGNOSIS:   Past Medical History:  Diagnosis Date  . Asthma   . Carpal tunnel syndrome of left wrist   . COPD (chronic obstructive pulmonary disease) (HCC)   . Hyperlipemia   . Hypertension      ADMITTING HISTORY  HPI: ArleneGravesis a 66 y.o.femalewith a known history of asthma, home O2 dependent COPD on 3L, HTN, HLDpresents to the emergency department for evaluation of SOB.Patient was in a usual state of health until about 3 days agoscribes progressively worsening wheezing, chest tightness and difficulty breathing. She did increase her home O2 with no significant improvement..shortness of breath is associated with a nonproductive cough.  Patient denies fevers/chills, weakness, dizziness, chest pain,  N/V/C/D, abdominal pain, dysuria/frequency, changes in mental status.   Otherwise there has been no change in status. Patient has been taking medication as prescribed and there has been no recent change in medication or diet. No recent antibiotics. There has been no recent illness, hospitalizations, travel or sick contacts.    HOSPITAL COURSE:     * Hyperglycemia due to IV steroids Add SSI Added lantus in the hospital.  * Acute COPD exacerbation with acute on chronic respiratory failure- still has wheezing and SOB -IV steroids--> change to PO prednisone - Scheduled Nebulizers - Inhalers -Wears 3 L o2 at home  * Hypertension.  Continue home medications. Uncontrolled.  Added HCTZ and Lisinopril  Stable for discharge to SNF for PT CONSULTS OBTAINED:    DRUG ALLERGIES:   Allergies  Allergen Reactions  . Percocet [Oxycodone-Acetaminophen] Nausea And Vomiting  . Vicodin [Hydrocodone-Acetaminophen] Nausea And Vomiting  . Penicillins Hives    Has patient had a PCN reaction causing immediate rash, facial/tongue/throat swelling, SOB or lightheadedness with hypotension: no Has patient had a PCN reaction causing severe rash involving mucus membranes or skin necrosis: no Has patient had a PCN reaction that required hospitalization  no Has patient had a PCN reaction occurring within the last 10 years: no If all of the above answers are "NO", then may proceed with Cephalosporin use.     DISCHARGE MEDICATIONS:   Current Discharge Medication List    START taking these medications   Details  lisinopril-hydrochlorothiazide (ZESTORETIC) 10-12.5 MG tablet Take 1 tablet by mouth daily.    potassium chloride SA (K-DUR,KLOR-CON) 20 MEQ tablet Take 1 tablet (20 mEq total) by mouth daily.    predniSONE (STERAPRED UNI-PAK 21 TAB) 10 MG (21) TBPK tablet 60 mg day 1 and taper 10 mg daily Qty: 21 tablet, Refills: 0      CONTINUE these medications which have CHANGED   Details  fluticasone furoate-vilanterol (BREO ELLIPTA) 100-25 MCG/INH AEPB Inhale 1 puff into the lungs daily.      CONTINUE these medications which have NOT CHANGED   Details  albuterol (ACCUNEB) 0.63 MG/3ML nebulizer solution Take 3 mLs by nebulization every 4 (four) hours as needed. Qty: 75 mL, Refills: 3    albuterol (PROVENTIL HFA;VENTOLIN HFA) 108 (90 Base) MCG/ACT inhaler Inhale 2-4 puffs by mouth  every 4 hours as needed for wheezing, cough, and/or shortness of breath Qty: 1 Inhaler, Refills: 1    Ascorbic Acid (VITAMIN C) 1000 MG tablet Take 1,000 mg by mouth daily.    atorvastatin (LIPITOR) 40 MG tablet Take 40 mg by mouth daily.     Cholecalciferol (VITAMIN D-1000 MAX ST) 1000 units tablet Take 1,000 Units by mouth daily.    diltiazem (CARDIZEM CD) 240 MG 24 hr capsule Take 240 mg by mouth daily.    furosemide (LASIX) 40 MG tablet Take 40 mg by mouth daily.    Multiple Vitamin (MULTI-VITAMIN DAILY PO) Take 1 tablet by mouth daily.    oxybutynin (DITROPAN) 5 MG tablet Take 5 mg by mouth daily.    PARoxetine (PAXIL) 20 MG tablet Take 20 mg by mouth daily.    tiotropium (SPIRIVA) 18 MCG inhalation capsule Place 1 capsule into inhaler and inhale daily.    vitamin B-12 (CYANOCOBALAMIN) 1000 MCG tablet Take 1,000 mcg by mouth daily.     Acetaminophen 500 MG coapsule Take 500 mg by mouth as needed.    hydrocortisone 2.5 % cream Apply 1 application topically as needed.      STOP taking these medications     benzonatate (TESSALON) 200 MG capsule      omeprazole (PRILOSEC) 20 MG capsule         Today   VITAL SIGNS:  Blood pressure (!) 145/78, pulse 73, temperature 97.6 F (36.4 C), temperature source Oral, resp. rate 18, height 5\' 5"  (1.651 m), weight 95.8 kg (211 lb 1.6 oz), SpO2 98 %.  I/O:    Intake/Output Summary (Last 24 hours) at 06/15/17 1055 Last data filed at 06/15/17 0955  Gross per 24 hour  Intake             1440 ml  Output              800 ml  Net              640 ml    PHYSICAL EXAMINATION:  Physical Exam  GENERAL:  66 y.o.-year-old patient lying in the bed with no acute distress.  LUNGS: Bilateral exp wheezing CARDIOVASCULAR: S1, S2 normal. No murmurs, rubs, or gallops.  ABDOMEN: Soft, non-tender, non-distended. Bowel sounds present. No organomegaly or mass.  NEUROLOGIC: Moves all 4 extremities. PSYCHIATRIC: The patient is alert and oriented x 3.  SKIN: No obvious rash, lesion, or ulcer.   DATA REVIEW:   CBC  Recent Labs Lab 06/10/17 0417  WBC 10.1  HGB 12.9  HCT 39.0  PLT 234    Chemistries   Recent Labs Lab 06/09/17 1931  06/14/17 1324  NA 139  < > 137  K 3.8   < > 2.7*  CL 104  < > 90*  CO2 27  < > 30  GLUCOSE 112*  < > 326*  BUN 21*  < > 51*  CREATININE 0.92  < > 1.57*  CALCIUM 9.4  < > 8.9  AST 49*  --   --   ALT 41  --   --   ALKPHOS 54  --   --   BILITOT 0.5  --   --   < > = values in this interval not displayed.  Cardiac Enzymes  Recent Labs Lab 06/09/17 1931  TROPONINI <0.03    Microbiology Results  Results for orders placed or performed in visit on 06/13/14  Culture, blood (single)     Status: None   Collection  Time: 06/13/14  4:00 PM  Result Value Ref Range Status   Micro Text Report   Final       COMMENT                   NO GROWTH AEROBICALLY/ANAEROBICALLY IN 5 DAYS   ANTIBIOTIC                                                      Culture, blood (single)     Status: None   Collection Time: 06/13/14  4:03 PM  Result Value Ref Range Status   Micro Text Report   Final       COMMENT                   NO GROWTH AEROBICALLY/ANAEROBICALLY IN 5 DAYS   ANTIBIOTIC                                                      Culture, expectorated sputum-assessment     Status: None   Collection Time: 06/14/14  1:20 AM  Result Value Ref Range Status   Micro Text Report   Final       ORGANISM 1                LIGHT GROWTH STAPHYLOCOCCUS AUREUS   COMMENT                   -   GRAM STAIN                MANY WHITE BLOOD CELLS   GRAM STAIN                MANY GRAM POSITIVE COCCI IN PAIRS IN CHAINS   GRAM STAIN                EXCELLENT SPECIMEN-90-100% WBC   ANTIBIOTIC                    ORG#1     CIPROFLOXACIN                 S         CLINDAMYCIN                   S         ERYTHROMYCIN                  S         GENTAMICIN                    S         LEVOFLOXACIN                  S         LINEZOLID                     S         OXACILLIN                     S  TIGECYCLINE                   S         CEFOXITIN SCREEN              NEGATIVE  INDUCIBLE CLINDAMYCIN RESISTANNEGATIVE  TRIMETHOPRIM/SULFAMETHOXAZOLE S            Culture, fungus without smear     Status: None   Collection Time: 06/24/14  9:45 AM  Result Value Ref Range Status   Micro Text Report   Final       SOURCE: BRONCH WASHING    ORGANISM 1                CANDIDA ALBICANS   COMMENT                   -   ANTIBIOTIC                    ORG#1    ORG#2                                                          RADIOLOGY:  No results found.  Follow up with PCP in 1 week.  Management plans discussed with the patient, family and they are in agreement.  CODE STATUS:  Code Status History    Date Active Date Inactive Code Status Order ID Comments User Context   08/18/2016  1:30 AM 08/24/2016  7:26 PM Full Code [id number]  Hugelmeyer, Jon Gills, DO Inpatient    Advance Directive Documentation     Most Recent Value  Type of Advance Directive  Living will  Pre-existing out of facility DNR order (yellow form or pink MOST form)  -  "MOST" Form in Place?  -      TOTAL TIME TAKING CARE OF THIS PATIENT ON DAY OF DISCHARGE: more than 30 minutes.   Milagros Loll R M.D on 06/15/2017 at 10:55 AM  Between 7am to 6pm - Pager - [phone]  After 6pm go to [email or web address] - password [email or web address] [facility]  [facility] [place] [facility]  Office  [phone]  CC: Primary care physician; Oswaldo Conroy, MD  Note: This dictation was prepared with Dragon dictation along with smaller phrase technology. Any transcriptional errors that result from this process are unintentional.

## 2017-06-15 LAB — BASIC METABOLIC PANEL
Anion gap: 14 (ref 5–15)
BUN: 58 mg/dL — AB (ref 6–20)
CO2: 33 mmol/L — ABNORMAL HIGH (ref 22–32)
CREATININE: 1.23 mg/dL — AB (ref 0.44–1.00)
Calcium: 9 mg/dL (ref 8.9–10.3)
Chloride: 92 mmol/L — ABNORMAL LOW (ref 101–111)
GFR calc Af Amer: 51 mL/min — ABNORMAL LOW (ref >60)
GFR, EST NON AFRICAN AMERICAN: 44 mL/min — AB (ref >60)
Glucose, Bld: 240 mg/dL — ABNORMAL HIGH (ref 65–99)
Potassium: 3.2 mmol/L — ABNORMAL LOW (ref 3.5–5.1)
SODIUM: 139 mmol/L (ref 135–145)

## 2017-06-15 LAB — GLUCOSE, CAPILLARY
GLUCOSE-CAPILLARY: 252 mg/dL — AB (ref 65–99)
Glucose-Capillary: 286 mg/dL — ABNORMAL HIGH (ref 65–99)
Glucose-Capillary: 134 mg/dL — ABNORMAL HIGH (ref 65–99)

## 2017-06-15 MED ORDER — POTASSIUM CHLORIDE CRYS ER 20 MEQ PO TBCR
40.0000 meq | EXTENDED_RELEASE_TABLET | ORAL | Status: AC
  Administered 2017-06-15 (×2): 40 meq via ORAL
  Filled 2017-06-15 (×2): qty 2

## 2017-06-15 MED ORDER — POTASSIUM CHLORIDE CRYS ER 20 MEQ PO TBCR: 20.0000 meq | EXTENDED_RELEASE_TABLET | Freq: Every day | ORAL | Status: DC

## 2017-06-15 MED ORDER — PREDNISONE 10 MG (21) PO TBPK: ORAL_TABLET | ORAL | 0 refills | Status: DC

## 2017-06-15 MED ORDER — LISINOPRIL-HYDROCHLOROTHIAZIDE 10-12.5 MG PO TABS: 1.0000 | ORAL_TABLET | Freq: Every day | ORAL | 0 refills | Status: DC

## 2017-06-15 MED ORDER — POTASSIUM CHLORIDE CRYS ER 20 MEQ PO TBCR
40.0000 meq | EXTENDED_RELEASE_TABLET | Freq: Once | ORAL | Status: AC
  Administered 2017-06-15: 40 meq via ORAL
  Filled 2017-06-15: qty 2

## 2017-06-15 MED ORDER — POTASSIUM CHLORIDE CRYS ER 20 MEQ PO TBCR: 20.0000 meq | EXTENDED_RELEASE_TABLET | Freq: Every day | ORAL | 0 refills | Status: AC

## 2017-06-15 NOTE — Clinical Social Work Note (Addendum)
CSW received referral for SNF patient's insurance company has denied patient.  CSW met with patient and discussed if she wanted the physician to appeal the discharge, and patient said no she would rather go home with home health.   CSW notified physician and case manager, and plan is to discharge home with home health.  CSW to sign off please re-consult if social work needs arise.  Jones Broom. Wallace, MSW, [place]

## 2017-06-15 NOTE — Progress Notes (Signed)
Pt taken to car by wheelchair with home oxygen. Into car with daughter.

## 2017-06-15 NOTE — Progress Notes (Signed)
Pt to be discharged this evening. Iv and tele removed. disch instructions and prescrips given to pt to her understanding. disch home with h.h. Accompanied by family member

## 2017-06-15 NOTE — Care Management (Signed)
Discharge to home today per Dr. Elpidio AnisSudini. Will be followed by [facility] for skilled nursing, physical therapy, and occupational therapy.Sherolyn Buba. Crystal Leonard RN representative for [facility] updated. Gwenette GreetBrenda S Ormand Senn RN MSN CCM Care Management [phone]

## 2018-03-04 ENCOUNTER — Other Ambulatory Visit: Payer: Self-pay | Admitting: Internal Medicine

## 2018-03-04 DIAGNOSIS — R221 Localized swelling, mass and lump, neck: Secondary | ICD-10-CM

## 2018-03-18 IMAGING — CR DG CHEST 2V
2 series · 2 of 2 positions shown · non-contrast
Comparison: 06/24/2014

CLINICAL DATA: Acute onset dyspnea tonight

EXAM:
CHEST  2 VIEW

[chest lat]
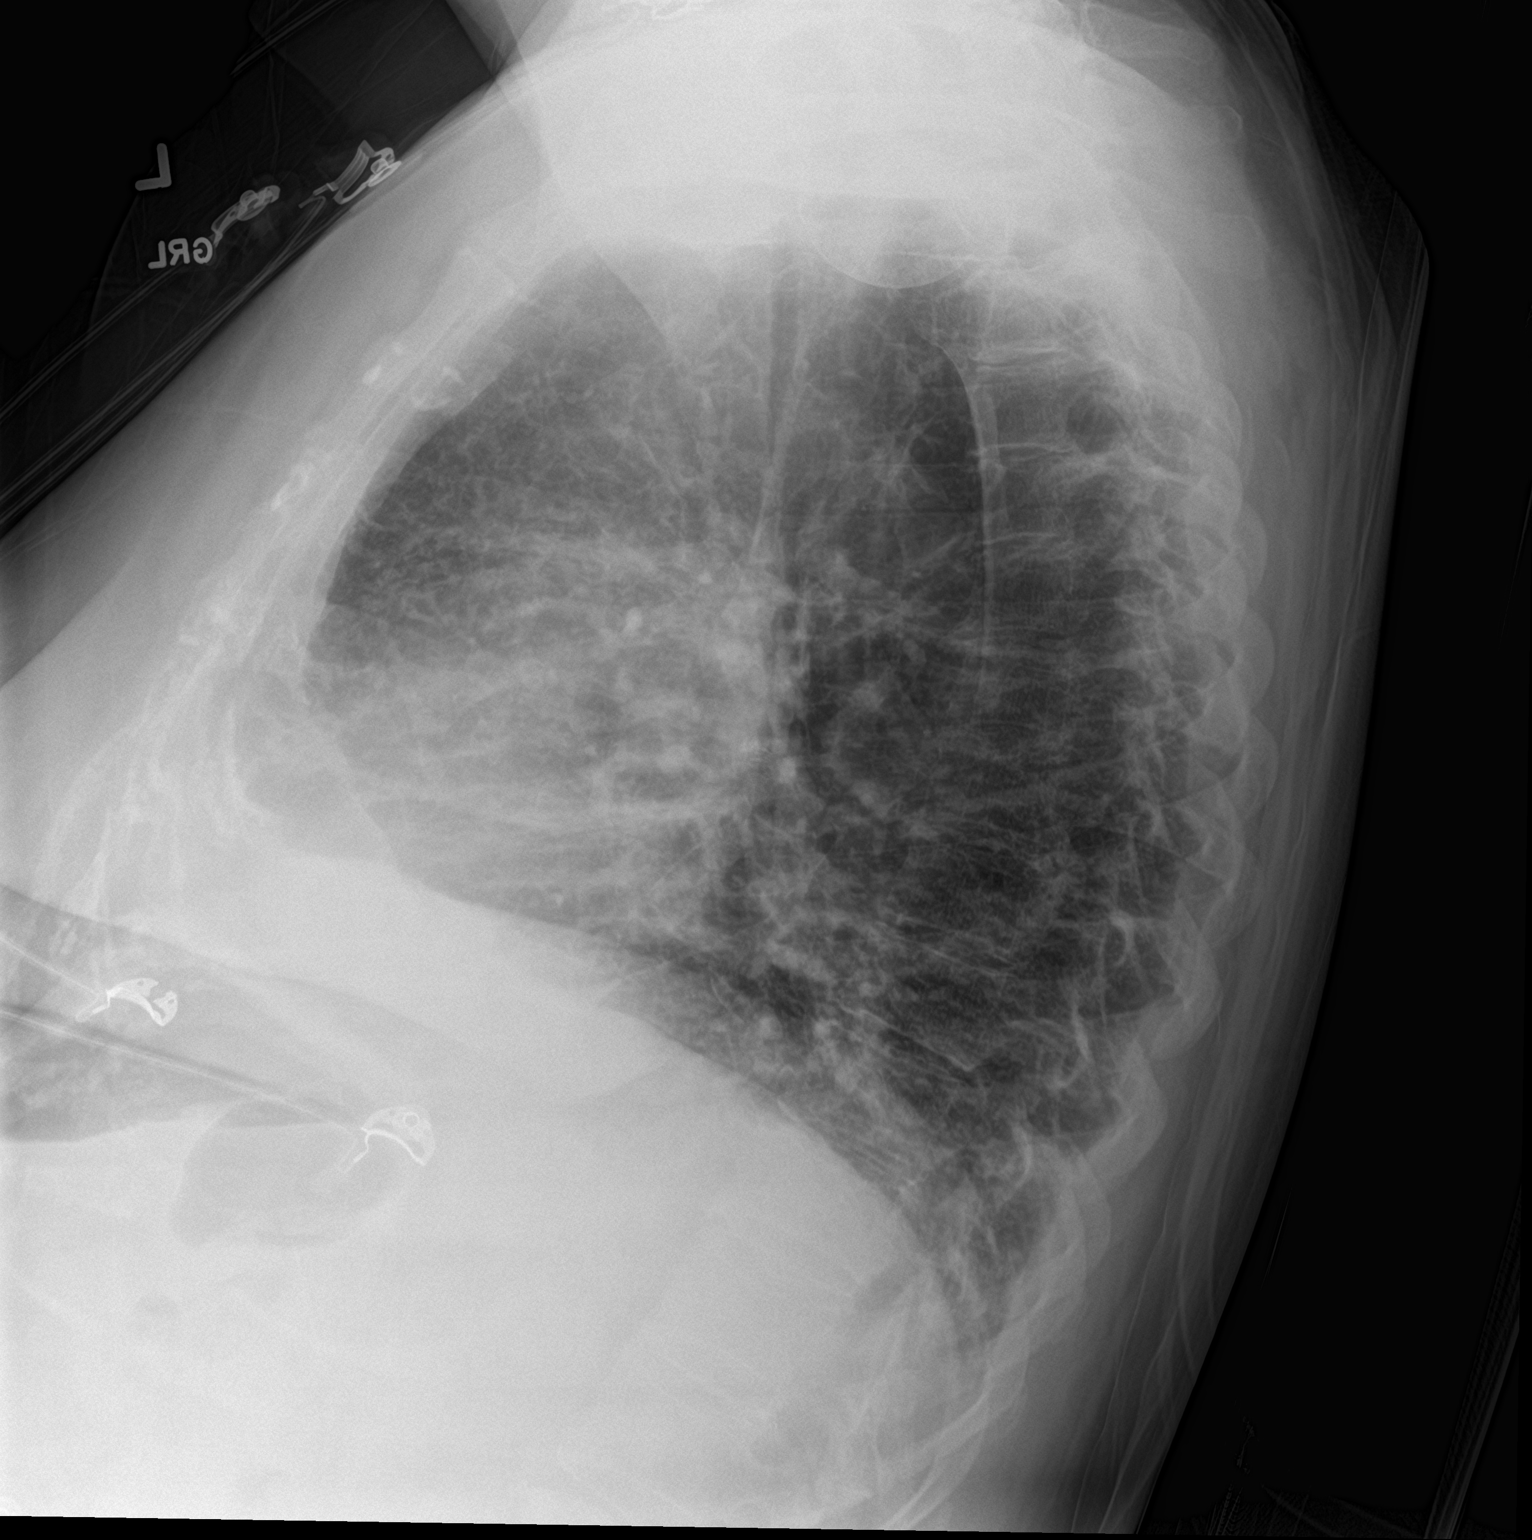

[chest ap]
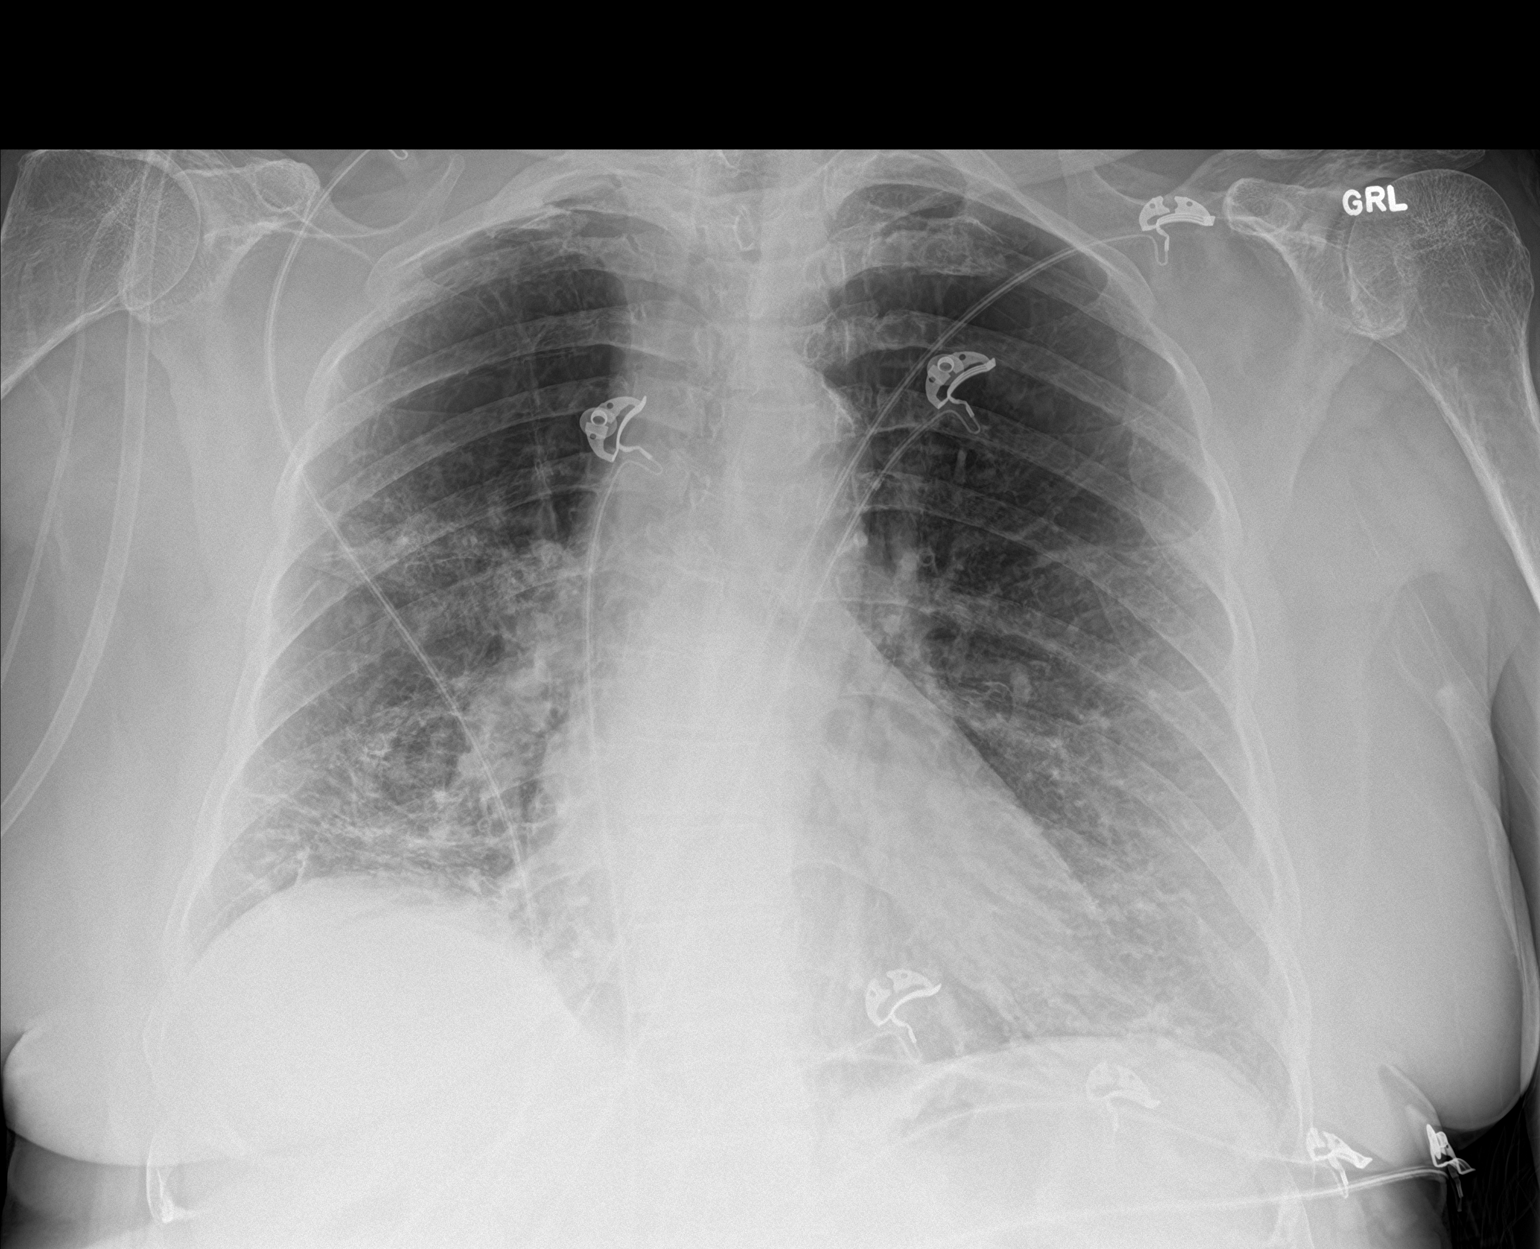

[2 of 2 positions shown; findings below may reference images not displayed]

FINDINGS: Emphysematous changes in the upper lobes, and fibrotic appearing
interstitial coarsening in the central and basilar regions. No
confluent airspace consolidation. No effusions. Hilar, mediastinal
and cardiac contours are unremarkable and unchanged.
IMPRESSION: Severe chronic emphysematous and fibrotic changes. No superimposed
consolidation or effusion.

## 2018-08-02 ENCOUNTER — Emergency Department: Payer: Medicare Other

## 2018-08-02 ENCOUNTER — Other Ambulatory Visit: Payer: Self-pay

## 2018-08-02 ENCOUNTER — Inpatient Hospital Stay
Admission: EM | Admit: 2018-08-02 | Discharge: 2018-08-04 | DRG: 190 | Disposition: A | Discharge status: Home / Self Care | Payer: Medicare Other | Attending: Internal Medicine | Admitting: Internal Medicine

## 2018-08-02 ENCOUNTER — Encounter: Payer: Self-pay | Admitting: Emergency Medicine

## 2018-08-02 DIAGNOSIS — Z820 Family history of epilepsy and other diseases of the nervous system: Secondary | ICD-10-CM | POA: Diagnosis not present

## 2018-08-02 DIAGNOSIS — Z9981 Dependence on supplemental oxygen: Secondary | ICD-10-CM | POA: Diagnosis not present

## 2018-08-02 DIAGNOSIS — Z8249 Family history of ischemic heart disease and other diseases of the circulatory system: Secondary | ICD-10-CM

## 2018-08-02 DIAGNOSIS — I10 Essential (primary) hypertension: Secondary | ICD-10-CM | POA: Diagnosis present

## 2018-08-02 DIAGNOSIS — Z803 Family history of malignant neoplasm of breast: Secondary | ICD-10-CM | POA: Diagnosis not present

## 2018-08-02 DIAGNOSIS — Z833 Family history of diabetes mellitus: Secondary | ICD-10-CM | POA: Diagnosis not present

## 2018-08-02 DIAGNOSIS — I700 Atherosclerosis of aorta: Secondary | ICD-10-CM | POA: Diagnosis present

## 2018-08-02 DIAGNOSIS — J9621 Acute and chronic respiratory failure with hypoxia: Secondary | ICD-10-CM | POA: Diagnosis present

## 2018-08-02 DIAGNOSIS — F329 Major depressive disorder, single episode, unspecified: Secondary | ICD-10-CM | POA: Diagnosis present

## 2018-08-02 DIAGNOSIS — Z885 Allergy status to narcotic agent status: Secondary | ICD-10-CM

## 2018-08-02 DIAGNOSIS — J439 Emphysema, unspecified: Secondary | ICD-10-CM | POA: Diagnosis present

## 2018-08-02 DIAGNOSIS — Z880 Allergy status to penicillin: Secondary | ICD-10-CM | POA: Diagnosis not present

## 2018-08-02 DIAGNOSIS — R32 Unspecified urinary incontinence: Secondary | ICD-10-CM | POA: Diagnosis present

## 2018-08-02 DIAGNOSIS — Z79899 Other long term (current) drug therapy: Secondary | ICD-10-CM | POA: Diagnosis not present

## 2018-08-02 DIAGNOSIS — Z841 Family history of disorders of kidney and ureter: Secondary | ICD-10-CM

## 2018-08-02 DIAGNOSIS — Z87891 Personal history of nicotine dependence: Secondary | ICD-10-CM | POA: Diagnosis not present

## 2018-08-02 DIAGNOSIS — J441 Chronic obstructive pulmonary disease with (acute) exacerbation: Secondary | ICD-10-CM

## 2018-08-02 DIAGNOSIS — J449 Chronic obstructive pulmonary disease, unspecified: Secondary | ICD-10-CM | POA: Diagnosis present

## 2018-08-02 DIAGNOSIS — E785 Hyperlipidemia, unspecified: Secondary | ICD-10-CM | POA: Diagnosis present

## 2018-08-02 DIAGNOSIS — R918 Other nonspecific abnormal finding of lung field: Secondary | ICD-10-CM | POA: Diagnosis present

## 2018-08-02 DIAGNOSIS — Z23 Encounter for immunization: Secondary | ICD-10-CM | POA: Diagnosis not present

## 2018-08-02 LAB — CBC WITH DIFFERENTIAL/PLATELET
ABS IMMATURE GRANULOCYTES: 0.33 10*3/uL — AB (ref 0.00–0.07)
Basophils Absolute: 0.1 10*3/uL (ref 0.0–0.1)
Basophils Relative: 0 %
Eosinophils Absolute: 0 10*3/uL (ref 0.0–0.5)
Eosinophils Relative: 0 %
HCT: 44.8 % (ref 36.0–46.0)
Hemoglobin: 14.6 g/dL (ref 12.0–15.0)
IMMATURE GRANULOCYTES: 2 %
Lymphocytes Relative: 15 %
Lymphs Abs: 2.9 10*3/uL (ref 0.7–4.0)
MCH: 29.4 pg (ref 26.0–34.0)
MCHC: 32.6 g/dL (ref 30.0–36.0)
MCV: 90.3 fL (ref 80.0–100.0)
Monocytes Absolute: 1.8 10*3/uL — ABNORMAL HIGH (ref 0.1–1.0)
Monocytes Relative: 9 %
NEUTROS ABS: 14.6 10*3/uL — AB (ref 1.7–7.7)
Neutrophils Relative %: 74 %
Platelets: 252 10*3/uL (ref 150–400)
RBC: 4.96 MIL/uL (ref 3.87–5.11)
RDW: 13.8 % (ref 11.5–15.5)
WBC: 19.8 10*3/uL — ABNORMAL HIGH (ref 4.0–10.5)
nRBC: 0 % (ref 0.0–0.2)

## 2018-08-02 LAB — PROTIME-INR
INR: 0.96
Prothrombin Time: 12.7 seconds (ref 11.4–15.2)

## 2018-08-02 LAB — URINALYSIS, COMPLETE (UACMP) WITH MICROSCOPIC
Bacteria, UA: NONE SEEN
Bilirubin Urine: NEGATIVE
GLUCOSE, UA: NEGATIVE mg/dL
Hgb urine dipstick: NEGATIVE
Ketones, ur: NEGATIVE mg/dL
Leukocytes, UA: NEGATIVE
Nitrite: NEGATIVE
Protein, ur: NEGATIVE mg/dL
Specific Gravity, Urine: >1.046 — ABNORMAL HIGH (ref 1.005–1.030)
WBC, UA: NONE SEEN WBC/hpf (ref 0–5)
pH: 5 (ref 5.0–8.0)

## 2018-08-02 LAB — PROCALCITONIN: Procalcitonin: <0.1 ng/mL

## 2018-08-02 LAB — APTT: aPTT: 30 seconds (ref 24–36)

## 2018-08-02 LAB — BASIC METABOLIC PANEL
Anion gap: 8 (ref 5–15)
BUN: 17 mg/dL (ref 8–23)
CO2: 32 mmol/L (ref 22–32)
Calcium: 8.9 mg/dL (ref 8.9–10.3)
Chloride: 99 mmol/L (ref 98–111)
Creatinine, Ser: 1.35 mg/dL — ABNORMAL HIGH (ref 0.44–1.00)
GFR calc Af Amer: 46 mL/min — ABNORMAL LOW (ref >60)
GFR calc non Af Amer: 40 mL/min — ABNORMAL LOW (ref >60)
Glucose, Bld: 200 mg/dL — ABNORMAL HIGH (ref 70–99)
POTASSIUM: 4 mmol/L (ref 3.5–5.1)
Sodium: 139 mmol/L (ref 135–145)

## 2018-08-02 LAB — LACTIC ACID, PLASMA: LACTIC ACID, VENOUS: 3 mmol/L — AB (ref 0.5–1.9)

## 2018-08-02 LAB — CG4 I-STAT (LACTIC ACID): Lactic Acid, Venous: 2.1 mmol/L (ref 0.5–1.9)

## 2018-08-02 LAB — TROPONIN I: Troponin I: <0.03 ng/mL (ref <0.03)

## 2018-08-02 MED ORDER — ACETAMINOPHEN 650 MG RE SUPP: 650.0000 mg | Freq: Four times a day (QID) | RECTAL | Status: DC | PRN

## 2018-08-02 MED ORDER — DOXYCYCLINE HYCLATE 100 MG PO TABS
100.0000 mg | ORAL_TABLET | Freq: Two times a day (BID) | ORAL | Status: DC
  Administered 2018-08-02 – 2018-08-04 (×4): 100 mg via ORAL
  Filled 2018-08-02 (×4): qty 1

## 2018-08-02 MED ORDER — OXYBUTYNIN CHLORIDE 5 MG PO TABS
5.0000 mg | ORAL_TABLET | Freq: Three times a day (TID) | ORAL | Status: DC
  Administered 2018-08-02 – 2018-08-04 (×6): 5 mg via ORAL
  Filled 2018-08-02 (×9): qty 1

## 2018-08-02 MED ORDER — VITAMIN B-12 1000 MCG PO TABS
1000.0000 ug | ORAL_TABLET | Freq: Every day | ORAL | Status: DC
  Administered 2018-08-03 – 2018-08-04 (×2): 1000 ug via ORAL
  Filled 2018-08-02 (×2): qty 1

## 2018-08-02 MED ORDER — ADULT MULTIVITAMIN W/MINERALS CH
1.0000 | ORAL_TABLET | Freq: Every day | ORAL | Status: DC
  Administered 2018-08-03 – 2018-08-04 (×2): 1 via ORAL
  Filled 2018-08-02 (×2): qty 1

## 2018-08-02 MED ORDER — ONDANSETRON HCL 4 MG/2ML IJ SOLN: 4.0000 mg | Freq: Four times a day (QID) | INTRAMUSCULAR | Status: DC | PRN

## 2018-08-02 MED ORDER — VITAMIN C 500 MG PO TABS
1000.0000 mg | ORAL_TABLET | Freq: Every day | ORAL | Status: DC
  Administered 2018-08-03 – 2018-08-04 (×2): 1000 mg via ORAL
  Filled 2018-08-02 (×2): qty 2

## 2018-08-02 MED ORDER — VENLAFAXINE HCL ER 37.5 MG PO CP24
112.5000 mg | ORAL_CAPSULE | Freq: Every day | ORAL | Status: DC
  Administered 2018-08-02 – 2018-08-04 (×3): 112.5 mg via ORAL
  Filled 2018-08-02 (×5): qty 3

## 2018-08-02 MED ORDER — ATORVASTATIN CALCIUM 20 MG PO TABS
40.0000 mg | ORAL_TABLET | Freq: Every day | ORAL | Status: DC
  Administered 2018-08-02 – 2018-08-04 (×3): 40 mg via ORAL
  Filled 2018-08-02 (×3): qty 2

## 2018-08-02 MED ORDER — IPRATROPIUM-ALBUTEROL 0.5-2.5 (3) MG/3ML IN SOLN
3.0000 mL | Freq: Four times a day (QID) | RESPIRATORY_TRACT | Status: DC
  Administered 2018-08-02 – 2018-08-04 (×8): 3 mL via RESPIRATORY_TRACT
  Filled 2018-08-02 (×9): qty 3

## 2018-08-02 MED ORDER — ORAL CARE MOUTH RINSE
15.0000 mL | Freq: Two times a day (BID) | OROMUCOSAL | Status: DC
  Administered 2018-08-02 – 2018-08-03 (×3): 15 mL via OROMUCOSAL

## 2018-08-02 MED ORDER — SODIUM CHLORIDE 0.9 % IV SOLN
500.0000 mg | INTRAVENOUS | Status: DC
  Administered 2018-08-02: 500 mg via INTRAVENOUS
  Filled 2018-08-02 (×2): qty 500

## 2018-08-02 MED ORDER — FUROSEMIDE 40 MG PO TABS
40.0000 mg | ORAL_TABLET | Freq: Every day | ORAL | Status: DC
  Administered 2018-08-02 – 2018-08-04 (×3): 40 mg via ORAL
  Filled 2018-08-02 (×3): qty 1

## 2018-08-02 MED ORDER — ACETAMINOPHEN 325 MG PO TABS: 650.0000 mg | ORAL_TABLET | Freq: Four times a day (QID) | ORAL | Status: DC | PRN

## 2018-08-02 MED ORDER — SODIUM CHLORIDE 0.9 % IV SOLN
2.0000 g | Freq: Once | INTRAVENOUS | Status: AC
  Administered 2018-08-02: 2 g via INTRAVENOUS
  Filled 2018-08-02: qty 2

## 2018-08-02 MED ORDER — ONDANSETRON HCL 4 MG PO TABS: 4.0000 mg | ORAL_TABLET | Freq: Four times a day (QID) | ORAL | Status: DC | PRN

## 2018-08-02 MED ORDER — SODIUM CHLORIDE 0.9 % IV BOLUS
1000.0000 mL | Freq: Once | INTRAVENOUS | Status: AC
  Administered 2018-08-02: 1000 mL via INTRAVENOUS

## 2018-08-02 MED ORDER — ENOXAPARIN SODIUM 40 MG/0.4ML ~~LOC~~ SOLN
40.0000 mg | SUBCUTANEOUS | Status: DC
  Administered 2018-08-02 – 2018-08-04 (×3): 40 mg via SUBCUTANEOUS
  Filled 2018-08-02 (×3): qty 0.4

## 2018-08-02 MED ORDER — METHYLPREDNISOLONE SODIUM SUCC 40 MG IJ SOLR
40.0000 mg | Freq: Four times a day (QID) | INTRAMUSCULAR | Status: DC
  Administered 2018-08-02 – 2018-08-04 (×9): 40 mg via INTRAVENOUS
  Filled 2018-08-02 (×9): qty 1

## 2018-08-02 MED ORDER — INFLUENZA VAC SPLIT HIGH-DOSE 0.5 ML IM SUSY
0.5000 mL | PREFILLED_SYRINGE | INTRAMUSCULAR | Status: AC
  Administered 2018-08-03: 0.5 mL via INTRAMUSCULAR
  Filled 2018-08-02 (×3): qty 0.5

## 2018-08-02 MED ORDER — MAGNESIUM SULFATE 2 GM/50ML IV SOLN
2.0000 g | INTRAVENOUS | Status: AC
  Administered 2018-08-02: 2 g via INTRAVENOUS
  Filled 2018-08-02: qty 50

## 2018-08-02 MED ORDER — ONDANSETRON HCL 4 MG/2ML IJ SOLN
INTRAMUSCULAR | Status: AC
  Administered 2018-08-02: 4 mg via INTRAVENOUS
  Filled 2018-08-02: qty 2

## 2018-08-02 MED ORDER — POTASSIUM CHLORIDE CRYS ER 20 MEQ PO TBCR
20.0000 meq | EXTENDED_RELEASE_TABLET | Freq: Every day | ORAL | Status: DC
  Administered 2018-08-02 – 2018-08-04 (×3): 20 meq via ORAL
  Filled 2018-08-02 (×3): qty 1

## 2018-08-02 MED ORDER — IPRATROPIUM-ALBUTEROL 0.5-2.5 (3) MG/3ML IN SOLN
3.0000 mL | Freq: Once | RESPIRATORY_TRACT | Status: AC
  Administered 2018-08-02: 3 mL via RESPIRATORY_TRACT
  Filled 2018-08-02: qty 3

## 2018-08-02 MED ORDER — VITAMIN D 25 MCG (1000 UNIT) PO TABS
1000.0000 [IU] | ORAL_TABLET | Freq: Every day | ORAL | Status: DC
  Administered 2018-08-03 – 2018-08-04 (×2): 1000 [IU] via ORAL
  Filled 2018-08-02 (×2): qty 1

## 2018-08-02 MED ORDER — BUDESONIDE 0.5 MG/2ML IN SUSP
0.5000 mg | Freq: Two times a day (BID) | RESPIRATORY_TRACT | Status: DC
  Administered 2018-08-02 – 2018-08-04 (×5): 0.5 mg via RESPIRATORY_TRACT
  Filled 2018-08-02 (×5): qty 2

## 2018-08-02 MED ORDER — ONDANSETRON HCL 4 MG/2ML IJ SOLN
4.0000 mg | Freq: Once | INTRAMUSCULAR | Status: AC
  Administered 2018-08-02: 4 mg via INTRAVENOUS

## 2018-08-02 MED ORDER — IOHEXOL 350 MG/ML SOLN
60.0000 mL | Freq: Once | INTRAVENOUS | Status: AC | PRN
  Administered 2018-08-02: 60 mL via INTRAVENOUS

## 2018-08-02 MED ORDER — SODIUM CHLORIDE 0.9 % IV SOLN
2.0000 g | Freq: Two times a day (BID) | INTRAVENOUS | Status: DC
  Filled 2018-08-02 (×2): qty 2

## 2018-08-02 MED ORDER — DILTIAZEM HCL ER COATED BEADS 240 MG PO CP24
240.0000 mg | ORAL_CAPSULE | Freq: Every day | ORAL | Status: DC
  Administered 2018-08-02 – 2018-08-04 (×3): 240 mg via ORAL
  Filled 2018-08-02 (×3): qty 1

## 2018-08-02 NOTE — ED Provider Notes (Signed)
[facility] Emergency Department Provider Note  ____________________________________________  Time seen: Approximately 8:25 AM  I have reviewed the triage vital signs and the nursing notes.   HISTORY  Chief Complaint Respiratory Distress    HPI Audrey Sexton is a 67 y.o. female with a history of COPD hypertension, on 3 L nasal cannula oxygen chronically, brought to the ED due to shortness of breath that started yesterday morning, worsening.  Continuous.  Severe.  No aggravating or alleviating factors.  EMS report that initially the patient had an oxygen saturation of 80% on her 3 L nasal cannula.  On arrival to the ED on 3 L, she is saturating 77%.  She denies chest pain.  No belly pain vomiting diarrhea fevers chills or sweats.  No weight changes.  No increased peripheral edema.      Past Medical History:  Diagnosis Date  . Asthma   . Carpal tunnel syndrome of left wrist   . COPD (chronic obstructive pulmonary disease) (HCC)   . Hyperlipemia   . Hypertension      Patient Active Problem List   Diagnosis Date Noted  . Acute respiratory failure with hypoxia (HCC) 06/09/2017  . Acute respiratory distress   . DNR (do not resuscitate) discussion 08/23/2016  . Palliative care by specialist 08/23/2016  . Dyspnea 08/23/2016  . COPD exacerbation (HCC) 08/18/2016     Past Surgical History:  Procedure Laterality Date  . CARPAL TUNNEL RELEASE Left   . fractured tibia Left    repair  . ROTATOR CUFF REPAIR Left   . TUBAL LIGATION       Prior to Admission medications   Medication Sig Start Date End Date Taking? Authorizing Provider  acetaminophen (TYLENOL) 500 MG tablet Take 500-1,000 mg by mouth every 6 (six) hours as needed for fever or pain.    Yes [provider]  albuterol (PROVENTIL HFA;VENTOLIN HFA) 108 (90 Base) MCG/ACT inhaler Inhale 2-4 puffs by mouth every 4 hours as needed for wheezing, cough, and/or shortness of breath 01/01/17  Yes  Loleta Rose, MD  Ascorbic Acid (VITAMIN C) 1000 MG tablet Take 1,000 mg by mouth daily.   Yes [provider]  atorvastatin (LIPITOR) 40 MG tablet Take 40 mg by mouth daily.   Yes [provider]  Cholecalciferol (VITAMIN D-1000 MAX ST) 1000 units tablet Take 1,000 Units by mouth daily.   Yes [provider]  diltiazem (CARDIZEM CD) 240 MG 24 hr capsule Take 240 mg by mouth daily.   Yes [provider]  fluticasone furoate-vilanterol (BREO ELLIPTA) 100-25 MCG/INH AEPB Inhale 1 puff into the lungs daily. 06/14/17  Yes Sudini, Wardell Heath, MD  furosemide (LASIX) 40 MG tablet Take 40 mg by mouth daily.   Yes [provider]  Multiple Vitamin (MULTI-VITAMIN DAILY PO) Take 1 tablet by mouth daily.   Yes [provider]  oxybutynin (DITROPAN) 5 MG tablet Take 5 mg by mouth 3 (three) times daily.    Yes [provider]  potassium chloride SA (K-DUR,KLOR-CON) 20 MEQ tablet Take 1 tablet (20 mEq total) by mouth daily. 06/15/17  Yes Sudini, Wardell Heath, MD  umeclidinium bromide (INCRUSE ELLIPTA) 62.5 MCG/INH AEPB Inhale 1 puff into the lungs daily.   Yes [provider]  venlafaxine XR (EFFEXOR-XR) 37.5 MG 24 hr capsule Take 112.5 mg by mouth daily.  07/28/18  Yes [provider]  vitamin B-12 (CYANOCOBALAMIN) 1000 MCG tablet Take 1,000 mcg by mouth daily.    Yes [provider]  predniSONE (STERAPRED UNI-PAK 21 TAB) 10 MG (21) TBPK tablet 60 mg day 1 and taper 10 mg daily 06/15/17   Milagros Loll, MD     Allergies Percocet [oxycodone-acetaminophen]; Vicodin [hydrocodone-acetaminophen]; and Penicillins   Family History  Problem Relation Age of Onset  . Heart disease Mother        died at 51  . Diabetes Mother   . Alzheimer's disease Father        died at 62  . Parkinson's disease Sister   . Heart disease Brother        died at 24  . Diabetes Brother   . Kidney disease Sister   . Breast cancer Sister     Social  History Social History   Tobacco Use  . Smoking status: Former Smoker    Types: Cigarettes  . Smokeless tobacco: Never Used  Substance Use Topics  . Alcohol use: No  . Drug use: No    Review of Systems  Constitutional:   No fever or chills.  ENT:   No sore throat. No rhinorrhea. Cardiovascular:   No chest pain or syncope. Respiratory:   Positive shortness of breath as above and nonproductive  cough. Gastrointestinal:   Negative for abdominal pain, vomiting and diarrhea.  Musculoskeletal:   Negative for focal pain or swelling All other systems reviewed and are negative except as documented above in ROS and HPI.  ____________________________________________   PHYSICAL EXAM:  VITAL SIGNS: ED Triage Vitals  Enc Vitals Group     BP 08/02/18 0705 129/62     Pulse Rate 08/02/18 0705 (!) 122     Resp 08/02/18 0715 (!) 30     Temp --      Temp src --      SpO2 08/02/18 0705 96 %     Weight 08/02/18 0748 205 lb 14.4 oz (93.4 kg)     Height --      Head Circumference --      Peak Flow --      Pain Score --      Pain Loc --      Pain Edu? --      Excl. in GC? --     Vital signs reviewed, nursing assessments reviewed.   Constitutional:   Alert and oriented.  Ill-appearing.  Respiratory distress. Eyes:   Conjunctivae are normal. EOMI. PERRL. ENT      Head:   Normocephalic and atraumatic.      Nose:   No congestion/rhinnorhea.       Mouth/Throat:   Dry mucous membranes, no pharyngeal erythema. No peritonsillar mass.       Neck:   No meningismus. Full ROM. Hematological/Lymphatic/Immunilogical:   No cervical lymphadenopathy. Cardiovascular:   Tachycardia heart rate 120. Symmetric bilateral radial and DP pulses.  No murmurs. Cap refill less than 2 seconds. Respiratory: Tachypnea, increased work of breathing.  Symmetric air entry in all lung fields.  No focal crackles.  Diffuse expiratory wheezing and prolonged expiratory phase, accentuated by FEV1 maneuver. Gastrointestinal:    Soft and nontender. Non distended. There is no CVA tenderness.  No rebound, rigidity, or guarding.  Musculoskeletal:   Normal range of motion in all extremities. No joint effusions.  No lower extremity tenderness.  Trace bilateral edema. Neurologic:   Normal speech and language.  Motor grossly intact. No acute focal neurologic deficits are appreciated.  Skin:    Skin is warm, dry and intact. No rash noted.  No petechiae, purpura, or  bullae.  ____________________________________________    LABS (pertinent positives/negatives) (all labs ordered are listed, but only abnormal results are displayed) Labs Reviewed  BASIC METABOLIC PANEL - Abnormal; Notable for the following components:      Result Value   Glucose, Bld 200 (*)    Creatinine, Ser 1.35 (*)    GFR calc non Af Amer 40 (*)    GFR calc Af Amer 46 (*)    All other components within normal limits  CBC WITH DIFFERENTIAL/PLATELET - Abnormal; Notable for the following components:   WBC 19.8 (*)    Neutro Abs 14.6 (*)    Monocytes Absolute 1.8 (*)    Abs Immature Granulocytes 0.33 (*)    All other components within normal limits  CG4 I-STAT (LACTIC ACID) - Abnormal; Notable for the following components:   Lactic Acid, Venous 2.10 (*)    All other components within normal limits  CULTURE, BLOOD (ROUTINE X 2)  CULTURE, BLOOD (ROUTINE X 2)  URINE CULTURE  TROPONIN I  PROTIME-INR  APTT  PROCALCITONIN  URINALYSIS, COMPLETE (UACMP) WITH MICROSCOPIC  LACTIC ACID, PLASMA  I-STAT CG4 LACTIC ACID, ED   ____________________________________________   EKG  Interpreted by me Sinus tachycardia rate 119, normal axis and intervals.  Normal QRS ST segments and T waves.  No acute ischemic changes.  ____________________________________________    RADIOLOGY  Ct Angio Chest Pe W And/or Wo Contrast  Result Date: 08/02/2018 CLINICAL DATA:  Chronic dyspnea. EXAM: CT ANGIOGRAPHY CHEST WITH CONTRAST TECHNIQUE: Multidetector CT imaging  of the chest was performed using the standard protocol during bolus administration of intravenous contrast. Multiplanar CT image reconstructions and MIPs were obtained to evaluate the vascular anatomy. CONTRAST:  60mL OMNIPAQUE IOHEXOL 350 MG/ML SOLN COMPARISON:  CT scan of August 17, 2016.  Radiograph of same day. FINDINGS: Cardiovascular: Satisfactory opacification of the pulmonary arteries to the segmental level. No evidence of pulmonary embolism. Normal heart size. No pericardial effusion. Atherosclerosis of thoracic aorta is noted without aneurysm or dissection. Mediastinum/Nodes: No enlarged mediastinal, hilar, or axillary lymph nodes. Thyroid gland, trachea, and esophagus demonstrate no significant findings. Lungs/Pleura: No pneumothorax or pleural effusion is noted. Emphysematous disease is noted in the upper lobes bilaterally. Stable scarring is noted in right lower lobe. Scarring is also noted in the right upper lobe. However, new irregular soft tissue abnormality measuring 18 x 17 mm is noted laterally in the right upper lobe; is uncertain if this represents scarring, inflammation or possibly neoplasm. Upper Abdomen: No acute abnormality. Musculoskeletal: No chest wall abnormality. No acute or significant osseous findings. Review of the MIP images confirms the above findings. IMPRESSION: No definite evidence of pulmonary embolus. New irregular soft tissue density measuring 18 mm is noted laterally in the right upper lobe; it is uncertain if this represents pneumonia or possibly malignancy. Clinical correlation as well as unenhanced chest CT in 2-3 weeks is recommended to ensure resolution. If this abnormality persists on follow-up CT scan, PET scan is recommended to evaluate for possible malignancy. Otherwise stable scarring is noted in right upper and lower lobes. Aortic Atherosclerosis (ICD10-I70.0) and Emphysema (ICD10-J43.9). Electronically Signed   By: Lupita Raider, M.D.   On: 08/02/2018 09:47    Dg Chest Portable 1 View  Result Date: 08/02/2018 CLINICAL DATA:  Difficulty breathing and shortness of breath EXAM: PORTABLE CHEST 1 VIEW COMPARISON:  06/09/2017 FINDINGS: Cardiac shadow is within normal limits. The lungs are well aerated bilaterally. Chronic scarring is again noted in the right lung base.  No acute infiltrate is noted. IMPRESSION: Chronic scarring without acute abnormality. Electronically Signed   By: Alcide CleverMark  Lukens M.D.   On: 08/02/2018 07:27    ____________________________________________   PROCEDURES .Critical Care Performed by: Sharman CheekStafford, Aymee Fomby, MD Authorized by: Sharman CheekStafford, Spike Desilets, MD   Critical care provider statement:    Critical care time (minutes):  35   Critical care time was exclusive of:  Separately billable procedures and treating other patients   Critical care was necessary to treat or prevent imminent or life-threatening deterioration of the following conditions:  Respiratory failure and sepsis   Critical care was time spent personally by me on the following activities:  Development of treatment plan with patient or surrogate, discussions with consultants, evaluation of patient's response to treatment, examination of patient, obtaining history from patient or surrogate, ordering and performing treatments and interventions, ordering and review of laboratory studies, ordering and review of radiographic studies, pulse oximetry, re-evaluation of patient's condition and review of old charts    ____________________________________________  DIFFERENTIAL DIAGNOSIS   COPD exacerbation, pneumonia, pneumothorax, pulmonary edema, pulmonary embolism  CLINICAL IMPRESSION / ASSESSMENT AND PLAN / ED COURSE  Pertinent labs & imaging results that were available during my care of the patient were reviewed by me and considered in my medical decision making (see chart for details).      Clinical Course as of Aug 03 1003  Fri Aug 02, 2018  [id number] Patient presents with  respiratory distress with a history of COPD.  Lung exam reveals good air entry without crackles, diffuse expiratory wheezing and prolonged expiratory phase, favoring COPD and pneumonia and unlikely pulmonary edema.  Afebrile, but tachycardic, tachypneic, hypoxic on arrival, concerning for sepsis.  Sepsis protocol initiated, start antibiotics with cefepime and azithromycin.  Patient placed on BiPAP on arrival due to respiratory distress.  Patient will need hospitalization for further management after initial stabilization and work-up.   [PS]  [id number] Initial work-up shows a lactate of 2.1.  Chest x-ray is unremarkable without any evidence of edema or infiltrate or pneumothorax.  She has a leukocytosis with WBC of almost 20,000, predominantly neutrophils.  Rest of the labs are otherwise unremarkable, stable CKD.  Given the tachycardia tachypnea and respiratory failure, nondiagnostic x-ray, I will obtain CT scan of the chest to evaluate for PE versus occult pneumonia.  I will give a fluid bolus as she is likely relatively dehydrated.  No signs of shock at present time.   [PS]    Clinical Course User Index [PS] Sharman CheekStafford, Zackari Ruane, MD     ----------------------------------------- 10:03 AM on 08/02/2018 -----------------------------------------  CT angiogram negative for PE but shows a nearly 2 cm mass in the lung, infection versus malignancy.  With her respiratory failure, she will need hospitalization for further respiratory support.  ____________________________________________   FINAL CLINICAL IMPRESSION(S) / ED DIAGNOSES    Final diagnoses:  COPD exacerbation (HCC)  Acute on chronic respiratory failure with hypoxia (HCC)  Aortic atherosclerosis (HCC)  Pulmonary emphysema, unspecified emphysema type [facility](HCC)     ED Discharge Orders    None      Portions of this note were generated with dragon dictation software. Dictation errors may occur despite best attempts at proofreading.     Sharman CheekStafford, Thomasine Klutts, MD 08/02/18 1004

## 2018-08-02 NOTE — Consult Note (Signed)
Pharmacy Antibiotic Note  Audrey Sexton is a 67 y.o. female admitted on 08/02/2018 with pneumonia.  PMH of COPD, on 3 L [place] at home. Pharmacy has been consulted for Cefepime dosing.  Plan: Ordered Cefepime 2 g IV q12 based on CrCl ~43 mL/min (height 65 in.)  Weight: 205 lb 14.4 oz (93.4 kg)  No data recorded.  Recent Labs  Lab 08/02/18 0711 08/02/18 0718  WBC 19.8*  --   CREATININE 1.35*  --   LATICACIDVEN  --  2.10*    CrCl cannot be calculated (Unknown ideal weight.).    Allergies  Allergen Reactions  . Percocet [Oxycodone-Acetaminophen] Nausea And Vomiting  . Vicodin [Hydrocodone-Acetaminophen] Nausea And Vomiting  . Penicillins Hives    Has patient had a PCN reaction causing immediate rash, facial/tongue/throat swelling, SOB or lightheadedness with hypotension: no Has patient had a PCN reaction causing severe rash involving mucus membranes or skin necrosis: no Has patient had a PCN reaction that required hospitalization  no Has patient had a PCN reaction occurring within the last 10 years: no If all of the above answers are "NO", then may proceed with Cephalosporin use.     Antimicrobials this admission: 1206 Cefepime >>  1206 Azithromycin >>   Dose adjustments this admission: N/A  Microbiology results: 1206 BCx: sent 1206 UCx: sent    Thank you for allowing pharmacy to be a part of this patient's care.   Mauri ReadingSavanna M Martin, PharmD Pharmacy Resident  08/02/2018 10:19 AM

## 2018-08-02 NOTE — ED Notes (Signed)
Dr. Sainani at bedside. 

## 2018-08-02 NOTE — H&P (Signed)
[facility] - [place] at [facility]    PATIENT NAME: Audrey Sexton    MR#:  [id number]  DATE OF BIRTH:  06-04-1951  DATE OF ADMISSION:  08/02/2018  PRIMARY CARE PHYSICIAN: Oswaldo ConroyBender, Abby Daneele, MD   REQUESTING/REFERRING PHYSICIAN: Dr. Sharman CheekPhillip  Stafford  CHIEF COMPLAINT:   Chief Complaint  Patient presents with  . Respiratory Distress    HISTORY OF PRESENT ILLNESS:  Audrey Sexton  is a 67 y.o. female with a known history of COPD, hypertension, hyperlipidemia who presents to the hospital due to shortness of breath and respiratory distress.  Patient was in her usual state of health when early this morning she woke up having significant shortness of breath at rest.  She admits to a cough which is nonproductive, denies any fevers chills nausea or vomiting.  She is on oxygen at home but despite that and using her inhaler she was not feeling better and therefore came to the ER for further evaluation.  In the emergency room patient was noted to be acute on chronic respiratory failure with hypoxia secondary to COPD exacerbation.  Patient was started on BiPAP, given some IV steroids and nebulizer treatments.  She has improved but still continues to have significant wheezing and bronchospasm.  Hospitalist services were contacted for admission for COPD exacerbation.  Patient denies any fevers, chills, sick contacts or any other associated symptoms presently.  PAST MEDICAL HISTORY:   Past Medical History:  Diagnosis Date  . Asthma   . Carpal tunnel syndrome of left wrist   . COPD (chronic obstructive pulmonary disease) (HCC)   . Hyperlipemia   . Hypertension     PAST SURGICAL HISTORY:   Past Surgical History:  Procedure Laterality Date  . CARPAL TUNNEL RELEASE Left   . fractured tibia Left    repair  . ROTATOR CUFF REPAIR Left   . TUBAL LIGATION      SOCIAL HISTORY:   Social History   Tobacco Use  . Smoking status: Former Smoker    Packs/day: 1.00    Years:  40.00    Pack years: 40.00    Types: Cigarettes  . Smokeless tobacco: Never Used  Substance Use Topics  . Alcohol use: No    FAMILY HISTORY:   Family History  Problem Relation Age of Onset  . Heart disease Mother        died at 372  . Diabetes Mother   . Alzheimer's disease Father        died at 2977  . Parkinson's disease Sister   . Heart disease Brother        died at 460  . Diabetes Brother   . Kidney disease Sister   . Breast cancer Sister     DRUG ALLERGIES:   Allergies  Allergen Reactions  . Percocet [Oxycodone-Acetaminophen] Nausea And Vomiting  . Vicodin [Hydrocodone-Acetaminophen] Nausea And Vomiting  . Penicillins Hives    Has patient had a PCN reaction causing immediate rash, facial/tongue/throat swelling, SOB or lightheadedness with hypotension: no Has patient had a PCN reaction causing severe rash involving mucus membranes or skin necrosis: no Has patient had a PCN reaction that required hospitalization  no Has patient had a PCN reaction occurring within the last 10 years: no If all of the above answers are "NO", then may proceed with Cephalosporin use.     REVIEW OF SYSTEMS:   Review of Systems  Constitutional: Negative for fever and weight loss.  HENT: Negative for congestion, nosebleeds and tinnitus.  Eyes: Negative for blurred vision, double vision and redness.  Respiratory: Positive for cough, shortness of breath and wheezing. Negative for hemoptysis.   Cardiovascular: Negative for chest pain, orthopnea, leg swelling and PND.  Gastrointestinal: Negative for abdominal pain, diarrhea, melena, nausea and vomiting.  Genitourinary: Negative for dysuria, hematuria and urgency.  Musculoskeletal: Negative for falls and joint pain.  Neurological: Negative for dizziness, tingling, sensory change, focal weakness, seizures, weakness and headaches.  Endo/Heme/Allergies: Negative for polydipsia. Does not bruise/bleed easily.  Psychiatric/Behavioral: Negative for  depression and memory loss. The patient is not nervous/anxious.     MEDICATIONS AT HOME:   Prior to Admission medications   Medication Sig Start Date End Date Taking? Authorizing Provider  acetaminophen (TYLENOL) 500 MG tablet Take 500-1,000 mg by mouth every 6 (six) hours as needed for fever or pain.    Yes [provider]  albuterol (PROVENTIL HFA;VENTOLIN HFA) 108 (90 Base) MCG/ACT inhaler Inhale 2-4 puffs by mouth every 4 hours as needed for wheezing, cough, and/or shortness of breath 01/01/17  Yes Loleta Rose, MD  Ascorbic Acid (VITAMIN C) 1000 MG tablet Take 1,000 mg by mouth daily.   Yes [provider]  atorvastatin (LIPITOR) 40 MG tablet Take 40 mg by mouth daily.   Yes [provider]  Cholecalciferol (VITAMIN D-1000 MAX ST) 1000 units tablet Take 1,000 Units by mouth daily.   Yes [provider]  diltiazem (CARDIZEM CD) 240 MG 24 hr capsule Take 240 mg by mouth daily.   Yes [provider]  fluticasone furoate-vilanterol (BREO ELLIPTA) 100-25 MCG/INH AEPB Inhale 1 puff into the lungs daily. 06/14/17  Yes Sudini, Wardell Heath, MD  furosemide (LASIX) 40 MG tablet Take 40 mg by mouth daily.   Yes [provider]  Multiple Vitamin (MULTI-VITAMIN DAILY PO) Take 1 tablet by mouth daily.   Yes [provider]  oxybutynin (DITROPAN) 5 MG tablet Take 5 mg by mouth 3 (three) times daily.    Yes [provider]  potassium chloride SA (K-DUR,KLOR-CON) 20 MEQ tablet Take 1 tablet (20 mEq total) by mouth daily. 06/15/17  Yes Sudini, Wardell Heath, MD  umeclidinium bromide (INCRUSE ELLIPTA) 62.5 MCG/INH AEPB Inhale 1 puff into the lungs daily.   Yes [provider]  venlafaxine XR (EFFEXOR-XR) 37.5 MG 24 hr capsule Take 112.5 mg by mouth daily.  07/28/18  Yes [provider]  vitamin B-12 (CYANOCOBALAMIN) 1000 MCG tablet Take 1,000 mcg by mouth daily.    Yes [provider]  predniSONE (STERAPRED UNI-PAK 21 TAB) 10  MG (21) TBPK tablet 60 mg day 1 and taper 10 mg daily 06/15/17   Sudini, Wardell Heath, MD      VITAL SIGNS:  Blood pressure 113/73, pulse (!) 110, resp. rate 18, weight 93.4 kg, SpO2 95 %.  PHYSICAL EXAMINATION:  Physical Exam  GENERAL:  67 y.o.-year-old patient lying in the bed in mild Resp. Distres.  EYES: Pupils equal, round, reactive to light and accommodation. No scleral icterus. Extraocular muscles intact.  HEENT: Head atraumatic, normocephalic. Oropharynx and nasopharynx clear. No oropharyngeal erythema, moist oral mucosa  NECK:  Supple, no jugular venous distention. No thyroid enlargement, no tenderness.  LUNGS: Good A/e B/l, end-exp wheezing b/l, No rales, rhonchi. No use of accessory muscles of respiration.  CARDIOVASCULAR: S1, S2 RRR. No murmurs, rubs, gallops, clicks.  ABDOMEN: Soft, nontender, nondistended. Bowel sounds present. No organomegaly or mass.  EXTREMITIES: No pedal edema, cyanosis, or clubbing. + 2 pedal & radial pulses b/l.   NEUROLOGIC:  Cranial nerves II through XII are intact. No focal Motor or sensory deficits appreciated b/l.  PSYCHIATRIC: The patient is alert and oriented x 3. SKIN: No obvious rash, lesion, or ulcer.   LABORATORY PANEL:   CBC Recent Labs  Lab 08/02/18 0711  WBC 19.8*  HGB 14.6  HCT 44.8  PLT 252   ------------------------------------------------------------------------------------------------------------------  Chemistries  Recent Labs  Lab 08/02/18 0711  NA 139  K 4.0  CL 99  CO2 32  GLUCOSE 200*  BUN 17  CREATININE 1.35*  CALCIUM 8.9   ------------------------------------------------------------------------------------------------------------------  Cardiac Enzymes Recent Labs  Lab 08/02/18 0711  TROPONINI <0.03   ------------------------------------------------------------------------------------------------------------------  RADIOLOGY:  Ct Angio Chest Pe W And/or Wo Contrast  Result Date: 08/02/2018 CLINICAL  DATA:  Chronic dyspnea. EXAM: CT ANGIOGRAPHY CHEST WITH CONTRAST TECHNIQUE: Multidetector CT imaging of the chest was performed using the standard protocol during bolus administration of intravenous contrast. Multiplanar CT image reconstructions and MIPs were obtained to evaluate the vascular anatomy. CONTRAST:  60mL OMNIPAQUE IOHEXOL 350 MG/ML SOLN COMPARISON:  CT scan of August 17, 2016.  Radiograph of same day. FINDINGS: Cardiovascular: Satisfactory opacification of the pulmonary arteries to the segmental level. No evidence of pulmonary embolism. Normal heart size. No pericardial effusion. Atherosclerosis of thoracic aorta is noted without aneurysm or dissection. Mediastinum/Nodes: No enlarged mediastinal, hilar, or axillary lymph nodes. Thyroid gland, trachea, and esophagus demonstrate no significant findings. Lungs/Pleura: No pneumothorax or pleural effusion is noted. Emphysematous disease is noted in the upper lobes bilaterally. Stable scarring is noted in right lower lobe. Scarring is also noted in the right upper lobe. However, new irregular soft tissue abnormality measuring 18 x 17 mm is noted laterally in the right upper lobe; is uncertain if this represents scarring, inflammation or possibly neoplasm. Upper Abdomen: No acute abnormality. Musculoskeletal: No chest wall abnormality. No acute or significant osseous findings. Review of the MIP images confirms the above findings. IMPRESSION: No definite evidence of pulmonary embolus. New irregular soft tissue density measuring 18 mm is noted laterally in the right upper lobe; it is uncertain if this represents pneumonia or possibly malignancy. Clinical correlation as well as unenhanced chest CT in 2-3 weeks is recommended to ensure resolution. If this abnormality persists on follow-up CT scan, PET scan is recommended to evaluate for possible malignancy. Otherwise stable scarring is noted in right upper and lower lobes. Aortic Atherosclerosis (ICD10-I70.0)  and Emphysema (ICD10-J43.9). Electronically Signed   By: Lupita Raider, M.D.   On: 08/02/2018 09:47   Dg Chest Portable 1 View  Result Date: 08/02/2018 CLINICAL DATA:  Difficulty breathing and shortness of breath EXAM: PORTABLE CHEST 1 VIEW COMPARISON:  06/09/2017 FINDINGS: Cardiac shadow is within normal limits. The lungs are well aerated bilaterally. Chronic scarring is again noted in the right lung base. No acute infiltrate is noted. IMPRESSION: Chronic scarring without acute abnormality. Electronically Signed   By: Alcide Clever M.D.   On: 08/02/2018 07:27     IMPRESSION AND PLAN:   67 year old female with past medical history of COPD on home oxygen, hypertension, hyperlipidemia, chronic respiratory failure who presents to the hospital due to shortness of breath and respiratory distress.  1.  Acute on chronic respiratory failure with hypoxia-secondary to COPD exacerbation. - In the ER patient was placed on BiPAP but has improved and currently off BiPAP.   - We will treat underlying COPD with IV steroids scheduled duo nebs, Pulmicort nebs.  CT chest is negative for pulmonary embolism.  2.  COPD  exacerbation-suspected to be secondary to underlying bronchitis.  As mentioned CT chest is negative for acute pulmonary embolism but does show a right upper lobe possible soft tissue mass.  Patient has no clinical symptoms of pneumonia. -We will treat the patient with IV steroids scheduled duo nebs, Pulmicort nebs, will give empiric doxycycline.  3.  Abnormal CT chest-as mentioned above patient had a right upper lobe soft tissue mass. - This likely needs to be further followed up as an outpatient.  Patient will need a repeat CT chest in the next month or so.  4.  Hyperlipidemia-continue atorvastatin.  5.  Essential hypertension-continue Cardizem.  6.  Depression-continue Effexor  7.  History of urinary incontinence-continue Ditropan.  All the records are reviewed and case discussed with ED  provider. Management plans discussed with the patient, family and they are in agreement.  CODE STATUS: Full code  TOTAL TIME TAKING CARE OF THIS PATIENT: 45 minutes.    Houston Siren M.D on 08/02/2018 at 11:09 AM  Between 7am to 6pm - Pager - [phone]  After 6pm go to [email or web address] - password [email or web address] [facility]  [facility]  Office  [phone]  CC: Primary care physician; Oswaldo Conroy, MD

## 2018-08-02 NOTE — ED Triage Notes (Signed)
Pt arrived via EMS from home where she woke with breathing difficulty. Pt with hx/o COPD and chronic O2 at 3L. Ems reports pt at 80% on 3l on arrival and pt at 77% coming into ED. Pt received 2 albuterol nebulizer and 125mg  solumedrol in route. MD at bedside and respiratory called.

## 2018-08-02 NOTE — ED Notes (Signed)
Pt taken off Bipap and placed on 4 Liters O2 via [place]. Pt O2 sats maintaining between 88-93 %. Pt has hx/o COPD

## 2018-08-02 NOTE — ED Notes (Signed)
Pt given ice chips, OK per Dr. Scotty CourtStafford

## 2018-08-02 NOTE — ED Notes (Signed)
Bed not clean at this time.  Floor estimates 20 minutes before bed is ready.

## 2018-08-02 NOTE — Progress Notes (Signed)
CODE SEPSIS - PHARMACY COMMUNICATION  **Broad Spectrum Antibiotics should be administered within 1 hour of Sepsis diagnosis**  Time Code Sepsis Called/Page Received: 08/02/18 0715  Antibiotics Ordered: Azith/Cefepime  Time of 1st antibiotic administration: 0729  Additional action taken by pharmacy:    If necessary, Name of Provider/Nurse Contacted:      Angelique BlonderMerrill,Laquisha Northcraft A ,PharmD Clinical Pharmacist  08/02/2018  7:32 AM

## 2018-08-02 NOTE — ED Notes (Signed)
Pt given meal tray, ok per Dr. Cherlynn KaiserSainani

## 2018-08-02 NOTE — Progress Notes (Signed)
Pt transported to CT on BiPAP with RN without incident. BiPAP plugged into red outlet and connected to oxygen outlet on wall.

## 2018-08-02 NOTE — ED Notes (Signed)
Not yet given...tried x2

## 2018-08-02 NOTE — ED Notes (Signed)
Pt denies pain; states chest is slightly tight but much better than earlier today; requested some ice; resp: still on 4L O2; wheezing & rhonchi-like on auscultation. Updated on room change to 148.

## 2018-08-02 NOTE — Plan of Care (Signed)

## 2018-08-03 LAB — BASIC METABOLIC PANEL
Anion gap: 11 (ref 5–15)
BUN: 22 mg/dL (ref 8–23)
CO2: 32 mmol/L (ref 22–32)
Calcium: 9.7 mg/dL (ref 8.9–10.3)
Chloride: 99 mmol/L (ref 98–111)
Creatinine, Ser: 1.33 mg/dL — ABNORMAL HIGH (ref 0.44–1.00)
GFR calc non Af Amer: 41 mL/min — ABNORMAL LOW (ref >60)
GFR, EST AFRICAN AMERICAN: 47 mL/min — AB (ref >60)
Glucose, Bld: 250 mg/dL — ABNORMAL HIGH (ref 70–99)
Potassium: 4.4 mmol/L (ref 3.5–5.1)
Sodium: 142 mmol/L (ref 135–145)

## 2018-08-03 LAB — CBC
HCT: 43.6 % (ref 36.0–46.0)
Hemoglobin: 13.6 g/dL (ref 12.0–15.0)
MCH: 28.6 pg (ref 26.0–34.0)
MCHC: 31.2 g/dL (ref 30.0–36.0)
MCV: 91.6 fL (ref 80.0–100.0)
Platelets: 242 10*3/uL (ref 150–400)
RBC: 4.76 MIL/uL (ref 3.87–5.11)
RDW: 13.9 % (ref 11.5–15.5)
WBC: 22 10*3/uL — ABNORMAL HIGH (ref 4.0–10.5)
nRBC: 0 % (ref 0.0–0.2)

## 2018-08-03 LAB — URINE CULTURE: CULTURE: NO GROWTH

## 2018-08-03 NOTE — Progress Notes (Signed)
[facility] - [place] at [facility]   PATIENT NAME: Audrey Sexton    MR#:  [id number]  DATE OF BIRTH:  April 23, 1951  SUBJECTIVE: Admitted for COPD exacerbation,, states that she is feeling better than yesterday.  CHIEF COMPLAINT:   Chief Complaint  Patient presents with  . Respiratory Distress    REVIEW OF SYSTEMS:   ROS CONSTITUTIONAL: No fever, fatigue or weakness.  EYES: No blurred or double vision.  EARS, NOSE, AND THROAT: No tinnitus or ear pain.  RESPIRATORY: No cough mild of breath, wheezing or hemoptysis.  CARDIOVASCULAR: No chest pain, orthopnea, edema.  GASTROINTESTINAL: No nausea, vomiting, diarrhea or abdominal pain.  GENITOURINARY: No dysuria, hematuria.  ENDOCRINE: No polyuria, nocturia,  HEMATOLOGY: No anemia, easy bruising or bleeding SKIN: No rash or lesion. MUSCULOSKELETAL: No joint pain or arthritis.   NEUROLOGIC: No tingling, numbness, weakness.  PSYCHIATRY: No anxiety or depression.   DRUG ALLERGIES:   Allergies  Allergen Reactions  . Percocet [Oxycodone-Acetaminophen] Nausea And Vomiting  . Vicodin [Hydrocodone-Acetaminophen] Nausea And Vomiting  . Penicillins Hives    Has patient had a PCN reaction causing immediate rash, facial/tongue/throat swelling, SOB or lightheadedness with hypotension: no Has patient had a PCN reaction causing severe rash involving mucus membranes or skin necrosis: no Has patient had a PCN reaction that required hospitalization  no Has patient had a PCN reaction occurring within the last 10 years: no If all of the above answers are "NO", then may proceed with Cephalosporin use.     VITALS:  Blood pressure (!) 131/58, pulse 100, temperature 97.9 F (36.6 C), temperature source Oral, resp. rate 18, height 5\' 2"  (1.575 m), weight 93.4 kg, SpO2 96 %.  PHYSICAL EXAMINATION:  GENERAL:  67 y.o.-year-old patient lying in the bed with no acute distress.  EYES: Pupils equal, round, reactive to light and  accommodation. No scleral icterus. Extraocular muscles intact.  HEENT: Head atraumatic, normocephalic. Oropharynx and nasopharynx clear.  NECK:  Supple, no jugular venous distention. No thyroid enlargement, no tenderness.  LUNGS: Normal breath sounds bilaterally, no wheezing, rales,rhonchi or crepitation. No use of accessory muscles of respiration.  CARDIOVASCULAR: S1, S2 normal. No murmurs, rubs, or gallops.  ABDOMEN: Soft, nontender, nondistended. Bowel sounds present. No organomegaly or mass.  EXTREMITIES: No pedal edema, cyanosis, or clubbing.  NEUROLOGIC: Cranial nerves II through XII are intact. Muscle strength 5/5 in all extremities. Sensation intact. Gait not checked.  PSYCHIATRIC: The patient is alert and oriented x 3.  SKIN: No obvious rash, lesion, or ulcer.    LABORATORY PANEL:   CBC Recent Labs  Lab 08/03/18 0318  WBC 22.0*  HGB 13.6  HCT 43.6  PLT 242   ------------------------------------------------------------------------------------------------------------------  Chemistries  Recent Labs  Lab 08/03/18 0318  NA 142  K 4.4  CL 99  CO2 32  GLUCOSE 250*  BUN 22  CREATININE 1.33*  CALCIUM 9.7   ------------------------------------------------------------------------------------------------------------------  Cardiac Enzymes Recent Labs  Lab 08/02/18 0711  TROPONINI <0.03   ------------------------------------------------------------------------------------------------------------------  RADIOLOGY:  Ct Angio Chest Pe W And/or Wo Contrast  Result Date: 08/02/2018 CLINICAL DATA:  Chronic dyspnea. EXAM: CT ANGIOGRAPHY CHEST WITH CONTRAST TECHNIQUE: Multidetector CT imaging of the chest was performed using the standard protocol during bolus administration of intravenous contrast. Multiplanar CT image reconstructions and MIPs were obtained to evaluate the vascular anatomy. CONTRAST:  23mL OMNIPAQUE IOHEXOL 350 MG/ML SOLN COMPARISON:  CT scan of August 17, 2016.  Radiograph of same day. FINDINGS: Cardiovascular: Satisfactory opacification of the  pulmonary arteries to the segmental level. No evidence of pulmonary embolism. Normal heart size. No pericardial effusion. Atherosclerosis of thoracic aorta is noted without aneurysm or dissection. Mediastinum/Nodes: No enlarged mediastinal, hilar, or axillary lymph nodes. Thyroid gland, trachea, and esophagus demonstrate no significant findings. Lungs/Pleura: No pneumothorax or pleural effusion is noted. Emphysematous disease is noted in the upper lobes bilaterally. Stable scarring is noted in right lower lobe. Scarring is also noted in the right upper lobe. However, new irregular soft tissue abnormality measuring 18 x 17 mm is noted laterally in the right upper lobe; is uncertain if this represents scarring, inflammation or possibly neoplasm. Upper Abdomen: No acute abnormality. Musculoskeletal: No chest wall abnormality. No acute or significant osseous findings. Review of the MIP images confirms the above findings. IMPRESSION: No definite evidence of pulmonary embolus. New irregular soft tissue density measuring 18 mm is noted laterally in the right upper lobe; it is uncertain if this represents pneumonia or possibly malignancy. Clinical correlation as well as unenhanced chest CT in 2-3 weeks is recommended to ensure resolution. If this abnormality persists on follow-up CT scan, PET scan is recommended to evaluate for possible malignancy. Otherwise stable scarring is noted in right upper and lower lobes. Aortic Atherosclerosis (ICD10-I70.0) and Emphysema (ICD10-J43.9). Electronically Signed   By: Lupita Raider, M.D.   On: 08/02/2018 09:47   Dg Chest Portable 1 View  Result Date: 08/02/2018 CLINICAL DATA:  Difficulty breathing and shortness of breath EXAM: PORTABLE CHEST 1 VIEW COMPARISON:  06/09/2017 FINDINGS: Cardiac shadow is within normal limits. The lungs are well aerated bilaterally. Chronic scarring is again  noted in the right lung base. No acute infiltrate is noted. IMPRESSION: Chronic scarring without acute abnormality. Electronically Signed   By: Alcide Clever M.D.   On: 08/02/2018 07:27    EKG:   Orders placed or performed during the hospital encounter of 08/02/18  . ED EKG  . ED EKG  . EKG 12-Lead  . EKG 12-Lead    ASSESSMENT AND PLAN:   #1 acute on chronic respiratory failure due to COPD evaluation: Clinically improving on IV steroids, bronchodilators.  Now down to 1/2 L of oxygen.  Patient is chronically on 2.5 L to 3 L of oxygen all the time.  #2 .possible right upper lobe mass: Repeat CT chest as an outpatient in 3 to 4 weeks.  Patient with smoking long time ago..    All the records are reviewed and case discussed with Care Management/Social Workerr. Management plans discussed with the patient, family and they are in agreement.  CODE STATUS: Full code  TOTAL TIME TAKING CARE OF THIS PATIENT: 35 minutes.   POSSIBLE D/C IN 1-2DAYS, DEPENDING ON CLINICAL CONDITION.   Katha Hamming M.D on 08/03/2018 at 3:28 PM  Between 7am to 6pm - Pager - [phone]  After 6pm go to [email or web address] - password [email or web address] [facility]  [place] [facility]  Office  [phone]  CC: Primary care physician; Oswaldo Conroy, MD   Note: This dictation was prepared with Dragon dictation along with smaller phrase technology. Any transcriptional errors that result from this process are unintentional.

## 2018-08-03 NOTE — Progress Notes (Signed)
Resumed care from Brooke RN at 0000   

## 2018-08-03 NOTE — Plan of Care (Signed)

## 2018-08-03 NOTE — Progress Notes (Signed)
Patient resting in bed. O2 at 2L. No complaints. Call bell in reach. Continue to monitor.

## 2018-08-03 NOTE — Progress Notes (Signed)
RT to patient bedside for 0200 breathing treatment. Patient reported she had experienced a bloody nose earlier and told her RN. Patient was previously on 4L [place]. Currently on 2L [place] SAT at 92%. Nose is not bleeding at this time. Patient placed on humidified 2L [place] after treatment.

## 2018-08-04 MED ORDER — DOXYCYCLINE HYCLATE 100 MG PO TABS: 100.0000 mg | ORAL_TABLET | Freq: Two times a day (BID) | ORAL | 0 refills | Status: DC

## 2018-08-04 MED ORDER — LEVOFLOXACIN 500 MG PO TABS: 500.0000 mg | ORAL_TABLET | Freq: Every day | ORAL | 0 refills | Status: AC

## 2018-08-04 MED ORDER — PREDNISONE 10 MG (21) PO TBPK: ORAL_TABLET | ORAL | 0 refills | Status: DC

## 2018-08-04 NOTE — Progress Notes (Signed)
Patient is stable for discharge.  Instructions on the computer.  Advised to continue her albuterol nebs that she has at home every 4 hours for couple of days after that she can resume her usual dose.  She is chronically on 3 L of oxygen, continue that Soft tissue density in the right upper lobe, patient needs CT of the chest in 2 to 3 weeks to ensure resolution, same discussed with patient, patient sees Dr. Hessie DienerBender her PCP on Tuesday, also has appointment with Dr. Meredeth IdeFleming..Marland Kitchen

## 2018-08-05 LAB — HIV ANTIBODY (ROUTINE TESTING W REFLEX): HIV Screen 4th Generation wRfx: NONREACTIVE

## 2018-08-05 NOTE — Discharge Summary (Signed)
Audrey Sexton, is a 67 y.o. female  DOB 1951-08-08  MRN [id number].  Admission date:  08/02/2018  Admitting Physician  Houston SirenVivek J Sainani, MD  Discharge Date:  08/04/2018   Primary MD  Oswaldo ConroyBender, Abby Daneele, MD  Recommendations for primary care physician for things to follow:   Follow with PCP in 3 to 4 days.  Patient already has appointment with PCP on December 10.   Admission Diagnosis  Aortic atherosclerosis (HCC) [I70.0] COPD exacerbation (HCC) [J44.1] Acute on chronic respiratory failure with hypoxia (HCC) [J96.21] Pulmonary emphysema, unspecified emphysema type (HCC) [J43.9]   Discharge Diagnosis  Aortic atherosclerosis (HCC) [I70.0] COPD exacerbation (HCC) [J44.1] Acute on chronic respiratory failure with hypoxia (HCC) [J96.21] Pulmonary emphysema, unspecified emphysema type (HCC) [J43.9]    Active Problems:   COPD exacerbation (HCC)      Past Medical History:  Diagnosis Date  . Asthma   . Carpal tunnel syndrome of left wrist   . COPD (chronic obstructive pulmonary disease) (HCC)   . Hyperlipemia   . Hypertension     Past Surgical History:  Procedure Laterality Date  . CARPAL TUNNEL RELEASE Left   . fractured tibia Left    repair  . ROTATOR CUFF REPAIR Left   . TUBAL LIGATION         History of present illness and  Hospital Course:     Kindly see H&P for history of present illness and admission details, please review complete Labs, Consult reports and Test reports for all details in brief  HPI  from the history and physical done on the day of admission 67 year old female with chronic respiratory failure on oxygen 2 to 3 L at home comes in because of shortness of breath, nonproductive cough and admitted for COPD exacerbation.  Patient initially required BiPAP and was able to come off the BiPAP and  admitted to regular medical floor.  Hospital Course  : Acute on chronic respiratory failure secondary to COPD exacerbation.  Initially required BiPAP but later on changed to nasal cannula in the emergency room.  Patient symptoms improved with IV steroids, nebulizer treatment, empiric antibiotics.  Discharged home with tapering course of prednisone, Levaquin.  Advised her to continue oxygen that she was using 2 to 3 L.  Patient was a heavy smoker but quit.  CT angios chest did not show any PE.  Patient can continue her albuterol nebs every 4 hours for next 2 days and then resume home dose. 2.  Abnormal CT chest: CT of the chest showed soft tissue mass in the right upper lobe, patient can follow-up with Dr. Meredeth IdeFleming and have repeat CT chest in a month.  Patient understands this.  She already has appointment lined up to see Dr. Meredeth IdeFleming for her COPD. 3.  Hyperlipidemia: Continue statins 4.  Essential hypertension: Continue Cardizem 5.  History of depression: Continue Effexor, she is tells me that her PCP changed her depression medicine 3 to 4 weeks ago.  Patient is on Effexor now. #6. history of urinary incontinence: Continue Ditropan.     Discharge Condition: Stable   Follow UP      Discharge Instructions  and  Discharge Medications      Allergies as of 08/04/2018      Reactions   Percocet [oxycodone-acetaminophen] Nausea And Vomiting   Vicodin [hydrocodone-acetaminophen] Nausea And Vomiting   Penicillins Hives   Has patient had a PCN reaction causing immediate rash, facial/tongue/throat swelling, SOB or lightheadedness with hypotension: no Has patient had  a PCN reaction causing severe rash involving mucus membranes or skin necrosis: no Has patient had a PCN reaction that required hospitalization  no Has patient had a PCN reaction occurring within the last 10 years: no If all of the above answers are "NO", then may proceed with Cephalosporin use.      Medication List    STOP taking  these medications   acetaminophen 500 MG tablet Commonly known as:  TYLENOL     TAKE these medications   albuterol 108 (90 Base) MCG/ACT inhaler Commonly known as:  PROVENTIL HFA;VENTOLIN HFA Inhale 2-4 puffs by mouth every 4 hours as needed for wheezing, cough, and/or shortness of breath   atorvastatin 40 MG tablet Commonly known as:  LIPITOR Take 40 mg by mouth daily.   diltiazem 240 MG 24 hr capsule Commonly known as:  CARDIZEM CD Take 240 mg by mouth daily.   fluticasone furoate-vilanterol 100-25 MCG/INH Aepb Commonly known as:  BREO ELLIPTA Inhale 1 puff into the lungs daily.   furosemide 40 MG tablet Commonly known as:  LASIX Take 40 mg by mouth daily.   INCRUSE ELLIPTA 62.5 MCG/INH Aepb Generic drug:  umeclidinium bromide Inhale 1 puff into the lungs daily.   levofloxacin 500 MG tablet Commonly known as:  LEVAQUIN Take 1 tablet (500 mg total) by mouth daily for 10 days.   MULTI-VITAMIN DAILY PO Take 1 tablet by mouth daily.   oxybutynin 5 MG tablet Commonly known as:  DITROPAN Take 5 mg by mouth 3 (three) times daily.   potassium chloride SA 20 MEQ tablet Commonly known as:  K-DUR,KLOR-CON Take 1 tablet (20 mEq total) by mouth daily.   predniSONE 10 MG (21) Tbpk tablet Commonly known as:  STERAPRED UNI-PAK 21 TAB Taper by 10 mg daily What changed:  additional instructions   venlafaxine XR 37.5 MG 24 hr capsule Commonly known as:  EFFEXOR-XR Take 112.5 mg by mouth daily.   vitamin B-12 1000 MCG tablet Commonly known as:  CYANOCOBALAMIN Take 1,000 mcg by mouth daily.   vitamin C 1000 MG tablet Take 1,000 mg by mouth daily.   VITAMIN D-1000 MAX ST 25 MCG (1000 UT) tablet Generic drug:  Cholecalciferol Take 1,000 Units by mouth daily.         Diet and Activity recommendation: See Discharge Instructions above   Consults obtained - none   Major procedures and Radiology Reports - PLEASE review detailed and final reports for all details, in  brief -      Ct Angio Chest Pe W And/or Wo Contrast  Result Date: 08/02/2018 CLINICAL DATA:  Chronic dyspnea. EXAM: CT ANGIOGRAPHY CHEST WITH CONTRAST TECHNIQUE: Multidetector CT imaging of the chest was performed using the standard protocol during bolus administration of intravenous contrast. Multiplanar CT image reconstructions and MIPs were obtained to evaluate the vascular anatomy. CONTRAST:  60mL OMNIPAQUE IOHEXOL 350 MG/ML SOLN COMPARISON:  CT scan of August 17, 2016.  Radiograph of same day. FINDINGS: Cardiovascular: Satisfactory opacification of the pulmonary arteries to the segmental level. No evidence of pulmonary embolism. Normal heart size. No pericardial effusion. Atherosclerosis of thoracic aorta is noted without aneurysm or dissection. Mediastinum/Nodes: No enlarged mediastinal, hilar, or axillary lymph nodes. Thyroid gland, trachea, and esophagus demonstrate no significant findings. Lungs/Pleura: No pneumothorax or pleural effusion is noted. Emphysematous disease is noted in the upper lobes bilaterally. Stable scarring is noted in right lower lobe. Scarring is also noted in the right upper lobe. However, new irregular soft tissue abnormality measuring 18  x 17 mm is noted laterally in the right upper lobe; is uncertain if this represents scarring, inflammation or possibly neoplasm. Upper Abdomen: No acute abnormality. Musculoskeletal: No chest wall abnormality. No acute or significant osseous findings. Review of the MIP images confirms the above findings. IMPRESSION: No definite evidence of pulmonary embolus. New irregular soft tissue density measuring 18 mm is noted laterally in the right upper lobe; it is uncertain if this represents pneumonia or possibly malignancy. Clinical correlation as well as unenhanced chest CT in 2-3 weeks is recommended to ensure resolution. If this abnormality persists on follow-up CT scan, PET scan is recommended to evaluate for possible malignancy. Otherwise  stable scarring is noted in right upper and lower lobes. Aortic Atherosclerosis (ICD10-I70.0) and Emphysema (ICD10-J43.9). Electronically Signed   By: Lupita Raider, M.D.   On: 08/02/2018 09:47   Dg Chest Portable 1 View  Result Date: 08/02/2018 CLINICAL DATA:  Difficulty breathing and shortness of breath EXAM: PORTABLE CHEST 1 VIEW COMPARISON:  06/09/2017 FINDINGS: Cardiac shadow is within normal limits. The lungs are well aerated bilaterally. Chronic scarring is again noted in the right lung base. No acute infiltrate is noted. IMPRESSION: Chronic scarring without acute abnormality. Electronically Signed   By: Alcide Clever M.D.   On: 08/02/2018 07:27    Micro Results     Recent Results (from the past 240 hour(s))  Blood Culture (routine x 2)     Status: None (Preliminary result)   Collection Time: 08/02/18  7:27 AM  Result Value Ref Range Status   Specimen Description BLOOD LT HAND  Final   Special Requests   Final    BOTTLES DRAWN AEROBIC AND ANAEROBIC Blood Culture adequate volume   Culture   Final    NO GROWTH 3 DAYS Performed at [facility], [address], [place], [place] [address]    Report Status PENDING  Incomplete  Blood Culture (routine x 2)     Status: None (Preliminary result)   Collection Time: 08/02/18  7:27 AM  Result Value Ref Range Status   Specimen Description BLOOD RT [facility]  Final   Special Requests   Final    BOTTLES DRAWN AEROBIC AND ANAEROBIC Blood Culture results may not be optimal due to an excessive volume of blood received in culture bottles   Culture   Final    NO GROWTH 3 DAYS Performed at [facility], [address] [place], [place] [address]    Report Status PENDING  Incomplete  Urine culture     Status: None   Collection Time: 08/02/18 10:36 AM  Result Value Ref Range Status   Specimen Description   Final    URINE, RANDOM Performed at [facility], [address] [place], [place] [address]    Special Requests    Final    NONE Performed at [facility], [address] [place], [place] [address]    Culture   Final    NO GROWTH Performed at [facility], [address] [place], [place] [address]    Report Status 08/03/2018 FINAL  Final       Today   Subjective:   Audrey Sexton today has no headache,no chest abdominal pain,no new weakness tingling or numbness, feels much better wants to go home today.   Objective:   Blood pressure 124/69, pulse 86, temperature 98.2 F (36.8 C), temperature source Oral, resp. rate 16, height 5\' 2"  (1.575 m), weight 93.4 kg, SpO2 96 %.  No intake or output data in the 24 hours ending 08/05/18 1119  Exam Awake Alert, Oriented x 3, No new F.N deficits, Normal affect Branch.AT,PERRAL Supple Neck,No JVD, No cervical lymphadenopathy appriciated.  Symmetrical Chest wall movement, Good air movement bilaterally, CTAB RRR,No Gallops,Rubs or new Murmurs, No Parasternal Heave +ve B.Sounds, Abd Soft, Non tender, No organomegaly appriciated, No rebound -guarding or rigidity. No Cyanosis, Clubbing or edema, No new Rash or bruise  Data Review   CBC w Diff:  Lab Results  Component Value Date   WBC 22.0 (H) 08/03/2018   HGB 13.6 08/03/2018   HGB 9.8 (L) 06/24/2014   HCT 43.6 08/03/2018   HCT 30.3 (L) 06/24/2014   PLT 242 08/03/2018   PLT 319 06/24/2014   LYMPHOPCT 15 08/02/2018   LYMPHOPCT 13.2 06/24/2014   MONOPCT 9 08/02/2018   MONOPCT 8.4 06/24/2014   EOSPCT 0 08/02/2018   EOSPCT 2.8 06/24/2014   BASOPCT 0 08/02/2018   BASOPCT 0.5 06/24/2014    CMP:  Lab Results  Component Value Date   NA 142 08/03/2018   NA 140 06/24/2014   K 4.4 08/03/2018   K 3.8 06/24/2014   CL 99 08/03/2018   CL 100 06/24/2014   CO2 32 08/03/2018   CO2 34 (H) 06/24/2014   BUN 22 08/03/2018   BUN 14 06/24/2014   CREATININE 1.33 (H) 08/03/2018   CREATININE 1.15 06/24/2014   PROT 6.9 06/09/2017   PROT 7.1 06/13/2014   ALBUMIN 3.8 06/09/2017   ALBUMIN  1.8 (L) 06/18/2014   BILITOT 0.5 06/09/2017   BILITOT 0.4 06/13/2014   ALKPHOS 54 06/09/2017   ALKPHOS 57 06/13/2014   AST 49 (H) 06/09/2017   AST 28 06/13/2014   ALT 41 06/09/2017   ALT 39 06/13/2014  .   Total Time in preparing paper work, data evaluation and todays exam - 35 minutes  Katha Hamming M.D on 08/04/2018 at 11:19 AM    Note: This dictation was prepared with Dragon dictation along with smaller phrase technology. Any transcriptional errors that result from this process are unintentional.

## 2018-08-07 LAB — CULTURE, BLOOD (ROUTINE X 2)
CULTURE: NO GROWTH
Culture: NO GROWTH
Special Requests: ADEQUATE

## 2018-09-05 ENCOUNTER — Emergency Department: Payer: Medicare Other

## 2018-09-05 ENCOUNTER — Inpatient Hospital Stay
Admission: EM | Admit: 2018-09-05 | Discharge: 2018-09-11 | DRG: 065 | Disposition: A | Discharge status: Skilled Nursing Facility | Payer: Medicare Other | Attending: Specialist | Admitting: Specialist

## 2018-09-05 ENCOUNTER — Other Ambulatory Visit: Payer: Self-pay

## 2018-09-05 ENCOUNTER — Encounter: Payer: Self-pay | Admitting: *Deleted

## 2018-09-05 DIAGNOSIS — Z9981 Dependence on supplemental oxygen: Secondary | ICD-10-CM

## 2018-09-05 DIAGNOSIS — I639 Cerebral infarction, unspecified: Secondary | ICD-10-CM | POA: Diagnosis not present

## 2018-09-05 DIAGNOSIS — R32 Unspecified urinary incontinence: Secondary | ICD-10-CM | POA: Diagnosis present

## 2018-09-05 DIAGNOSIS — R29898 Other symptoms and signs involving the musculoskeletal system: Secondary | ICD-10-CM

## 2018-09-05 DIAGNOSIS — Z7951 Long term (current) use of inhaled steroids: Secondary | ICD-10-CM

## 2018-09-05 DIAGNOSIS — R29702 NIHSS score 2: Secondary | ICD-10-CM | POA: Diagnosis present

## 2018-09-05 DIAGNOSIS — Z87891 Personal history of nicotine dependence: Secondary | ICD-10-CM

## 2018-09-05 DIAGNOSIS — G8191 Hemiplegia, unspecified affecting right dominant side: Secondary | ICD-10-CM | POA: Diagnosis present

## 2018-09-05 DIAGNOSIS — E119 Type 2 diabetes mellitus without complications: Secondary | ICD-10-CM | POA: Diagnosis present

## 2018-09-05 DIAGNOSIS — R4020 Unspecified coma: Secondary | ICD-10-CM

## 2018-09-05 DIAGNOSIS — Z880 Allergy status to penicillin: Secondary | ICD-10-CM

## 2018-09-05 DIAGNOSIS — Z79899 Other long term (current) drug therapy: Secondary | ICD-10-CM

## 2018-09-05 DIAGNOSIS — E785 Hyperlipidemia, unspecified: Secondary | ICD-10-CM | POA: Diagnosis present

## 2018-09-05 DIAGNOSIS — J449 Chronic obstructive pulmonary disease, unspecified: Secondary | ICD-10-CM | POA: Diagnosis present

## 2018-09-05 DIAGNOSIS — Z8673 Personal history of transient ischemic attack (TIA), and cerebral infarction without residual deficits: Secondary | ICD-10-CM

## 2018-09-05 DIAGNOSIS — M810 Age-related osteoporosis without current pathological fracture: Secondary | ICD-10-CM | POA: Diagnosis present

## 2018-09-05 DIAGNOSIS — F329 Major depressive disorder, single episode, unspecified: Secondary | ICD-10-CM | POA: Diagnosis present

## 2018-09-05 DIAGNOSIS — Z885 Allergy status to narcotic agent status: Secondary | ICD-10-CM

## 2018-09-05 DIAGNOSIS — I10 Essential (primary) hypertension: Secondary | ICD-10-CM | POA: Diagnosis present

## 2018-09-05 HISTORY — DX: Cerebral infarction, unspecified: I63.9

## 2018-09-05 LAB — TROPONIN I

## 2018-09-05 LAB — CBC
HCT: 46.9 % — ABNORMAL HIGH (ref 36.0–46.0)
HEMOGLOBIN: 14.6 g/dL (ref 12.0–15.0)
MCH: 28.9 pg (ref 26.0–34.0)
MCHC: 31.1 g/dL (ref 30.0–36.0)
MCV: 92.7 fL (ref 80.0–100.0)
Platelets: 262 10*3/uL (ref 150–400)
RBC: 5.06 MIL/uL (ref 3.87–5.11)
RDW: 13.6 % (ref 11.5–15.5)
WBC: 11.5 10*3/uL — ABNORMAL HIGH (ref 4.0–10.5)
nRBC: 0 % (ref 0.0–0.2)

## 2018-09-05 LAB — URINALYSIS, COMPLETE (UACMP) WITH MICROSCOPIC
Bacteria, UA: NONE SEEN
Bilirubin Urine: NEGATIVE
Glucose, UA: NEGATIVE mg/dL
Hgb urine dipstick: NEGATIVE
Ketones, ur: NEGATIVE mg/dL
Leukocytes, UA: NEGATIVE
Nitrite: NEGATIVE
Protein, ur: NEGATIVE mg/dL
Specific Gravity, Urine: 1.023 (ref 1.005–1.030)
pH: 5 (ref 5.0–8.0)

## 2018-09-05 LAB — BASIC METABOLIC PANEL
Anion gap: 8 (ref 5–15)
BUN: 15 mg/dL (ref 8–23)
CO2: 32 mmol/L (ref 22–32)
Calcium: 9.5 mg/dL (ref 8.9–10.3)
Chloride: 102 mmol/L (ref 98–111)
Creatinine, Ser: 1.16 mg/dL — ABNORMAL HIGH (ref 0.44–1.00)
GFR calc Af Amer: 56 mL/min — ABNORMAL LOW (ref >60)
GFR calc non Af Amer: 48 mL/min — ABNORMAL LOW (ref >60)
Glucose, Bld: 152 mg/dL — ABNORMAL HIGH (ref 70–99)
POTASSIUM: 4 mmol/L (ref 3.5–5.1)
Sodium: 142 mmol/L (ref 135–145)

## 2018-09-05 NOTE — ED Notes (Signed)
Pt stated that she feel earlier while trying to use the bathroom. Pt stated that she does not remember falling or how she feel. Pt stated that her right leg feels heavy at times when she is trying to move it. Pt is A&Ox4 at this time. Denies any chest pain.

## 2018-09-05 NOTE — ED Notes (Signed)
Pt is off the floor in medical imaging.

## 2018-09-05 NOTE — ED Provider Notes (Signed)
[facility] Emergency Department Provider Note  ____________________________________________   First MD Initiated Contact with Patient 09/05/18 2044     (approximate)  I have reviewed the triage vital signs and the nursing notes.   HISTORY  Chief Complaint Fall and Loss of Consciousness   HPI Audrey Sexton is a 68 y.o. female with a history of asthma, COPD and hypertension on 3 L nasal cannula oxygen her baseline was presented emergency department today after passing out.  Says that she was in her bathroom grabbing onto the bar next to her "commode" when she says that everything went black.  She is unsure of how long she was unconscious but says that when she woke up she had weakness in her right lower extremity.  States that she does not usually have any weakness and does not use a walker or cane or any other device to help her ambulate.  Denies any headache, neck pain, chest or bilateral lower extremity pain.  Denies any palpitation or lightheadedness prior to the fall.    Past Medical History:  Diagnosis Date  . Asthma   . Carpal tunnel syndrome of left wrist   . COPD (chronic obstructive pulmonary disease) (HCC)   . Hyperlipemia   . Hypertension     Patient Active Problem List   Diagnosis Date Noted  . Acute respiratory failure with hypoxia (HCC) 06/09/2017  . Acute respiratory distress   . DNR (do not resuscitate) discussion 08/23/2016  . Palliative care by specialist 08/23/2016  . Dyspnea 08/23/2016  . COPD exacerbation (HCC) 08/18/2016    Past Surgical History:  Procedure Laterality Date  . CARPAL TUNNEL RELEASE Left   . fractured tibia Left    repair  . ROTATOR CUFF REPAIR Left   . TUBAL LIGATION      Prior to Admission medications   Medication Sig Start Date End Date Taking? Authorizing Provider  albuterol (PROVENTIL HFA;VENTOLIN HFA) 108 (90 Base) MCG/ACT inhaler Inhale 2-4 puffs by mouth every 4 hours as needed for wheezing,  cough, and/or shortness of breath 01/01/17   Loleta RoseForbach, Cory, MD  Ascorbic Acid (VITAMIN C) 1000 MG tablet Take 1,000 mg by mouth daily.    [provider]  atorvastatin (LIPITOR) 40 MG tablet Take 40 mg by mouth daily.    [provider]  Cholecalciferol (VITAMIN D-1000 MAX ST) 1000 units tablet Take 1,000 Units by mouth daily.    [provider]  diltiazem (CARDIZEM CD) 240 MG 24 hr capsule Take 240 mg by mouth daily.    [provider]  fluticasone furoate-vilanterol (BREO ELLIPTA) 100-25 MCG/INH AEPB Inhale 1 puff into the lungs daily. 06/14/17   Milagros LollSudini, Srikar, MD  furosemide (LASIX) 40 MG tablet Take 40 mg by mouth daily.    [provider]  Multiple Vitamin (MULTI-VITAMIN DAILY PO) Take 1 tablet by mouth daily.    [provider]  oxybutynin (DITROPAN) 5 MG tablet Take 5 mg by mouth 3 (three) times daily.     [provider]  potassium chloride SA (K-DUR,KLOR-CON) 20 MEQ tablet Take 1 tablet (20 mEq total) by mouth daily. 06/15/17   Milagros LollSudini, Srikar, MD  predniSONE (STERAPRED UNI-PAK 21 TAB) 10 MG (21) TBPK tablet Taper by 10 mg daily 08/04/18   Katha HammingKonidena, Snehalatha, MD  umeclidinium bromide (INCRUSE ELLIPTA) 62.5 MCG/INH AEPB Inhale 1 puff into the lungs daily.    [provider]  venlafaxine XR (EFFEXOR-XR) 37.5 MG 24 hr capsule Take 112.5 mg by  mouth daily.  07/28/18   [provider]  vitamin B-12 (CYANOCOBALAMIN) 1000 MCG tablet Take 1,000 mcg by mouth daily.     [provider]    Allergies Percocet [oxycodone-acetaminophen]; Vicodin [hydrocodone-acetaminophen]; and Penicillins  Family History  Problem Relation Age of Onset  . Heart disease Mother        died at 84  . Diabetes Mother   . Alzheimer's disease Father        died at 27  . Parkinson's disease Sister   . Heart disease Brother        died at 29  . Diabetes Brother   . Kidney disease Sister   . Breast cancer Sister     Social  History Social History   Tobacco Use  . Smoking status: Former Smoker    Packs/day: 1.00    Years: 40.00    Pack years: 40.00    Types: Cigarettes  . Smokeless tobacco: Never Used  Substance Use Topics  . Alcohol use: No  . Drug use: No    Review of Systems  Constitutional: No fever/chills Eyes: No visual changes. ENT: No sore throat. Cardiovascular: Denies chest pain. Respiratory: Denies shortness of breath. Gastrointestinal: No abdominal pain.  No nausea, no vomiting.  No diarrhea.  No constipation. Genitourinary: Negative for dysuria. Musculoskeletal: Negative for back pain. Skin: Negative for rash. Neurological: As above   ____________________________________________   PHYSICAL EXAM:  VITAL SIGNS: ED Triage Vitals [09/05/18 1609]  Enc Vitals Group     BP (!) 155/81     Pulse Rate 90     Resp 18     Temp 97.8 F (36.6 C)     Temp Source Oral     SpO2 98 %     Weight 216 lb 14.9 oz (98.4 kg)     Height 5\' 4"  (1.626 m)     Head Circumference      Peak Flow      Pain Score 0     Pain Loc      Pain Edu?      Excl. in GC?     Constitutional: Alert and oriented. Well appearing and in no acute distress. Eyes: Conjunctivae are normal.  Head: Atraumatic. Nose: No congestion/rhinnorhea. Mouth/Throat: Mucous membranes are moist.  Neck: No stridor.   Cardiovascular: Normal rate, regular rhythm. Grossly normal heart sounds.  Good peripheral circulation with equal and bilateral dorsalis pedis pulses. Respiratory: Normal respiratory effort.  No retractions. Lungs CTAB. Gastrointestinal: Soft and nontender. No distention.  Musculoskeletal: No lower extremity tenderness nor edema.  No joint effusions. Neurologic:  Normal speech and language.   5 out of 5 strength to the left lower extremity.  3 out of 5 strength right lower extremity.  No tenderness to palpation over the bilateral hips.  No limb shortening.  Sensation is intact to light touch.  Otherwise the neuro  exam is nonfocal.  Skin:  Skin is warm, dry and intact. No rash noted. Psychiatric: Mood and affect are normal. Speech and behavior are normal.  NIH Stroke Scale   Person Administering Scale: Arelia Longest  Administer stroke scale items in the order listed. Record performance in each category after each subscale exam. Do not go back and change scores. Follow directions provided for each exam technique. Scores should reflect what the patient does, not what the clinician thinks the patient can do. The clinician should record answers while administering the exam and work quickly. Except where indicated, the patient  should not be coached (i.e., repeated requests to patient to make a special effort).   1a  Level of consciousness: 0=alert; keenly responsive  1b. LOC questions:  0=Performs both tasks correctly  1c. LOC commands: 0=Performs both tasks correctly  2.  Best Gaze: 0=normal  3.  Visual: 0=No visual loss  4. Facial Palsy: 0=Normal symmetric movement  5a.  Motor left arm: 0=No drift, limb holds 90 (or 45) degrees for full 10 seconds  5b.  Motor right arm: 0=No drift, limb holds 90 (or 45) degrees for full 10 seconds  6a. motor left leg: 0=No drift, limb holds 90 (or 45) degrees for full 10 seconds  6b  Motor right leg:  2=Some effort against gravity, limb cannot get to or maintain (if cured) 90 (or 45) degrees, drifts down to bed, but has some effort against gravity  7. Limb Ataxia: 0=Absent  8.  Sensory: 0=Normal; no sensory loss  9. Best Language:  0=No aphasia, normal  10. Dysarthria: 0=Normal  11. Extinction and Inattention: 0=No abnormality  12. Distal motor function: 0=Normal   Total:   2   ____________________________________________   LABS (all labs ordered are listed, but only abnormal results are displayed)  Labs Reviewed  BASIC METABOLIC PANEL - Abnormal; Notable for the following components:      Result Value   Glucose, Bld 152 (*)    Creatinine, Ser 1.16 (*)     GFR calc non Af Amer 48 (*)    GFR calc Af Amer 56 (*)    All other components within normal limits  CBC - Abnormal; Notable for the following components:   WBC 11.5 (*)    HCT 46.9 (*)    All other components within normal limits  URINALYSIS, COMPLETE (UACMP) WITH MICROSCOPIC - Abnormal; Notable for the following components:   Color, Urine YELLOW (*)    APPearance CLEAR (*)    All other components within normal limits  TROPONIN I   ____________________________________________  EKG  ED ECG REPORT I, Arelia Longestavid M Schaevitz, the attending physician, personally viewed and interpreted this ECG.   Date: 09/05/2018  EKG Time: 1618  Rate: 90  Rhythm: normal sinus rhythm  Axis: Normal  Intervals:none  ST&T Change: No ST segment elevation or depression.  No abnormal T wave inversion.  ____________________________________________  RADIOLOGY  CT head with chronic moderate small vessel ischemic disease without acute finding.  Chest x-ray with shallow inspiration with linear atelectasis in the lung bases.  Pelvic x-ray without acute bony abnormality. ____________________________________________   PROCEDURES  Procedure(s) performed:   Procedures  Critical Care performed:   ____________________________________________   INITIAL IMPRESSION / ASSESSMENT AND PLAN / ED COURSE  Pertinent labs & imaging results that were available during my care of the patient were reviewed by me and considered in my medical decision making (see chart for details).  DDX: CVA, syncope, electrolyte abnormality, arrhythmia As part of my medical decision making, I reviewed the following data within the electronic MEDICAL RECORD NUMBER Notes from prior ED visits  ----------------------------------------- 11:28 PM on 09/05/2018 -----------------------------------------  According the triage note it appears the patient was last normal approximately 3 PM today.  Patient had a prolonged stay in the waiting  room.  Out of the window for TPA at this time.  Will give aspirin.  Will be admitted for cardiac monitoring in addition to MRI for new onset right lower extremity weakness.  Patient understanding the diagnosis as well as treatment and willing to comply.  Signed out to Dr. Anne Hahn.  ____________________________________________   FINAL CLINICAL IMPRESSION(S) / ED DIAGNOSES  Loss of consciousness.  Right lower extremity weakness.  NEW MEDICATIONS STARTED DURING THIS VISIT:  New Prescriptions   No medications on file     Note:  This document was prepared using Dragon voice recognition software and may include unintentional dictation errors.     Myrna Blazer, MD 09/05/18 2329

## 2018-09-05 NOTE — ED Triage Notes (Signed)
Pt to ED after a fall at home. PT did not trip but does not remember the fall specifically. Pt unsure if she had a syncopal episode. No head trauma. Skin abrasion to the right arm.   BP is elevated and CBG with EMS was 169.   Pt wearing 3L oxygen chronically. PT reports she had been having trouble with her right leg for the past hour that it has not been as strong as normal. No weakness in right arm. No facial droop. No neuro deficits.

## 2018-09-06 ENCOUNTER — Observation Stay: Payer: Medicare Other

## 2018-09-06 DIAGNOSIS — F329 Major depressive disorder, single episode, unspecified: Secondary | ICD-10-CM | POA: Diagnosis present

## 2018-09-06 DIAGNOSIS — Z79899 Other long term (current) drug therapy: Secondary | ICD-10-CM | POA: Diagnosis not present

## 2018-09-06 DIAGNOSIS — I10 Essential (primary) hypertension: Secondary | ICD-10-CM | POA: Diagnosis present

## 2018-09-06 DIAGNOSIS — Z885 Allergy status to narcotic agent status: Secondary | ICD-10-CM | POA: Diagnosis not present

## 2018-09-06 DIAGNOSIS — Z87891 Personal history of nicotine dependence: Secondary | ICD-10-CM | POA: Diagnosis not present

## 2018-09-06 DIAGNOSIS — E119 Type 2 diabetes mellitus without complications: Secondary | ICD-10-CM | POA: Diagnosis present

## 2018-09-06 DIAGNOSIS — Z880 Allergy status to penicillin: Secondary | ICD-10-CM | POA: Diagnosis not present

## 2018-09-06 DIAGNOSIS — E785 Hyperlipidemia, unspecified: Secondary | ICD-10-CM | POA: Diagnosis present

## 2018-09-06 DIAGNOSIS — I639 Cerebral infarction, unspecified: Secondary | ICD-10-CM | POA: Diagnosis present

## 2018-09-06 DIAGNOSIS — J449 Chronic obstructive pulmonary disease, unspecified: Secondary | ICD-10-CM | POA: Diagnosis present

## 2018-09-06 DIAGNOSIS — R29898 Other symptoms and signs involving the musculoskeletal system: Secondary | ICD-10-CM | POA: Diagnosis present

## 2018-09-06 DIAGNOSIS — G8191 Hemiplegia, unspecified affecting right dominant side: Secondary | ICD-10-CM | POA: Diagnosis present

## 2018-09-06 DIAGNOSIS — R32 Unspecified urinary incontinence: Secondary | ICD-10-CM | POA: Diagnosis present

## 2018-09-06 DIAGNOSIS — Z9981 Dependence on supplemental oxygen: Secondary | ICD-10-CM | POA: Diagnosis not present

## 2018-09-06 DIAGNOSIS — M810 Age-related osteoporosis without current pathological fracture: Secondary | ICD-10-CM | POA: Diagnosis present

## 2018-09-06 DIAGNOSIS — Z8673 Personal history of transient ischemic attack (TIA), and cerebral infarction without residual deficits: Secondary | ICD-10-CM | POA: Diagnosis not present

## 2018-09-06 DIAGNOSIS — Z7951 Long term (current) use of inhaled steroids: Secondary | ICD-10-CM | POA: Diagnosis not present

## 2018-09-06 DIAGNOSIS — R29702 NIHSS score 2: Secondary | ICD-10-CM | POA: Diagnosis present

## 2018-09-06 LAB — CREATININE, SERUM
Creatinine, Ser: 1.09 mg/dL — ABNORMAL HIGH (ref 0.44–1.00)
GFR calc Af Amer: 60 mL/min — ABNORMAL LOW (ref >60)
GFR calc non Af Amer: 52 mL/min — ABNORMAL LOW (ref >60)

## 2018-09-06 LAB — LIPID PANEL
Cholesterol: 130 mg/dL (ref 0–200)
HDL: 33 mg/dL — ABNORMAL LOW (ref >40)
LDL Cholesterol: 68 mg/dL (ref 0–99)
Total CHOL/HDL Ratio: 3.9 RATIO
Triglycerides: 146 mg/dL (ref <150)
VLDL: 29 mg/dL (ref 0–40)

## 2018-09-06 LAB — CBC
HCT: 43.7 % (ref 36.0–46.0)
Hemoglobin: 13.7 g/dL (ref 12.0–15.0)
MCH: 28.6 pg (ref 26.0–34.0)
MCHC: 31.4 g/dL (ref 30.0–36.0)
MCV: 91.2 fL (ref 80.0–100.0)
Platelets: 228 10*3/uL (ref 150–400)
RBC: 4.79 MIL/uL (ref 3.87–5.11)
RDW: 13.6 % (ref 11.5–15.5)
WBC: 10.2 10*3/uL (ref 4.0–10.5)
nRBC: 0 % (ref 0.0–0.2)

## 2018-09-06 MED ORDER — ACETAMINOPHEN 650 MG RE SUPP: 650.0000 mg | RECTAL | Status: DC | PRN

## 2018-09-06 MED ORDER — OXYBUTYNIN CHLORIDE 5 MG PO TABS
5.0000 mg | ORAL_TABLET | Freq: Three times a day (TID) | ORAL | Status: DC
  Administered 2018-09-06 – 2018-09-11 (×14): 5 mg via ORAL
  Filled 2018-09-06 (×17): qty 1

## 2018-09-06 MED ORDER — ACETAMINOPHEN 160 MG/5ML PO SOLN
650.0000 mg | ORAL | Status: DC | PRN
  Filled 2018-09-06: qty 20.3

## 2018-09-06 MED ORDER — ATORVASTATIN CALCIUM 20 MG PO TABS
40.0000 mg | ORAL_TABLET | Freq: Every day | ORAL | Status: DC
  Administered 2018-09-06 – 2018-09-10 (×5): 40 mg via ORAL
  Filled 2018-09-06 (×5): qty 2

## 2018-09-06 MED ORDER — VENLAFAXINE HCL ER 75 MG PO CP24
150.0000 mg | ORAL_CAPSULE | Freq: Every day | ORAL | Status: DC
  Administered 2018-09-06 – 2018-09-10 (×5): 150 mg via ORAL
  Filled 2018-09-06 (×3): qty 2
  Filled 2018-09-06: qty 1
  Filled 2018-09-06 (×2): qty 2

## 2018-09-06 MED ORDER — ACETAMINOPHEN 325 MG PO TABS
650.0000 mg | ORAL_TABLET | ORAL | Status: DC | PRN
  Administered 2018-09-06 – 2018-09-10 (×2): 650 mg via ORAL
  Filled 2018-09-06 (×2): qty 2

## 2018-09-06 MED ORDER — STROKE: EARLY STAGES OF RECOVERY BOOK: Freq: Once | Status: DC

## 2018-09-06 MED ORDER — ENOXAPARIN SODIUM 40 MG/0.4ML ~~LOC~~ SOLN
40.0000 mg | SUBCUTANEOUS | Status: DC
  Administered 2018-09-06 – 2018-09-11 (×6): 40 mg via SUBCUTANEOUS
  Filled 2018-09-06 (×6): qty 0.4

## 2018-09-06 MED ORDER — FLUTICASONE FUROATE-VILANTEROL 100-25 MCG/INH IN AEPB
1.0000 | INHALATION_SPRAY | Freq: Every day | RESPIRATORY_TRACT | Status: DC
  Administered 2018-09-08 – 2018-09-11 (×4): 1 via RESPIRATORY_TRACT
  Filled 2018-09-06 (×2): qty 28

## 2018-09-06 MED ORDER — ORAL CARE MOUTH RINSE
15.0000 mL | Freq: Two times a day (BID) | OROMUCOSAL | Status: DC
  Administered 2018-09-06 – 2018-09-10 (×7): 15 mL via OROMUCOSAL

## 2018-09-06 MED ORDER — UMECLIDINIUM BROMIDE 62.5 MCG/INH IN AEPB
1.0000 | INHALATION_SPRAY | Freq: Every day | RESPIRATORY_TRACT | Status: DC
  Administered 2018-09-08 – 2018-09-11 (×4): 1 via RESPIRATORY_TRACT
  Filled 2018-09-06 (×2): qty 7

## 2018-09-06 MED ORDER — ASPIRIN 81 MG PO CHEW
324.0000 mg | CHEWABLE_TABLET | Freq: Once | ORAL | Status: AC
  Administered 2018-09-06: 324 mg via ORAL
  Filled 2018-09-06: qty 4

## 2018-09-06 NOTE — Care Management Obs Status (Signed)
MEDICARE OBSERVATION STATUS NOTIFICATION   Patient Details  Name: Audrey Sexton MRN: [id number] Date of Birth: Oct 05, 1950   Medicare Observation Status Notification Given:  Yes    Analeese Andreatta A Sydnei Ohaver, RN 09/06/2018, 1:09 PM

## 2018-09-06 NOTE — H&P (Signed)
[facility] [place] [facility]   PATIENT NAME: Audrey Sexton    MR#:  [id number]  DATE OF BIRTH:  [birth year]  DATE OF ADMISSION:  09/05/2018  PRIMARY CARE PHYSICIAN: Oswaldo Conroy, MD   REQUESTING/REFERRING PHYSICIAN: Pershing Proud, MD  CHIEF COMPLAINT:   Chief Complaint  Patient presents with  . Fall  . Loss of Consciousness    HISTORY OF PRESENT ILLNESS:  Audrey Sexton  is a 68 y.o. female who presents with chief complaint as above.  Patient states that she went to use her bathroom tonight, and then woke up on the floor.  She does not recall what happened or how long she was unconscious.  When she woke up she had weakness in her right lower extremity, this is new for her.  Here in the ED her work-up was initially within normal limits, but her leg weakness is persistent.  Hospitalist called for admission and work-up  PAST MEDICAL HISTORY:   Past Medical History:  Diagnosis Date  . Asthma   . Carpal tunnel syndrome of left wrist   . COPD (chronic obstructive pulmonary disease) (HCC)   . Hyperlipemia   . Hypertension      PAST SURGICAL HISTORY:   Past Surgical History:  Procedure Laterality Date  . CARPAL TUNNEL RELEASE Left   . fractured tibia Left    repair  . ROTATOR CUFF REPAIR Left   . TUBAL LIGATION       SOCIAL HISTORY:   Social History   Tobacco Use  . Smoking status: Former Smoker    Packs/day: 1.00    Years: 40.00    Pack years: 40.00    Types: Cigarettes  . Smokeless tobacco: Never Used  Substance Use Topics  . Alcohol use: No     FAMILY HISTORY:   Family History  Problem Relation Age of Onset  . Heart disease Mother        died at 16  . Diabetes Mother   . Alzheimer's disease Father        died at 72  . Parkinson's disease Sister   . Heart disease Brother        died at 6  . Diabetes Brother   . Kidney disease Sister   . Breast cancer Sister      DRUG ALLERGIES:   Allergies  Allergen  Reactions  . Percocet [Oxycodone-Acetaminophen] Nausea And Vomiting  . Vicodin [Hydrocodone-Acetaminophen] Nausea And Vomiting  . Penicillins Hives    Has patient had a PCN reaction causing immediate rash, facial/tongue/throat swelling, SOB or lightheadedness with hypotension: no Has patient had a PCN reaction causing severe rash involving mucus membranes or skin necrosis: no Has patient had a PCN reaction that required hospitalization  no Has patient had a PCN reaction occurring within the last 10 years: no If all of the above answers are "NO", then may proceed with Cephalosporin use.     MEDICATIONS AT HOME:   Prior to Admission medications   Medication Sig Start Date End Date Taking? Authorizing Provider  albuterol (PROVENTIL HFA;VENTOLIN HFA) 108 (90 Base) MCG/ACT inhaler Inhale 2-4 puffs by mouth every 4 hours as needed for wheezing, cough, and/or shortness of breath 01/01/17  Yes Loleta Rose, MD  Ascorbic Acid (VITAMIN C) 1000 MG tablet Take 1,000 mg by mouth daily.   Yes [provider]  atorvastatin (LIPITOR) 40 MG tablet Take 40 mg by mouth at bedtime.    Yes [provider]  Cholecalciferol (VITAMIN  D-1000 MAX ST) 1000 units tablet Take 1,000 Units by mouth daily.   Yes [provider]  diltiazem (CARDIZEM CD) 240 MG 24 hr capsule Take 240 mg by mouth at bedtime.    Yes [provider]  fluticasone furoate-vilanterol (BREO ELLIPTA) 100-25 MCG/INH AEPB Inhale 1 puff into the lungs daily. 06/14/17  Yes Sudini, Wardell Heath, MD  furosemide (LASIX) 40 MG tablet Take 40 mg by mouth daily.   Yes [provider]  Multiple Vitamin (MULTI-VITAMIN DAILY PO) Take 1 tablet by mouth daily.   Yes [provider]  oxybutynin (DITROPAN) 5 MG tablet Take 5 mg by mouth 3 (three) times daily.    Yes [provider]  potassium chloride SA (K-DUR,KLOR-CON) 20 MEQ tablet Take 1 tablet (20 mEq total) by mouth daily. 06/15/17  Yes Sudini, Wardell Heath, MD   umeclidinium bromide (INCRUSE ELLIPTA) 62.5 MCG/INH AEPB Inhale 1 puff into the lungs daily.   Yes [provider]  venlafaxine XR (EFFEXOR-XR) 150 MG 24 hr capsule Take 150 mg by mouth at bedtime.  07/28/18  Yes [provider]  vitamin B-12 (CYANOCOBALAMIN) 1000 MCG tablet Take 1,000 mcg by mouth daily.    Yes [provider]  predniSONE (STERAPRED UNI-PAK 21 TAB) 10 MG (21) TBPK tablet Taper by 10 mg daily Patient not taking: Reported on 09/05/2018 08/04/18   Katha Hamming, MD    REVIEW OF SYSTEMS:  Review of Systems  Constitutional: Negative for chills, fever, malaise/fatigue and weight loss.  HENT: Negative for ear pain, hearing loss and tinnitus.   Eyes: Negative for blurred vision, double vision, pain and redness.  Respiratory: Negative for cough, hemoptysis and shortness of breath.   Cardiovascular: Negative for chest pain, palpitations, orthopnea and leg swelling.  Gastrointestinal: Negative for abdominal pain, constipation, diarrhea, nausea and vomiting.  Genitourinary: Negative for dysuria, frequency and hematuria.  Musculoskeletal: Negative for back pain, joint pain and neck pain.  Skin:       No acne, rash, or lesions  Neurological: Positive for focal weakness and loss of consciousness. Negative for dizziness and tremors.  Endo/Heme/Allergies: Negative for polydipsia. Does not bruise/bleed easily.  Psychiatric/Behavioral: Negative for depression. The patient is not nervous/anxious and does not have insomnia.      VITAL SIGNS:   Vitals:   09/05/18 1609  BP: (!) 155/81  Pulse: 90  Resp: 18  Temp: 97.8 F (36.6 C)  TempSrc: Oral  SpO2: 98%  Weight: 98.4 kg  Height: 5\' 4"  (1.626 m)   Wt Readings from Last 3 Encounters:  09/05/18 98.4 kg  08/02/18 93.4 kg  06/11/17 95.8 kg    PHYSICAL EXAMINATION:  Physical Exam  Vitals reviewed. Constitutional: She is oriented to person, place, and time. She appears well-developed and  well-nourished. No distress.  HENT:  Head: Normocephalic and atraumatic.  Mouth/Throat: Oropharynx is clear and moist.  Eyes: Pupils are equal, round, and reactive to light. Conjunctivae and EOM are normal. No scleral icterus.  Neck: Normal range of motion. Neck supple. No JVD present. No thyromegaly present.  Cardiovascular: Normal rate, regular rhythm and intact distal pulses. Exam reveals no gallop and no friction rub.  No murmur heard. Respiratory: Effort normal and breath sounds normal. No respiratory distress. She has no wheezes. She has no rales.  GI: Soft. Bowel sounds are normal. She exhibits no distension. There is no abdominal tenderness.  Musculoskeletal: Normal range of motion.        General: No edema.     Comments: No  arthritis, no gout  Lymphadenopathy:    She has no cervical adenopathy.  Neurological: She is alert and oriented to person, place, and time. No cranial nerve deficit.  Neurologic: Cranial nerves II-XII intact, Sensation intact to light touch/pinprick, 5/5 strength in all extremities except for right lower extremity which has 2/5 strength, no dysarthria, no aphasia, no dysphagia, memory intact, finger to nose testing showed no abnormality, no pronator drift  Skin: Skin is warm and dry. No rash noted. No erythema.  Psychiatric: She has a normal mood and affect. Her behavior is normal. Judgment and thought content normal.    LABORATORY PANEL:   CBC Recent Labs  Lab 09/05/18 1623  WBC 11.5*  HGB 14.6  HCT 46.9*  PLT 262   ------------------------------------------------------------------------------------------------------------------  Chemistries  Recent Labs  Lab 09/05/18 1623  NA 142  K 4.0  CL 102  CO2 32  GLUCOSE 152*  BUN 15  CREATININE 1.16*  CALCIUM 9.5   ------------------------------------------------------------------------------------------------------------------  Cardiac Enzymes Recent Labs  Lab 09/05/18 1948  TROPONINI <0.03    ------------------------------------------------------------------------------------------------------------------  RADIOLOGY:  Dg Chest 1 View  Result Date: 09/05/2018 CLINICAL DATA:  Weakness and pain after a fall. EXAM: CHEST  1 VIEW COMPARISON:  08/02/2018 FINDINGS: Shallow inspiration with linear atelectasis in the lung bases. Heart size and pulmonary vascularity are normal. 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 No airspace disease or consolidation. No blunting of costophrenic angles. No pneumothorax. Calcified and tortuous aorta. Degenerative changes in the spine and shoulders. Similar appearance to previous study. IMPRESSION: Shallow inspiration with linear atelectasis in the lung bases. Chronic fibrosis in the bases. No active pulmonary disease identified. Electronically Signed   By: Burman Nieves M.D.   On: 09/05/2018 23:10   Dg Pelvis 1-2 Views  Result Date: 09/05/2018 CLINICAL DATA:  Weakness and pain after a fall. EXAM: PELVIS - 1-2 VIEW COMPARISON:  Left hip 12/10/2007 FINDINGS: There is no evidence of pelvic fracture or diastasis. No pelvic bone lesions are seen. Mild degenerative changes in the hips and lower lumbar spine. IMPRESSION: No acute bony abnormalities.  Mild degenerative changes. Electronically Signed   By: Burman Nieves M.D.   On: 09/05/2018 23:10   Ct Head Wo Contrast  Result Date: 09/05/2018 CLINICAL DATA:  Patient fell earlier joint is a bathroom. Right leg weakness. EXAM: CT HEAD WITHOUT CONTRAST TECHNIQUE: Contiguous axial images were obtained from the base of the skull through the vertex without intravenous contrast. COMPARISON:  12/08/2015 FINDINGS: Brain: Chronic moderate small vessel ischemic disease of periventricular, deep and subcortical white matter more so along the frontoparietal regions. No acute large vascular territory appearing infarct, midline shift or edema. No acute intracranial hemorrhage or extra-axial fluid collections. No intra-axial mass. No  hydrocephalus. Midline fourth ventricle and basal cisterns without effacement. Vascular: Atherosclerosis of the vertebral arteries. No hyperdense vessel sign. Skull: Intact without fracture. Sinuses/Orbits: Unremarkable. Other: None IMPRESSION: Chronic moderate small vessel ischemic disease of periventricular, deep and subcortical white matter. No acute intracranial abnormality by CT noted. Electronically Signed   By: Tollie Eth M.D.   On: 09/05/2018 22:56    EKG:   Orders placed or performed during the hospital encounter of 09/05/18  . ED EKG  . ED EKG    IMPRESSION AND PLAN:  Principal Problem:   Right leg weakness -strong suspicion for stroke.  She was given aspirin in the ED.  Will admit per stroke admission order set with appropriate imaging, consults, labs. Active Problems:   COPD (chronic obstructive  pulmonary disease) (HCC) -continue home meds   HTN (hypertension) -permissive hypertension tonight, blood pressure goal less than 220/120   HLD (hyperlipidemia) -Home dose antilipid  Chart review performed and case discussed with ED provider. Labs, imaging and/or ECG reviewed by provider and discussed with patient/family. Management plans discussed with the patient and/or family.  DVT PROPHYLAXIS: SubQ lovenox   GI PROPHYLAXIS:  None  ADMISSION STATUS: Observation  CODE STATUS: Full Code Status History    Date Active Date Inactive Code Status Order ID Comments User Context   08/02/2018 1251 08/04/2018 1926 Full Code [id number]  Houston SirenSainani, Vivek J, MD Inpatient   08/18/2016 0130 08/24/2016 1926 Full Code [id number]  Hugelmeyer, Jon GillsAlexis, DO Inpatient      TOTAL TIME TAKING CARE OF THIS PATIENT: 40 minutes.   Baili Stang FIELDING 09/06/2018, 12:51 AM  [facility] [facility]  Office  [phone]  CC: Primary care physician; Oswaldo ConroyBender, Abby Daneele, MD  Note:  This document was prepared using Dragon voice recognition software and may include unintentional dictation  errors.

## 2018-09-06 NOTE — ED Notes (Signed)
Pt is going to MRI.

## 2018-09-06 NOTE — Plan of Care (Signed)
  Problem: Education: Goal: Knowledge of General Education information will improve Description Including pain rating scale, medication(s)/side effects and non-pharmacologic comfort measures Outcome: Progressing   Problem: Health Behavior/Discharge Planning: Goal: Ability to manage health-related needs will improve Outcome: Progressing   Problem: Clinical Measurements: Goal: Ability to maintain clinical measurements within normal limits will improve Outcome: Progressing Goal: Will remain free from infection Outcome: Progressing Goal: Diagnostic test results will improve Outcome: Progressing Goal: Respiratory complications will improve Outcome: Progressing Goal: Cardiovascular complication will be avoided Outcome: Progressing   Problem: Elimination: Goal: Will not experience complications related to bowel motility Outcome: Progressing Goal: Will not experience complications related to urinary retention Outcome: Progressing   Problem: Pain Managment: Goal: General experience of comfort will improve Outcome: Progressing   Problem: Safety: Goal: Ability to remain free from injury will improve Outcome: Progressing   Problem: Safety: Goal: Ability to remain free from injury will improve Outcome: Progressing   Problem: Skin Integrity: Goal: Risk for impaired skin integrity will decrease Outcome: Progressing

## 2018-09-06 NOTE — ED Notes (Signed)
Pt transported to room 103 

## 2018-09-06 NOTE — ED Notes (Signed)
Pt was given a boxed dinner and cup of orange juice.

## 2018-09-06 NOTE — Consult Note (Addendum)
Referring Physician: Altamese Dilling    Chief Complaint: Right leg weakness and syncope  HPI: Audrey Sexton is an 68 y.o. female with past medical history of COPD on home oxygen, hyperlipidemia, hypertension, diabetes, anxiety, osteoporosis, urinary incontinence and asthma presenting to the ED with syncopal episode and right leg weakness.  Patient does not clearly recall the events however she reports that she has been having trouble with her right leg for the past few hours preceding the fall. Patient state that she went to the bathroom but when she tried to sit on the commode, she suddenly felt imbalanced as if leaning to the right and fell on floor hitting the right side of her arm on the guard rail. Patient does not know how long she was on the floor or how she got to the ED. Denies associated symptoms preceding the syncope of aura, nausea and vomiting, feeling cold or clammy, visual auras or blurry vision, palpitations shortness of breath chest pain. She denies loss of bladder control or tongue biting. She report that when she came to she was unable to move her right leg. On arrival to the ED  Initial NIH stroke scale was 2.  Initial CT head was negative for acute intracranial abnormality.  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  Patient was therefore admitted for further full stroke work-up and management.  Date last known well: Date: 09/05/2018 Time last known well: Time: 15:00 tPA Given: No: outside window period  Past Medical History:  Diagnosis Date  . Asthma   . Carpal tunnel syndrome of left wrist   . COPD (chronic obstructive pulmonary disease) (HCC)   . Hyperlipemia   . Hypertension     Past Surgical History:  Procedure Laterality Date  . CARPAL TUNNEL RELEASE Left   . fractured tibia Left    repair  . ROTATOR CUFF REPAIR Left   . TUBAL LIGATION      Family History   Problem Relation Age of Onset  . Heart disease Mother        died at 19  . Diabetes Mother   . Alzheimer's disease Father        died at 59  . Parkinson's disease Sister   . Heart disease Brother        died at 40  . Diabetes Brother   . Kidney disease Sister   . Breast cancer Sister    Social History:  reports that she has quit smoking. Her smoking use included cigarettes. She has a 40.00 pack-year smoking history. She has never used smokeless tobacco. She reports that she does not drink alcohol or use drugs.  Allergies:  Allergies  Allergen Reactions  . Percocet [Oxycodone-Acetaminophen] Nausea And Vomiting  . Vicodin [Hydrocodone-Acetaminophen] Nausea And Vomiting  . Penicillins Hives    Has patient had a PCN reaction causing immediate rash, facial/tongue/throat swelling, SOB or lightheadedness with hypotension: no Has patient had a PCN reaction causing severe rash involving mucus membranes or skin necrosis: no Has patient had a PCN reaction that required hospitalization  no Has patient had a PCN reaction occurring within the last 10 years: no If all of the above answers are "NO", then may proceed with Cephalosporin use.     Medications:  I have reviewed the patient's current medications. Prior to Admission:  Medications Prior to Admission  Medication Sig Dispense Refill Last Dose  .  albuterol (PROVENTIL HFA;VENTOLIN HFA) 108 (90 Base) MCG/ACT inhaler Inhale 2-4 puffs by mouth every 4 hours as needed for wheezing, cough, and/or shortness of breath 1 Inhaler 1 Past Month at Unknown time  . Ascorbic Acid (VITAMIN C) 1000 MG tablet Take 1,000 mg by mouth daily.   09/05/2018 at Unknown time  . atorvastatin (LIPITOR) 40 MG tablet Take 40 mg by mouth at bedtime.    09/04/2018 at Unknown time  . Cholecalciferol (VITAMIN D-1000 MAX ST) 1000 units tablet Take 1,000 Units by mouth daily.   09/05/2018 at Unknown time  . diltiazem (CARDIZEM CD) 240 MG 24 hr capsule Take 240 mg by mouth at  bedtime.    09/04/2018 at Unknown time  . fluticasone furoate-vilanterol (BREO ELLIPTA) 100-25 MCG/INH AEPB Inhale 1 puff into the lungs daily.   09/05/2018 at Unknown time  . furosemide (LASIX) 40 MG tablet Take 40 mg by mouth daily.   09/05/2018 at Unknown time  . Multiple Vitamin (MULTI-VITAMIN DAILY PO) Take 1 tablet by mouth daily.   09/05/2018 at Unknown time  . oxybutynin (DITROPAN) 5 MG tablet Take 5 mg by mouth 3 (three) times daily.    09/05/2018 at Unknown time  . potassium chloride SA (K-DUR,KLOR-CON) 20 MEQ tablet Take 1 tablet (20 mEq total) by mouth daily. 15 tablet 0 09/05/2018 at Unknown time  . umeclidinium bromide (INCRUSE ELLIPTA) 62.5 MCG/INH AEPB Inhale 1 puff into the lungs daily.   09/05/2018 at Unknown time  . venlafaxine XR (EFFEXOR-XR) 150 MG 24 hr capsule Take 150 mg by mouth at bedtime.    09/04/2018 at Unknown time  . vitamin B-12 (CYANOCOBALAMIN) 1000 MCG tablet Take 1,000 mcg by mouth daily.    09/05/2018 at Unknown time  . predniSONE (STERAPRED UNI-PAK 21 TAB) 10 MG (21) TBPK tablet Taper by 10 mg daily (Patient not taking: Reported on 09/05/2018) 21 tablet 0 Not Taking at Unknown time   Scheduled: .  stroke: mapping our early stages of recovery book   Does not apply Once  . atorvastatin  40 mg Oral QHS  . enoxaparin (LOVENOX) injection  40 mg Subcutaneous Q24H  . fluticasone furoate-vilanterol  1 puff Inhalation Daily  . oxybutynin  5 mg Oral TID  . umeclidinium bromide  1 puff Inhalation Daily  . venlafaxine XR  150 mg Oral QHS    ROS: History obtained from the patient   General ROS: negative for - chills, fatigue, fever, night sweats, weight gain or weight loss Psychological ROS: negative for - behavioral disorder, hallucinations, memory difficulties, mood swings or suicidal ideation Ophthalmic ROS: negative for - blurry vision, double vision, eye pain or loss of vision ENT ROS: negative for - epistaxis, nasal discharge, oral lesions, sore throat, tinnitus or  vertigo Allergy and Immunology ROS: negative for - hives or itchy/watery eyes Hematological and Lymphatic ROS: negative for - bleeding problems, bruising or swollen lymph nodes Endocrine ROS: negative for - galactorrhea, hair pattern changes, polydipsia/polyuria or temperature intolerance Respiratory ROS: negative for - cough, hemoptysis, shortness of breath or wheezing Cardiovascular ROS: negative for - chest pain, dyspnea on exertion, edema or irregular heartbeat Gastrointestinal ROS: negative for - abdominal pain, diarrhea, hematemesis, nausea/vomiting or stool incontinence Genito-Urinary ROS: negative for - dysuria, hematuria, incontinence or urinary frequency/urgency Musculoskeletal ROS: negative for - joint swelling or muscular weakness Neurological ROS: as noted in HPI Dermatological ROS: negative for rash and skin lesion changes  Physical Examination: Blood pressure (!) 148/83, pulse 97, temperature 97.8 F (36.6 C),  temperature source Oral, resp. rate 16, height 5\' 4"  (1.626 m), weight 98.4 kg, SpO2 97 %.   HEENT-  Normocephalic, no lesions, without obvious abnormality.  Normal external eye and conjunctiva.  Normal TM's bilaterally.  Normal auditory canals and external ears. Normal external nose, mucus membranes and septum.  Normal pharynx. Cardiovascular- S1, S2 normal, pulses palpable throughout   Lungs- chest clear, no wheezing, rales, normal symmetric air entry Abdomen- soft, non-tender; bowel sounds normal; no masses,  no organomegaly Extremities- no edema Lymph-no adenopathy palpable Musculoskeletal-no joint tenderness, deformity or swelling Skin-warm and dry, no hyperpigmentation, vitiligo, or suspicious lesions  Neurological Exam   Mental Status: Alert, oriented, thought content appropriate.  Speech fluent without evidence of aphasia.  Able to follow 3 step commands without difficulty. Attention span and concentration seemed appropriate  Cranial Nerves: II: Discs flat  bilaterally; Visual fields grossly normal, pupils equal, round, reactive to light and accommodation III,IV, VI: ptosis not present, extra-ocular motions intact bilaterally V,VII: smile symmetric, facial light touch sensation intact VIII: hearing normal bilaterally IX,X: gag reflex present XI: bilateral shoulder shrug XII: midline tongue extension Motor: Right :  Upper extremity 4+/5 with pronator drift     Left: Upper extremity 5/5 Right:   Lower extremity   1/5                                   Left: Lower extremity   5/5  Sensory: Pinprick and light touch decreased on the right Deep Tendon Reflexes: 2+ and symmetric throughout Plantars: Right: mute                              Left: mute Cerebellar: Finger to nose testing intact bilaterally. Unable to perform heel to chin testing due to weakness. Gait: not tested due to safety concerns  Data Reviewed  Laboratory Studies:  Basic Metabolic Panel: Recent Labs  Lab 09/05/18 1623 09/06/18 0529  NA 142  --   K 4.0  --   CL 102  --   CO2 32  --   GLUCOSE 152*  --   BUN 15  --   CREATININE 1.16* 1.09*  CALCIUM 9.5  --     Liver Function Tests: No results for input(s): AST, ALT, ALKPHOS, BILITOT, PROT, ALBUMIN in the last 168 hours. No results for input(s): LIPASE, AMYLASE in the last 168 hours. No results for input(s): AMMONIA in the last 168 hours.  CBC: Recent Labs  Lab 09/05/18 1623 09/06/18 0529  WBC 11.5* 10.2  HGB 14.6 13.7  HCT 46.9* 43.7  MCV 92.7 91.2  PLT 262 228    Cardiac Enzymes: Recent Labs  Lab 09/05/18 1948  TROPONINI <0.03    BNP: Invalid input(s): POCBNP  CBG: No results for input(s): GLUCAP in the last 168 hours.  Microbiology: Results for orders placed or performed during the hospital encounter of 08/02/18  Blood Culture (routine x 2)     Status: None   Collection Time: 08/02/18  7:27 AM  Result Value Ref Range Status   Specimen Description BLOOD LT HAND  Final   Special Requests    Final    BOTTLES DRAWN AEROBIC AND ANAEROBIC Blood Culture adequate volume   Culture   Final    NO GROWTH 5 DAYS Performed at [facility], [address] [place], [place] [id number]    Report  Status 08/07/2018 FINAL  Final  Blood Culture (routine x 2)     Status: None   Collection Time: 08/02/18  7:27 AM  Result Value Ref Range Status   Specimen Description BLOOD RT [facility]  Final   Special Requests   Final    BOTTLES DRAWN AEROBIC AND ANAEROBIC Blood Culture results may not be optimal due to an excessive volume of blood received in culture bottles   Culture   Final    NO GROWTH 5 DAYS Performed at [facility], [address], [place], [place] [address]    Report Status 08/07/2018 FINAL  Final  Urine culture     Status: None   Collection Time: 08/02/18 10:36 AM  Result Value Ref Range Status   Specimen Description   Final    URINE, RANDOM Performed at [facility], [address] [place], [place] [address]    Special Requests   Final    NONE Performed at [facility], [address] [place], [place] [address]    Culture   Final    NO GROWTH Performed at [facility], [address] [place], [place] [address]    Report Status 08/03/2018 FINAL  Final    Coagulation Studies: No results for input(s): LABPROT, INR in the last 72 hours.  Urinalysis:  Recent Labs  Lab 09/05/18 2107  COLORURINE YELLOW*  LABSPEC 1.023  PHURINE 5.0  GLUCOSEU NEGATIVE  HGBUR NEGATIVE  BILIRUBINUR NEGATIVE  KETONESUR NEGATIVE  PROTEINUR NEGATIVE  NITRITE NEGATIVE  LEUKOCYTESUR NEGATIVE    Lipid Panel:    Component Value Date/Time   CHOL 130 09/06/2018 0529   TRIG 146 09/06/2018 0529   HDL 33 (L) 09/06/2018 0529   CHOLHDL 3.9 09/06/2018 0529   VLDL 29 09/06/2018 0529   LDLCALC 68 09/06/2018 0529    HgbA1C:  Lab Results  Component Value Date   HGBA1C 5.9 (H) 06/11/2017    Urine Drug Screen:  No results found for: LABOPIA,  COCAINSCRNUR, LABBENZ, AMPHETMU, THCU, LABBARB  Alcohol Level: No results for input(s): ETH in the last 168 hours.  Other results: EKG: normal EKG, normal sinus rhythm, unchanged from previous tracings. Vent. rate 90 BPM PR interval 168 ms QRS duration 78 ms QT/QTc 392/479 ms P-R-T axes 79 79 108  Imaging: Dg Chest 1 View  Result Date: 09/05/2018 CLINICAL DATA:  Weakness and pain after a fall. EXAM: CHEST  1 VIEW COMPARISON:  08/02/2018 FINDINGS: Shallow inspiration with linear atelectasis in the lung bases. Heart size and pulmonary vascularity are normal. 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 No airspace disease or consolidation. No blunting of costophrenic angles. No pneumothorax. Calcified and tortuous aorta. Degenerative changes in the spine and shoulders. Similar appearance to previous study. IMPRESSION: Shallow inspiration with linear atelectasis in the lung bases. Chronic fibrosis in the bases. No active pulmonary disease identified. Electronically Signed   By: Burman Nieves M.D.   On: 09/05/2018 23:10   Dg Pelvis 1-2 Views  Result Date: 09/05/2018 CLINICAL DATA:  Weakness and pain after a fall. EXAM: PELVIS - 1-2 VIEW COMPARISON:  Left hip 12/10/2007 FINDINGS: There is no evidence of pelvic fracture or diastasis. No pelvic bone lesions are seen. Mild degenerative changes in the hips and lower lumbar spine. IMPRESSION: No acute bony abnormalities.  Mild degenerative changes. Electronically Signed   By: Burman Nieves M.D.   On: 09/05/2018 23:10   Ct Head Wo Contrast  Result Date: 09/05/2018 CLINICAL DATA:  Patient fell  earlier joint is a bathroom. Right leg weakness. EXAM: CT HEAD WITHOUT CONTRAST TECHNIQUE: Contiguous axial images were obtained from the base of the skull through the vertex without intravenous contrast. COMPARISON:  12/08/2015 FINDINGS: Brain: Chronic moderate small vessel ischemic disease of periventricular, deep and subcortical white matter more so along the  frontoparietal regions. No acute large vascular territory appearing infarct, midline shift or edema. No acute intracranial hemorrhage or extra-axial fluid collections. No intra-axial mass. No hydrocephalus. Midline fourth ventricle and basal cisterns without effacement. Vascular: Atherosclerosis of the vertebral arteries. No hyperdense vessel sign. Skull: Intact without fracture. Sinuses/Orbits: Unremarkable. Other: None IMPRESSION: Chronic moderate small vessel ischemic disease of periventricular, deep and subcortical white matter. No acute intracranial abnormality by CT noted. Electronically Signed   By: Tollie Eth M.D.   On: 09/05/2018 22:56   Mr Brain Wo Contrast  Result Date: 09/06/2018 CLINICAL DATA:  Focal neuro deficit with stroke suspected. Syncope with right lower extremity weakness EXAM: MRI HEAD WITHOUT CONTRAST MRA HEAD WITHOUT CONTRAST TECHNIQUE: Multiplanar, multiecho pulse sequences of the brain and surrounding structures were obtained without intravenous contrast. Angiographic images of the head were obtained using MRA technique without contrast. 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 Tiny weakly restricted diffusion focus in the upper right putamen on coronal acquisition. Background of moderate chronic small vessel ischemic change in the cerebral white matter. No hemorrhage, hydrocephalus, or masslike finding. Vascular: Arterial findings below. Normal dural venous sinus flow voids. Skull and upper cervical spine: No focal marrow lesion. Left-sided facet spurring. 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 Sinuses/Orbits: Partial right mastoid opacification. Negative nasopharynx. Other: Motion degraded study requiring fast protocol technique. MRA HEAD FINDINGS Carotid and vertebral arteries are smooth and diffusely patent. No branch occlusion, beading, or aneurysm. Mild  motion degradation. IMPRESSION: 1. Small acute infarct in the left corona radiata. 2. Tiny acute or subacute infarct in the right putamen. 3. Background of moderate chronic small vessel ischemia. 4. Negative intracranial MRA with mild motion artifact. Electronically Signed   By: Marnee Spring M.D.   On: 09/06/2018 07:14   US Carotid Bilateral (at [facility] Only)  Result Date: 09/06/2018 CLINICAL DATA:  Right leg weakness for 1 day EXAM: BILATERAL CAROTID DUPLEX ULTRASOUND TECHNIQUE: Wallace Cullens scale imaging, color Doppler and duplex ultrasound were performed of bilateral carotid and vertebral arteries in the neck. COMPARISON:  None. FINDINGS: Criteria: Quantification of carotid stenosis is based on velocity parameters that correlate the residual internal carotid diameter with NASCET-based stenosis levels, using the diameter of the distal internal carotid lumen as the denominator for stenosis measurement. The following velocity measurements were obtained: RIGHT ICA: 63 cm/sec CCA: 61 cm/sec SYSTOLIC ICA/CCA RATIO:  1.0 ECA: 85 cm/sec LEFT ICA: 85 cm/sec CCA: 53 cm/sec SYSTOLIC ICA/CCA RATIO:  1.6 ECA: 64 cm/sec RIGHT CAROTID ARTERY: Mild smooth calcified plaque in the bulb. Low resistance internal carotid Doppler pattern. RIGHT VERTEBRAL ARTERY:  Antegrade. LEFT CAROTID ARTERY: Mild calcified plaque along the wall of the bulb. Low resistance internal carotid Doppler pattern is preserved. LEFT VERTEBRAL ARTERY:  Antegrade. IMPRESSION: Less than 50% stenosis in the right and left internal carotid arteries. Electronically Signed   By: Jolaine Click M.D.   On: 09/06/2018 09:00   Mr Maxine Glenn Head/brain WN Cm  Result Date: 09/06/2018 CLINICAL DATA:  Focal neuro deficit with stroke suspected. Syncope with right lower extremity weakness  EXAM: MRI HEAD WITHOUT CONTRAST MRA HEAD WITHOUT CONTRAST TECHNIQUE: Multiplanar, multiecho pulse sequences of the brain and surrounding structures were obtained without intravenous contrast.  Angiographic images of the head were obtained using MRA technique without contrast. 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 Tiny weakly restricted diffusion focus in the upper right putamen on coronal acquisition. Background of moderate chronic small vessel ischemic change in the cerebral white matter. No hemorrhage, hydrocephalus, or masslike finding. Vascular: Arterial findings below. Normal dural venous sinus flow voids. Skull and upper cervical spine: No focal marrow lesion. Left-sided facet spurring. 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 Sinuses/Orbits: Partial right mastoid opacification. Negative nasopharynx. Other: Motion degraded study requiring fast protocol technique. MRA HEAD FINDINGS Carotid and vertebral arteries are smooth and diffusely patent. No branch occlusion, beading, or aneurysm. Mild motion degradation. IMPRESSION: 1. Small acute infarct in the left corona radiata. 2. Tiny acute or subacute infarct in the right putamen. 3. Background of moderate chronic small vessel ischemia. 4. Negative intracranial MRA with mild motion artifact. Electronically Signed   By: Marnee Spring M.D.   On: 09/06/2018 07:14   Patient seen and examined.  Clinical course and management discussed.  Necessary edits performed.  I agree with the above.  Assessment and plan of care developed and discussed below.    Assessment: 68 y.o. female with past medical history of COPD on home oxygen, hyperlipidemia, hypertension, diabetes, anxiety, osteoporosis, urinary incontinence and asthma presenting to the ED with syncopal episode and right leg weakness. 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 Etiology concerning for embolic etiology.  MRA head showed no large vessel occlusion, stenosis or aneurysm.   Ultrasound carotids bilateral shows no significant hemodynamically stenosis.  Patient states that she was not on any anticoagulation or antiplatelet prior to this episode.  Stroke Risk Factors - diabetes mellitus, family history, hyperlipidemia and hypertension  Plan: 1. HgbA1c, fasting lipid panel 2. PT consult, OT consult, Speech consult 3. Echocardiogram pending.  Would consider TEE if TTE unremarkable or suboptimal. 4. Prophylactic therapy-Antiplatelet med: Aspirin - dose 81 mg/day 5. NPO until RN stroke swallow screen 6. Telemetry monitoring 7. Frequent neuro checks 8. If above work up unremarkable would consider outpatient evaluation for prolonged cardiac monitoring.  This patient was staffed with Dr. Verlon Au, Thad Ranger who personally evaluated patient, reviewed documentation and agreed with assessment and plan of care as above.  Webb Silversmith, DNP, FNP-BC Board certified Nurse Practitioner Neurology Department  09/06/2018, 11:51 AM  Thana Farr, MD Neurology [phone]  09/06/2018  7:25 PM

## 2018-09-06 NOTE — Evaluation (Signed)
Occupational Therapy Evaluation Patient Details Name: Audrey Sexton MRN: [id number] DOB: 1950/11/12 Today's Date: 09/06/2018    History of Present Illness Audrey Sexton  is a 68 y.o. female who presents with chief complaint RLE weakness.  Patient states that she went to use her bathroom and woke up on the floor.  She does not recall what happened or how long she was unconscious.  When she woke up she had weakness in her right lower extremity, this is new for her. Storke work up completed in ED.  PMH also includes asthma (3L o2 in the home), COPD, HTN, and acute respiratory failure.    Clinical Impression   Pt seen for OT evaluation this date. Pt admitted to ED for R sided weakness and stroke work up after LOC in the home. 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 Prior to hospital admission, pt was independent in all ADL and mobility. Pt received support from a caregiver for IADL activities including shopping and cleaning 2/2 chronic respiratory conditions noted above. Pt lives alone in a 1 story apartment with 2 steps to enter with L hand rail. Currently pt reporting symptoms of new R sided weakness. Pt would benefit from skilled OT to address noted impairments and functional limitations (see below for any additional details) in order to maximize safety and independence while minimizing falls risk and caregiver burden.  Upon hospital discharge, recommend pt discharge to SNF.    Follow Up Recommendations  SNF    Equipment Recommendations  (TBD)    Recommendations for Other Services       Precautions / Restrictions Precautions Precautions: Fall Restrictions Weight Bearing Restrictions: No      Mobility Bed Mobility Overal bed mobility: Needs Assistance             General bed mobility comments: Pt in ED at time of evaluation, mobility status not formally assessed. Pt reports having difficulty using her right LE/UE during functional mobility tasks.    Transfers                      Balance                                           ADL either performed or assessed with clinical judgement   ADL Overall ADL's : Needs assistance/impaired                                       General ADL Comments: Pt with new generalized R sided weakness impacting functional independence in ADL/IADL.      Vision Baseline Vision/History: Wears glasses Wears Glasses: At all times Patient Visual Report: No change from baseline Vision Assessment?: Vision impaired- to be further tested in functional context Additional Comments: Pt inconsistently able to detect visual stimuli on R peripheral visual field.      Perception     Praxis      Pertinent Vitals/Pain Pain Assessment: No/denies pain     Hand Dominance Right   Extremity/Trunk Assessment Upper Extremity Assessment Upper Extremity Assessment: RUE deficits/detail RUE Deficits / Details: Noted decrease in strength/sensation/coordination in RUE grossly 3/5. RUE Sensation: decreased light touch RUE Coordination: decreased fine motor;decreased gross motor   Lower Extremity Assessment Lower Extremity Assessment: RLE deficits/detail;Defer  to PT evaluation RLE Deficits / Details: Pt unable to complete AROM of RLE. Limited DF/PF ROM noted. Defer to PT evaluation. RLE Sensation: decreased light touch RLE Coordination: decreased gross motor       Communication Communication Communication: No difficulties   Cognition Arousal/Alertness: Awake/alert Behavior During Therapy: WFL for tasks assessed/performed Overall Cognitive Status: Within Functional Limits for tasks assessed                                     General Comments  Pt with noted difference in R UE during finger to nose, opposition and RAM. Significant R pronator drift observed. Pt stated that she did not intially notice weakness in RUE, however since she has been at the  hospital she has had difficulty using RUE for bed mobility, self care, etc. RN reports pt still able to complete self-feeding.     Exercises Other Exercises Other Exercises: Instructed pt on placement and positioning of R UE in order to decrease risk of injury or skin breakdown with hemiparesis.   Shoulder Instructions      Home Living Family/patient expects to be discharged to:: Private residence Living Arrangements: Alone Available Help at Discharge: Available PRN/intermittently;Family(Daughter lives ~1hour away.) Type of Home: Apartment Home Access: Stairs to enter [employer] of Steps: 2 small steps Entrance Stairs-Rails: Left Home Layout: One level     Bathroom Shower/Tub: Chief [employer] Officer: Handicapped height     Home Equipment: Shower seat;Grab bars - tub/shower;Walker - standard(Pt endorses having SW, but does not use. )          Prior Functioning/Environment Level of Independence: Needs assistance  Gait / Transfers Assistance Needed: No Ad to ambulate, limited community mobility. ADL's / Homemaking Assistance Needed: Independent with ADL, daughter drives, brings groceries, assists with finanical mgmt, etc. Pt enjoys spending time with grandchildren.   Comments: Pt on 3 liters of 02 at hime. 0 falls reported in pat 12 months other than fall that precipitated this admission.        OT Problem List: Decreased strength;Decreased range of motion;Decreased coordination;Decreased safety awareness;Decreased knowledge of use of DME or AE;Impaired sensation;Impaired tone      OT Treatment/Interventions: Self-care/ADL training;Therapeutic exercise;Therapeutic activities;Neuromuscular education;Energy conservation;DME and/or AE instruction;Patient/family education;Visual/perceptual remediation/compensation    OT Goals(Current goals can be found in the care plan section) Acute Rehab OT Goals Patient Stated Goal: To improve strength in R side and go  home. OT Goal Formulation: With patient Time For Goal Achievement: 09/20/18 Potential to Achieve Goals: Good ADL Goals Pt Will Perform Eating: with modified independence;with adaptive utensils Pt Will Perform Grooming: with modified independence;with adaptive equipment Pt Will Perform Upper Body Dressing: with modified independence;with adaptive equipment;sitting(Using hemi strategies as appropriate.) Pt Will Perform Lower Body Dressing: with modified independence;with adaptive equipment(With LRAD and hemi strategies as appropriate for safety.) Pt Will Perform Tub/Shower Transfer: with min assist;Stand pivot transfer(With LRAD/DME as needed for safety.) Additional ADL Goal #1: Pt will verbalize plan for implementing 2 falls prevention strategies to improve safety and independence within the home.  OT Frequency: Min 2X/week   Barriers to D/C: Decreased caregiver support;Inaccessible home environment  Pt with limited caregiver support. Daughter assists, but lives ~1 hour away and cares for her own children in addition to her mother.        Co-evaluation  AM-PAC OT "6 Clicks" Daily Activity     Outcome Measure Help from another person eating meals?: A Little Help from another person taking care of personal grooming?: A Little Help from another person toileting, which includes using toliet, bedpan, or urinal?: A Lot Help from another person bathing (including washing, rinsing, drying)?: A Lot Help from another person to put on and taking off regular upper body clothing?: A Little Help from another person to put on and taking off regular lower body clothing?: A Lot 6 Click Score: 15   End of Session    Activity Tolerance: Patient tolerated treatment well;No increased pain Patient left: in bed;with call bell/phone within reach  OT Visit Diagnosis: Unsteadiness on feet (R26.81);Other abnormalities of gait and mobility (R26.89);History of falling (Z91.81);Muscle weakness  (generalized) (M62.81);Hemiplegia and hemiparesis Hemiplegia - Right/Left: Right Hemiplegia - dominant/non-dominant: Dominant Hemiplegia - caused by: Cerebral infarction                Time: 1330-1402 OT Time Calculation (min): 32 min Charges:  OT General Charges $OT Visit: 1 Visit OT Evaluation $OT Eval Moderate Complexity: 1 Mod OT Treatments $Self Care/Home Management : 8-22 mins  Eller Sweis, OTR/L 09/06/18, 3:23 PM

## 2018-09-06 NOTE — Evaluation (Signed)
Physical Therapy Evaluation Patient Details Name: Audrey Sexton MRN: [id number] DOB: [birth year] Today's Date: 09/06/2018   History of Present Illness  Jabreia Delacruz  is a 68 y.o. female who presents with chief complaint RLE weakness.  Patient states that she went to use her bathroom and woke up on the floor.  She does not recall what happened or how long she was unconscious.  When she woke up she had weakness in her right lower extremity, this is new for her. Storke work up completed in ED; MRI showed Small acute infarct in the left corona radiata, Tiny acute or subacute infarct in the right putamen. PMH also includes asthma (3L o2 in the home), COPD, HTN, and acute respiratory failure.     Clinical Impression  Patient alert, oriented to self and situation, sitting EOB. Pt reported prior to admission, independent with gait and ADLs, lives alone in one level house.   Pt on 3L of oxygen throughout session vitals WFLs. Upon assessment pt demonstrated RUE and RLE weakness, LE>UE, as well as decreased coordination. Pt able to perform sit to supine with minAx1 for LE management, able to reposition in bed/rolling with setup and CGA. Sit<> stand with modAx1 and RW, exhibited difficulty with TKE on R, hip extension, decreased weight bearing on RLE and quickly fatigued, unable to stand with support >10seconds. Stand pivot transfer attempted maxAx2 and RW, unable to initiate. The patient would benefit from further assessment of mobility as well as proper AD to optimize mobility/independence, as well as to address deficits (see "PT Problem List"). Recommendation is STR due to current mobility status and safety concerns.       Follow Up Recommendations SNF    Equipment Recommendations  Other (comment)(TBD at next venue of care)    Recommendations for Other Services OT consult     Precautions / Restrictions Precautions Precautions: Fall Restrictions Weight Bearing Restrictions: No      Mobility   Bed Mobility Overal bed mobility: Needs Assistance Bed Mobility: Rolling;Supine to Sit;Sit to Supine Rolling: Min guard   Supine to sit: Min guard Sit to supine: Min assist   General bed mobility comments: minAx1 for LE management to return to supine  Transfers Overall transfer level: Needs assistance Equipment used: Rolling walker (2 wheeled) Transfers: Sit to/from [employer] Sit to Stand: Mod assist;Min assist;+2 physical assistance;From elevated surface Stand pivot transfers: Max assist;+2 physical assistance       General transfer comment: Able to perform sit to stand with modAx1, difficulty with hip extension, RLE weight bearing, TKE on R knee. minAx2. MaxAx2 to attempt stand pivot transfer, pt unwilling/unable to assist with stand pivot to bedside commode.    Ambulation/Gait             General Gait Details: unable to intiate  Stairs            Wheelchair Mobility    Modified Rankin (Stroke Patients Only) Modified Rankin (Stroke Patients Only) Pre-Morbid Rankin Score: No symptoms Modified Rankin: Severe disability     Balance Overall balance assessment: Needs assistance Sitting-balance support: Feet unsupported Sitting balance-Leahy Scale: Fair       Standing balance-Leahy Scale: Poor                               Pertinent Vitals/Pain Pain Assessment: No/denies pain    Home Living Family/patient expects to be discharged to:: Private residence Living Arrangements: Alone Available Help at  Discharge: Available PRN/intermittently;Family(daughter lives 1hr away) Type of Home: Apartment Home Access: Stairs to enter Entrance Stairs-Rails: Left Entrance Stairs-Number of Steps: 2 small steps Home Layout: One level Home Equipment: Shower seat;Grab bars - tub/shower;Walker - standard Additional Comments: PLOF obtained from chart (Occupational therapy)    Prior Function Level of Independence: Needs assistance   Gait /  Transfers Assistance Needed: No Ad to ambulate, limited community mobility.  ADL's / Homemaking Assistance Needed: Independent with ADL, daughter drives, brings groceries, assists with finanical mgmt, etc. Pt enjoys spending time with grandchildren.  Comments: Pt on 3 liters of 02 at hime. 0 falls reported in pat 12 months except for fall that led to this admission.     Hand Dominance   Dominant Hand: Right    Extremity/Trunk Assessment   Upper Extremity Assessment Upper Extremity Assessment: RUE deficits/detail;LUE deficits/detail RUE Deficits / Details: 3-/5 (shoulder flexion) 3+/5 elbow flexion, 3/5 elbow extension, grip strength 3+5 RUE Sensation: decreased light touch RUE Coordination: decreased fine motor;decreased gross motor LUE Deficits / Details: grossly 4/5 LUE Sensation: WNL LUE Coordination: WNL    Lower Extremity Assessment Lower Extremity Assessment: RLE deficits/detail;LLE deficits/detail RLE Deficits / Details: AAROM for all RLE motions. grossly 2+/5, though  good quad contraction noted with quad set. RLE Sensation: decreased light touch RLE Coordination: decreased gross motor LLE Deficits / Details: grossly 4/5       Communication   Communication: No difficulties  Cognition Arousal/Alertness: Awake/alert Behavior During Therapy: WFL for tasks assessed/performed Overall Cognitive Status: Within Functional Limits for tasks assessed                                        General Comments General comments (skin integrity, edema, etc.): Pt with RLE cool to touch, RN notified. Some complaints of pain with R ankle and R knee movement, improvement with repetition.     Exercises Other Exercises Other Exercises: Instructed to perform grip squeezes, finger opposition and quad set at least once an hour.    Assessment/Plan    PT Assessment Patient needs continued PT services  PT Problem List Decreased strength;Decreased coordination;Decreased  range of motion;Impaired sensation;Decreased activity tolerance;Decreased knowledge of use of DME;Decreased balance;Impaired tone;Decreased safety awareness;Decreased mobility;Decreased knowledge of precautions       PT Treatment Interventions DME instruction;Therapeutic exercise;Gait training;Balance training;Stair training;Neuromuscular re-education;Functional mobility training;Therapeutic activities;Patient/family education    PT Goals (Current goals can be found in the Care Plan section)  Acute Rehab PT Goals Patient Stated Goal: to return to PLOF PT Goal Formulation: With patient Time For Goal Achievement: 09/20/18    Frequency 7X/week   Barriers to discharge Decreased caregiver support;Inaccessible home environment      Co-evaluation               AM-PAC PT "6 Clicks" Mobility  Outcome Measure Help needed turning from your back to your side while in a flat bed without using bedrails?: A Little Help needed moving from lying on your back to sitting on the side of a flat bed without using bedrails?: A Little Help needed moving to and from a bed to a chair (including a wheelchair)?: A Lot Help needed standing up from a chair using your arms (e.g., wheelchair or bedside chair)?: A Lot Help needed to walk in hospital room?: Total Help needed climbing 3-5 steps with a railing? : Total 6 Click Score: 12  End of Session Equipment Utilized During Treatment: Gait belt;Oxygen;Other (comment)(3L) Activity Tolerance: Patient limited by fatigue Patient left: with nursing/sitter in room;with call bell/phone within reach Nurse Communication: Mobility status;Other (comment)(RLE cool to touch) PT Visit Diagnosis: Unsteadiness on feet (R26.81);Difficulty in walking, not elsewhere classified (R26.2);Hemiplegia and hemiparesis;Muscle weakness (generalized) (M62.81) Hemiplegia - Right/Left: Right Hemiplegia - dominant/non-dominant: Dominant Hemiplegia - caused by: Cerebral infarction     Time: 1452-1519 PT Time Calculation (min) (ACUTE ONLY): 27 min   Charges:   PT Evaluation $PT Eval Moderate Complexity: 1 Mod PT Treatments $Therapeutic Activity: 8-22 mins        Olga Coasteriana Malayzia Laforte PT, DPT 3:56 PM,09/06/18 [phone]

## 2018-09-06 NOTE — ED Notes (Signed)
Patient transported to Ultrasound 

## 2018-09-06 NOTE — Care Management Note (Signed)
Case Management Note  Patient Details  Name: Audrey Sexton MRN: [id number] Date of Birth: April 30, 1951  Subjective/Objective:     Patient admitted to [facility] under observation status for right leg weakness. RNCM consulted on patient to provide MOON letter and complete assessment. Patient experienced sudden weakness and fall at home. Patient is typically independent with activities of daily living and lives alone. She has a daughter who is able to assist with transport but also has transportation set up through [employer]. Patient on chronic O2 through [employer] but uses no other DME. 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 Uses [facility] and obtains medications without issue.  Audrey Reeve Jlynn Ly RN BSN RNCM [phone]                 Action/Plan:  RNCM to continue to follow for any needs.  Expected Discharge Date:                  Expected Discharge Plan:     In-House Referral:     Discharge planning Services     Post Acute Care Choice:    Choice offered to:     DME Arranged:    DME Agency:     HH Arranged:    HH Agency:     Status of Service:     If discussed at [employer], dates discussed:    Additional Comments:  Audrey Dalsanto [name], RN 09/06/2018, 1:10 PM

## 2018-09-06 NOTE — ED Notes (Signed)
Pt was wearing depend.  When PT got pt to side of bed I noted that her depend was saturated.  Changed pt out of depend and changed pad under her.  Pt was incontinent of both urine and stool.

## 2018-09-06 NOTE — ED Notes (Signed)
Pt is back from medical imaging. 

## 2018-09-06 NOTE — ED Notes (Signed)
Occupational Therapy is with pt.  Echo came and will return

## 2018-09-06 NOTE — Progress Notes (Signed)
[facility] [facility] [place] [facility]   PATIENT NAME: Audrey Sexton    MR#:  [id number]  DATE OF BIRTH:  [birth year]  SUBJECTIVE:  CHIEF COMPLAINT:   Chief Complaint  Patient presents with  . Fall  . Loss of Consciousness   Came with a fall, have RLL weakness- found to have a new stroke. REVIEW OF SYSTEMS:  CONSTITUTIONAL: No fever, fatigue or weakness.  EYES: No blurred or double vision.  EARS, NOSE, AND THROAT: No tinnitus or ear pain.  RESPIRATORY: No cough, shortness of breath, wheezing or hemoptysis.  CARDIOVASCULAR: No chest pain, orthopnea, edema.  GASTROINTESTINAL: No nausea, vomiting, diarrhea or abdominal pain.  GENITOURINARY: No dysuria, hematuria.  ENDOCRINE: No polyuria, nocturia,  HEMATOLOGY: No anemia, easy bruising or bleeding SKIN: No rash or lesion. MUSCULOSKELETAL: No joint pain or arthritis.   NEUROLOGIC: No tingling, numbness, RLL weakness.  PSYCHIATRY: No anxiety or depression.   ROS  DRUG ALLERGIES:   Allergies  Allergen Reactions  . Percocet [Oxycodone-Acetaminophen] Nausea And Vomiting  . Vicodin [Hydrocodone-Acetaminophen] Nausea And Vomiting  . Penicillins Hives    Has patient had a PCN reaction causing immediate rash, facial/tongue/throat swelling, SOB or lightheadedness with hypotension: no Has patient had a PCN reaction causing severe rash involving mucus membranes or skin necrosis: no Has patient had a PCN reaction that required hospitalization  no Has patient had a PCN reaction occurring within the last 10 years: no If all of the above answers are "NO", then may proceed with Cephalosporin use.     VITALS:  Blood pressure (!) 166/57, pulse 94, temperature 98.2 F (36.8 C), temperature source Oral, resp. rate 16, height 5\' 4"  (1.626 m), weight 90.4 kg, SpO2 98 %.  PHYSICAL EXAMINATION:  GENERAL:  68 y.o.-year-old patient lying in the bed with no acute distress.  EYES: Pupils equal, round, reactive to light and  accommodation. No scleral icterus. Extraocular muscles intact.  HEENT: Head atraumatic, normocephalic. Oropharynx and nasopharynx clear.  NECK:  Supple, no jugular venous distention. No thyroid enlargement, no tenderness.  LUNGS: Normal breath sounds bilaterally, no wheezing, rales,rhonchi or crepitation. No use of accessory muscles of respiration.  CARDIOVASCULAR: S1, S2 normal. No murmurs, rubs, or gallops.  ABDOMEN: Soft, nontender, nondistended. Bowel sounds present. No organomegaly or mass.  EXTREMITIES: No pedal edema, cyanosis, or clubbing.  NEUROLOGIC: Cranial nerves II through XII are intact. Muscle strength 5/5 in all extremities except 0/5 in RLL. Sensation intact. Gait not checked.  PSYCHIATRIC: The patient is alert and oriented x 3.  SKIN: No obvious rash, lesion, or ulcer.   Physical Exam LABORATORY PANEL:   CBC Recent Labs  Lab 09/06/18 0529  WBC 10.2  HGB 13.7  HCT 43.7  PLT 228   ------------------------------------------------------------------------------------------------------------------  Chemistries  Recent Labs  Lab 09/05/18 1623 09/06/18 0529  NA 142  --   K 4.0  --   CL 102  --   CO2 32  --   GLUCOSE 152*  --   BUN 15  --   CREATININE 1.16* 1.09*  CALCIUM 9.5  --    ------------------------------------------------------------------------------------------------------------------  Cardiac Enzymes Recent Labs  Lab 09/05/18 1948  TROPONINI <0.03   ------------------------------------------------------------------------------------------------------------------  RADIOLOGY:  Dg Chest 1 View  Result Date: 09/05/2018 CLINICAL DATA:  Weakness and pain after a fall. EXAM: CHEST  1 VIEW COMPARISON:  08/02/2018 FINDINGS: Shallow inspiration with linear atelectasis in the lung bases. Heart size and pulmonary vascularity are normal. 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 No airspace  disease or consolidation. No blunting of costophrenic angles. No  pneumothorax. Calcified and tortuous aorta. Degenerative changes in the spine and shoulders. Similar appearance to previous study. IMPRESSION: Shallow inspiration with linear atelectasis in the lung bases. Chronic fibrosis in the bases. No active pulmonary disease identified. Electronically Signed   By: Burman NievesWilliam  Stevens M.D.   On: 09/05/2018 23:10   Dg Pelvis 1-2 Views  Result Date: 09/05/2018 CLINICAL DATA:  Weakness and pain after a fall. EXAM: PELVIS - 1-2 VIEW COMPARISON:  Left hip 12/10/2007 FINDINGS: There is no evidence of pelvic fracture or diastasis. No pelvic bone lesions are seen. Mild degenerative changes in the hips and lower lumbar spine. IMPRESSION: No acute bony abnormalities.  Mild degenerative changes. Electronically Signed   By: Burman NievesWilliam  Stevens M.D.   On: 09/05/2018 23:10   Ct Head Wo Contrast  Result Date: 09/05/2018 CLINICAL DATA:  Patient fell earlier joint is a bathroom. Right leg weakness. EXAM: CT HEAD WITHOUT CONTRAST TECHNIQUE: Contiguous axial images were obtained from the base of the skull through the vertex without intravenous contrast. COMPARISON:  12/08/2015 FINDINGS: Brain: Chronic moderate small vessel ischemic disease of periventricular, deep and subcortical white matter more so along the frontoparietal regions. No acute large vascular territory appearing infarct, midline shift or edema. No acute intracranial hemorrhage or extra-axial fluid collections. No intra-axial mass. No hydrocephalus. Midline fourth ventricle and basal cisterns without effacement. Vascular: Atherosclerosis of the vertebral arteries. No hyperdense vessel sign. Skull: Intact without fracture. Sinuses/Orbits: Unremarkable. Other: None IMPRESSION: Chronic moderate small vessel ischemic disease of periventricular, deep and subcortical white matter. No acute intracranial abnormality by CT noted. Electronically Signed   By: Tollie Ethavid  Kwon M.D.   On: 09/05/2018 22:56   Mr Brain Wo Contrast  Result Date:  09/06/2018 CLINICAL DATA:  Focal neuro deficit with stroke suspected. Syncope with right lower extremity weakness EXAM: MRI HEAD WITHOUT CONTRAST MRA HEAD WITHOUT CONTRAST TECHNIQUE: Multiplanar, multiecho pulse sequences of the brain and surrounding structures were obtained without intravenous contrast. Angiographic images of the head were obtained using MRA technique without contrast. 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 Tiny weakly restricted diffusion focus in the upper right putamen on coronal acquisition. Background of moderate chronic small vessel ischemic change in the cerebral white matter. No hemorrhage, hydrocephalus, or masslike finding. Vascular: Arterial findings below. Normal dural venous sinus flow voids. Skull and upper cervical spine: No focal marrow lesion. Left-sided facet spurring. 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 Sinuses/Orbits: Partial right mastoid opacification. Negative nasopharynx. Other: Motion degraded study requiring fast protocol technique. MRA HEAD FINDINGS Carotid and vertebral arteries are smooth and diffusely patent. No branch occlusion, beading, or aneurysm. Mild motion degradation. IMPRESSION: 1. Small acute infarct in the left corona radiata. 2. Tiny acute or subacute infarct in the right putamen. 3. Background of moderate chronic small vessel ischemia. 4. Negative intracranial MRA with mild motion artifact. Electronically Signed   By: Marnee SpringJonathon  Watts M.D.   On: 09/06/2018 07:14   [place] Carotid Bilateral (at [facility] And [facility] Only)  Result Date: 09/06/2018 CLINICAL DATA:  Right leg weakness for 1 day EXAM: BILATERAL CAROTID DUPLEX ULTRASOUND TECHNIQUE: Wallace CullensGray scale imaging, color Doppler and duplex ultrasound were performed of bilateral carotid and vertebral arteries in the neck. COMPARISON:  None. FINDINGS: Criteria: Quantification of carotid stenosis  is based on velocity parameters that correlate the residual internal carotid diameter  with NASCET-based stenosis levels, using the diameter of the distal internal carotid lumen as the denominator for stenosis measurement. The following velocity measurements were obtained: RIGHT ICA: 63 cm/sec CCA: 61 cm/sec SYSTOLIC ICA/CCA RATIO:  1.0 ECA: 85 cm/sec LEFT ICA: 85 cm/sec CCA: 53 cm/sec SYSTOLIC ICA/CCA RATIO:  1.6 ECA: 64 cm/sec RIGHT CAROTID ARTERY: Mild smooth calcified plaque in the bulb. Low resistance internal carotid Doppler pattern. RIGHT VERTEBRAL ARTERY:  Antegrade. LEFT CAROTID ARTERY: Mild calcified plaque along the wall of the bulb. Low resistance internal carotid Doppler pattern is preserved. LEFT VERTEBRAL ARTERY:  Antegrade. IMPRESSION: Less than 50% stenosis in the right and left internal carotid arteries. Electronically Signed   By: Jolaine Click M.D.   On: 09/06/2018 09:00   Mr Maxine Glenn Head/brain XI Cm  Result Date: 09/06/2018 CLINICAL DATA:  Focal neuro deficit with stroke suspected. Syncope with right lower extremity weakness EXAM: MRI HEAD WITHOUT CONTRAST MRA HEAD WITHOUT CONTRAST TECHNIQUE: Multiplanar, multiecho pulse sequences of the brain and surrounding structures were obtained without intravenous contrast. Angiographic images of the head were obtained using MRA technique without contrast. 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 Tiny weakly restricted diffusion focus in the upper right putamen on coronal acquisition. Background of moderate chronic small vessel ischemic change in the cerebral white matter. No hemorrhage, hydrocephalus, or masslike finding. Vascular: Arterial findings below. Normal dural venous sinus flow voids. Skull and upper cervical spine: No focal marrow lesion. Left-sided facet spurring. 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 Sinuses/Orbits:  Partial right mastoid opacification. Negative nasopharynx. Other: Motion degraded study requiring fast protocol technique. MRA HEAD FINDINGS Carotid and vertebral arteries are smooth and diffusely patent. No branch occlusion, beading, or aneurysm. Mild motion degradation. IMPRESSION: 1. Small acute infarct in the left corona radiata. 2. Tiny acute or subacute infarct in the right putamen. 3. Background of moderate chronic small vessel ischemia. 4. Negative intracranial MRA with mild motion artifact. Electronically Signed   By: Marnee Spring M.D.   On: 09/06/2018 07:14    ASSESSMENT AND PLAN:   Principal Problem:   Right leg weakness Active Problems:   COPD (chronic obstructive pulmonary disease) (HCC)   HTN (hypertension)   HLD (hyperlipidemia)   * CVA- Right leg weakness       MRI confirms acute stroke    Carotid doppler- no blockages    Echo awaited. May need TEE per Neuro   LDL is < 70   PT and OT eval   SLP eval.  *  COPD (chronic obstructive pulmonary disease) (HCC) -continue home meds  *  HTN (hypertension) -permissive hypertension tonight, blood pressure goal less than 220/120  *  HLD (hyperlipidemia) -Home dose antilipid   All the records are reviewed and case discussed with Care Management/Social Workerr. Management plans discussed with the patient, family and they are in agreement.  CODE STATUS: Full.  TOTAL TIME TAKING CARE OF THIS PATIENT: 35 minutes.     POSSIBLE D/C IN 1-2 DAYS, DEPENDING ON CLINICAL CONDITION.   Altamese Dilling M.D on 09/06/2018   Between 7am to 6pm - Pager - [phone]  After 6pm go to [email or web address] - password [employer]  [facility] [place] [facility]  Office  [phone]  CC: Primary care physician; Oswaldo Conroy, MD  Note: This dictation was prepared with Dragon dictation along with smaller phrase technology. Any transcriptional errors that result from this  process are unintentional.

## 2018-09-07 ENCOUNTER — Inpatient Hospital Stay
Admit: 2018-09-07 | Discharge: 2018-09-07 | Disposition: A | Discharge status: Home / Self Care | Payer: Medicare Other | Attending: Neurology | Admitting: Neurology

## 2018-09-07 MED ORDER — AMLODIPINE BESYLATE 10 MG PO TABS
10.0000 mg | ORAL_TABLET | Freq: Every day | ORAL | Status: DC
  Administered 2018-09-07 – 2018-09-11 (×5): 10 mg via ORAL
  Filled 2018-09-07 (×5): qty 1

## 2018-09-07 NOTE — Progress Notes (Signed)
Occupational Therapy Treatment Patient Details Name: KRYSLYNN LUCKENBILL MRN: [id number] DOB: 07-20-1951 Today's Date: 09/07/2018    History of present illness Shardi Dauphinais  is a 68 y.o. female who presents with chief complaint RLE weakness.  Patient states that she went to use her bathroom and woke up on the floor.  She does not recall what happened or how long she was unconscious.  When she woke up she had weakness in her right lower extremity, this is new for her. Storke work up completed in ED; MRI showed Small acute infarct in the left corona radiata, Tiny acute or subacute infarct in the right putamen. PMH also includes asthma (3L o2 in the home), COPD, HTN, and acute respiratory failure.    OT comments  Pt seen for RUE and hand neuromuscular re-ed exercises with trigger point release to R biceps to help decrease pain and reviewed proper positioning and how to support RUE when moving it to prevent further pain.  Also reivewed fine motor exercises sheet and O2 safety at home with handouts left with patient.  Gave her 2 red foam utensil holders to help increase independence with self feeding and CNA updated.  Pt lives at home alone and making good progress and continue to rec SNF after DC.  Follow Up Recommendations  SNF    Equipment Recommendations       Recommendations for Other Services      Precautions / Restrictions Precautions Precautions: Fall Restrictions Weight Bearing Restrictions: No       Mobility Bed Mobility                  Transfers                      Balance                                           ADL either performed or assessed with clinical judgement   ADL Overall ADL's : Needs assistance/impaired                                       General ADL Comments: Pt seen for RUE and hand neuromuscular re-ed exercises with trigger point release to R biceps to help decrease pain and reviewed proper  positioning and how to support RUE when moving it to prevent further pain.  Also reivewed fine motor exercises sheet and O2 safety at home with handouts left with patient.  Gave her 2 red foam utensil holders to help increase independence with self feeding and CNA updated.       Vision Patient Visual Report: No change from baseline     Perception     Praxis      Cognition Arousal/Alertness: Awake/alert Behavior During Therapy: WFL for tasks assessed/performed Overall Cognitive Status: Within Functional Limits for tasks assessed                                          Exercises     Shoulder Instructions       General Comments      Pertinent Vitals/ Pain       Pain Assessment: No/denies pain  Home  Living                                          Prior Functioning/Environment              Frequency  Min 2X/week        Progress Toward Goals  OT Goals(current goals can now be found in the care plan section)  Progress towards OT goals: Progressing toward goals  Acute Rehab OT Goals Patient Stated Goal: to return to PLOF OT Goal Formulation: With patient Time For Goal Achievement: 09/20/18 Potential to Achieve Goals: Good  Plan Discharge plan remains appropriate    Co-evaluation                 AM-PAC OT "6 Clicks" Daily Activity     Outcome Measure   Help from another person eating meals?: A Little Help from another person taking care of personal grooming?: A Little Help from another person toileting, which includes using toliet, bedpan, or urinal?: A Lot Help from another person bathing (including washing, rinsing, drying)?: A Lot Help from another person to put on and taking off regular upper body clothing?: A Little Help from another person to put on and taking off regular lower body clothing?: A Lot 6 Click Score: 15    End of Session    OT Visit Diagnosis: Unsteadiness on feet (R26.81);Other  abnormalities of gait and mobility (R26.89);History of falling (Z91.81);Muscle weakness (generalized) (M62.81);Hemiplegia and hemiparesis Hemiplegia - Right/Left: Right Hemiplegia - dominant/non-dominant: Dominant Hemiplegia - caused by: Cerebral infarction   Activity Tolerance Patient tolerated treatment well;No increased pain   Patient Left in bed;with call bell/phone within reach   Nurse Communication Other (comment)(red foam utensil holders given for self feeding and grooming and CNA Angela updated)        Time: 1020-1050 OT Time Calculation (min): 30 min  Charges: OT General Charges $OT Visit: 1 Visit OT Treatments $Self Care/Home Management : 8-22 mins $Neuromuscular Re-education: 8-22 mins  Susanne Borders, OTR/L ascom [phone] 09/07/18, 11:05 AM

## 2018-09-07 NOTE — Progress Notes (Signed)
[facility] Audrey Sexton   PATIENT NAME: Audrey Sexton    MR#:  [id number]  DATE OF BIRTH:  Sep 29, 1950  SUBJECTIVE:  CHIEF COMPLAINT:   Chief Complaint  Patient presents with  . Fall  . Loss of Consciousness   Came with a fall, have RLL weakness- found to have a new stroke. REVIEW OF SYSTEMS:  CONSTITUTIONAL: No fever, fatigue or weakness.  EYES: No blurred or double vision.  EARS, NOSE, AND THROAT: No tinnitus or ear pain.  RESPIRATORY: No cough, shortness of breath, wheezing or hemoptysis.  CARDIOVASCULAR: No chest pain, orthopnea, edema.  GASTROINTESTINAL: No nausea, vomiting, diarrhea or abdominal pain.  GENITOURINARY: No dysuria, hematuria.  ENDOCRINE: No polyuria, nocturia,  HEMATOLOGY: No anemia, easy bruising or bleeding SKIN: No rash or lesion. MUSCULOSKELETAL: No joint pain or arthritis.   NEUROLOGIC: No tingling, numbness, RLL weakness.  PSYCHIATRY: No anxiety or depression.   ROS  DRUG ALLERGIES:   Allergies  Allergen Reactions  . Percocet [Oxycodone-Acetaminophen] Nausea And Vomiting  . Vicodin [Hydrocodone-Acetaminophen] Nausea And Vomiting  . Penicillins Hives    Has patient had a PCN reaction causing immediate rash, facial/tongue/throat swelling, SOB or lightheadedness with hypotension: no Has patient had a PCN reaction causing severe rash involving mucus membranes or skin necrosis: no Has patient had a PCN reaction that required hospitalization  no Has patient had a PCN reaction occurring within the last 10 years: no If all of the above answers are "NO", then may proceed with Cephalosporin use.     VITALS:  Blood pressure (!) 169/81, pulse 100, temperature 98.5 F (36.9 C), temperature source Oral, resp. rate 18, height 5\' 4"  (1.626 m), weight 90.4 kg, SpO2 96 %.  PHYSICAL EXAMINATION:  GENERAL:  68 y.o.-year-old patient lying in the bed with no acute distress.  EYES: Pupils equal, round, reactive to light and  accommodation. No scleral icterus. Extraocular muscles intact.  HEENT: Head atraumatic, normocephalic. Oropharynx and nasopharynx clear.  NECK:  Supple, no jugular venous distention. No thyroid enlargement, no tenderness.  LUNGS: Normal breath sounds bilaterally, no wheezing, rales,rhonchi or crepitation. No use of accessory muscles of respiration.  CARDIOVASCULAR: S1, S2 normal. No murmurs, rubs, or gallops.  ABDOMEN: Soft, nontender, nondistended. Bowel sounds present. No organomegaly or mass.  EXTREMITIES: No pedal edema, cyanosis, or clubbing.  NEUROLOGIC: Cranial nerves II through XII are intact. Muscle strength 5/5 in all extremities except 0/5 in RLL-today able to move only the great toe on the right side. Sensation intact. Gait not checked.  PSYCHIATRIC: The patient is alert and oriented x 3.  SKIN: No obvious rash, lesion, or ulcer.   Physical Exam LABORATORY PANEL:   CBC Recent Labs  Lab 09/06/18 0529  WBC 10.2  HGB 13.7  HCT 43.7  PLT 228   ------------------------------------------------------------------------------------------------------------------  Chemistries  Recent Labs  Lab 09/05/18 1623 09/06/18 0529  NA 142  --   K 4.0  --   CL 102  --   CO2 32  --   GLUCOSE 152*  --   BUN 15  --   CREATININE 1.16* 1.09*  CALCIUM 9.5  --    ------------------------------------------------------------------------------------------------------------------  Cardiac Enzymes Recent Labs  Lab 09/05/18 1948  TROPONINI <0.03   ------------------------------------------------------------------------------------------------------------------  RADIOLOGY:  Dg Chest 1 View  Result Date: 09/05/2018 CLINICAL DATA:  Weakness and pain after a fall. EXAM: CHEST  1 VIEW COMPARISON:  08/02/2018 FINDINGS: Shallow inspiration with linear atelectasis in the lung bases. Heart size and pulmonary  vascularity are normal. 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 No airspace disease or  consolidation. No blunting of costophrenic angles. No pneumothorax. Calcified and tortuous aorta. Degenerative changes in the spine and shoulders. Similar appearance to previous study. IMPRESSION: Shallow inspiration with linear atelectasis in the lung bases. Chronic fibrosis in the bases. No active pulmonary disease identified. Electronically Signed   By: Burman NievesWilliam  Stevens M.D.   On: 09/05/2018 23:10   Dg Pelvis 1-2 Views  Result Date: 09/05/2018 CLINICAL DATA:  Weakness and pain after a fall. EXAM: PELVIS - 1-2 VIEW COMPARISON:  Left hip 12/10/2007 FINDINGS: There is no evidence of pelvic fracture or diastasis. No pelvic bone lesions are seen. Mild degenerative changes in the hips and lower lumbar spine. IMPRESSION: No acute bony abnormalities.  Mild degenerative changes. Electronically Signed   By: Burman NievesWilliam  Stevens M.D.   On: 09/05/2018 23:10   Ct Head Wo Contrast  Result Date: 09/05/2018 CLINICAL DATA:  Patient fell earlier joint is a bathroom. Right leg weakness. EXAM: CT HEAD WITHOUT CONTRAST TECHNIQUE: Contiguous axial images were obtained from the base of the skull through the vertex without intravenous contrast. COMPARISON:  12/08/2015 FINDINGS: Brain: Chronic moderate small vessel ischemic disease of periventricular, deep and subcortical white matter more so along the frontoparietal regions. No acute large vascular territory appearing infarct, midline shift or edema. No acute intracranial hemorrhage or extra-axial fluid collections. No intra-axial mass. No hydrocephalus. Midline fourth ventricle and basal cisterns without effacement. Vascular: Atherosclerosis of the vertebral arteries. No hyperdense vessel sign. Skull: Intact without fracture. Sinuses/Orbits: Unremarkable. Other: None IMPRESSION: Chronic moderate small vessel ischemic disease of periventricular, deep and subcortical white matter. No acute intracranial abnormality by CT noted. Electronically Signed   By: Tollie Ethavid  Kwon M.D.   On:  09/05/2018 22:56   Mr Brain Wo Contrast  Result Date: 09/06/2018 CLINICAL DATA:  Focal neuro deficit with stroke suspected. Syncope with right lower extremity weakness EXAM: MRI HEAD WITHOUT CONTRAST MRA HEAD WITHOUT CONTRAST TECHNIQUE: Multiplanar, multiecho pulse sequences of the brain and surrounding structures were obtained without intravenous contrast. Angiographic images of the head were obtained using MRA technique without contrast. 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 Tiny weakly restricted diffusion focus in the upper right putamen on coronal acquisition. Background of moderate chronic small vessel ischemic change in the cerebral white matter. No hemorrhage, hydrocephalus, or masslike finding. Vascular: Arterial findings below. Normal dural venous sinus flow voids. Skull and upper cervical spine: No focal marrow lesion. Left-sided facet spurring. 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 Sinuses/Orbits: Partial right mastoid opacification. Negative nasopharynx. Other: Motion degraded study requiring fast protocol technique. MRA HEAD FINDINGS Carotid and vertebral arteries are smooth and diffusely patent. No branch occlusion, beading, or aneurysm. Mild motion degradation. IMPRESSION: 1. Small acute infarct in the left corona radiata. 2. Tiny acute or subacute infarct in the right putamen. 3. Background of moderate chronic small vessel ischemia. 4. Negative intracranial MRA with mild motion artifact. Electronically Signed   By: Marnee SpringJonathon  Watts M.D.   On: 09/06/2018 07:14   [place] Carotid Bilateral (at [facility] And [facility] Only)  Result Date: 09/06/2018 CLINICAL DATA:  Right leg weakness for 1 day EXAM: BILATERAL CAROTID DUPLEX ULTRASOUND TECHNIQUE: Wallace CullensGray scale imaging, color Doppler and duplex ultrasound were performed of bilateral carotid and vertebral arteries in the neck. COMPARISON:   None. FINDINGS: Criteria: Quantification of carotid stenosis is  based on velocity parameters that correlate the residual internal carotid diameter with NASCET-based stenosis levels, using the diameter of the distal internal carotid lumen as the denominator for stenosis measurement. The following velocity measurements were obtained: RIGHT ICA: 63 cm/sec CCA: 61 cm/sec SYSTOLIC ICA/CCA RATIO:  1.0 ECA: 85 cm/sec LEFT ICA: 85 cm/sec CCA: 53 cm/sec SYSTOLIC ICA/CCA RATIO:  1.6 ECA: 64 cm/sec RIGHT CAROTID ARTERY: Mild smooth calcified plaque in the bulb. Low resistance internal carotid Doppler pattern. RIGHT VERTEBRAL ARTERY:  Antegrade. LEFT CAROTID ARTERY: Mild calcified plaque along the wall of the bulb. Low resistance internal carotid Doppler pattern is preserved. LEFT VERTEBRAL ARTERY:  Antegrade. IMPRESSION: Less than 50% stenosis in the right and left internal carotid arteries. Electronically Signed   By: Jolaine Click M.D.   On: 09/06/2018 09:00   Mr Maxine Glenn Head/brain PQ Cm  Result Date: 09/06/2018 CLINICAL DATA:  Focal neuro deficit with stroke suspected. Syncope with right lower extremity weakness EXAM: MRI HEAD WITHOUT CONTRAST MRA HEAD WITHOUT CONTRAST TECHNIQUE: Multiplanar, multiecho pulse sequences of the brain and surrounding structures were obtained without intravenous contrast. Angiographic images of the head were obtained using MRA technique without contrast. 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 Tiny weakly restricted diffusion focus in the upper right putamen on coronal acquisition. Background of moderate chronic small vessel ischemic change in the cerebral white matter. No hemorrhage, hydrocephalus, or masslike finding. Vascular: Arterial findings below. Normal dural venous sinus flow voids. Skull and upper cervical spine: No focal marrow lesion. Left-sided facet spurring. Eroded appearance of the dens suggesting  remote arthritis, stable from 2017 face CT. Sinuses/Orbits: Partial right mastoid opacification. Negative nasopharynx. Other: Motion degraded study requiring fast protocol technique. MRA HEAD FINDINGS Carotid and vertebral arteries are smooth and diffusely patent. No branch occlusion, beading, or aneurysm. Mild motion degradation. IMPRESSION: 1. Small acute infarct in the left corona radiata. 2. Tiny acute or subacute infarct in the right putamen. 3. Background of moderate chronic small vessel ischemia. 4. Negative intracranial MRA with mild motion artifact. Electronically Signed   By: Marnee Spring M.D.   On: 09/06/2018 07:14    ASSESSMENT AND PLAN:   Principal Problem:   Right leg weakness Active Problems:   COPD (chronic obstructive pulmonary disease) (HCC)   HTN (hypertension)   HLD (hyperlipidemia)   Stroke (HCC)   * CVA- Right leg weakness       MRI confirms acute stroke    Carotid doppler- no blockages    Echo awaited. May need TEE per Neuro   LDL is < 70   PT and OT eval- may need SNF   SLP eval.  *  COPD (chronic obstructive pulmonary disease) (HCC) -continue home meds  *  HTN (hypertension) -permissive hypertension initially, started on antihypertensive now.  *  HLD (hyperlipidemia) -Home dose antilipid   All the records are reviewed and case discussed with Care Management/Social Workerr. Management plans discussed with the patient, family and they are in agreement.  CODE STATUS: Full.  TOTAL TIME TAKING CARE OF THIS PATIENT: 35 minutes.     POSSIBLE D/C IN 1-2 DAYS, DEPENDING ON CLINICAL CONDITION.   Altamese Dilling M.D on 09/07/2018   Between 7am to 6pm - Pager - [phone]  After 6pm go to [email or web address] - password [email or web address]  [facility]  Office  [phone]  CC: Primary care physician; Oswaldo Conroy, MD  Note: This dictation was  prepared with Dragon dictation along with smaller phrase technology. Any transcriptional  errors that result from this process are unintentional.

## 2018-09-07 NOTE — Progress Notes (Signed)
SLP Cancellation Note  Patient Details Name: Audrey Sexton MRN: [id number] DOB: [birth year]   Cancelled treatment:       Reason Eval/Treat Not Completed: SLP screened, no needs identified, will sign off(chart reviewed; consulted NSG then met w/ pt in room). Pt denied any difficulty swallowing and is currently on a regular diet; assisted pt w/ tray setup d/t RUE weakness then pt fed self w/ R hand assisting L hand. She tolerates swallowing pills w/ water per NSG. Pt conversed at conversational level w/out deficits noted; pt and NSG denied any speech-language deficits.  No further skilled ST services indicated as pt appears at her communication baseline. Education given on general aspiration precautions w/ pill swallowing; po's while in bed. Pt agreed. NSG to reconsult if any change in status.    Orinda Kenner, MS, CCC-SLP Hanah Moultry 09/07/2018, 9:47 AM

## 2018-09-07 NOTE — Clinical Social Work Note (Signed)
Clinical Social Work Assessment  Patient Details  Name: Audrey Sexton MRN: [id number] Date of Birth: 12/14/1950  Date of referral:  09/07/18               Reason for consult:  Discharge Planning                Permission sought to share information with:  Case Manager, Facility Sport and exercise psychologist, Family Supports Permission granted to share information::  Yes, Verbal Permission Granted  Name::     Audrey Sexton, Audrey Sexton Daughter   [phone]   Agency::  All SNFs listed on medicare.gov list for [place]  Relationship::  Audrey Sexton, Audrey Sexton Daughter   [phone]   Contact Information:  Audrey Sexton, Audrey Sexton Daughter   [phone]   Housing/Transportation Living arrangements for the past 2 months:  Apartment([facility]) Source of Information:  Patient Patient Interpreter Needed:  None Criminal Activity/Legal Involvement Pertinent to Current Situation/Hospitalization:  No - Comment as needed Significant Relationships:  Adult Children(Audrey Sexton,Audrey Sexton Daughter   [phone] ) Lives with:  Self Do you feel safe going back to the place where you live?  No Need for family participation in patient care:  No (Coment)  Care giving concerns:  PT recommends SNF   Social Worker assessment / plan:  CSW met with pt to discussed discharge planning.  Pt agreed to PT recommendation for SNF.  Pt is a 68 y.o., female that live alone.  Family member (daughter) lives one hour away.  Pt is able to attend to ADLs on her own.  Pt stated that she was in her bathroom and all she remember is waling up on the floor in her bathroom. Pt stated that "I need to get stronger."   Pt has given verbal permission for referral to SNF. CSW has confirmed PASRR and has sent referral. The pt would need prior authorization from [facility]. CSW will provide bed offers as available. CSW is following.    Employment status:  Retired Forensic scientist:  Medicare PT Recommendations:  [place] / Referral to community resources:  [facility]  Patient/Family's Response to care:  Pt agreeable to and understands plan. Pt give opportunity to ask questions.  Pt's concerns were address.   Patient/Family's Understanding of and Emotional Response to Diagnosis, Current Treatment, and Prognosis:  Pt demonstrated good understanding.  Emotional Assessment Appearance:  Appears stated age, Well-Groomed Attitude/Demeanor/Rapport:  Engaged(calm, attnetive, cooperative) Affect (typically observed):  Calm Orientation:  Oriented to Self, Oriented to Place, Oriented to  Time, Oriented to Situation Alcohol / Substance use:  Not Applicable Psych involvement (Current and /or in the community):  Outpatient Provider  Discharge Needs  Concerns to be addressed:  Lack of Support, Discharge Planning Concerns, Care Coordination Readmission within the last 30 days:  No Current discharge risk:  Lives alone, Chronically ill Barriers to Discharge:  Continued Medical Work up   [employer] 09/07/2018, 2:29 PM

## 2018-09-07 NOTE — Clinical Social Work Note (Signed)
CSW is aware through chart review that pt admitted from [facility]. Assessment to follow. CSW is following.   Larwance Rote, MSW, LCSW  [phone]

## 2018-09-07 NOTE — [place] FL2 (Addendum)
[place] MEDICAID FL2 LEVEL OF CARE SCREENING TOOL     IDENTIFICATION  Patient Name: Audrey Sexton Birthdate: 1951/01/16 Sex: female Admission Date (Current Location): 09/05/2018  [place] and [place] Number:  Chiropodist and Address:  [facility], [address] [place], [place] [address]      Provider Number: [id number]  Attending Physician Name and Address:  Altamese Dilling, *  Relative Name and Phone Number:       Current Level of Care: Hospital  Recommended Level of Care: Skilled Nursing Facility Prior Approval Number:    Date Approved/Denied:   PASRR Number: [id number]  Discharge Plan: SNF    Current Diagnoses: Patient Active Problem List   Diagnosis Date Noted  . Right leg weakness 09/06/2018  . HTN (hypertension) 09/06/2018  . HLD (hyperlipidemia) 09/06/2018  . Stroke (HCC) 09/06/2018  . Acute respiratory failure with hypoxia (HCC) 06/09/2017  . Acute respiratory distress   . DNR (do not resuscitate) discussion 08/23/2016  . Palliative care by specialist 08/23/2016  . Dyspnea 08/23/2016  . COPD (chronic obstructive pulmonary disease) (HCC) 08/18/2016    Orientation RESPIRATION BLADDER Height & Weight     Self, Time, Situation, Place  O2(3L o2) Continent Weight: 199 lb 4.7 oz (90.4 kg) Height:  5\' 4"  (162.6 cm)  BEHAVIORAL SYMPTOMS/MOOD NEUROLOGICAL BOWEL NUTRITION STATUS      Continent Diet(Heart healthy carb modified)  AMBULATORY STATUS COMMUNICATION OF NEEDS Skin   Extensive Assist Verbally Normal                       Personal Care Assistance Level of Assistance  Bathing, Feeding, Dressing Bathing Assistance: Limited assistance Feeding assistance: Independent Dressing Assistance: Limited assistance     Functional Limitations Info  Sight, Hearing, Speech Sight Info: Adequate Hearing Info: Adequate Speech Info: Adequate    SPECIAL CARE FACTORS FREQUENCY  PT (By licensed PT)     PT  Frequency: 5x per week              Contractures Contractures Info: Not present    Additional Factors Info  Code Status, Allergies Code Status Info: Full code Allergies Info: Percocet Oxycodone-acetaminophen, Vicodin Hydrocodone-acetaminophen, Penicillins           Current Medications (09/07/2018):  This is the current hospital active medication list Current Facility-Administered Medications  Medication Dose Route Frequency Provider Last Rate Last Dose  .  stroke: mapping our early stages of recovery book   Does not apply Once Oralia Manis, MD      . acetaminophen (TYLENOL) tablet 650 mg  650 mg Oral Q4H PRN Oralia Manis, MD   650 mg at 09/06/18 7017   Or  . acetaminophen (TYLENOL) solution 650 mg  650 mg Per Tube Q4H PRN Oralia Manis, MD       Or  . acetaminophen (TYLENOL) suppository 650 mg  650 mg Rectal Q4H PRN Oralia Manis, MD      . amLODipine (NORVASC) tablet 10 mg  10 mg Oral Daily Altamese Dilling, MD   10 mg at 09/07/18 1241  . atorvastatin (LIPITOR) tablet 40 mg  40 mg Oral Lamont Snowball, MD   40 mg at 09/06/18 2221  . enoxaparin (LOVENOX) injection 40 mg  40 mg Subcutaneous Q24H Oralia Manis, MD   40 mg at 09/07/18 0518  . fluticasone furoate-vilanterol (BREO ELLIPTA) 100-25 MCG/INH 1 puff  1 puff Inhalation Daily Oralia Manis, MD      . MEDLINE  mouth rinse  15 mL Mouth Rinse BID Altamese Dilling, MD   15 mL at 09/07/18 1241  . oxybutynin (DITROPAN) tablet 5 mg  5 mg Oral TID Oralia Manis, MD   5 mg at 09/07/18 1240  . umeclidinium bromide (INCRUSE ELLIPTA) 62.5 MCG/INH 1 puff  1 puff Inhalation Daily Oralia Manis, MD      . venlafaxine XR (EFFEXOR-XR) 24 hr capsule 150 mg  150 mg Oral Lamont Snowball, MD   150 mg at 09/06/18 2220     Discharge Medications: Please see discharge summary for a list of discharge medications.  Relevant Imaging Results:  Relevant Lab Results:   Additional Information SSN: [id number]  Larwance Rote, LCSW

## 2018-09-07 NOTE — Progress Notes (Signed)
Physical Therapy Treatment Patient Details Name: Audrey Sexton MRN: [id number] DOB: Sep 01, 1950 Today's Date: 09/07/2018    History of Present Illness Audrey Sexton  is a 68 y.o. female who presents with chief complaint RLE weakness.  Patient states that she went to use her bathroom and woke up on the floor.  She does not recall what happened or how long she was unconscious.  When she woke up she had weakness in her right lower extremity, this is new for her. Storke work up completed in ED; MRI showed Small acute infarct in the left corona radiata, Tiny acute or subacute infarct in the right putamen. PMH also includes asthma (3L o2 in the home), COPD, HTN, and acute respiratory failure.     PT Comments    Pt in bed, ready for session.  Participated in exercises as described below.  Pt stated she is able to wiggle her toes today which she was unable to yesterday.  She was able to transition to edge of bed with min a x 1.  Sitting unsupported with supervision for safety.  She was able to stand with HH mod a x 2.  With increased time and occasional assist to advance/move RLE she was able to take several small steps to recliner at bedside.  Overall tolerated activity well with improved mobility today.   SNF was recommended at eval but pt may benefit from CIR consult to see if she is appropriate for the program. She is motivated to improve and return home.    Follow Up Recommendations  SNF;CIR     Equipment Recommendations       Recommendations for Other Services       Precautions / Restrictions Precautions Precautions: Fall Restrictions Weight Bearing Restrictions: No    Mobility  Bed Mobility Overal bed mobility: Needs Assistance Bed Mobility: Rolling;Supine to Sit;Sit to Supine Rolling: Min guard   Supine to sit: Min assist        Transfers Overall transfer level: Needs assistance Equipment used: 2 person hand held assist Transfers: Sit to/from [employer] Sit to Stand: Mod assist;Min assist Stand pivot transfers: Min assist;Mod assist       General transfer comment: Pt able to stand and with increased time and occasional assist to move RLE was able to transfer to recliner at bedside this am.  Ambulation/Gait             General Gait Details: unable to intiate   Stairs             Wheelchair Mobility    Modified Rankin (Stroke Patients Only)       Balance Overall balance assessment: Needs assistance Sitting-balance support: Feet unsupported Sitting balance-Leahy Scale: Fair     Standing balance support: Bilateral upper extremity supported Standing balance-Leahy Scale: Poor                              Cognition Arousal/Alertness: Awake/alert Behavior During Therapy: WFL for tasks assessed/performed Overall Cognitive Status: Within Functional Limits for tasks assessed                                        Exercises Other Exercises Other Exercises: supine AAROM for ankle pumps, heel slides, SLR, ab/add and SAQ 2 x 10    General Comments  Pertinent Vitals/Pain Pain Assessment: No/denies pain    Home Living                      Prior Function            PT Goals (current goals can now be found in the care plan section) Acute Rehab PT Goals Patient Stated Goal: to return to PLOF Progress towards PT goals: Progressing toward goals    Frequency    7X/week      PT Plan Current plan remains appropriate    Co-evaluation              AM-PAC PT "6 Clicks" Mobility   Outcome Measure  Help needed turning from your back to your side while in a flat bed without using bedrails?: A Little Help needed moving from lying on your back to sitting on the side of a flat bed without using bedrails?: A Little Help needed moving to and from a bed to a chair (including a wheelchair)?: A Lot Help needed standing up from a chair using your arms (e.g.,  wheelchair or bedside chair)?: A Lot Help needed to walk in hospital room?: Total Help needed climbing 3-5 steps with a railing? : Total 6 Click Score: 12    End of Session Equipment Utilized During Treatment: Gait belt;Oxygen;Other (comment) Activity Tolerance: Patient tolerated treatment well Patient left: in chair;with call bell/phone within reach;with chair alarm set Nurse Communication: Mobility status Hemiplegia - Right/Left: Right Hemiplegia - dominant/non-dominant: Dominant Hemiplegia - caused by: Cerebral infarction     Time: [id number] PT Time Calculation (min) (ACUTE ONLY): 23 min  Charges:  $Gait Training: 8-22 mins $Therapeutic Exercise: 8-22 mins                    Audrey Sexton, PTA 09/07/18, 11:59 AM

## 2018-09-07 NOTE — Progress Notes (Signed)
*  PRELIMINARY RESULTS* Echocardiogram 2D Echocardiogram has been performed. A Bubble Study was requested & performed on this exam.  Audrey Sexton 09/07/2018, 2:43 PM

## 2018-09-07 NOTE — Progress Notes (Signed)
Subjective: Patient continues to have RLE weakness but no new neurological complaints.    Objective: Current vital signs: BP (!) 160/86   Pulse 99   Temp 97.8 F (36.6 C) (Oral)   Resp 16   Ht 5\' 4"  (1.626 m)   Wt 90.4 kg   SpO2 100%   BMI 34.21 kg/m  Vital signs in last 24 hours: Temp:  [97.3 F (36.3 C)-98.7 F (37.1 C)] 97.8 F (36.6 C) (01/11 0736) Pulse Rate:  [91-104] 99 (01/11 0736) Resp:  [16] 16 (01/11 0418) BP: (140-168)/(57-94) 160/86 (01/11 0736) SpO2:  [96 %-100 %] 100 % (01/11 0736) Weight:  [90.4 kg] 90.4 kg (01/10 1542)  Intake/Output from previous day: No intake/output data recorded. Intake/Output this shift: No intake/output data recorded. Nutritional status:  Diet Order            Diet heart healthy/carb modified Room service appropriate? Yes; Fluid consistency: Thin  Diet effective now              Neurologic Exam: Mental Status: Alert, oriented, thought content appropriate. Speech fluent without evidence of aphasia. Able to follow 3 step commands without difficulty. Attention span and concentration seemed appropriate  Cranial Nerves: II: Discs flat bilaterally; Visual fields grossly normal, pupils equal, round, reactive to light and accommodation III,IV, VI: ptosis not present, extra-ocular motions intact bilaterally V,VII: smile symmetric, facial light touch sensationintact VIII: hearing normal bilaterally IX,X: gag reflex present XI: bilateral shoulder shrug XII: midline tongue extension Motor: Unable to lift the right leg off the bed  Lab Results: Basic Metabolic Panel: Recent Labs  Lab 09/05/18 1623 09/06/18 0529  NA 142  --   K 4.0  --   CL 102  --   CO2 32  --   GLUCOSE 152*  --   BUN 15  --   CREATININE 1.16* 1.09*  CALCIUM 9.5  --     Liver Function Tests: No results for input(s): AST, ALT, ALKPHOS, BILITOT, PROT, ALBUMIN in the last 168 hours. No results for input(s): LIPASE, AMYLASE in the last 168 hours. No  results for input(s): AMMONIA in the last 168 hours.  CBC: Recent Labs  Lab 09/05/18 1623 09/06/18 0529  WBC 11.5* 10.2  HGB 14.6 13.7  HCT 46.9* 43.7  MCV 92.7 91.2  PLT 262 228    Cardiac Enzymes: Recent Labs  Lab 09/05/18 1948  TROPONINI <0.03    Lipid Panel: Recent Labs  Lab 09/06/18 0529  CHOL 130  TRIG 146  HDL 33*  CHOLHDL 3.9  VLDL 29  LDLCALC 68    CBG: No results for input(s): GLUCAP in the last 168 hours.  Microbiology: Results for orders placed or performed during the hospital encounter of 08/02/18  Blood Culture (routine x 2)     Status: None   Collection Time: 08/02/18  7:27 AM  Result Value Ref Range Status   Specimen Description BLOOD LT HAND  Final   Special Requests   Final    BOTTLES DRAWN AEROBIC AND ANAEROBIC Blood Culture adequate volume   Culture   Final    NO GROWTH 5 DAYS Performed at [facility], [address], [place], [place] [address]    Report Status 08/07/2018 FINAL  Final  Blood Culture (routine x 2)     Status: None   Collection Time: 08/02/18  7:27 AM  Result Value Ref Range Status   Specimen Description BLOOD RT [facility]  Final   Special Requests   Final  BOTTLES DRAWN AEROBIC AND ANAEROBIC Blood Culture results may not be optimal due to an excessive volume of blood received in culture bottles   Culture   Final    NO GROWTH 5 DAYS Performed at [facility], [address] [place], [place] [address]    Report Status 08/07/2018 FINAL  Final  Urine culture     Status: None   Collection Time: 08/02/18 10:36 AM  Result Value Ref Range Status   Specimen Description   Final    URINE, RANDOM Performed at [facility], [address] [place], [place] [address]    Special Requests   Final    NONE Performed at [facility], [address] [place], [place] [address]    Culture   Final    NO GROWTH Performed at [facility], [address] [place], [place] [address]     Report Status 08/03/2018 FINAL  Final    Coagulation Studies: No results for input(s): LABPROT, INR in the last 72 hours.  Imaging: Dg Chest 1 View  Result Date: 09/05/2018 CLINICAL DATA:  Weakness and pain after a fall. EXAM: CHEST  1 VIEW COMPARISON:  08/02/2018 FINDINGS: Shallow inspiration with linear atelectasis in the lung bases. Heart size and pulmonary vascularity are normal. 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 No airspace disease or consolidation. No blunting of costophrenic angles. No pneumothorax. Calcified and tortuous aorta. Degenerative changes in the spine and shoulders. Similar appearance to previous study. IMPRESSION: Shallow inspiration with linear atelectasis in the lung bases. Chronic fibrosis in the bases. No active pulmonary disease identified. Electronically Signed   By: Burman NievesWilliam  Stevens M.D.   On: 09/05/2018 23:10   Dg Pelvis 1-2 Views  Result Date: 09/05/2018 CLINICAL DATA:  Weakness and pain after a fall. EXAM: PELVIS - 1-2 VIEW COMPARISON:  Left hip 12/10/2007 FINDINGS: There is no evidence of pelvic fracture or diastasis. No pelvic bone lesions are seen. Mild degenerative changes in the hips and lower lumbar spine. IMPRESSION: No acute bony abnormalities.  Mild degenerative changes. Electronically Signed   By: Burman NievesWilliam  Stevens M.D.   On: 09/05/2018 23:10   Ct Head Wo Contrast  Result Date: 09/05/2018 CLINICAL DATA:  Patient fell earlier joint is a bathroom. Right leg weakness. EXAM: CT HEAD WITHOUT CONTRAST TECHNIQUE: Contiguous axial images were obtained from the base of the skull through the vertex without intravenous contrast. COMPARISON:  12/08/2015 FINDINGS: Brain: Chronic moderate small vessel ischemic disease of periventricular, deep and subcortical white matter more so along the frontoparietal regions. No acute large vascular territory appearing infarct, midline shift or edema. No acute intracranial hemorrhage or extra-axial fluid collections. No intra-axial mass. No  hydrocephalus. Midline fourth ventricle and basal cisterns without effacement. Vascular: Atherosclerosis of the vertebral arteries. No hyperdense vessel sign. Skull: Intact without fracture. Sinuses/Orbits: Unremarkable. Other: None IMPRESSION: Chronic moderate small vessel ischemic disease of periventricular, deep and subcortical white matter. No acute intracranial abnormality by CT noted. Electronically Signed   By: Tollie Ethavid  Kwon M.D.   On: 09/05/2018 22:56   Mr Brain Wo Contrast  Result Date: 09/06/2018 CLINICAL DATA:  Focal neuro deficit with stroke suspected. Syncope with right lower extremity weakness EXAM: MRI HEAD WITHOUT CONTRAST MRA HEAD WITHOUT CONTRAST TECHNIQUE: Multiplanar, multiecho pulse sequences of the brain and surrounding structures were obtained without intravenous contrast. Angiographic images of the head were obtained using MRA technique without contrast. COMPARISON:  Head CT from yesterday FINDINGS: MRI HEAD FINDINGS Brain: 13 mm acute  infarct in the left corona radiata. Tiny weakly restricted diffusion focus in the upper right putamen on coronal acquisition. Background of moderate chronic small vessel ischemic change in the cerebral white matter. No hemorrhage, hydrocephalus, or masslike finding. Vascular: Arterial findings below. Normal dural venous sinus flow voids. Skull and upper cervical spine: No focal marrow lesion. Left-sided facet spurring. 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 Sinuses/Orbits: Partial right mastoid opacification. Negative nasopharynx. Other: Motion degraded study requiring fast protocol technique. MRA HEAD FINDINGS Carotid and vertebral arteries are smooth and diffusely patent. No branch occlusion, beading, or aneurysm. Mild motion degradation. IMPRESSION: 1. Small acute infarct in the left corona radiata. 2. Tiny acute or subacute infarct in the right putamen. 3. Background of moderate chronic small vessel ischemia.  4. Negative intracranial MRA with mild motion artifact. Electronically Signed   By: Marnee Spring M.D.   On: 09/06/2018 07:14   US Carotid Bilateral (at [facility] Only)  Result Date: 09/06/2018 CLINICAL DATA:  Right leg weakness for 1 day EXAM: BILATERAL CAROTID DUPLEX ULTRASOUND TECHNIQUE: Wallace Cullens scale imaging, color Doppler and duplex ultrasound were performed of bilateral carotid and vertebral arteries in the neck. COMPARISON:  None. FINDINGS: Criteria: Quantification of carotid stenosis is based on velocity parameters that correlate the residual internal carotid diameter with NASCET-based stenosis levels, using the diameter of the distal internal carotid lumen as the denominator for stenosis measurement. The following velocity measurements were obtained: RIGHT ICA: 63 cm/sec CCA: 61 cm/sec SYSTOLIC ICA/CCA RATIO:  1.0 ECA: 85 cm/sec LEFT ICA: 85 cm/sec CCA: 53 cm/sec SYSTOLIC ICA/CCA RATIO:  1.6 ECA: 64 cm/sec RIGHT CAROTID ARTERY: Mild smooth calcified plaque in the bulb. Low resistance internal carotid Doppler pattern. RIGHT VERTEBRAL ARTERY:  Antegrade. LEFT CAROTID ARTERY: Mild calcified plaque along the wall of the bulb. Low resistance internal carotid Doppler pattern is preserved. LEFT VERTEBRAL ARTERY:  Antegrade. IMPRESSION: Less than 50% stenosis in the right and left internal carotid arteries. Electronically Signed   By: Jolaine Click M.D.   On: 09/06/2018 09:00   Mr Maxine Glenn Head/brain ZO Cm  Result Date: 09/06/2018 CLINICAL DATA:  Focal neuro deficit with stroke suspected. Syncope with right lower extremity weakness EXAM: MRI HEAD WITHOUT CONTRAST MRA HEAD WITHOUT CONTRAST TECHNIQUE: Multiplanar, multiecho pulse sequences of the brain and surrounding structures were obtained without intravenous contrast. Angiographic images of the head were obtained using MRA technique without contrast. COMPARISON:  Head CT from yesterday FINDINGS: MRI HEAD FINDINGS Brain: 13 mm acute infarct in the left corona  radiata. Tiny weakly restricted diffusion focus in the upper right putamen on coronal acquisition. Background of moderate chronic small vessel ischemic change in the cerebral white matter. No hemorrhage, hydrocephalus, or masslike finding. Vascular: Arterial findings below. Normal dural venous sinus flow voids. Skull and upper cervical spine: No focal marrow lesion. Left-sided facet spurring. 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 Sinuses/Orbits: Partial right mastoid opacification. Negative nasopharynx. Other: Motion degraded study requiring fast protocol technique. MRA HEAD FINDINGS Carotid and vertebral arteries are smooth and diffusely patent. No branch occlusion, beading, or aneurysm. Mild motion degradation. IMPRESSION: 1. Small acute infarct in the left corona radiata. 2. Tiny acute or subacute infarct in the right putamen. 3. Background of moderate chronic small vessel ischemia. 4. Negative intracranial MRA with mild motion artifact. Electronically Signed   By: Marnee Spring M.D.   On: 09/06/2018 07:14    Medications:  I  have reviewed the patient's current medications. Scheduled: .  stroke: mapping our early stages of recovery book   Does not apply Once  . amLODipine  10 mg Oral Daily  . atorvastatin  40 mg Oral QHS  . enoxaparin (LOVENOX) injection  40 mg Subcutaneous Q24H  . fluticasone furoate-vilanterol  1 puff Inhalation Daily  . mouth rinse  15 mL Mouth Rinse BID  . oxybutynin  5 mg Oral TID  . umeclidinium bromide  1 puff Inhalation Daily  . venlafaxine XR  150 mg Oral QHS    Assessment/Plan: No new neurological complaints.  Carotid dopplers show no evidence of hemodynamically significant stenosis.  Echocardiogram pending.  A1c pending, LDL 68 on a statin.  Recommendations: 1. Continue statin and ASA.   2. PT consult, OT consult, Speech consult 3. Echocardiogram pending.  Would consider TEE if TTE unremarkable or suboptimal. 4.  Telemetry monitoring 5. Frequent neuro checks 6. If above work up unremarkable would consider outpatient evaluation for prolonged cardiac monitoring.   LOS: 1 day   Thana FarrLeslie Milda Lindvall, MD Neurology [phone] 09/07/2018  12:01 PM

## 2018-09-08 MED ORDER — METOPROLOL TARTRATE 25 MG PO TABS
12.5000 mg | ORAL_TABLET | Freq: Two times a day (BID) | ORAL | Status: DC
  Administered 2018-09-08 – 2018-09-11 (×7): 12.5 mg via ORAL
  Filled 2018-09-08 (×7): qty 1

## 2018-09-08 NOTE — Progress Notes (Signed)
Family Meeting Note  Advance Directive:yes  Today a meeting took place with the Patient.   The following clinical team members were present during this meeting:MD  The following were discussed:Patient's diagnosis: Acute stroke, hypertension, hyperlipidemia, Patient's progosis: Unable to determine and Goals for treatment: Full Code  Per patient, she would like to have a short trial of resuscitation and life support but if the physicians feel there is no chances of recovery then she would not like to have sustained life support.  Additional follow-up to be provided: PMD, neurology  Time spent during discussion:20 minutes  Altamese Dilling, MD

## 2018-09-08 NOTE — Progress Notes (Signed)
PT Cancellation Note  Patient Details Name: SHEQUITA BURGES MRN: [id number] DOB: 03/01/1951   Cancelled Treatment:    Reason Eval/Treat Not Completed: Other (comment)(Patient eating breakfast, nurse in room. ) Patient in process of eating breakfast upon PT entering room. Nurse present. Will attempt again at later time for treatment.   Precious Bard, PT, DPT   09/08/2018, 9:13 AM

## 2018-09-08 NOTE — Progress Notes (Signed)
[employer] [facility] [facility] [place] [facility]   PATIENT NAME: Audrey Sexton    MR#:  [id number]  DATE OF BIRTH:  03/05/1951  SUBJECTIVE:  CHIEF COMPLAINT:   Chief Complaint  Patient presents with  . Fall  . Loss of Consciousness   Came with a fall, have RLL weakness- found to have a new stroke.  Able to move toes on right lower extremity today. REVIEW OF SYSTEMS:  CONSTITUTIONAL: No fever, fatigue or weakness.  EYES: No blurred or double vision.  EARS, NOSE, AND THROAT: No tinnitus or ear pain.  RESPIRATORY: No cough, shortness of breath, wheezing or hemoptysis.  CARDIOVASCULAR: No chest pain, orthopnea, edema.  GASTROINTESTINAL: No nausea, vomiting, diarrhea or abdominal pain.  GENITOURINARY: No dysuria, hematuria.  ENDOCRINE: No polyuria, nocturia,  HEMATOLOGY: No anemia, easy bruising or bleeding SKIN: No rash or lesion. MUSCULOSKELETAL: No joint pain or arthritis.   NEUROLOGIC: No tingling, numbness, RLL weakness.  PSYCHIATRY: No anxiety or depression.   ROS  DRUG ALLERGIES:   Allergies  Allergen Reactions  . Percocet [Oxycodone-Acetaminophen] Nausea And Vomiting  . Vicodin [Hydrocodone-Acetaminophen] Nausea And Vomiting  . Penicillins Hives    Has patient had a PCN reaction causing immediate rash, facial/tongue/throat swelling, SOB or lightheadedness with hypotension: no Has patient had a PCN reaction causing severe rash involving mucus membranes or skin necrosis: no Has patient had a PCN reaction that required hospitalization  no Has patient had a PCN reaction occurring within the last 10 years: no If all of the above answers are "NO", then may proceed with Cephalosporin use.     VITALS:  Blood pressure 135/75, pulse 97, temperature 97.7 F (36.5 C), temperature source Oral, resp. rate 18, height 5\' 4"  (1.626 m), weight 90.4 kg, SpO2 92 %.  PHYSICAL EXAMINATION:  GENERAL:  68 y.o.-year-old patient lying in the bed with no acute distress.  EYES: Pupils  equal, round, reactive to light and accommodation. No scleral icterus. Extraocular muscles intact.  HEENT: Head atraumatic, normocephalic. Oropharynx and nasopharynx clear.  NECK:  Supple, no jugular venous distention. No thyroid enlargement, no tenderness.  LUNGS: Normal breath sounds bilaterally, no wheezing, rales,rhonchi or crepitation. No use of accessory muscles of respiration.  CARDIOVASCULAR: S1, S2 normal. No murmurs, rubs, or gallops.  ABDOMEN: Soft, nontender, nondistended. Bowel sounds present. No organomegaly or mass.  EXTREMITIES: No pedal edema, cyanosis, or clubbing.  NEUROLOGIC: Cranial nerves II through XII are intact. Muscle strength 5/5 in all extremities except 0/5 in RLL-today able to move only the great toe on the right side.  Also able to move leg very slightly on the right side.  Sensation intact. Gait not checked.  PSYCHIATRIC: The patient is alert and oriented x 3.  SKIN: No obvious rash, lesion, or ulcer.   Physical Exam LABORATORY PANEL:   CBC Recent Labs  Lab 09/06/18 0529  WBC 10.2  HGB 13.7  HCT 43.7  PLT 228   ------------------------------------------------------------------------------------------------------------------  Chemistries  Recent Labs  Lab 09/05/18 1623 09/06/18 0529  NA 142  --   K 4.0  --   CL 102  --   CO2 32  --   GLUCOSE 152*  --   BUN 15  --   CREATININE 1.16* 1.09*  CALCIUM 9.5  --    ------------------------------------------------------------------------------------------------------------------  Cardiac Enzymes Recent Labs  Lab 09/05/18 1948  TROPONINI <0.03   ------------------------------------------------------------------------------------------------------------------  RADIOLOGY:  No results found.  ASSESSMENT AND PLAN:   Principal Problem:   Right leg weakness Active  Problems:   COPD (chronic obstructive pulmonary disease) (HCC)   HTN (hypertension)   HLD (hyperlipidemia)   Stroke (HCC)   *  CVA- Right leg weakness       MRI confirms acute stroke    Carotid doppler- no blockages    Echo awaited. May need TEE per Neuro- contacted cardiologist and scheduled for Monday.   LDL is < 70   PT and OT eval- may need SNF   SLP eval done.  *  COPD (chronic obstructive pulmonary disease) (HCC) -continue home meds  *  HTN (hypertension) -permissive hypertension initially, started on antihypertensive now.  *  HLD (hyperlipidemia) -Home dose antilipid, LDL 68.   All the records are reviewed and case discussed with Care Management/Social Workerr. Management plans discussed with the patient, family and they are in agreement.  CODE STATUS: Full.  TOTAL TIME TAKING CARE OF THIS PATIENT: 35 minutes.     POSSIBLE D/C IN 1-2 DAYS, DEPENDING ON CLINICAL CONDITION.   Altamese Dilling M.D on 09/08/2018   Between 7am to 6pm - Pager - [phone]  After 6pm go to [email or web address] - password [email or web address]  [facility]  Office  [phone]  CC: Primary care physician; Oswaldo Conroy, MD  Note: This dictation was prepared with Dragon dictation along with smaller phrase technology. Any transcriptional errors that result from this process are unintentional.

## 2018-09-08 NOTE — Progress Notes (Signed)
Physical Therapy Treatment Patient Details Name: Audrey Sexton MRN: [id number] DOB: 1950-09-13 Today's Date: 09/08/2018    History of Present Illness Audrey Sexton  is a 68 y.o. female who presents with chief complaint RLE weakness.  Patient states that she went to use her bathroom and woke up on the floor.  She does not recall what happened or how long she was unconscious.  When she woke up she had weakness in her right lower extremity, this is new for her. Storke work up completed in ED; MRI showed Small acute infarct in the left corona radiata, Tiny acute or subacute infarct in the right putamen. PMH also includes asthma (3L o2 in the home), COPD, HTN, and acute respiratory failure.     PT Comments    Patient is awake and oriented upon PT arrival to room. Nurse in room at entrance. Patient interactive and engaged throughout session. Is pleasant and well motivated throughout supine interventions but reports her leg has been bothering her and doesn't feel up to standing. Agreeable to attempt standing tomorrow. Patient demonstrates ability to perform active muscle recruitment of toes as well as partial recruitment of abductions however has limited quadriceps and hamstring musculature. Patient educated on need for continued strengthening throughout the day and performed quad sets and gluteal squeezes demonstrating understanding. Continuing with current plan of care. Continued need for skilled physical therapy for strength, ROM, mobility, and transfers to increase patient's safety and decrease falls risk.     Follow Up Recommendations  SNF;CIR     Equipment Recommendations       Recommendations for Other Services       Precautions / Restrictions Precautions Precautions: Fall Restrictions Weight Bearing Restrictions: No    Mobility  Bed Mobility Overal bed mobility: Needs Assistance Bed Mobility: Rolling Rolling: Min guard            Transfers                     Ambulation/Gait             General Gait Details: unable to intiate   Stairs             Wheelchair Mobility    Modified Rankin (Stroke Patients Only)       Balance                                            Cognition Arousal/Alertness: Awake/alert Behavior During Therapy: WFL for tasks assessed/performed Overall Cognitive Status: Within Functional Limits for tasks assessed                                 General Comments: Engaged and interactive.       Exercises Other Exercises Other Exercises: Supine: AROM: R toes flexion/extension 15x, LLE: ankle pumps x 20, heel slides x 15, abd/add 15x, SAQ 15x, Other Exercises: Supine AAROM RLE: ankle pumps 15x, heel slides 15x, SLR 15x, abd/add 15x, SAQ 15x ( 2 sets)  Other Exercises: Supine strengthening: quad set RLE 10x 5 second holds, gluteal squeezes 10x 5 second holds ( 2 sets)    General Comments        Pertinent Vitals/Pain Pain Assessment: No/denies pain    Home Living  Prior Function            PT Goals (current goals can now be found in the care plan section) Acute Rehab PT Goals Patient Stated Goal: to return to PLOF PT Goal Formulation: With patient Time For Goal Achievement: 09/20/18 Progress towards PT goals: Progressing toward goals    Frequency    7X/week      PT Plan Current plan remains appropriate    Co-evaluation              AM-PAC PT "6 Clicks" Mobility   Outcome Measure  Help needed turning from your back to your side while in a flat bed without using bedrails?: A Little Help needed moving from lying on your back to sitting on the side of a flat bed without using bedrails?: A Little Help needed moving to and from a bed to a chair (including a wheelchair)?: A Lot Help needed standing up from a chair using your arms (e.g., wheelchair or bedside chair)?: A Lot Help needed to walk in hospital room?:  Total Help needed climbing 3-5 steps with a railing? : Total 6 Click Score: 12    End of Session   Activity Tolerance: Patient tolerated treatment well Patient left: with call bell/phone within reach;in bed;with bed alarm set Nurse Communication: Mobility status PT Visit Diagnosis: Unsteadiness on feet (R26.81);Difficulty in walking, not elsewhere classified (R26.2);Hemiplegia and hemiparesis;Muscle weakness (generalized) (M62.81) Hemiplegia - Right/Left: Right Hemiplegia - dominant/non-dominant: Dominant Hemiplegia - caused by: Cerebral infarction     Time: 5379-4327 PT Time Calculation (min) (ACUTE ONLY): 24 min  Charges:  $Therapeutic Exercise: 23-37 mins                     Precious Bard, PT, DPT    Precious Bard 09/08/2018, 2:10 PM

## 2018-09-08 NOTE — Plan of Care (Signed)

## 2018-09-08 NOTE — Progress Notes (Signed)
Asked to proceed with tee. Discussed with patient. TEE in am

## 2018-09-09 ENCOUNTER — Encounter
Admission: EM | Disposition: A | Discharge status: Skilled Nursing Facility | Payer: Self-pay | Source: Home / Self Care | Attending: Specialist

## 2018-09-09 ENCOUNTER — Inpatient Hospital Stay
Admit: 2018-09-09 | Discharge: 2018-09-09 | Disposition: A | Discharge status: Home / Self Care | Payer: Medicare Other | Attending: Cardiology | Admitting: Cardiology

## 2018-09-09 HISTORY — PX: TEE WITHOUT CARDIOVERSION: SHX5443

## 2018-09-09 LAB — HEMOGLOBIN A1C
Hgb A1c MFr Bld: 6.7 % — ABNORMAL HIGH (ref 4.8–5.6)
Mean Plasma Glucose: 145.59 mg/dL

## 2018-09-09 SURGERY — ECHOCARDIOGRAM, TRANSESOPHAGEAL
Anesthesia: Moderate Sedation

## 2018-09-09 MED ORDER — BUTAMBEN-TETRACAINE-BENZOCAINE 2-2-14 % EX AERO
INHALATION_SPRAY | CUTANEOUS | Status: AC
  Filled 2018-09-09: qty 5

## 2018-09-09 MED ORDER — MIDAZOLAM HCL 5 MG/5ML IJ SOLN
INTRAMUSCULAR | Status: AC
  Filled 2018-09-09: qty 5

## 2018-09-09 MED ORDER — SODIUM CHLORIDE 0.9 % IV SOLN: INTRAVENOUS | Status: DC

## 2018-09-09 MED ORDER — FENTANYL CITRATE (PF) 100 MCG/2ML IJ SOLN
INTRAMUSCULAR | Status: AC | PRN
  Administered 2018-09-09: 50 ug via INTRAVENOUS

## 2018-09-09 MED ORDER — BUTAMBEN-TETRACAINE-BENZOCAINE 2-2-14 % EX AERO
INHALATION_SPRAY | CUTANEOUS | Status: AC | PRN
  Administered 2018-09-09: 4 via TOPICAL

## 2018-09-09 MED ORDER — LIDOCAINE VISCOUS HCL 2 % MT SOLN
OROMUCOSAL | Status: AC | PRN
  Administered 2018-09-09: 15 mL via OROMUCOSAL

## 2018-09-09 MED ORDER — FENTANYL CITRATE (PF) 100 MCG/2ML IJ SOLN
INTRAMUSCULAR | Status: AC
  Filled 2018-09-09: qty 2

## 2018-09-09 MED ORDER — MIDAZOLAM HCL 5 MG/5ML IJ SOLN
INTRAMUSCULAR | Status: AC | PRN
  Administered 2018-09-09: 2 mg via INTRAVENOUS
  Administered 2018-09-09: 1 mg via INTRAVENOUS

## 2018-09-09 MED ORDER — IPRATROPIUM-ALBUTEROL 0.5-2.5 (3) MG/3ML IN SOLN
RESPIRATORY_TRACT | Status: AC
  Filled 2018-09-09: qty 3

## 2018-09-09 MED ORDER — LIDOCAINE VISCOUS HCL 2 % MT SOLN
OROMUCOSAL | Status: AC
  Filled 2018-09-09: qty 15

## 2018-09-09 MED ORDER — SODIUM CHLORIDE FLUSH 0.9 % IV SOLN
INTRAVENOUS | Status: AC
  Filled 2018-09-09: qty 10

## 2018-09-09 NOTE — Progress Notes (Signed)
PT Cancellation Note  Patient Details Name: Audrey Sexton MRN: [id number] DOB: 28-Dec-1950   Cancelled Treatment:    Reason Eval/Treat Not Completed: Other (comment);Patient at procedure or test/unavailable(Pt out of room at this time for a procedure, PT will follow up as able.)   Olga Coaster PT, DPT 9:30 AM,09/09/18 [phone]

## 2018-09-09 NOTE — Sedation Documentation (Signed)
NRB mask on secondary to sats. 85% cont.

## 2018-09-09 NOTE — Progress Notes (Signed)
Subjective: Patient continues to have right lower extremity weakness but no new strokes or stroke like symptoms.   Objective: Current vital signs: BP 135/72 (BP Location: Right Arm)   Pulse 99   Temp 97.7 F (36.5 C) (Oral)   Resp 18   Ht 5\' 4"  (1.626 m)   Wt 90.4 kg   SpO2 92%   BMI 34.21 kg/m  Vital signs in last 24 hours: Temp:  [97.5 F (36.4 C)-99 F (37.2 C)] 97.7 F (36.5 C) (01/13 1222) Pulse Rate:  [84-112] 99 (01/13 1222) Resp:  [15-18] 18 (01/13 1222) BP: (117-139)/(53-90) 135/72 (01/13 1222) SpO2:  [85 %-98 %] 92 % (01/13 1222) Weight:  [90.4 kg] 90.4 kg (01/13 0815)  Intake/Output from previous day: 01/12 0701 - 01/13 0700 In: 360 [P.O.:360] Out: 1000 [Urine:1000] Intake/Output this shift: No intake/output data recorded. Nutritional status:  Diet Order            DIET SOFT Room service appropriate? Yes; Fluid consistency: Thin  Diet effective now             Neurologic Exam: Mental Status: Alert, oriented, thought content appropriate. Speech fluent without evidence of aphasia. Able to follow 3 step commands without difficulty. Attention span and concentration seemed appropriate  Cranial Nerves: II: Discs flat bilaterally; Visual fields grossly normal, pupils equal, round, reactive to light and accommodation III,IV, VI: ptosis not present, extra-ocular motions intact bilaterally V,VII: smile symmetric, facial light touch sensationintact VIII: hearing normal bilaterally IX,X: gag reflex present XI: bilateral shoulder shrug XII: midline tongue extension Motor: Unable to lift the right leg off the bed  Lab Results: Basic Metabolic Panel: Recent Labs  Lab 09/05/18 1623 09/06/18 0529  NA 142  --   K 4.0  --   CL 102  --   CO2 32  --   GLUCOSE 152*  --   BUN 15  --   CREATININE 1.16* 1.09*  CALCIUM 9.5  --     Liver Function Tests: No results for input(s): AST, ALT, ALKPHOS, BILITOT, PROT, ALBUMIN in the last 168 hours. No results for  input(s): LIPASE, AMYLASE in the last 168 hours. No results for input(s): AMMONIA in the last 168 hours.  CBC: Recent Labs  Lab 09/05/18 1623 09/06/18 0529  WBC 11.5* 10.2  HGB 14.6 13.7  HCT 46.9* 43.7  MCV 92.7 91.2  PLT 262 228    Cardiac Enzymes: Recent Labs  Lab 09/05/18 1948  TROPONINI <0.03    Lipid Panel: Recent Labs  Lab 09/06/18 0529  CHOL 130  TRIG 146  HDL 33*  CHOLHDL 3.9  VLDL 29  LDLCALC 68    CBG: No results for input(s): GLUCAP in the last 168 hours.  Microbiology: Results for orders placed or performed during the hospital encounter of 08/02/18  Blood Culture (routine x 2)     Status: None   Collection Time: 08/02/18  7:27 AM  Result Value Ref Range Status   Specimen Description BLOOD LT HAND  Final   Special Requests   Final    BOTTLES DRAWN AEROBIC AND ANAEROBIC Blood Culture adequate volume   Culture   Final    NO GROWTH 5 DAYS Performed at [facility], [address] [place], [place] [address]    Report Status 08/07/2018 FINAL  Final  Blood Culture (routine x 2)     Status: None   Collection Time: 08/02/18  7:27 AM  Result Value Ref Range Status   Specimen Description BLOOD  RT [facility]  Final   Special Requests   Final    BOTTLES DRAWN AEROBIC AND ANAEROBIC Blood Culture results may not be optimal due to an excessive volume of blood received in culture bottles   Culture   Final    NO GROWTH 5 DAYS Performed at [facility], [address], [place], [place] [address]    Report Status 08/07/2018 FINAL  Final  Urine culture     Status: None   Collection Time: 08/02/18 10:36 AM  Result Value Ref Range Status   Specimen Description   Final    URINE, RANDOM Performed at [facility], [address], [place], [place] [address]    Special Requests   Final    NONE Performed at [facility], [address] [place], [place] [address]    Culture   Final    NO GROWTH Performed at [facility], [address] [place], [place] [address]    Report Status 08/03/2018 FINAL  Final    Coagulation Studies: No results for input(s): LABPROT, INR in the last 72 hours.  Imaging: No results found.  Medications:  I have reviewed the patient's current medications. Prior to Admission:  Medications Prior to Admission  Medication Sig Dispense Refill Last Dose  . albuterol (PROVENTIL HFA;VENTOLIN HFA) 108 (90 Base) MCG/ACT inhaler Inhale 2-4 puffs by mouth every 4 hours as needed for wheezing, cough, and/or shortness of breath 1 Inhaler 1 Past Month at Unknown time  . Ascorbic Acid (VITAMIN C) 1000 MG tablet Take 1,000 mg by mouth daily.   09/05/2018 at Unknown time  . atorvastatin (LIPITOR) 40 MG tablet Take 40 mg by mouth at bedtime.    09/04/2018 at Unknown time  . Cholecalciferol (VITAMIN D-1000 MAX ST) 1000 units tablet Take 1,000 Units by mouth daily.   09/05/2018 at Unknown time  . diltiazem (CARDIZEM CD) 240 MG 24 hr capsule Take 240 mg by mouth at bedtime.    09/04/2018 at Unknown time  . fluticasone furoate-vilanterol (BREO ELLIPTA) 100-25 MCG/INH AEPB Inhale 1 puff into the lungs daily.   09/05/2018 at Unknown time  . furosemide (LASIX) 40 MG tablet Take 40 mg by mouth daily.   09/05/2018 at Unknown time  . Multiple Vitamin (MULTI-VITAMIN DAILY PO) Take 1 tablet by mouth daily.   09/05/2018 at Unknown time  . oxybutynin (DITROPAN) 5 MG tablet Take 5 mg by mouth 3 (three) times daily.    09/05/2018 at Unknown time  . potassium chloride SA (K-DUR,KLOR-CON) 20 MEQ tablet Take 1 tablet (20 mEq total) by mouth daily. 15 tablet 0 09/05/2018 at Unknown time  . umeclidinium bromide (INCRUSE ELLIPTA) 62.5 MCG/INH AEPB Inhale 1 puff into the lungs daily.   09/05/2018 at Unknown time  . venlafaxine XR (EFFEXOR-XR) 150 MG 24 hr capsule Take 150 mg by mouth at bedtime.    09/04/2018 at Unknown time  . vitamin B-12 (CYANOCOBALAMIN) 1000 MCG tablet Take 1,000 mcg by mouth daily.    09/05/2018 at Unknown time  . predniSONE  (STERAPRED UNI-PAK 21 TAB) 10 MG (21) TBPK tablet Taper by 10 mg daily (Patient not taking: Reported on 09/05/2018) 21 tablet 0 Not Taking at Unknown time   Scheduled: .  stroke: mapping our early stages of recovery book   Does not apply Once  . amLODipine  10 mg Oral Daily  . atorvastatin  40 mg Oral QHS  . butamben-tetracaine-benzocaine      . enoxaparin (LOVENOX) injection  40 mg Subcutaneous Q24H  . fentaNYL      . fluticasone furoate-vilanterol  1 puff Inhalation Daily  . ipratropium-albuterol      . lidocaine      . mouth rinse  15 mL Mouth Rinse BID  . metoprolol tartrate  12.5 mg Oral BID  . midazolam      . oxybutynin  5 mg Oral TID  . sodium chloride flush      . umeclidinium bromide  1 puff Inhalation Daily  . venlafaxine XR  150 mg Oral QHS    Assessment: 68 y.o. female with past medical history of COPD on home oxygen, hyperlipidemia, hypertension, diabetes, anxiety, osteoporosis, urinary incontinence and asthma presenting to the ED with syncopal episode and right leg weakness. 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 Etiology concerning for embolic etiology.  MRA head showed no large vessel occlusion, stenosis or aneurysm. Ultrasound carotids bilateral shows no significant hemodynamically stenosis.    Echocardiogram did not show cardiac source of emboli.  Follow-up TEE showed no evidence of thrombus in the atrial cavity or appendage, PFO could not be excluded.  Medium sized atrial septal aneurysm noted predominantly within the left atrial cavity otherwise no thrombus was identified within the aneurysm.  Hemoglobin A1c 6.7, LDL 68.  Recommendations: 1. Continue medical management with Aspirin 81 mg/day with intensive management of vascular risk factor to keep systolic BP (SBP) <140 mm Hg (941 mm Hg if diabetic). 2. Statin with goal low density lipoprotein (LDL) <70 mg/dl 3.  Recommend outpatient evaluation for prolonged cardiac monitoring given her embolic stroke and negative TEE  This patient was staffed with Dr. Jonna Munro who personally evaluated patient, reviewed documentation and agreed with assessment and plan of care as above.  Webb Silversmith, DNP, FNP-BC Board certified Nurse Practitioner Neurology Department     LOS: 3 days   09/09/2018  2:29 PM

## 2018-09-09 NOTE — Plan of Care (Signed)

## 2018-09-09 NOTE — Progress Notes (Signed)
Occupational Therapy Treatment Patient Details Name: Audrey Sexton MRN: [id number] DOB: 1950/10/04 Today's Date: 09/09/2018    History of present illness Audrey Sexton  is a 68 y.o. female who presents with chief complaint RLE weakness.  Patient states that she went to use her bathroom and woke up on the floor.  She does not recall what happened or how long she was unconscious.  When she woke up she had weakness in her right lower extremity, this is new for her. Storke work up completed in ED; MRI showed Small acute infarct in the left corona radiata, Tiny acute or subacute infarct in the right putamen. PMH also includes asthma (3L o2 in the home), COPD, HTN, and acute respiratory failure.    OT comments  Audrey Sexton seen today for OT/PT co-tx. Upon arrival, pt in bed and agreeable to tx. Audrey Sexton stated she was still feeling somewhat groggy from a recent procedure, but otherwise was awake and alert and participated throughout the session. Audrey Sexton is showing good improvement from her initial evaluation in the ED. She is +2 assist for functional mobility and continues to have difficulty with AROM of her RLE. She was able to utilize her RUE for AROM and a functional grooming task. Therapist provided set-up/supervision assist for seated-self-care task. Pt able to complete face washing task (see below for details). Will continue to follow POC as written. DC recommendation remains appropriate.     Follow Up Recommendations  SNF    Equipment Recommendations       Recommendations for Other Services      Precautions / Restrictions         Mobility Bed Mobility                  Transfers                      Balance                                           ADL either performed or assessed with clinical judgement   ADL Overall ADL's : Needs assistance/impaired     Grooming: Wash/dry face;Set up;Cueing for  sequencing;Sitting;Supervision/safety;Cueing for compensatory techniques Grooming Details (indicate cue type and reason): Grooming ADL assessed on this date. Pt with good bilateral coordination to complete face washing while seated with set-up and supervision for safety. Pt able to use BUE to hold and ring out wash cloth. Pt appropriate throughout task, occasional verbal cues required to attend to task, pt at times easily distracted by environmental stimuli. VC also required for PLB throughout session.                                General ADL Comments: Pt with decreased 02 sats with mobility on this date. At rest 02 in mid-low 90's. EOB 02 decreased to 86. Pt encouraged to utlize PLB and increased 02 from 3L to 4L during mobility. HR in low 100's at rest and increasing to 113-116 with mobility. RN notified.      Vision       Ambulance person  Exercises     Shoulder Instructions       General Comments      Pertinent Vitals/ Pain          Home Living                                          Prior Functioning/Environment              Frequency  Min 2X/week        Progress Toward Goals  OT Goals(current goals can now be found in the care plan section)  Progress towards OT goals: Progressing toward goals  Acute Rehab OT Goals Patient Stated Goal: to return to PLOF OT Goal Formulation: With patient Time For Goal Achievement: 09/20/18 Potential to Achieve Goals: Good  Plan Discharge plan remains appropriate    Co-evaluation    PT/OT/SLP Co-Evaluation/Treatment: Yes Reason for Co-Treatment: Complexity of the patient's impairments (multi-system involvement);To address functional/ADL transfers;For patient/therapist safety PT goals addressed during session: Mobility/safety with mobility OT goals addressed during session: ADL's and self-care       AM-PAC OT "6 Clicks" Daily Activity     Outcome Measure   Help from another person eating meals?: A Little Help from another person taking care of personal grooming?: A Little Help from another person toileting, which includes using toliet, bedpan, or urinal?: A Lot Help from another person bathing (including washing, rinsing, drying)?: A Lot Help from another person to put on and taking off regular upper body clothing?: A Little Help from another person to put on and taking off regular lower body clothing?: A Lot 6 Click Score: 15    End of Session Equipment Utilized During Treatment: Gait belt;Oxygen  OT Visit Diagnosis: Unsteadiness on feet (R26.81);Other abnormalities of gait and mobility (R26.89);History of falling (Z91.81);Muscle weakness (generalized) (M62.81);Hemiplegia and hemiparesis Hemiplegia - Right/Left: Right Hemiplegia - dominant/non-dominant: Dominant Hemiplegia - caused by: Cerebral infarction   Activity Tolerance Patient tolerated treatment well;No increased pain   Patient Left with call bell/phone within reach;with chair alarm set;in chair;with nursing/sitter in room(RN called to room to replace external cath. )   Nurse Communication Other (comment);Mobility status(Nursing notified of need for external catheter replacement and use of increased 02 with mobiltiy. )        Time: 0865-7846 OT Time Calculation (min): 28 min  Charges: OT General Charges $OT Visit: 1 Visit OT Treatments $Self Care/Home Management : 8-22 mins  Audrey Sexton, OTR/L 09/09/18, 2:31 PM

## 2018-09-09 NOTE — Care Management Important Message (Signed)
Important Message  Patient Details  Name: Audrey Sexton MRN: [id number] Date of Birth: Dec 27, 1950   Medicare Important Message Given:  Yes    Olegario MessierKathy A Janaia Kozel 09/09/2018, 11:42 AM

## 2018-09-09 NOTE — Sedation Documentation (Signed)
Total sedation: Versed 3 mg IV, Fentanyl 50 mcg IV, viscous lidocaine gargle and swallow, Cetacaine spray x4. Pt. Tolerated TEE well.

## 2018-09-09 NOTE — Progress Notes (Signed)
OT Cancellation Note  Patient Details Name: RUQAYYA THEVENIN MRN: [id number] DOB: [birth year]   Cancelled Treatment:    Reason Eval/Treat Not Completed: Other (comment). Chart reviewed. Pt had ECHO TEE performed this am. Will re-attempt OT tx this afternoon as pt is medically appropriate and able to participate.   Richrd Prime, MPH, MS, OTR/L ascom [phone] 09/09/18, 11:42 AM

## 2018-09-09 NOTE — Progress Notes (Signed)
*  PRELIMINARY RESULTS* Echocardiogram Echocardiogram Transesophageal has been performed.  Audrey Sexton 09/09/2018, 10:02 AM

## 2018-09-09 NOTE — Sedation Documentation (Signed)
Bubble study performed during TEE.   

## 2018-09-09 NOTE — Progress Notes (Addendum)
Physical Therapy Treatment Patient Details Name: Audrey Sexton MRN: [id number] DOB: 05/21/1951 Today's Date: 09/09/2018    History of Present Illness Audrey Sexton  is a 68 y.o. female who presents with chief complaint RLE weakness.  Patient states that she went to use her bathroom and woke up on the floor.  She does not recall what happened or how long she was unconscious.  When she woke up she had weakness in her right lower extremity, this is new for her. Storke work up completed in ED; MRI showed Small acute infarct in the left corona radiata, Tiny acute or subacute infarct in the right putamen. PMH also includes asthma (3L o2 in the home), COPD, HTN, and acute respiratory failure.     PT Comments    Patient alert and in bed, agreeable to PT and OT this session, no complaints of pain on 3L of O2 via [place]. Pt with improved RUE ROM compared to initial evaluation, as well as hip abduction and quad contraction with quad sets, though hip abduction/adduction and hip flex/knee flex still significantly limited. Pt mobilized to EOB with minAx1 for RLE management, verbal/visual cues to maximize patient engagement. Able to sit and scoot to EOB with supervision/CGA. Sitting EOB spO2 to 86%, moved up to 4L for further mobility. Stand pivot to chair with modAx2. Pt with improved hip extension with increased shoulder depression (elbows in PT/OT hands), able to initiate very small/shuffling steps towards chair, difficulty with RLE foot clearance, minAx1 from PT. PT up in chair for OT activities, nursing tech at bedside at end of session. Overall the patient would benefit from further skilled PT to continue to progress towards goals to maximize independence and mobility. Recommendation is SNF.      Follow Up Recommendations  SNF     Equipment Recommendations  Other (comment)(TBD at next venue of care)    Recommendations for Other Services OT consult     Precautions / Restrictions  Precautions Precautions: Fall Restrictions Weight Bearing Restrictions: No    Mobility  Bed Mobility Overal bed mobility: Needs Assistance Bed Mobility: Supine to Sit     Supine to sit: Min assist;+2 for safety/equipment;HOB elevated        Transfers Overall transfer level: Needs assistance Equipment used: 2 person hand held assist Transfers: Sit to/from [employer] Sit to Stand: Mod assist;+2 physical assistance Stand pivot transfers: Mod assist;+2 physical assistance       General transfer comment: With support under bilateral elbows, pt with improved hip extension and ability to utilize LEs. Small shuffling steps to chair noted.  Ambulation/Gait             General Gait Details: unable to intiate   Stairs             Wheelchair Mobility    Modified Rankin (Stroke Patients Only) Modified Rankin (Stroke Patients Only) Pre-Morbid Rankin Score: No symptoms Modified Rankin: Severe disability     Balance Overall balance assessment: Needs assistance   Sitting balance-Leahy Scale: Fair     Standing balance support: Bilateral upper extremity supported Standing balance-Leahy Scale: Zero                              Cognition Arousal/Alertness: Awake/alert Behavior During Therapy: WFL for tasks assessed/performed Overall Cognitive Status: Within Functional Limits for tasks assessed  General Comments: Engaged and interactive.       Exercises Other Exercises Other Exercises: heel slides x10 on RLE AAROM, quad sets AROM x10 on RLE, hip abduction x10 on RLE AAROM    General Comments        Pertinent Vitals/Pain Pain Assessment: No/denies pain(will complain of R shoulder pain intermittently if it is not supported)    Home Living                      Prior Function            PT Goals (current goals can now be found in the care plan section) Acute Rehab PT  Goals Patient Stated Goal: to return to PLOF Progress towards PT goals: Progressing toward goals    Frequency    7X/week      PT Plan Current plan remains appropriate    Co-evaluation PT/OT/SLP Co-Evaluation/Treatment: Yes Reason for Co-Treatment: Complexity of the patient's impairments (multi-system involvement);To address functional/ADL transfers;For patient/therapist safety PT goals addressed during session: Mobility/safety with mobility OT goals addressed during session: ADL's and self-care      AM-PAC PT "6 Clicks" Mobility   Outcome Measure  Help needed turning from your back to your side while in a flat bed without using bedrails?: A Little Help needed moving from lying on your back to sitting on the side of a flat bed without using bedrails?: A Little Help needed moving to and from a bed to a chair (including a wheelchair)?: A Lot Help needed standing up from a chair using your arms (e.g., wheelchair or bedside chair)?: A Lot Help needed to walk in hospital room?: Total Help needed climbing 3-5 steps with a railing? : Total 6 Click Score: 12    End of Session Equipment Utilized During Treatment: Gait belt;Oxygen;Other (comment)(4L with mobility, RN notified) Activity Tolerance: Patient tolerated treatment well Patient left: with call bell/phone within reach;with nursing/sitter in room;in chair;with chair alarm set Nurse Communication: Mobility status PT Visit Diagnosis: Unsteadiness on feet (R26.81);Difficulty in walking, not elsewhere classified (R26.2);Hemiplegia and hemiparesis;Muscle weakness (generalized) (M62.81) Hemiplegia - Right/Left: Right Hemiplegia - dominant/non-dominant: Dominant Hemiplegia - caused by: Cerebral infarction     Time: 1333-1401 PT Time Calculation (min) (ACUTE ONLY): 28 min  Charges:  $Therapeutic Activity: 8-22 mins                     Olga Coaster PT, DPT 2:41 PM,09/09/18 [phone]

## 2018-09-09 NOTE — Progress Notes (Addendum)
[facility] [place] [facility]      PATIENT NAME: Audrey Sexton    MR#:  [id number]  DATE OF BIRTH:  [birth year]  SUBJECTIVE:   No acute events overnight. TEE is negative for intracardiac shunt or thrombus.  Still has dense RLE weakness.   REVIEW OF SYSTEMS:    Review of Systems  Constitutional: Negative for chills and fever.  HENT: Negative for congestion and tinnitus.   Eyes: Negative for blurred vision and double vision.  Respiratory: Negative for cough, shortness of breath and wheezing.   Cardiovascular: Negative for chest pain, orthopnea and PND.  Gastrointestinal: Negative for abdominal pain, diarrhea, nausea and vomiting.  Genitourinary: Negative for dysuria and hematuria.  Neurological: Positive for weakness (RLE). Negative for dizziness, sensory change and focal weakness.  All other systems reviewed and are negative.   Nutrition: Soft diet Tolerating Diet: Yes Tolerating PT: Eval noted.    DRUG ALLERGIES:   Allergies  Allergen Reactions  . Percocet [Oxycodone-Acetaminophen] Nausea And Vomiting  . Vicodin [Hydrocodone-Acetaminophen] Nausea And Vomiting  . Penicillins Hives    Has patient had a PCN reaction causing immediate rash, facial/tongue/throat swelling, SOB or lightheadedness with hypotension: no Has patient had a PCN reaction causing severe rash involving mucus membranes or skin necrosis: no Has patient had a PCN reaction that required hospitalization  no Has patient had a PCN reaction occurring within the last 10 years: no If all of the above answers are "NO", then may proceed with Cephalosporin use.     VITALS:  Blood pressure 135/72, pulse 99, temperature 97.7 F (36.5 C), temperature source Oral, resp. rate 18, height 5\' 4"  (1.626 m), weight 90.4 kg, SpO2 92 %.  PHYSICAL EXAMINATION:   Physical Exam  GENERAL:  68 y.o.-year-old patient lying in bed in no acute distress.  EYES: Pupils equal, round, reactive to light and  accommodation. No scleral icterus. Extraocular muscles intact.  HEENT: Head atraumatic, normocephalic. Oropharynx and nasopharynx clear.  NECK:  Supple, no jugular venous distention. No thyroid enlargement, no tenderness.  LUNGS: Normal breath sounds bilaterally, no wheezing, rales, rhonchi. No use of accessory muscles of respiration.  CARDIOVASCULAR: S1, S2 normal. No murmurs, rubs, or gallops.  ABDOMEN: Soft, nontender, nondistended. Bowel sounds present. No organomegaly or mass.  EXTREMITIES: No cyanosis, clubbing or edema b/l.    NEUROLOGIC: Cranial nerves II through XII are intact.  Dense right lower extremity weakness. PSYCHIATRIC: The patient is alert and oriented x 3.  SKIN: No obvious rash, lesion, or ulcer.    LABORATORY PANEL:   CBC Recent Labs  Lab 09/06/18 0529  WBC 10.2  HGB 13.7  HCT 43.7  PLT 228   ------------------------------------------------------------------------------------------------------------------  Chemistries  Recent Labs  Lab 09/05/18 1623 09/06/18 0529  NA 142  --   K 4.0  --   CL 102  --   CO2 32  --   GLUCOSE 152*  --   BUN 15  --   CREATININE 1.16* 1.09*  CALCIUM 9.5  --    ------------------------------------------------------------------------------------------------------------------  Cardiac Enzymes Recent Labs  Lab 09/05/18 1948  TROPONINI <0.03   ------------------------------------------------------------------------------------------------------------------  RADIOLOGY:  No results found.   ASSESSMENT AND PLAN:   68 year old female with past medical history of hypertension hyperlipidemia COPD, asthma, who presented to the hospital due to right lower extremity weakness/fall and noted to have an acute stroke.  1.  Acute CVA- patient presented to the hospital due to right lower extremity weakness, patient's MRI was positive for  acute infarct in the left corona radiata, Tiny acute or subacute infarct in the right  putamen. -Seen by neurology, continue aspirin, atorvastatin.  Seen by physical therapy and Occupational Therapy and they recommend short-term rehab and social work made aware. - Carotid duplex showing no hemodynamically significant carotid artery stenosis, no intracranial stenosis on MRI of the brain, had TEE today which was negative for intracardiac thrombus.  Stable to be discharged on oral antiplatelet therapy.  2.  Essential hypertension-continue metoprolol, Norvasc.  3.  Urinary incontinence-continue Ditropan.    4.  COPD-no acute exacerbation.  Continue Incruse/Ellipta, Breo Ellipta.   5. Depression - cont. Effexor.     All the records are reviewed and case discussed with Care Management/Social Worker. Management plans discussed with the patient, family and they are in agreement.  CODE STATUS: Full code  DVT Prophylaxis: Lovenox  TOTAL TIME TAKING CARE OF THIS PATIENT: 30 minutes.   POSSIBLE D/C IN 1-2 DAYS, DEPENDING ON CLINICAL CONDITION.   Houston Siren M.D on 09/09/2018 at 1:13 PM  Between 7am to 6pm - Pager - [phone]  After 6pm go to [email or web address] - Social research officer, government  [facility] [place] [facility]  Office  [phone]  CC: Primary care physician; Oswaldo Conroy, MD

## 2018-09-09 NOTE — Procedures (Signed)
   TRANSESOPHAGEAL ECHOCARDIOGRAM   NAME:  Audrey Sexton   MRN: [id number] DOB:  08-12-1951   ADMIT DATE: 09/05/2018  INDICATIONS:   PROCEDURE:   Informed consent was obtained prior to the procedure. The risks, benefits and alternatives for the procedure were discussed and the patient comprehended these risks.  Risks include, but are not limited to, cough, sore throat, vomiting, nausea, somnolence, esophageal and stomach trauma or perforation, bleeding, low blood pressure, aspiration, pneumonia, infection, trauma to the teeth and death.    After a procedural time-out, the patient was given 3 mg versed and 50 mcg fentanyl to achieve moderate sedation for 10 min.  The oropharynx was anesthetized 3 cc of topical 1% viscous lidocaine.  The transesophageal probe was inserted in the esophagus and stomach without difficulty and multiple views were obtained. I was present for the entire procedure.    COMPLICATIONS:    There were no immediate complications.  FINDINGS:  LEFT VENTRICLE: EF = 55%. No regional wall motion abnormalities.  RIGHT VENTRICLE: Normal size and function.   LEFT ATRIUM: Normal appendage. No thrombus  LEFT ATRIAL APPENDAGE: No thrombus.   RIGHT ATRIUM: Normal.  AORTIC VALVE:  Trileaflet. No vegetations or thrombi. Trivial ai  MITRAL VALVE:    Normal. Trivial to mild mr. No vegetations or thrombus  TRICUSPID VALVE: Normal. Mild tr. No thrombus or vegetations.   PULMONIC VALVE: Grossly normal.  INTERATRIAL SEPTUM:  Agitated saline contrast was used.  Few bubbles cross from right to left. Aneurysmal intra atrial septum. No obvious vsd visualized.   PERICARDIUM: No effusion  DESCENDING AORTA: No significant lesions.    CONCLUSION:  TEE completed.  No thrombus or vegetations noted in the left atrium left ventricle or valves.  Agitated saline contrast showed a few bubbles crossing right to left.  No obvious VSD noted.

## 2018-09-09 NOTE — Clinical Social Work Note (Signed)
CSW gave bed offers to patient and CMS.gov ratings. Patient states that she needs to speak with her daughter before making a decision. CSW will follow up with patient to see what their decision is.   Ruthe Mannan MSW, [address] [phone]

## 2018-09-10 MED ORDER — ASPIRIN EC 81 MG PO TBEC
81.0000 mg | DELAYED_RELEASE_TABLET | Freq: Every day | ORAL | Status: DC
  Administered 2018-09-10 – 2018-09-11 (×2): 81 mg via ORAL
  Filled 2018-09-10 (×2): qty 1

## 2018-09-10 NOTE — Clinical Social Work Note (Signed)
CSW spoke with patient this morning to determine bed choice. Patient states that she has chosen [facility]. CSW notified Tina at [facility] of bed acceptance. Inetta Fermo will begin [facility] authorization. CSW will continue to follow for discharge planning.   Ruthe Mannan MSW, [address] [phone]

## 2018-09-10 NOTE — Progress Notes (Signed)
[facility] [place] [facility]      PATIENT NAME: Audrey Sexton    MR#:  [id number]  DATE OF BIRTH:  [birth year]  SUBJECTIVE:   No acute events overnight, still having some right lower extremity weakness.  Awaiting insurance approval to discharge to short-term rehab.  REVIEW OF SYSTEMS:    Review of Systems  Constitutional: Negative for chills and fever.  HENT: Negative for congestion and tinnitus.   Eyes: Negative for blurred vision and double vision.  Respiratory: Negative for cough, shortness of breath and wheezing.   Cardiovascular: Negative for chest pain, orthopnea and PND.  Gastrointestinal: Negative for abdominal pain, diarrhea, nausea and vomiting.  Genitourinary: Negative for dysuria and hematuria.  Neurological: Positive for weakness (RLE). Negative for dizziness, sensory change and focal weakness.  All other systems reviewed and are negative.   Nutrition: Soft diet Tolerating Diet: Yes Tolerating PT: Eval noted.    DRUG ALLERGIES:   Allergies  Allergen Reactions  . Percocet [Oxycodone-Acetaminophen] Nausea And Vomiting  . Vicodin [Hydrocodone-Acetaminophen] Nausea And Vomiting  . Penicillins Hives    Has patient had a PCN reaction causing immediate rash, facial/tongue/throat swelling, SOB or lightheadedness with hypotension: no Has patient had a PCN reaction causing severe rash involving mucus membranes or skin necrosis: no Has patient had a PCN reaction that required hospitalization  no Has patient had a PCN reaction occurring within the last 10 years: no If all of the above answers are "NO", then may proceed with Cephalosporin use.     VITALS:  Blood pressure 115/82, pulse 93, temperature 98.4 F (36.9 C), temperature source Oral, resp. rate 19, height 5\' 4"  (1.626 m), weight 90.4 kg, SpO2 93 %.  PHYSICAL EXAMINATION:   Physical Exam  GENERAL:  68 y.o.-year-old patient lying in bed in no acute distress.  EYES: Pupils equal,  round, reactive to light and accommodation. No scleral icterus. Extraocular muscles intact.  HEENT: Head atraumatic, normocephalic. Oropharynx and nasopharynx clear.  NECK:  Supple, no jugular venous distention. No thyroid enlargement, no tenderness.  LUNGS: Normal breath sounds bilaterally, no wheezing, rales, rhonchi. No use of accessory muscles of respiration.  CARDIOVASCULAR: S1, S2 normal. No murmurs, rubs, or gallops.  ABDOMEN: Soft, nontender, nondistended. Bowel sounds present. No organomegaly or mass.  EXTREMITIES: No cyanosis, clubbing or edema b/l.    NEUROLOGIC: Cranial nerves II through XII are intact.  Dense right lower extremity weakness. PSYCHIATRIC: The patient is alert and oriented x 3.  SKIN: No obvious rash, lesion, or ulcer.    LABORATORY PANEL:   CBC Recent Labs  Lab 09/06/18 0529  WBC 10.2  HGB 13.7  HCT 43.7  PLT 228   ------------------------------------------------------------------------------------------------------------------  Chemistries  Recent Labs  Lab 09/05/18 1623 09/06/18 0529  NA 142  --   K 4.0  --   CL 102  --   CO2 32  --   GLUCOSE 152*  --   BUN 15  --   CREATININE 1.16* 1.09*  CALCIUM 9.5  --    ------------------------------------------------------------------------------------------------------------------  Cardiac Enzymes Recent Labs  Lab 09/05/18 1948  TROPONINI <0.03   ------------------------------------------------------------------------------------------------------------------  RADIOLOGY:  No results found.   ASSESSMENT AND PLAN:   68 year old female with past medical history of hypertension hyperlipidemia COPD, asthma, who presented to the hospital due to right lower extremity weakness/fall and noted to have an acute stroke.  1.  Acute CVA- patient presented to the hospital due to right lower extremity weakness, patient's MRI was positive  for acute infarct in the left corona radiata, Tiny acute or subacute  infarct in the right putamen. -Seen by neurology, continue aspirin, atorvastatin.  Seen by physical therapy and Occupational Therapy and they recommend short-term rehab and pt. Is awaiting insurance authorization. - Carotid duplex showing no hemodynamically significant carotid artery stenosis, no intracranial stenosis on MRI of the brain, had TEE yesterday which was negative for intracardiac thrombus.  Stable to be discharged on oral antiplatelet therapy. - As per neurology will need to arrange outpatient follow-up with cardiology for long-term monitoring including event/Loop monitor.  2.  Essential hypertension-continue metoprolol, Norvasc. - BP stable.   3.  Urinary incontinence-continue Ditropan.    4.  COPD-no acute exacerbation.  Continue Incruse/Ellipta, Breo Ellipta.   5. Depression - cont. Effexor.   Discharge to SNF/STR once insurance authorization received.   All the records are reviewed and case discussed with Care Management/Social Worker. Management plans discussed with the patient, family and they are in agreement.  CODE STATUS: Full code  DVT Prophylaxis: Lovenox  TOTAL TIME TAKING CARE OF THIS PATIENT: 30 minutes.   POSSIBLE D/C IN 1-2 DAYS, DEPENDING ON CLINICAL CONDITION.   Houston Siren M.D on 09/10/2018 at 2:02 PM  Between 7am to 6pm - Pager - [phone]  After 6pm go to [email or web address] - Social research officer, government  [facility] [place] [facility]  Office  [phone]  CC: Primary care physician; Oswaldo Conroy, MD

## 2018-09-10 NOTE — Progress Notes (Signed)
Physical Therapy Treatment Patient Details Name: Audrey Sexton MRN: [id number] DOB: [birth year] Today's Date: 09/10/2018    History of Present Illness Audrey Sexton  is a 68 y.o. female who presents with chief complaint RLE weakness.  Patient states that she went to use her bathroom and woke up on the floor.  She does not recall what happened or how long she was unconscious.  When she woke up she had weakness in her right lower extremity, this is new for her. Storke work up completed in ED; MRI showed Small acute infarct in the left corona radiata, Tiny acute or subacute infarct in the right putamen. PMH also includes asthma (3L o2 in the home), COPD, HTN, and acute respiratory failure.     PT Comments    Pt demonstrated progress in R LE strength and mobility today, being better able to initiate hip abduction/adduction and perform STS and pivot transfers with less assistance.  Pt did desat to lower 80% range and needed to increase O2 level to 4L.  RN notified and nurse tech in to assist with pivot transfer.  Pt became anxious during transfer and required directed pursed lip breathing exercises for approximately 3 min to recover to 90% range.  Pt still with difficulty in initiating step with R LE during transfer.  She demonstrated understanding of importance of there ex and was very willing to work with PT.  Pt will continue to benefit from skilled PT with focus on strength, safe functional mobility and return to PLOF.  SNF is still an appropriate discharge recommendation.  Follow Up Recommendations  SNF     Equipment Recommendations  Other (comment)(TBD at next venue of care)    Recommendations for Other Services OT consult     Precautions / Restrictions Precautions Precautions: Fall Restrictions Weight Bearing Restrictions: No    Mobility  Bed Mobility Overal bed mobility: Needs Assistance Bed Mobility: Supine to Sit     Supine to sit: Min assist;HOB elevated     General bed  mobility comments: Pt able to initiate LE movement without PT assist today; still very slow to move but able to sit at EOB and scoot forward on her own.  Transfers Overall transfer level: Needs assistance Equipment used: 2 person hand held assist Transfers: Sit to/from [employer] CorporationStand;Stand Pivot Transfers Sit to Stand: Mod assist;+2 physical assistance Stand pivot transfers: Mod assist;+2 physical assistance       General transfer comment: Pt better able to rise from bedside on her own today, still with +2 assist for safety.  Pt did still have difficulty with initiating swing phase of R LE.  Shuffling steps noted.  Ambulation/Gait             General Gait Details: unable to intiate   Stairs             Wheelchair Mobility    Modified Rankin (Stroke Patients Only) Modified Rankin (Stroke Patients Only) Pre-Morbid Rankin Score: No symptoms Modified Rankin: Severe disability     Balance Overall balance assessment: Needs assistance Sitting-balance support: Feet unsupported Sitting balance-Leahy Scale: Fair     Standing balance support: Bilateral upper extremity supported Standing balance-Leahy Scale: Poor                              Cognition Arousal/Alertness: Awake/alert Behavior During Therapy: WFL for tasks assessed/performed Overall Cognitive Status: Within Functional Limits for tasks assessed  General Comments: Engaged and interactive.       Exercises General Exercises - Lower Extremity Ankle Circles/Pumps: 20 reps;Strengthening;Both;Supine Heel Slides: AAROM;Right;10 reps;Supine Hip ABduction/ADduction: Strengthening;Right;10 reps;Supine(pillow squeezes for hip adduction, pt able to perform hip abduction without PT assist today.)    General Comments        Pertinent Vitals/Pain      Home Living                      Prior Function            PT Goals (current goals can now be  found in the care plan section) Acute Rehab PT Goals Patient Stated Goal: to return to PLOF PT Goal Formulation: With patient Time For Goal Achievement: 09/20/18 Progress towards PT goals: Progressing toward goals    Frequency    7X/week      PT Plan Current plan remains appropriate    Co-evaluation   Reason for Co-Treatment: Complexity of the patient's impairments (multi-system involvement);For patient/therapist safety;To address functional/ADL transfers PT goals addressed during session: Mobility/safety with mobility        AM-PAC PT "6 Clicks" Mobility   Outcome Measure  Help needed turning from your back to your side while in a flat bed without using bedrails?: A Little Help needed moving from lying on your back to sitting on the side of a flat bed without using bedrails?: A Little Help needed moving to and from a bed to a chair (including a wheelchair)?: A Lot Help needed standing up from a chair using your arms (e.g., wheelchair or bedside chair)?: A Lot Help needed to walk in hospital room?: A Lot Help needed climbing 3-5 steps with a railing? : Total 6 Click Score: 13    End of Session Equipment Utilized During Treatment: Gait belt;Oxygen;Other (comment)(4L with mobility, RN notified) Activity Tolerance: Patient tolerated treatment well Patient left: with call bell/phone within reach;with nursing/sitter in room;in chair;with chair alarm set Nurse Communication: Mobility status(Pt O2 desat on 4L during transfer.) PT Visit Diagnosis: Unsteadiness on feet (R26.81);Difficulty in walking, not elsewhere classified (R26.2);Hemiplegia and hemiparesis;Muscle weakness (generalized) (M62.81) Hemiplegia - Right/Left: Right Hemiplegia - dominant/non-dominant: Dominant Hemiplegia - caused by: Cerebral infarction     Time: 1350-1418 PT Time Calculation (min) (ACUTE ONLY): 28 min  Charges:  $Therapeutic Exercise: 8-22 mins $Therapeutic Activity: 8-22 mins                      Glenetta Hew, PT, DPT    Glenetta Hew 09/10/2018, 2:31 PM

## 2018-09-10 NOTE — Plan of Care (Signed)
Problem: Education: Goal: Knowledge of disease or condition will improve Outcome: Progressing Goal: Knowledge of secondary prevention will improve Outcome: Progressing Goal: Knowledge of patient specific risk factors addressed and post discharge goals established will improve Outcome: Progressing   Problem: Coping: Goal: Will identify appropriate support needs Outcome: Progressing   Problem: Health Behavior/Discharge Planning: Goal: Ability to manage health-related needs will improve Outcome: Progressing   Problem: Nutrition: Goal: Risk of aspiration will decrease Outcome: Progressing   Problem: Education: Goal: Knowledge of General Education information will improve Description Including pain rating scale, medication(s)/side effects and non-pharmacologic comfort measures 09/10/2018 0225 by Orvan Seenross, Malissie Musgrave D, RN Outcome: Progressing 09/10/2018 0219 by Orvan Seenross, Lynzee Lindquist D, RN Outcome: Progressing   Problem: Health Behavior/Discharge Planning: Goal: Ability to manage health-related needs will improve 09/10/2018 0225 by Orvan Seenross, Daisi Kentner D, RN Outcome: Progressing 09/10/2018 0219 by Orvan Seenross, Loyola Santino D, RN Outcome: Not Progressing   Problem: Clinical Measurements: Goal: Ability to maintain clinical measurements within normal limits will improve 09/10/2018 0225 by Orvan Seenross, Keyundra Fant D, RN Outcome: Progressing 09/10/2018 0219 by Orvan Seenross, Lesa Vandall D, RN Outcome: Progressing Goal: Will remain free from infection 09/10/2018 0225 by Orvan Seenross, Katherine Syme D, RN Outcome: Progressing 09/10/2018 0219 by Orvan Seenross, Krystal Teachey D, RN Outcome: Progressing Goal: Diagnostic test results will improve 09/10/2018 0225 by Orvan Seenross, Vasco Chong D, RN Outcome: Progressing 09/10/2018 0219 by Orvan Seenross, Tessie Ordaz D, RN Outcome: Progressing Goal: Respiratory complications will improve 09/10/2018 0225 by Orvan Seenross, Ahaan Zobrist D, RN Outcome: Progressing 09/10/2018 0219 by Orvan Seenross, Lonnetta Kniskern D, RN Outcome: Progressing Goal: Cardiovascular complication will be  avoided 09/10/2018 0225 by Orvan Seenross, Charmon Thorson D, RN Outcome: Progressing 09/10/2018 0219 by Orvan Seenross, Cass Vandermeulen D, RN Outcome: Progressing   Problem: Elimination: Goal: Will not experience complications related to bowel motility 09/10/2018 0225 by Orvan Seenross, Dorin Stooksbury D, RN Outcome: Progressing 09/10/2018 0219 by Orvan Seenross, Afsana Liera D, RN Outcome: Progressing Goal: Will not experience complications related to urinary retention 09/10/2018 0225 by Orvan Seenross, Nardos Putnam D, RN Outcome: Progressing 09/10/2018 0219 by Orvan Seenross, Mizani Dilday D, RN Outcome: Progressing   Problem: Pain Managment: Goal: General experience of comfort will improve 09/10/2018 0225 by Orvan Seenross, Taniqua Issa D, RN Outcome: Progressing 09/10/2018 0219 by Orvan Seenross, Griffin Gerrard D, RN Outcome: Progressing   Problem: Safety: Goal: Ability to remain free from injury will improve 09/10/2018 0225 by Orvan Seenross, Omer Monter D, RN Outcome: Progressing 09/10/2018 0219 by Kennyth Arnoldross, Leniyah Martell D, RN Outcome: Progressing   Problem: Skin Integrity: Goal: Risk for impaired skin integrity will decrease 09/10/2018 0225 by Orvan Seenross, Imer Foxworth D, RN Outcome: Progressing 09/10/2018 0219 by Orvan Seenross, Tamea Bai D, RN Outcome: Progressing   Problem: Self-Care: Goal: Ability to participate in self-care as condition permits will improve Outcome: Progressing Goal: Verbalization of feelings and concerns over difficulty with self-care will improve Outcome: Progressing Goal: Ability to communicate needs accurately will improve Outcome: Progressing   Problem: Ischemic Stroke/TIA Tissue Perfusion: Goal: Complications of ischemic stroke/TIA will be minimized Outcome: Progressing   Problem: Education: Goal: Knowledge of disease or condition will improve Outcome: Progressing Goal: Knowledge of secondary prevention will improve Outcome: Progressing Goal: Knowledge of patient specific risk factors addressed and post discharge goals established will improve Outcome: Progressing   Problem: Coping: Goal: Will identify  appropriate support needs Outcome: Progressing   Problem: Health Behavior/Discharge Planning: Goal: Ability to manage health-related needs will improve Outcome: Progressing   Problem: Nutrition: Goal: Risk of aspiration will decrease Outcome: Progressing   Problem: Education: Goal: Knowledge of General Education information will improve Description Including pain rating scale, medication(s)/side effects and non-pharmacologic comfort measures 09/10/2018 0225 by Orvan Seenross, Airel Magadan D,  RN Outcome: Progressing 09/10/2018 0219 by Orvan Seen, RN Outcome: Progressing   Problem: Health Behavior/Discharge Planning: Goal: Ability to manage health-related needs will improve 09/10/2018 0225 by Orvan Seen, RN Outcome: Progressing 09/10/2018 0219 by Orvan Seen, RN Outcome: Not Progressing   Problem: Clinical Measurements: Goal: Ability to maintain clinical measurements within normal limits will improve 09/10/2018 0225 by Orvan Seen, RN Outcome: Progressing 09/10/2018 0219 by Orvan Seen, RN Outcome: Progressing Goal: Will remain free from infection 09/10/2018 0225 by Orvan Seen, RN Outcome: Progressing 09/10/2018 0219 by Orvan Seen, RN Outcome: Progressing Goal: Diagnostic test results will improve 09/10/2018 0225 by Orvan Seen, RN Outcome: Progressing 09/10/2018 0219 by Orvan Seen, RN Outcome: Progressing Goal: Respiratory complications will improve 09/10/2018 0225 by Orvan Seen, RN Outcome: Progressing 09/10/2018 0219 by Orvan Seen, RN Outcome: Progressing Goal: Cardiovascular complication will be avoided 09/10/2018 0225 by Orvan Seen, RN Outcome: Progressing 09/10/2018 0219 by Orvan Seen, RN Outcome: Progressing   Problem: Elimination: Goal: Will not experience complications related to bowel motility 09/10/2018 0225 by Orvan Seen, RN Outcome: Progressing 09/10/2018 0219 by Orvan Seen, RN Outcome: Progressing Goal:  Will not experience complications related to urinary retention 09/10/2018 0225 by Orvan Seen, RN Outcome: Progressing 09/10/2018 0219 by Orvan Seen, RN Outcome: Progressing   Problem: Pain Managment: Goal: General experience of comfort will improve 09/10/2018 0225 by Orvan Seen, RN Outcome: Progressing 09/10/2018 0219 by Orvan Seen, RN Outcome: Progressing   Problem: Safety: Goal: Ability to remain free from injury will improve 09/10/2018 0225 by Orvan Seen, RN Outcome: Progressing 09/10/2018 0219 by Kennyth Arnold D, RN Outcome: Progressing   Problem: Skin Integrity: Goal: Risk for impaired skin integrity will decrease 09/10/2018 0225 by Orvan Seen, RN Outcome: Progressing 09/10/2018 0219 by Orvan Seen, RN Outcome: Progressing   Problem: Self-Care: Goal: Ability to participate in self-care as condition permits will improve Outcome: Progressing Goal: Verbalization of feelings and concerns over difficulty with self-care will improve Outcome: Progressing Goal: Ability to communicate needs accurately will improve Outcome: Progressing   Problem: Ischemic Stroke/TIA Tissue Perfusion: Goal: Complications of ischemic stroke/TIA will be minimized Outcome: Progressing

## 2018-09-10 NOTE — Plan of Care (Signed)
  Problem: Education: Goal: Knowledge of General Education information will improve Description Including pain rating scale, medication(s)/side effects and non-pharmacologic comfort measures Outcome: Progressing   Problem: Clinical Measurements: Goal: Ability to maintain clinical measurements within normal limits will improve Outcome: Progressing Goal: Will remain free from infection Outcome: Progressing Goal: Diagnostic test results will improve Outcome: Progressing Goal: Respiratory complications will improve Outcome: Progressing Goal: Cardiovascular complication will be avoided Outcome: Progressing   Problem: Elimination: Goal: Will not experience complications related to bowel motility Outcome: Progressing Goal: Will not experience complications related to urinary retention Outcome: Progressing   Problem: Pain Managment: Goal: General experience of comfort will improve Outcome: Progressing   Problem: Safety: Goal: Ability to remain free from injury will improve Outcome: Progressing   Problem: Skin Integrity: Goal: Risk for impaired skin integrity will decrease Outcome: Progressing   

## 2018-09-10 NOTE — Plan of Care (Signed)
  Problem: Education: Goal: Knowledge of disease or condition will improve Outcome: Progressing Goal: Knowledge of secondary prevention will improve Outcome: Progressing Goal: Knowledge of patient specific risk factors addressed and post discharge goals established will improve Outcome: Progressing   Problem: Coping: Goal: Will identify appropriate support needs Outcome: Progressing   Problem: Health Behavior/Discharge Planning: Goal: Ability to manage health-related needs will improve Outcome: Progressing   Problem: Nutrition: Goal: Risk of aspiration will decrease Outcome: Progressing   Problem: Education: Goal: Knowledge of General Education information will improve Description Including pain rating scale, medication(s)/side effects and non-pharmacologic comfort measures Outcome: Progressing   Problem: Health Behavior/Discharge Planning: Goal: Ability to manage health-related needs will improve Outcome: Progressing   Problem: Clinical Measurements: Goal: Ability to maintain clinical measurements within normal limits will improve Outcome: Progressing Goal: Will remain free from infection Outcome: Progressing Goal: Diagnostic test results will improve Outcome: Progressing Goal: Respiratory complications will improve Outcome: Progressing Goal: Cardiovascular complication will be avoided Outcome: Progressing   Problem: Elimination: Goal: Will not experience complications related to bowel motility Outcome: Progressing Goal: Will not experience complications related to urinary retention Outcome: Progressing   Problem: Pain Managment: Goal: General experience of comfort will improve Outcome: Progressing   Problem: Safety: Goal: Ability to remain free from injury will improve Outcome: Progressing   Problem: Skin Integrity: Goal: Risk for impaired skin integrity will decrease Outcome: Progressing   Problem: Self-Care: Goal: Ability to participate in self-care as  condition permits will improve Outcome: Progressing Goal: Verbalization of feelings and concerns over difficulty with self-care will improve Outcome: Progressing Goal: Ability to communicate needs accurately will improve Outcome: Progressing   Problem: Ischemic Stroke/TIA Tissue Perfusion: Goal: Complications of ischemic stroke/TIA will be minimized Outcome: Progressing

## 2018-09-11 ENCOUNTER — Encounter: Payer: Self-pay | Admitting: Cardiology

## 2018-09-11 LAB — BASIC METABOLIC PANEL
Anion gap: 7 (ref 5–15)
BUN: 29 mg/dL — ABNORMAL HIGH (ref 8–23)
CO2: 29 mmol/L (ref 22–32)
Calcium: 9.1 mg/dL (ref 8.9–10.3)
Chloride: 106 mmol/L (ref 98–111)
Creatinine, Ser: 0.94 mg/dL (ref 0.44–1.00)
GFR calc Af Amer: >60 mL/min (ref >60)
GFR calc non Af Amer: >60 mL/min (ref >60)
Glucose, Bld: 110 mg/dL — ABNORMAL HIGH (ref 70–99)
Potassium: 4.1 mmol/L (ref 3.5–5.1)
Sodium: 142 mmol/L (ref 135–145)

## 2018-09-11 LAB — CBC
HCT: 40.3 % (ref 36.0–46.0)
HEMOGLOBIN: 12.3 g/dL (ref 12.0–15.0)
MCH: 28.5 pg (ref 26.0–34.0)
MCHC: 30.5 g/dL (ref 30.0–36.0)
MCV: 93.5 fL (ref 80.0–100.0)
Platelets: 210 10*3/uL (ref 150–400)
RBC: 4.31 MIL/uL (ref 3.87–5.11)
RDW: 13.7 % (ref 11.5–15.5)
WBC: 8.8 10*3/uL (ref 4.0–10.5)
nRBC: 0 % (ref 0.0–0.2)

## 2018-09-11 MED ORDER — LACTULOSE 10 GM/15ML PO SOLN: 30.0000 g | Freq: Two times a day (BID) | ORAL | Status: DC | PRN

## 2018-09-11 MED ORDER — DILTIAZEM HCL ER COATED BEADS 240 MG PO CP24
240.0000 mg | ORAL_CAPSULE | Freq: Every day | ORAL | Status: DC
  Filled 2018-09-11: qty 1

## 2018-09-11 MED ORDER — BISACODYL 10 MG RE SUPP
10.0000 mg | Freq: Every day | RECTAL | Status: DC | PRN
  Administered 2018-09-11: 14:00:00 10 mg via RECTAL
  Filled 2018-09-11 (×2): qty 1

## 2018-09-11 MED ORDER — ASPIRIN 81 MG PO TBEC: 81.0000 mg | DELAYED_RELEASE_TABLET | Freq: Every day | ORAL | Status: AC

## 2018-09-11 MED ORDER — FUROSEMIDE 40 MG PO TABS: 40.0000 mg | ORAL_TABLET | Freq: Every day | ORAL | Status: DC

## 2018-09-11 NOTE — Care Management Important Message (Signed)
Important Message  Patient Details  Name: Audrey Sexton MRN: [id number] Date of Birth: Apr 29, 1951   Medicare Important Message Given:  Yes    [name] A [name] 09/11/2018, 11:34 AM

## 2018-09-11 NOTE — Clinical Social Work Note (Signed)
Patient is medically ready for discharge today. CSW notified patient of discharge today. CSW also notified Inetta Fermo at [facility] of discharge today. Inetta Fermo states that she has received authorization from [facility]. Patient will be transported by EMS. RN to call report and call for transport.   Ruthe Mannan MSW, [address] [phone]

## 2018-09-11 NOTE — Progress Notes (Signed)
Report called to Konrad Dolores, LPN at [facility], patient is going to room 605

## 2018-09-11 NOTE — Progress Notes (Signed)
PT Cancellation Note  Patient Details Name: Audrey Sexton MRN: [id number] DOB: Jun 07, 1951   Cancelled Treatment:    Reason Eval/Treat Not Completed: Fatigue/lethargy limiting ability to participate   Pt in bed.  Stated she is feeling poorly today.  Pt on 4 lpm at rest.  Declines session at this time.   Danielle Dess 09/11/2018, 11:13 AM

## 2018-09-11 NOTE — Plan of Care (Signed)
  Problem: Education: Goal: Knowledge of disease or condition will improve Outcome: Progressing Goal: Knowledge of secondary prevention will improve Outcome: Progressing Goal: Knowledge of patient specific risk factors addressed and post discharge goals established will improve Outcome: Progressing   Problem: Coping: Goal: Will identify appropriate support needs Outcome: Progressing   Problem: Health Behavior/Discharge Planning: Goal: Ability to manage health-related needs will improve Outcome: Progressing   Problem: Nutrition: Goal: Risk of aspiration will decrease Outcome: Progressing   Problem: Education: Goal: Knowledge of General Education information will improve Description Including pain rating scale, medication(s)/side effects and non-pharmacologic comfort measures Outcome: Progressing   Problem: Health Behavior/Discharge Planning: Goal: Ability to manage health-related needs will improve Outcome: Progressing   Problem: Clinical Measurements: Goal: Ability to maintain clinical measurements within normal limits will improve Outcome: Progressing Goal: Will remain free from infection Outcome: Progressing Goal: Diagnostic test results will improve Outcome: Progressing Goal: Respiratory complications will improve Outcome: Progressing Goal: Cardiovascular complication will be avoided Outcome: Progressing   Problem: Elimination: Goal: Will not experience complications related to bowel motility Outcome: Progressing Goal: Will not experience complications related to urinary retention Outcome: Progressing   Problem: Pain Managment: Goal: General experience of comfort will improve Outcome: Progressing   Problem: Safety: Goal: Ability to remain free from injury will improve Outcome: Progressing   Problem: Skin Integrity: Goal: Risk for impaired skin integrity will decrease Outcome: Progressing   Problem: Self-Care: Goal: Ability to participate in self-care as  condition permits will improve Outcome: Progressing Goal: Verbalization of feelings and concerns over difficulty with self-care will improve Outcome: Progressing Goal: Ability to communicate needs accurately will improve Outcome: Progressing   Problem: Ischemic Stroke/TIA Tissue Perfusion: Goal: Complications of ischemic stroke/TIA will be minimized Outcome: Progressing   

## 2018-09-11 NOTE — Discharge Summary (Signed)
[facility] [place] [facility]   PATIENT NAME: Audrey Sexton    MR#:  [id number]  DATE OF BIRTH:  05-Mar-1951  DATE OF ADMISSION:  09/05/2018 ADMITTING PHYSICIAN: Oralia Manisavid Willis, MD  DATE OF DISCHARGE: 09/11/2018  PRIMARY CARE PHYSICIAN: Oswaldo ConroyBender, Abby Daneele, MD    ADMISSION DIAGNOSIS:  Loss of consciousness (HCC) [R40.20] Right leg weakness [R29.898] Weakness of right lower extremity [R29.898]  DISCHARGE DIAGNOSIS:  Principal Problem:   Right leg weakness Active Problems:   COPD (chronic obstructive pulmonary disease) (HCC)   HTN (hypertension)   HLD (hyperlipidemia)   Stroke (HCC)   SECONDARY DIAGNOSIS:   Past Medical History:  Diagnosis Date  . Asthma   . Carpal tunnel syndrome of left wrist   . COPD (chronic obstructive pulmonary disease) (HCC)   . Hyperlipemia   . Hypertension   . Stroke [facility](HCC)    Jan. 2020    HOSPITAL COURSE:   [age] year old female with past medical history of hypertension hyperlipidemia COPD, asthma, who presented to the hospital due to right lower extremity weakness/fall and noted to have an acute stroke.  1.  Acute CVA- patient presented to the hospital due to right lower extremity weakness, patient's MRI was positive for acute infarct in the left corona radiata, Tiny acute or subacute infarct in the right putamen. - Carotid duplex showing no hemodynamically significant carotid artery stenosis, no intracranial stenosis on MRA of the brain, and also had TEE which was negative for intracardiac thrombus.  Stable to be discharged on oral antiplatelet therapy. - As per neurology will need to arrange outpatient follow-up with cardiology for long-term monitoring including event/Loop monitor which has been done.  --continue aspirin, atorvastatin.  Seen by physical therapy and Occupational Therapy and they recommend short-term rehab and pt. To be discharged there today.   2.  Essential hypertension-pt. Will resume her Cardizem, Lasix.    3.  Urinary incontinence-she will continue Ditropan.    4.  COPD-no acute exacerbation.  Continue Incruse/Ellipta, Breo Ellipta.   5. Depression - she will cont. Effexor.   Patient is stable to be discharged to skilled nursing facility for ongoing rehab after her stroke.  Patient and family are in agreement with this plan.  DISCHARGE CONDITIONS:   Stable  CONSULTS OBTAINED:  Treatment Team:  Kym Groomriadhosp, Neuro1, MD Dalia HeadingFath, Kenneth A, MD  DRUG ALLERGIES:   Allergies  Allergen Reactions  . Percocet [Oxycodone-Acetaminophen] Nausea And Vomiting  . Vicodin [Hydrocodone-Acetaminophen] Nausea And Vomiting  . Penicillins Hives    Has patient had a PCN reaction causing immediate rash, facial/tongue/throat swelling, SOB or lightheadedness with hypotension: no Has patient had a PCN reaction causing severe rash involving mucus membranes or skin necrosis: no Has patient had a PCN reaction that required hospitalization  no Has patient had a PCN reaction occurring within the last 10 years: no If all of the above answers are "NO", then may proceed with Cephalosporin use.     DISCHARGE MEDICATIONS:   Allergies as of 09/11/2018      Reactions   Percocet [oxycodone-acetaminophen] Nausea And Vomiting   Vicodin [hydrocodone-acetaminophen] Nausea And Vomiting   Penicillins Hives   Has patient had a PCN reaction causing immediate rash, facial/tongue/throat swelling, SOB or lightheadedness with hypotension: no Has patient had a PCN reaction causing severe rash involving mucus membranes or skin necrosis: no Has patient had a PCN reaction that required hospitalization  no Has patient had a PCN reaction occurring within the last 10 years: no If all  of the above answers are "NO", then may proceed with Cephalosporin use.      Medication List    STOP taking these medications   predniSONE 10 MG (21) Tbpk tablet Commonly known as:  STERAPRED UNI-PAK 21 TAB     TAKE these medications    albuterol 108 (90 Base) MCG/ACT inhaler Commonly known as:  PROVENTIL HFA;VENTOLIN HFA Inhale 2-4 puffs by mouth every 4 hours as needed for wheezing, cough, and/or shortness of breath   aspirin 81 MG EC tablet Take 1 tablet (81 mg total) by mouth daily. Start taking on:  September 12, 2018   atorvastatin 40 MG tablet Commonly known as:  LIPITOR Take 40 mg by mouth at bedtime.   diltiazem 240 MG 24 hr capsule Commonly known as:  CARDIZEM CD Take 240 mg by mouth at bedtime.   fluticasone furoate-vilanterol 100-25 MCG/INH Aepb Commonly known as:  BREO ELLIPTA Inhale 1 puff into the lungs daily.   furosemide 40 MG tablet Commonly known as:  LASIX Take 40 mg by mouth daily.   INCRUSE ELLIPTA 62.5 MCG/INH Aepb Generic drug:  umeclidinium bromide Inhale 1 puff into the lungs daily.   MULTI-VITAMIN DAILY PO Take 1 tablet by mouth daily.   oxybutynin 5 MG tablet Commonly known as:  DITROPAN Take 5 mg by mouth 3 (three) times daily.   potassium chloride SA 20 MEQ tablet Commonly known as:  K-DUR,KLOR-CON Take 1 tablet (20 mEq total) by mouth daily.   venlafaxine XR 150 MG 24 hr capsule Commonly known as:  EFFEXOR-XR Take 150 mg by mouth at bedtime.   vitamin B-12 1000 MCG tablet Commonly known as:  CYANOCOBALAMIN Take 1,000 mcg by mouth daily.   vitamin C 1000 MG tablet Take 1,000 mg by mouth daily.   VITAMIN D-1000 MAX ST 25 MCG (1000 UT) tablet Generic drug:  Cholecalciferol Take 1,000 Units by mouth daily.         DISCHARGE INSTRUCTIONS:   DIET:  Cardiac diet  DISCHARGE CONDITION:  Stable  ACTIVITY:  Activity as tolerated  OXYGEN:  Home Oxygen: No.   Oxygen Delivery: room air  DISCHARGE LOCATION:  nursing home   If you experience worsening of your admission symptoms, develop shortness of breath, life threatening emergency, suicidal or homicidal thoughts you must seek medical attention immediately by calling 911 or calling your MD immediately  if  symptoms less severe.  You Must read complete instructions/literature along with all the possible adverse reactions/side effects for all the Medicines you take and that have been prescribed to you. Take any new Medicines after you have completely understood and accpet all the possible adverse reactions/side effects.   Please note  You were cared for by a hospitalist during your hospital stay. If you have any questions about your discharge medications or the care you received while you were in the hospital after you are discharged, you can call the unit and asked to speak with the hospitalist on call if the hospitalist that took care of you is not available. Once you are discharged, your primary care physician will handle any further medical issues. Please note that NO REFILLS for any discharge medications will be authorized once you are discharged, as it is imperative that you return to your primary care physician (or establish a relationship with a primary care physician if you do not have one) for your aftercare needs so that they can reassess your need for medications and monitor your lab values.  Today   No acute events overnight, still has some right lower extremity weakness.  Will discharge to skilled nursing facility for ongoing rehab.  VITAL SIGNS:  Blood pressure (!) 144/79, pulse (!) 105, temperature 98.3 F (36.8 C), temperature source Oral, resp. rate 19, height 5\' 4"  (1.626 m), weight 90.4 kg, SpO2 93 %.  I/O:    Intake/Output Summary (Last 24 hours) at 09/11/2018 1236 Last data filed at 09/11/2018 0954 Gross per 24 hour  Intake 1280 ml  Output 950 ml  Net 330 ml    PHYSICAL EXAMINATION:   GENERAL:  68 y.o.-year-old patient lying in bed in no acute distress.  EYES: Pupils equal, round, reactive to light and accommodation. No scleral icterus. Extraocular muscles intact.  HEENT: Head atraumatic, normocephalic. Oropharynx and nasopharynx clear. Poor Dentition.  NECK:   Supple, no jugular venous distention. No thyroid enlargement, no tenderness.  LUNGS: Normal breath sounds bilaterally, no wheezing, rales, rhonchi. No use of accessory muscles of respiration.  CARDIOVASCULAR: S1, S2 normal. No murmurs, rubs, or gallops.  ABDOMEN: Soft, nontender, nondistended. Bowel sounds present. No organomegaly or mass.  EXTREMITIES: No cyanosis, clubbing or edema b/l.    NEUROLOGIC: Cranial nerves II through XII are intact.  Right lower extremity weakness 1/5 strength. PSYCHIATRIC: The patient is alert and oriented x 3.  SKIN: No obvious rash, lesion, or ulcer.   DATA REVIEW:   CBC Recent Labs  Lab 09/11/18 0337  WBC 8.8  HGB 12.3  HCT 40.3  PLT 210    Chemistries  Recent Labs  Lab 09/11/18 0337  NA 142  K 4.1  CL 106  CO2 29  GLUCOSE 110*  BUN 29*  CREATININE 0.94  CALCIUM 9.1    Cardiac Enzymes Recent Labs  Lab 09/05/18 1948  TROPONINI <0.03    RADIOLOGY:  No results found.    Management plans discussed with the patient, family and they are in agreement.  CODE STATUS:     Code Status Orders  (From admission, onward)         Start     Ordered   09/06/18 0517  Full code  Continuous     09/06/18 0516          TOTAL TIME TAKING CARE OF THIS PATIENT: 40 minutes.    Houston SirenSAINANI,Lavoris Canizales J M.D on 09/11/2018 at 12:36 PM  Between 7am to 6pm - Pager - [phone]  After 6pm go to [email or web address] - Social research officer, governmentpassword [email or web address] [email or web address]  [facility] [place] [facility]  Office  [phone]  CC: Primary care physician; Oswaldo ConroyBender, Abby Daneele, MD

## 2018-09-11 NOTE — Progress Notes (Signed)
OT Cancellation Note  Patient Details Name: Audrey Sexton MRN: [id number] DOB: 1951-05-06   Cancelled Treatment:    Reason Eval/Treat Not Completed: Patient declined, no reason specified;Other (comment) Pt in bed upon arrival. Stated she was not feeling well, and requested OT to return in the PM. Will re-attempt as available.   Rockney Ghee, M.S., OTR/L Ascom: [phone] 09/11/18, 12:30 PM

## 2018-11-18 ENCOUNTER — Telehealth: Payer: Self-pay

## 2018-11-18 NOTE — Telephone Encounter (Signed)

## 2018-11-20 ENCOUNTER — Ambulatory Visit: Payer: Medicare Other | Admitting: Internal Medicine

## 2018-12-06 NOTE — Telephone Encounter (Signed)
RECALL PLACED FOR 03/29/2019 

## 2019-08-26 ENCOUNTER — Encounter: Payer: Self-pay | Admitting: Emergency Medicine

## 2019-08-26 ENCOUNTER — Other Ambulatory Visit: Payer: Self-pay

## 2019-08-26 ENCOUNTER — Emergency Department
Admission: EM | Admit: 2019-08-26 | Discharge: 2019-08-26 | Disposition: A | Discharge status: Home / Self Care | Payer: Medicare Other | Attending: Emergency Medicine | Admitting: Emergency Medicine

## 2019-08-26 DIAGNOSIS — J449 Chronic obstructive pulmonary disease, unspecified: Secondary | ICD-10-CM | POA: Insufficient documentation

## 2019-08-26 DIAGNOSIS — Z87891 Personal history of nicotine dependence: Secondary | ICD-10-CM | POA: Insufficient documentation

## 2019-08-26 DIAGNOSIS — Z7982 Long term (current) use of aspirin: Secondary | ICD-10-CM | POA: Diagnosis not present

## 2019-08-26 DIAGNOSIS — I10 Essential (primary) hypertension: Secondary | ICD-10-CM | POA: Insufficient documentation

## 2019-08-26 DIAGNOSIS — M7918 Myalgia, other site: Secondary | ICD-10-CM

## 2019-08-26 DIAGNOSIS — M79651 Pain in right thigh: Secondary | ICD-10-CM | POA: Insufficient documentation

## 2019-08-26 DIAGNOSIS — Z79899 Other long term (current) drug therapy: Secondary | ICD-10-CM | POA: Insufficient documentation

## 2019-08-26 MED ORDER — TRAMADOL HCL 50 MG PO TABS: 50.0000 mg | ORAL_TABLET | Freq: Four times a day (QID) | ORAL | 0 refills | Status: DC | PRN

## 2019-08-26 MED ORDER — TRAMADOL HCL 50 MG PO TABS: 50.0000 mg | ORAL_TABLET | Freq: Four times a day (QID) | ORAL | 0 refills | Status: AC | PRN

## 2019-08-26 MED ORDER — TRAMADOL HCL 50 MG PO TABS
50.0000 mg | ORAL_TABLET | Freq: Once | ORAL | Status: AC
  Administered 2019-08-26: 10:00:00 50 mg via ORAL
  Filled 2019-08-26: qty 1

## 2019-08-26 NOTE — ED Provider Notes (Signed)
[facility] Emergency Department Provider Note   ____________________________________________    I have reviewed the triage vital signs and the nursing notes.   HISTORY  Chief Complaint Leg Pain     HPI Audrey Sexton is a 68 y.o. female with history of COPD, on home oxygen who presents with complaints of right thigh pain.  Patient describes pain that started yesterday after getting out of bed.  She notes point tenderness in the anterior central right thigh, she is able to ambulate but uses a walker.  She has been putting heat on it and taking Tylenol with little improvement.  No calf pain.  No posterior leg pain.  No other complaints  Past Medical History:  Diagnosis Date  . Asthma   . Carpal tunnel syndrome of left wrist   . COPD (chronic obstructive pulmonary disease) (HCC)   . Hyperlipemia   . Hypertension   . Stroke [facility])    Jan. 2020    Patient Active Problem List   Diagnosis Date Noted  . Right leg weakness 09/06/2018  . HTN (hypertension) 09/06/2018  . HLD (hyperlipidemia) 09/06/2018  . Stroke (HCC) 09/06/2018  . Acute respiratory failure with hypoxia (HCC) 06/09/2017  . Acute respiratory distress   . DNR (do not resuscitate) discussion 08/23/2016  . Palliative care by specialist 08/23/2016  . Dyspnea 08/23/2016  . COPD (chronic obstructive pulmonary disease) (HCC) 08/18/2016    Past Surgical History:  Procedure Laterality Date  . CARPAL TUNNEL RELEASE Left   . fractured tibia Left    repair  . ROTATOR CUFF REPAIR Left   . TEE WITHOUT CARDIOVERSION N/A 09/09/2018   Procedure: TRANSESOPHAGEAL ECHOCARDIOGRAM (TEE);  Surgeon: Dalia Heading, MD;  Location: [facility] ORS;  Service: Cardiovascular;  Laterality: N/A;  . TUBAL LIGATION      Prior to Admission medications   Medication Sig Start Date End Date Taking? Authorizing Provider  albuterol (PROVENTIL HFA;VENTOLIN HFA) 108 (90 Base) MCG/ACT inhaler Inhale 2-4 puffs by mouth  every 4 hours as needed for wheezing, cough, and/or shortness of breath 01/01/17   Loleta Rose, MD  Ascorbic Acid (VITAMIN C) 1000 MG tablet Take 1,000 mg by mouth daily.    [provider]  aspirin EC 81 MG EC tablet Take 1 tablet (81 mg total) by mouth daily. 09/12/18   Houston Siren, MD  atorvastatin (LIPITOR) 40 MG tablet Take 40 mg by mouth at bedtime.     [provider]  Cholecalciferol (VITAMIN D-1000 MAX ST) 1000 units tablet Take 1,000 Units by mouth daily.    [provider]  diltiazem (CARDIZEM CD) 240 MG 24 hr capsule Take 240 mg by mouth at bedtime.     [provider]  fluticasone furoate-vilanterol (BREO ELLIPTA) 100-25 MCG/INH AEPB Inhale 1 puff into the lungs daily. 06/14/17   Milagros Loll, MD  furosemide (LASIX) 40 MG tablet Take 40 mg by mouth daily.    [provider]  Multiple Vitamin (MULTI-VITAMIN DAILY PO) Take 1 tablet by mouth daily.    [provider]  oxybutynin (DITROPAN) 5 MG tablet Take 5 mg by mouth 3 (three) times daily.     [provider]  potassium chloride SA (K-DUR,KLOR-CON) 20 MEQ tablet Take 1 tablet (20 mEq total) by mouth daily. 06/15/17   Milagros Loll, MD  traMADol (ULTRAM) 50 MG tablet Take 1 tablet (50 mg total) by mouth every 6 (six) hours as needed. 08/26/19 08/25/20  Jene Every, MD  umeclidinium bromide (INCRUSE ELLIPTA) 62.5 MCG/INH AEPB Inhale 1 puff into the lungs daily.    [provider]  venlafaxine XR (EFFEXOR-XR) 150 MG 24 hr capsule Take 150 mg by mouth at bedtime.  07/28/18   [provider]  vitamin B-12 (CYANOCOBALAMIN) 1000 MCG tablet Take 1,000 mcg by mouth daily.     [provider]     Allergies Percocet [oxycodone-acetaminophen], Vicodin [hydrocodone-acetaminophen], and Penicillins  Family History  Problem Relation Age of Onset  . Heart disease Mother        died at 1972  . Diabetes Mother   . Alzheimer's disease Father         died at 6277  . Parkinson's disease Sister   . Heart disease Brother        died at 4060  . Diabetes Brother   . Kidney disease Sister   . Breast cancer Sister     Social History Social History   Tobacco Use  . Smoking status: Former Smoker    Packs/day: 1.00    Years: 40.00    Pack years: 40.00    Types: Cigarettes  . Smokeless tobacco: Never Used  Substance Use Topics  . Alcohol use: No  . Drug use: No    Review of Systems  Constitutional: No fever/chills  Cardiovascular: Denies chest pain. Respiratory: Denies shortness of breath. Gastrointestinal: No abdominal pain.  Genitourinary: Negative for dysuria. Musculoskeletal: As above Skin: Negative for rash. Neurological: Negative for headaches    ____________________________________________   PHYSICAL EXAM:  VITAL SIGNS: ED Triage Vitals  Enc Vitals Group     BP 08/26/19 0925 (!) 174/83     Pulse Rate 08/26/19 0925 96     Resp 08/26/19 0925 16     Temp 08/26/19 0925 98.1 F (36.7 C)     Temp Source 08/26/19 0925 Oral     SpO2 08/26/19 0925 94 %     Weight 08/26/19 0927 91.6 kg (202 lb)     Height 08/26/19 0927 1.626 m (5\' 4" )     Head Circumference --      Peak Flow --      Pain Score 08/26/19 0926 10     Pain Loc --      Pain Edu? --      Excl. in GC? --     Constitutional: Alert and oriented.     Cardiovascular: Normal rate, regular rhythm. Peri [place].  Good peripheral circulation. Respiratory: Normal respiratory effort.  No retractions. . Gastrointestinal: Soft and nontender. No distention.  No CVA tenderness.  Musculoskeletal: Right lower extremity: Patient with mild tenderness palpation central right thigh anteriorly, some firmness suspicious for muscle spasm/inflammation.  No posterior leg pain.  2+ distal pulses Neurologic:  Normal speech and language. No gross focal neurologic deficits are appreciated.  Skin:  Skin is warm, dry and intact. No rash noted. Psychiatric: Mood and affect are normal.  Speech and behavior are normal.  ____________________________________________   LABS (all labs ordered are listed, but only abnormal results are displayed)  Labs Reviewed - No data to display ____________________________________________  EKG  None ____________________________________________  RADIOLOGY  None ____________________________________________   PROCEDURES  Procedure(s) performed: No  Procedures   Critical Care performed: No ____________________________________________   INITIAL IMPRESSION / ASSESSMENT AND PLAN / ED COURSE  Pertinent labs & imaging results that were available during my care of the patient were reviewed by me and considered in my medical decision making (see chart for details).  Patient with  anterior right thigh pain with point tenderness suspicious for muscle injury.  No circumferential pain or swelling, good blood flow.  Recommend supportive care outpatient follow-up with orthopedics in 1 week if no improvement    ____________________________________________   FINAL CLINICAL IMPRESSION(S) / ED DIAGNOSES  Final diagnoses:  Musculoskeletal pain        Note:  This document was prepared using Dragon voice recognition software and may include unintentional dictation errors.   Lavonia Drafts, MD 08/26/19 [phone]

## 2019-08-26 NOTE — ED Triage Notes (Addendum)
Pt arrived to ED via [facility] from home. Reports right leg pain for the past couple of days, palpable lump on right thigh. Pt denies recent fall. Pt has hx of COPD and HTN and on 2L at home

## 2019-11-25 DIAGNOSIS — R049 Hemorrhage from respiratory passages, unspecified: Secondary | ICD-10-CM | POA: Diagnosis not present

## 2019-11-25 DIAGNOSIS — J449 Chronic obstructive pulmonary disease, unspecified: Secondary | ICD-10-CM | POA: Diagnosis not present

## 2019-11-25 DIAGNOSIS — I10 Essential (primary) hypertension: Secondary | ICD-10-CM | POA: Diagnosis not present

## 2019-11-25 DIAGNOSIS — J189 Pneumonia, unspecified organism: Secondary | ICD-10-CM | POA: Diagnosis not present

## 2019-12-26 DIAGNOSIS — R049 Hemorrhage from respiratory passages, unspecified: Secondary | ICD-10-CM | POA: Diagnosis not present

## 2019-12-26 DIAGNOSIS — J189 Pneumonia, unspecified organism: Secondary | ICD-10-CM | POA: Diagnosis not present

## 2019-12-26 DIAGNOSIS — I10 Essential (primary) hypertension: Secondary | ICD-10-CM | POA: Diagnosis not present

## 2019-12-26 DIAGNOSIS — J449 Chronic obstructive pulmonary disease, unspecified: Secondary | ICD-10-CM | POA: Diagnosis not present

## 2020-01-25 DIAGNOSIS — I10 Essential (primary) hypertension: Secondary | ICD-10-CM | POA: Diagnosis not present

## 2020-01-25 DIAGNOSIS — J189 Pneumonia, unspecified organism: Secondary | ICD-10-CM | POA: Diagnosis not present

## 2020-01-25 DIAGNOSIS — J449 Chronic obstructive pulmonary disease, unspecified: Secondary | ICD-10-CM | POA: Diagnosis not present

## 2020-01-25 DIAGNOSIS — R049 Hemorrhage from respiratory passages, unspecified: Secondary | ICD-10-CM | POA: Diagnosis not present

## 2020-02-03 DIAGNOSIS — F321 Major depressive disorder, single episode, moderate: Secondary | ICD-10-CM | POA: Diagnosis not present

## 2020-02-03 DIAGNOSIS — R600 Localized edema: Secondary | ICD-10-CM | POA: Diagnosis not present

## 2020-02-03 DIAGNOSIS — E876 Hypokalemia: Secondary | ICD-10-CM | POA: Diagnosis not present

## 2020-02-03 DIAGNOSIS — E785 Hyperlipidemia, unspecified: Secondary | ICD-10-CM | POA: Diagnosis not present

## 2020-02-03 DIAGNOSIS — J449 Chronic obstructive pulmonary disease, unspecified: Secondary | ICD-10-CM | POA: Diagnosis not present

## 2020-02-03 DIAGNOSIS — J9611 Chronic respiratory failure with hypoxia: Secondary | ICD-10-CM | POA: Diagnosis not present

## 2020-02-03 DIAGNOSIS — D692 Other nonthrombocytopenic purpura: Secondary | ICD-10-CM | POA: Diagnosis not present

## 2020-02-03 DIAGNOSIS — R32 Unspecified urinary incontinence: Secondary | ICD-10-CM | POA: Diagnosis not present

## 2020-02-04 ENCOUNTER — Other Ambulatory Visit: Payer: Self-pay

## 2020-02-04 ENCOUNTER — Inpatient Hospital Stay
Admission: EM | Admit: 2020-02-04 | Discharge: 2020-02-08 | DRG: 189 | Disposition: A | Discharge status: Home Health | Payer: Medicare HMO | Attending: Internal Medicine | Admitting: Internal Medicine

## 2020-02-04 ENCOUNTER — Inpatient Hospital Stay (HOSPITAL_COMMUNITY)
Admit: 2020-02-04 | Discharge: 2020-02-04 | Disposition: A | Discharge status: Home / Self Care | Payer: Medicare HMO | Attending: Internal Medicine | Admitting: Internal Medicine

## 2020-02-04 ENCOUNTER — Emergency Department: Payer: Medicare HMO

## 2020-02-04 DIAGNOSIS — I480 Paroxysmal atrial fibrillation: Secondary | ICD-10-CM | POA: Diagnosis present

## 2020-02-04 DIAGNOSIS — Y92239 Unspecified place in hospital as the place of occurrence of the external cause: Secondary | ICD-10-CM | POA: Diagnosis not present

## 2020-02-04 DIAGNOSIS — I110 Hypertensive heart disease with heart failure: Secondary | ICD-10-CM | POA: Diagnosis present

## 2020-02-04 DIAGNOSIS — Z820 Family history of epilepsy and other diseases of the nervous system: Secondary | ICD-10-CM

## 2020-02-04 DIAGNOSIS — J449 Chronic obstructive pulmonary disease, unspecified: Secondary | ICD-10-CM | POA: Diagnosis not present

## 2020-02-04 DIAGNOSIS — J441 Chronic obstructive pulmonary disease with (acute) exacerbation: Secondary | ICD-10-CM | POA: Diagnosis not present

## 2020-02-04 DIAGNOSIS — Z833 Family history of diabetes mellitus: Secondary | ICD-10-CM

## 2020-02-04 DIAGNOSIS — Z7951 Long term (current) use of inhaled steroids: Secondary | ICD-10-CM

## 2020-02-04 DIAGNOSIS — Z885 Allergy status to narcotic agent status: Secondary | ICD-10-CM

## 2020-02-04 DIAGNOSIS — J9600 Acute respiratory failure, unspecified whether with hypoxia or hypercapnia: Secondary | ICD-10-CM | POA: Diagnosis not present

## 2020-02-04 DIAGNOSIS — I4891 Unspecified atrial fibrillation: Secondary | ICD-10-CM

## 2020-02-04 DIAGNOSIS — Z20822 Contact with and (suspected) exposure to covid-19: Secondary | ICD-10-CM | POA: Diagnosis not present

## 2020-02-04 DIAGNOSIS — I5033 Acute on chronic diastolic (congestive) heart failure: Secondary | ICD-10-CM | POA: Diagnosis not present

## 2020-02-04 DIAGNOSIS — J9622 Acute and chronic respiratory failure with hypercapnia: Secondary | ICD-10-CM

## 2020-02-04 DIAGNOSIS — Z7982 Long term (current) use of aspirin: Secondary | ICD-10-CM

## 2020-02-04 DIAGNOSIS — R000 Tachycardia, unspecified: Secondary | ICD-10-CM | POA: Diagnosis present

## 2020-02-04 DIAGNOSIS — J440 Chronic obstructive pulmonary disease with acute lower respiratory infection: Secondary | ICD-10-CM | POA: Diagnosis not present

## 2020-02-04 DIAGNOSIS — Z87891 Personal history of nicotine dependence: Secondary | ICD-10-CM | POA: Diagnosis not present

## 2020-02-04 DIAGNOSIS — Z66 Do not resuscitate: Secondary | ICD-10-CM | POA: Diagnosis not present

## 2020-02-04 DIAGNOSIS — R739 Hyperglycemia, unspecified: Secondary | ICD-10-CM | POA: Diagnosis not present

## 2020-02-04 DIAGNOSIS — Z8249 Family history of ischemic heart disease and other diseases of the circulatory system: Secondary | ICD-10-CM

## 2020-02-04 DIAGNOSIS — Z880 Allergy status to penicillin: Secondary | ICD-10-CM

## 2020-02-04 DIAGNOSIS — J439 Emphysema, unspecified: Secondary | ICD-10-CM | POA: Diagnosis not present

## 2020-02-04 DIAGNOSIS — Z803 Family history of malignant neoplasm of breast: Secondary | ICD-10-CM | POA: Diagnosis not present

## 2020-02-04 DIAGNOSIS — I10 Essential (primary) hypertension: Secondary | ICD-10-CM | POA: Diagnosis present

## 2020-02-04 DIAGNOSIS — I5043 Acute on chronic combined systolic (congestive) and diastolic (congestive) heart failure: Secondary | ICD-10-CM

## 2020-02-04 DIAGNOSIS — R0602 Shortness of breath: Secondary | ICD-10-CM | POA: Diagnosis not present

## 2020-02-04 DIAGNOSIS — I5032 Chronic diastolic (congestive) heart failure: Secondary | ICD-10-CM | POA: Diagnosis present

## 2020-02-04 DIAGNOSIS — J209 Acute bronchitis, unspecified: Secondary | ICD-10-CM | POA: Diagnosis not present

## 2020-02-04 DIAGNOSIS — J9621 Acute and chronic respiratory failure with hypoxia: Secondary | ICD-10-CM | POA: Diagnosis not present

## 2020-02-04 DIAGNOSIS — D649 Anemia, unspecified: Secondary | ICD-10-CM | POA: Diagnosis present

## 2020-02-04 DIAGNOSIS — J9601 Acute respiratory failure with hypoxia: Secondary | ICD-10-CM | POA: Diagnosis not present

## 2020-02-04 DIAGNOSIS — Z8673 Personal history of transient ischemic attack (TIA), and cerebral infarction without residual deficits: Secondary | ICD-10-CM | POA: Diagnosis not present

## 2020-02-04 DIAGNOSIS — E785 Hyperlipidemia, unspecified: Secondary | ICD-10-CM | POA: Diagnosis present

## 2020-02-04 DIAGNOSIS — R079 Chest pain, unspecified: Secondary | ICD-10-CM | POA: Diagnosis not present

## 2020-02-04 DIAGNOSIS — T380X5A Adverse effect of glucocorticoids and synthetic analogues, initial encounter: Secondary | ICD-10-CM | POA: Diagnosis not present

## 2020-02-04 DIAGNOSIS — E876 Hypokalemia: Secondary | ICD-10-CM | POA: Diagnosis not present

## 2020-02-04 DIAGNOSIS — Z841 Family history of disorders of kidney and ureter: Secondary | ICD-10-CM | POA: Diagnosis not present

## 2020-02-04 DIAGNOSIS — I5031 Acute diastolic (congestive) heart failure: Secondary | ICD-10-CM

## 2020-02-04 DIAGNOSIS — Z79899 Other long term (current) drug therapy: Secondary | ICD-10-CM

## 2020-02-04 LAB — BRAIN NATRIURETIC PEPTIDE: B Natriuretic Peptide: 92.2 pg/mL (ref 0.0–100.0)

## 2020-02-04 LAB — CBC WITH DIFFERENTIAL/PLATELET
Abs Immature Granulocytes: 0.04 10*3/uL (ref 0.00–0.07)
Basophils Absolute: 0.1 10*3/uL (ref 0.0–0.1)
Basophils Relative: 1 %
Eosinophils Absolute: 0.3 10*3/uL (ref 0.0–0.5)
Eosinophils Relative: 3 %
HCT: 34.6 % — ABNORMAL LOW (ref 36.0–46.0)
Hemoglobin: 11.1 g/dL — ABNORMAL LOW (ref 12.0–15.0)
Immature Granulocytes: 0 %
Lymphocytes Relative: 35 %
Lymphs Abs: 3.4 10*3/uL (ref 0.7–4.0)
MCH: 29.7 pg (ref 26.0–34.0)
MCHC: 32.1 g/dL (ref 30.0–36.0)
MCV: 92.5 fL (ref 80.0–100.0)
Monocytes Absolute: 1.3 10*3/uL — ABNORMAL HIGH (ref 0.1–1.0)
Monocytes Relative: 13 %
Neutro Abs: 4.7 10*3/uL (ref 1.7–7.7)
Neutrophils Relative %: 48 %
Platelets: 264 10*3/uL (ref 150–400)
RBC: 3.74 MIL/uL — ABNORMAL LOW (ref 3.87–5.11)
RDW: 14.3 % (ref 11.5–15.5)
WBC: 9.7 10*3/uL (ref 4.0–10.5)
nRBC: 0 % (ref 0.0–0.2)

## 2020-02-04 LAB — ECHOCARDIOGRAM COMPLETE
Height: 67 in
Weight: 3200 oz

## 2020-02-04 LAB — BLOOD GAS, VENOUS
Acid-Base Excess: 4 mmol/L — ABNORMAL HIGH (ref 0.0–2.0)
Bicarbonate: 32.7 mmol/L — ABNORMAL HIGH (ref 20.0–28.0)
pCO2, Ven: 68 mmHg — ABNORMAL HIGH (ref 44.0–60.0)
pH, Ven: 7.29 (ref 7.250–7.430)
pO2, Ven: 23 mmHg — CL (ref 32.0–45.0)

## 2020-02-04 LAB — CBC
HCT: 34.9 % — ABNORMAL LOW (ref 36.0–46.0)
Hemoglobin: 11.1 g/dL — ABNORMAL LOW (ref 12.0–15.0)
MCH: 30.1 pg (ref 26.0–34.0)
MCHC: 31.8 g/dL (ref 30.0–36.0)
MCV: 94.6 fL (ref 80.0–100.0)
Platelets: 233 10*3/uL (ref 150–400)
RBC: 3.69 MIL/uL — ABNORMAL LOW (ref 3.87–5.11)
RDW: 14.3 % (ref 11.5–15.5)
WBC: 8.9 10*3/uL (ref 4.0–10.5)
nRBC: 0 % (ref 0.0–0.2)

## 2020-02-04 LAB — BASIC METABOLIC PANEL
Anion gap: 8 (ref 5–15)
Anion gap: 10 (ref 5–15)
BUN: 16 mg/dL (ref 8–23)
BUN: 16 mg/dL (ref 8–23)
CO2: 31 mmol/L (ref 22–32)
CO2: 25 mmol/L (ref 22–32)
Calcium: 9.3 mg/dL (ref 8.9–10.3)
Calcium: 9.3 mg/dL (ref 8.9–10.3)
Chloride: 106 mmol/L (ref 98–111)
Chloride: 108 mmol/L (ref 98–111)
Creatinine, Ser: 1.08 mg/dL — ABNORMAL HIGH (ref 0.44–1.00)
Creatinine, Ser: 1.06 mg/dL — ABNORMAL HIGH (ref 0.44–1.00)
GFR calc Af Amer: >60 mL/min (ref >60)
GFR calc Af Amer: >60 mL/min (ref >60)
GFR calc non Af Amer: 52 mL/min — ABNORMAL LOW (ref >60)
GFR calc non Af Amer: 53 mL/min — ABNORMAL LOW (ref >60)
Glucose, Bld: 127 mg/dL — ABNORMAL HIGH (ref 70–99)
Glucose, Bld: 236 mg/dL — ABNORMAL HIGH (ref 70–99)
Potassium: 3.7 mmol/L (ref 3.5–5.1)
Potassium: 3.4 mmol/L — ABNORMAL LOW (ref 3.5–5.1)
Sodium: 145 mmol/L (ref 135–145)
Sodium: 143 mmol/L (ref 135–145)

## 2020-02-04 LAB — HIV ANTIBODY (ROUTINE TESTING W REFLEX): HIV Screen 4th Generation wRfx: NONREACTIVE

## 2020-02-04 LAB — RESPIRATORY PANEL BY RT PCR (FLU A&B, COVID)
Influenza A by PCR: NEGATIVE
Influenza B by PCR: NEGATIVE
SARS Coronavirus 2 by RT PCR: NEGATIVE

## 2020-02-04 LAB — TROPONIN I (HIGH SENSITIVITY)
Troponin I (High Sensitivity): 7 ng/L (ref <18)
Troponin I (High Sensitivity): 5 ng/L (ref <18)
Troponin I (High Sensitivity): 5 ng/L (ref <18)

## 2020-02-04 MED ORDER — IPRATROPIUM-ALBUTEROL 0.5-2.5 (3) MG/3ML IN SOLN
3.0000 mL | Freq: Four times a day (QID) | RESPIRATORY_TRACT | Status: DC
  Administered 2020-02-04 – 2020-02-08 (×17): 3 mL via RESPIRATORY_TRACT
  Filled 2020-02-04 (×18): qty 3

## 2020-02-04 MED ORDER — SODIUM CHLORIDE 0.9 % IV SOLN: INTRAVENOUS | Status: DC

## 2020-02-04 MED ORDER — ENOXAPARIN SODIUM 40 MG/0.4ML ~~LOC~~ SOLN
40.0000 mg | SUBCUTANEOUS | Status: DC
  Administered 2020-02-04 – 2020-02-08 (×5): 40 mg via SUBCUTANEOUS
  Filled 2020-02-04 (×5): qty 0.4

## 2020-02-04 MED ORDER — TRAZODONE HCL 50 MG PO TABS: 25.0000 mg | ORAL_TABLET | Freq: Every evening | ORAL | Status: DC | PRN

## 2020-02-04 MED ORDER — SODIUM CHLORIDE 0.9 % IV SOLN
500.0000 mg | INTRAVENOUS | Status: DC
  Administered 2020-02-04 – 2020-02-05 (×2): 500 mg via INTRAVENOUS
  Filled 2020-02-04 (×2): qty 500

## 2020-02-04 MED ORDER — IPRATROPIUM-ALBUTEROL 0.5-2.5 (3) MG/3ML IN SOLN
RESPIRATORY_TRACT | Status: AC
  Administered 2020-02-04: 9 mL via RESPIRATORY_TRACT
  Filled 2020-02-04: qty 9

## 2020-02-04 MED ORDER — METHYLPREDNISOLONE SODIUM SUCC 40 MG IJ SOLR
40.0000 mg | Freq: Four times a day (QID) | INTRAMUSCULAR | Status: DC
  Administered 2020-02-04 (×2): 40 mg via INTRAVENOUS
  Filled 2020-02-04 (×2): qty 1

## 2020-02-04 MED ORDER — VITAMIN B-12 1000 MCG PO TABS
1000.0000 ug | ORAL_TABLET | Freq: Every day | ORAL | Status: DC
  Administered 2020-02-04 – 2020-02-08 (×5): 1000 ug via ORAL
  Filled 2020-02-04 (×6): qty 1

## 2020-02-04 MED ORDER — LISINOPRIL 20 MG PO TABS
20.0000 mg | ORAL_TABLET | Freq: Every day | ORAL | Status: DC
  Administered 2020-02-04 – 2020-02-08 (×5): 20 mg via ORAL
  Filled 2020-02-04 (×3): qty 1
  Filled 2020-02-04: qty 2
  Filled 2020-02-04: qty 1

## 2020-02-04 MED ORDER — IPRATROPIUM-ALBUTEROL 0.5-2.5 (3) MG/3ML IN SOLN
3.0000 mL | Freq: Four times a day (QID) | RESPIRATORY_TRACT | Status: DC | PRN
  Administered 2020-02-08: 3 mL via RESPIRATORY_TRACT
  Filled 2020-02-04: qty 3

## 2020-02-04 MED ORDER — FUROSEMIDE 40 MG PO TABS
40.0000 mg | ORAL_TABLET | Freq: Every day | ORAL | Status: DC
  Administered 2020-02-05 – 2020-02-08 (×4): 40 mg via ORAL
  Filled 2020-02-04 (×5): qty 1

## 2020-02-04 MED ORDER — ONDANSETRON HCL 4 MG/2ML IJ SOLN: 4.0000 mg | Freq: Four times a day (QID) | INTRAMUSCULAR | Status: DC | PRN

## 2020-02-04 MED ORDER — LISINOPRIL-HYDROCHLOROTHIAZIDE 20-12.5 MG PO TABS: 1.0000 | ORAL_TABLET | Freq: Every day | ORAL | Status: DC

## 2020-02-04 MED ORDER — ACETAMINOPHEN 650 MG RE SUPP: 650.0000 mg | Freq: Four times a day (QID) | RECTAL | Status: DC | PRN

## 2020-02-04 MED ORDER — TRAMADOL HCL 50 MG PO TABS
50.0000 mg | ORAL_TABLET | Freq: Four times a day (QID) | ORAL | Status: DC | PRN
  Administered 2020-02-06 – 2020-02-07 (×3): 50 mg via ORAL
  Filled 2020-02-04 (×3): qty 1

## 2020-02-04 MED ORDER — MAGNESIUM SULFATE 2 GM/50ML IV SOLN
2.0000 g | Freq: Once | INTRAVENOUS | Status: AC
  Administered 2020-02-04: 2 g via INTRAVENOUS
  Filled 2020-02-04: qty 50

## 2020-02-04 MED ORDER — ONDANSETRON HCL 4 MG PO TABS
4.0000 mg | ORAL_TABLET | Freq: Four times a day (QID) | ORAL | Status: DC | PRN
  Administered 2020-02-06: 13:00:00 4 mg via ORAL
  Filled 2020-02-04: qty 1

## 2020-02-04 MED ORDER — PERFLUTREN LIPID MICROSPHERE
1.0000 mL | INTRAVENOUS | Status: AC | PRN
  Administered 2020-02-04: 3 mL via INTRAVENOUS
  Filled 2020-02-04: qty 10

## 2020-02-04 MED ORDER — MAGNESIUM HYDROXIDE 400 MG/5ML PO SUSP
30.0000 mL | Freq: Every day | ORAL | Status: DC | PRN
  Filled 2020-02-04: qty 30

## 2020-02-04 MED ORDER — IPRATROPIUM-ALBUTEROL 0.5-2.5 (3) MG/3ML IN SOLN: 9.0000 mL | Freq: Once | RESPIRATORY_TRACT | Status: AC

## 2020-02-04 MED ORDER — ASPIRIN EC 81 MG PO TBEC
81.0000 mg | DELAYED_RELEASE_TABLET | Freq: Every day | ORAL | Status: DC
  Administered 2020-02-04 – 2020-02-08 (×5): 81 mg via ORAL
  Filled 2020-02-04 (×5): qty 1

## 2020-02-04 MED ORDER — POTASSIUM CHLORIDE CRYS ER 20 MEQ PO TBCR
40.0000 meq | EXTENDED_RELEASE_TABLET | Freq: Every day | ORAL | Status: DC
  Administered 2020-02-04 – 2020-02-08 (×5): 40 meq via ORAL
  Filled 2020-02-04 (×5): qty 2

## 2020-02-04 MED ORDER — VITAMIN D 25 MCG (1000 UNIT) PO TABS
1000.0000 [IU] | ORAL_TABLET | Freq: Every day | ORAL | Status: DC
  Administered 2020-02-04 – 2020-02-08 (×5): 1000 [IU] via ORAL
  Filled 2020-02-04 (×5): qty 1

## 2020-02-04 MED ORDER — ADULT MULTIVITAMIN W/MINERALS CH
ORAL_TABLET | Freq: Every day | ORAL | Status: DC
  Administered 2020-02-05 – 2020-02-08 (×4): 1 via ORAL
  Filled 2020-02-04 (×5): qty 1

## 2020-02-04 MED ORDER — SODIUM CHLORIDE 0.9 % IV SOLN
1.0000 g | INTRAVENOUS | Status: AC
  Administered 2020-02-04 – 2020-02-08 (×5): 1 g via INTRAVENOUS
  Filled 2020-02-04 (×2): qty 1
  Filled 2020-02-04: qty 10
  Filled 2020-02-04: qty 1
  Filled 2020-02-04: qty 10
  Filled 2020-02-04: qty 1

## 2020-02-04 MED ORDER — PREDNISONE 20 MG PO TABS: 40.0000 mg | ORAL_TABLET | Freq: Every day | ORAL | Status: DC

## 2020-02-04 MED ORDER — UMECLIDINIUM BROMIDE 62.5 MCG/INH IN AEPB
1.0000 | INHALATION_SPRAY | Freq: Every day | RESPIRATORY_TRACT | Status: DC
  Administered 2020-02-04 – 2020-02-08 (×5): 1 via RESPIRATORY_TRACT
  Filled 2020-02-04: qty 7

## 2020-02-04 MED ORDER — VENLAFAXINE HCL ER 75 MG PO CP24
150.0000 mg | ORAL_CAPSULE | Freq: Every day | ORAL | Status: DC
  Administered 2020-02-04 – 2020-02-07 (×4): 150 mg via ORAL
  Filled 2020-02-04: qty 1
  Filled 2020-02-04: qty 2
  Filled 2020-02-04: qty 1
  Filled 2020-02-04 (×2): qty 2

## 2020-02-04 MED ORDER — FUROSEMIDE 10 MG/ML IJ SOLN
60.0000 mg | Freq: Once | INTRAMUSCULAR | Status: AC
  Administered 2020-02-04: 60 mg via INTRAVENOUS
  Filled 2020-02-04: qty 8

## 2020-02-04 MED ORDER — DILTIAZEM HCL ER COATED BEADS 240 MG PO CP24
240.0000 mg | ORAL_CAPSULE | Freq: Every day | ORAL | Status: DC
  Administered 2020-02-04: 240 mg via ORAL
  Filled 2020-02-04 (×2): qty 1

## 2020-02-04 MED ORDER — OXYBUTYNIN CHLORIDE ER 5 MG PO TB24
15.0000 mg | ORAL_TABLET | Freq: Every day | ORAL | Status: DC
  Administered 2020-02-04 – 2020-02-08 (×5): 15 mg via ORAL
  Filled 2020-02-04 (×6): qty 1

## 2020-02-04 MED ORDER — GUAIFENESIN ER 600 MG PO TB12
600.0000 mg | ORAL_TABLET | Freq: Two times a day (BID) | ORAL | Status: DC
  Administered 2020-02-04 – 2020-02-08 (×9): 600 mg via ORAL
  Filled 2020-02-04 (×10): qty 1

## 2020-02-04 MED ORDER — HYDROCHLOROTHIAZIDE 12.5 MG PO CAPS
12.5000 mg | ORAL_CAPSULE | Freq: Every day | ORAL | Status: DC
  Administered 2020-02-04 – 2020-02-08 (×5): 12.5 mg via ORAL
  Filled 2020-02-04 (×5): qty 1

## 2020-02-04 MED ORDER — VENLAFAXINE HCL ER 150 MG PO CP24
150.0000 mg | ORAL_CAPSULE | Freq: Every day | ORAL | Status: DC
  Filled 2020-02-04: qty 1

## 2020-02-04 MED ORDER — POTASSIUM CHLORIDE CRYS ER 20 MEQ PO TBCR
20.0000 meq | EXTENDED_RELEASE_TABLET | Freq: Every day | ORAL | Status: DC
  Filled 2020-02-04: qty 1

## 2020-02-04 MED ORDER — METHYLPREDNISOLONE SODIUM SUCC 125 MG IJ SOLR
60.0000 mg | Freq: Three times a day (TID) | INTRAMUSCULAR | Status: DC
  Administered 2020-02-04 – 2020-02-08 (×11): 60 mg via INTRAVENOUS
  Filled 2020-02-04 (×11): qty 2

## 2020-02-04 MED ORDER — ATORVASTATIN CALCIUM 20 MG PO TABS
40.0000 mg | ORAL_TABLET | Freq: Every day | ORAL | Status: DC
  Administered 2020-02-04 – 2020-02-07 (×3): 40 mg via ORAL
  Filled 2020-02-04 (×3): qty 2

## 2020-02-04 MED ORDER — ASCORBIC ACID 500 MG PO TABS
1000.0000 mg | ORAL_TABLET | Freq: Every day | ORAL | Status: DC
  Administered 2020-02-04 – 2020-02-08 (×5): 1000 mg via ORAL
  Filled 2020-02-04 (×5): qty 2

## 2020-02-04 MED ORDER — ACETAMINOPHEN 325 MG PO TABS
650.0000 mg | ORAL_TABLET | Freq: Four times a day (QID) | ORAL | Status: DC | PRN
  Administered 2020-02-04 – 2020-02-06 (×4): 650 mg via ORAL
  Filled 2020-02-04 (×4): qty 2

## 2020-02-04 NOTE — ED Notes (Signed)
Pt assisted from toilet back to bed. Purewick in place. Pt given coffee and sprite. Pt had one medium sized loose bowel movement.

## 2020-02-04 NOTE — ED Provider Notes (Signed)
[facility] Emergency Department Provider Note  ____________________________________________  Time seen: Approximately 1:57 AM  I have reviewed the triage vital signs and the nursing notes.   HISTORY  Chief Complaint Shortness of Breath   HPI Audrey Sexton is a 69 y.o. female with a history of asthma, COPD on 3 L, hypertension, hyperlipidemia, CVA who presents for evaluation of shortness of breath.  Patient reports progressively worsening wheezing and shortness of breath for the last several days.  Has been using her inhaler which was helping until this evening.  This evening she became severely short of breath and unresponsive to her breathing treatments.  When EMS arrive patient was satting in the 80s on her chronic 3 L at home.  Patient received 3 DuoNeb's in route and 125 mg of Solu-Medrol.  Patient denies fever.  No Covid exposure or vaccines.  She is complaining of intermittent sharp chest pain which she has had before with COPD exacerbation.  She is complaining of a productive cough.  No history of CHF, no personal or family history of PE or DVT, no recent travel immobilization, no leg pain, no hemoptysis or exogenous hormones.   Past Medical History:  Diagnosis Date  . Asthma   . Carpal tunnel syndrome of left wrist   . COPD (chronic obstructive pulmonary disease) (HCC)   . Hyperlipemia   . Hypertension   . Stroke [facility])    Jan. 2020    Patient Active Problem List   Diagnosis Date Noted  . Right leg weakness 09/06/2018  . HTN (hypertension) 09/06/2018  . HLD (hyperlipidemia) 09/06/2018  . Stroke (HCC) 09/06/2018  . Acute respiratory failure with hypoxia (HCC) 06/09/2017  . Acute respiratory distress   . DNR (do not resuscitate) discussion 08/23/2016  . Palliative care by specialist 08/23/2016  . Dyspnea 08/23/2016  . COPD (chronic obstructive pulmonary disease) (HCC) 08/18/2016    Past Surgical History:  Procedure Laterality Date  .  CARPAL TUNNEL RELEASE Left   . fractured tibia Left    repair  . ROTATOR CUFF REPAIR Left   . TEE WITHOUT CARDIOVERSION N/A 09/09/2018   Procedure: TRANSESOPHAGEAL ECHOCARDIOGRAM (TEE);  Surgeon: Dalia Heading, MD;  Location: [facility] ORS;  Service: Cardiovascular;  Laterality: N/A;  . TUBAL LIGATION      Prior to Admission medications   Medication Sig Start Date End Date Taking? Authorizing Provider  albuterol (PROVENTIL HFA;VENTOLIN HFA) 108 (90 Base) MCG/ACT inhaler Inhale 2-4 puffs by mouth every 4 hours as needed for wheezing, cough, and/or shortness of breath 01/01/17  Yes Loleta Rose, MD  Ascorbic Acid (VITAMIN C) 1000 MG tablet Take 1,000 mg by mouth daily.   Yes [provider]  aspirin EC 81 MG EC tablet Take 1 tablet (81 mg total) by mouth daily. 09/12/18  Yes Houston Siren, MD  atorvastatin (LIPITOR) 40 MG tablet Take 40 mg by mouth at bedtime.    Yes [provider]  Cholecalciferol (VITAMIN D-1000 MAX ST) 1000 units tablet Take 1,000 Units by mouth daily.   Yes [provider]  diltiazem (CARDIZEM CD) 240 MG 24 hr capsule Take 240 mg by mouth at bedtime.    Yes [provider]  fluticasone furoate-vilanterol (BREO ELLIPTA) 100-25 MCG/INH AEPB Inhale 1 puff into the lungs daily. 06/14/17  Yes Sudini, Wardell Heath, MD  furosemide (LASIX) 40 MG tablet Take 40 mg by mouth daily.   Yes [provider]  lisinopril-hydrochlorothiazide (ZESTORETIC) 20-12.5 MG tablet Take 1 tablet by  mouth daily. 12/29/19  Yes [provider]  Multiple Vitamin (MULTI-VITAMIN DAILY PO) Take 1 tablet by mouth daily.   Yes [provider]  oxybutynin (DITROPAN XL) 15 MG 24 hr tablet Take 15 mg by mouth daily. 02/03/20  Yes [provider]  potassium chloride SA (K-DUR,KLOR-CON) 20 MEQ tablet Take 1 tablet (20 mEq total) by mouth daily. 06/15/17  Yes Sudini, Wardell Heath, MD  traMADol (ULTRAM) 50 MG tablet Take 1 tablet (50 mg total) by mouth every 6  (six) hours as needed. 08/26/19 08/25/20 Yes Jene Every, MD  umeclidinium bromide (INCRUSE ELLIPTA) 62.5 MCG/INH AEPB Inhale 1 puff into the lungs daily.   Yes [provider]  venlafaxine XR (EFFEXOR-XR) 150 MG 24 hr capsule Take 150 mg by mouth at bedtime.  07/28/18  Yes [provider]  vitamin B-12 (CYANOCOBALAMIN) 1000 MCG tablet Take 1,000 mcg by mouth daily.    Yes [provider]    Allergies Percocet [oxycodone-acetaminophen], Vicodin [hydrocodone-acetaminophen], and Penicillins  Family History  Problem Relation Age of Onset  . Heart disease Mother        died at 67  . Diabetes Mother   . Alzheimer's disease Father        died at 42  . Parkinson's disease Sister   . Heart disease Brother        died at 39  . Diabetes Brother   . Kidney disease Sister   . Breast cancer Sister     Social History Social History   Tobacco Use  . Smoking status: Former Smoker    Packs/day: 1.00    Years: 40.00    Pack years: 40.00    Types: Cigarettes  . Smokeless tobacco: Never Used  Substance Use Topics  . Alcohol use: No  . Drug use: No    Review of Systems  Constitutional: Negative for fever. Eyes: Negative for visual changes. ENT: Negative for sore throat. Neck: No neck pain  Cardiovascular: + chest pain. Respiratory: + shortness of breath, wheezing, cough Gastrointestinal: Negative for abdominal pain, vomiting or diarrhea. Genitourinary: Negative for dysuria. Musculoskeletal: Negative for back pain. Skin: Negative for rash. Neurological: Negative for headaches, weakness or numbness. Psych: No SI or HI  ____________________________________________   PHYSICAL EXAM:  VITAL SIGNS: Vitals:   02/04/20 0159  BP: (!) 151/77  Pulse: (!) 113  Resp: (!) 25  Temp: 98.3 F (36.8 C)  SpO2: 98%    Constitutional: Alert and oriented, moderate respiratory distress with audible wheezing.  HEENT:      Head: Normocephalic and atraumatic.          Eyes: Conjunctivae are normal. Sclera is non-icteric.       Mouth/Throat: Mucous membranes are moist.       Neck: Supple with no signs of meningismus. Cardiovascular: Tachycardic with regular rhythm. Respiratory: Moderate respiratory distress with audible wheezing, diffuse inspiratory and expiratory wheezing bilaterally, on a nonrebreather currently receiving a breathing treatment..  Gastrointestinal: Soft, non tender. Musculoskeletal: 1+ pitting edema bilaterally. Neurologic: Normal speech and language. Face is symmetric. Moving all extremities. No gross focal neurologic deficits are appreciated. Skin: Skin is warm, dry and intact. No rash noted. Psychiatric: Mood and affect are normal. Speech and behavior are normal.  ____________________________________________   LABS (all labs ordered are listed, but only abnormal results are displayed)  Labs Reviewed  BLOOD GAS, VENOUS - Abnormal; Notable for the following components:      Result Value   pCO2, Ven 68 (*)  pO2, Ven 23.0 (*)    Bicarbonate 32.7 (*)    Acid-Base Excess 4.0 (*)    All other components within normal limits  CBC WITH DIFFERENTIAL/PLATELET - Abnormal; Notable for the following components:   RBC 3.74 (*)    Hemoglobin 11.1 (*)    HCT 34.6 (*)    Monocytes Absolute 1.3 (*)    All other components within normal limits  BASIC METABOLIC PANEL - Abnormal; Notable for the following components:   Glucose, Bld 127 (*)    Creatinine, Ser 1.08 (*)    GFR calc non Af Amer 52 (*)    All other components within normal limits  RESPIRATORY PANEL BY RT PCR (FLU A&B, COVID)  BRAIN NATRIURETIC PEPTIDE  TROPONIN I (HIGH SENSITIVITY)   ____________________________________________  EKG  ED ECG REPORT I, Rudene Re, the attending physician, personally viewed and interpreted this ECG.  Sinus tachycardia, rate of 116, normal intervals, normal axis, no ST elevations or  depressions. ____________________________________________  RADIOLOGY  I have personally reviewed the images performed during this visit and I agree with the Radiologist's read.   Interpretation by Radiologist:  DG Chest Portable 1 View  Result Date: 02/04/2020 CLINICAL DATA:  Shortness of breath EXAM: PORTABLE CHEST 1 VIEW COMPARISON:  September 05, 2018 FINDINGS: Coarse lung markings are again noted at the lung bases. There are moderate to severe emphysematous changes. There is no evidence for pneumothorax. There is no significant pleural effusion. The heart size is stable from prior study. Aortic calcifications are noted. There is no acute osseous abnormality. IMPRESSION: 1. No acute cardiopulmonary process. 2. COPD with bibasilar scarring and/or atelectasis. Electronically Signed   By: Constance Holster M.D.   On: 02/04/2020 02:26      ____________________________________________   PROCEDURES  Procedure(s) performed:yes .1-3 Lead EKG Interpretation Performed by: Rudene Re, MD Authorized by: Rudene Re, MD     Interpretation: non-specific     ECG rate assessment: tachycardic     Rhythm: sinus tachycardia     Ectopy: none     Conduction: normal     Critical Care performed: yes  CRITICAL CARE Performed by: Rudene Re  ?  Total critical care time: 40 min  Critical care time was exclusive of separately billable procedures and treating other patients.  Critical care was necessary to treat or prevent imminent or life-threatening deterioration.  Critical care was time spent personally by me on the following activities: development of treatment plan with patient and/or surrogate as well as nursing, discussions with consultants, evaluation of patient's response to treatment, examination of patient, obtaining history from patient or surrogate, ordering and performing treatments and interventions, ordering and review of laboratory studies, ordering and review  of radiographic studies, pulse oximetry and re-evaluation of patient's condition.  ____________________________________________   INITIAL IMPRESSION / ASSESSMENT AND PLAN / ED COURSE   69 y.o. female with a history of asthma, COPD on 3 L, hypertension, hyperlipidemia, CVA who presents for evaluation of shortness of breath.  Patient arrives in moderate respiratory distress with audible wheezing, bilateral expiratory and inspiratory wheezing on a nonrebreather after receiving 3 duo nebs and 125 mg of Solu-Medrol in route.  Patient was placed on BiPAP and started on 3 more duo nebs.  IV magnesium ordered due to history of asthma.  Differential diagnosis including COPD exacerbation versus asthma exacerbation versus bronchitis versus pneumonia versus Covid versus pulmonary edema versus CHF versus PE.  Old medical records reviewed.  Patient placed on telemetry for close monitoring.  Labs and imaging pending.  _________________________ 3:15 AM on 02/04/2020 -----------------------------------------  Chest x-ray visualized by me with no infiltrate, confirmed by radiology.  VBG showing hypercapnia with PCO2 of 68 and a pH of 7.29.  Covid and flu negative.  No white count and no fever.  No significant electrolyte derangements.  Normal BNP and negative troponin.  Patient looks markedly improved respiratory wise on BiPAP.  Discussed with Dr. Arville Care for admission peer    _____________________________________________ Please note:  Patient was evaluated in Emergency Department today for the symptoms described in the history of present illness. Patient was evaluated in the context of the global COVID-19 pandemic, which necessitated consideration that the patient might be at risk for infection with the SARS-CoV-2 virus that causes COVID-19. Institutional protocols and algorithms that pertain to the evaluation of patients at risk for COVID-19 are in a state of rapid change based on information released by  regulatory bodies including the CDC and federal and state organizations. These policies and algorithms were followed during the patient's care in the ED.  Some ED evaluations and interventions may be delayed as a result of limited staffing during the pandemic.   [place] Controlled Substance Database was reviewed by me. ____________________________________________   FINAL CLINICAL IMPRESSION(S) / ED DIAGNOSES   Final diagnoses:  Acute on chronic respiratory failure with hypoxia and hypercapnia (HCC)  COPD with acute exacerbation (HCC)      NEW MEDICATIONS STARTED DURING THIS VISIT:  ED Discharge Orders    None       Note:  This document was prepared using Dragon voice recognition software and may include unintentional dictation errors.    Don Perking, [place], MD 02/04/20 [phone]

## 2020-02-04 NOTE — ED Notes (Signed)
RT at bedside to place pt on bipap

## 2020-02-04 NOTE — Progress Notes (Addendum)
   02/04/20 2234  Assess: MEWS Score  Temp 98.3 F (36.8 C)  BP (!) 159/77  Pulse Rate (!) 126  SpO2 92 %  Assess: MEWS Score  MEWS Temp 0  MEWS Systolic 0  MEWS Pulse 2  MEWS RR 0  MEWS LOC 0  MEWS Score 2  MEWS Score Color Yellow  Assess: if the MEWS score is Yellow or Red  Were vital signs taken at a resting state? No (just admitted to floor and tsfered from gurney to bed)  Focused Assessment Documented focused assessment  Early Detection of Sepsis Score *See Row Information* Low  MEWS guidelines implemented *See Row Information* No, previously yellow, continue vital signs every 4 hours  Treat  MEWS Interventions Other (Comment) (pt has been getting albuterol tx in ED; possibly raised HR)  Notify: Charge Nurse/RN  Name of Charge Nurse/RN Notified Lupita Leash, RN  Date Charge Nurse/RN Notified 02/04/20  Time Charge Nurse/RN Notified 2250  pt has been getting albuterol tx in ED for respiratory distress and is possibly causing HR to increase.  She's on floor now and will continue to monitor.  05:05Pt stabilized with rest.

## 2020-02-04 NOTE — ED Triage Notes (Signed)
PT to ED via EMS from home with SOB. PT chronically on 3L with COPD. EMS reports fire reported 80's% sats on 3L, pt arrives with breathing tx in progress. 3 duonebs and 125 of solumedrol given PTA. PT audibly junky and wheezing across room with strong cough.

## 2020-02-04 NOTE — H&P (Addendum)
[facility] [place] NAME: Audrey Sexton    MR#:  [id number]  DATE OF BIRTH:  20-Jan-1951  DATE OF ADMISSION:  02/04/2020  PRIMARY CARE PHYSICIAN: Hamrick, Lorin Mercy, MD   REQUESTING/REFERRING PHYSICIAN: Gonzella Lex, MD CHIEF COMPLAINT:   Chief Complaint  Patient presents with  . Shortness of Breath    HISTORY OF PRESENT ILLNESS:  Audrey Sexton  is a 69 y.o. Caucasian female with a known history of asthma, COPD, hypertension, CVA and dyslipidemia, who presented to the emergency room with acute onset of worsening dyspnea and dry cough as well as wheezing over the last couple of days with associated chest pain with deep breathing or cough without fever or chills.  She denied any nausea or vomiting or abdominal pain.  No dysuria, oliguria or hematuria or flank pain.  The patient to do nebs at home and one by EMS  Upon presentation to the emergency room, blood pressure was 151/77 with a heart rate of 113 and respirate of 25 with oximetry of 90% on 8 L of O2 by facemask and later was placed on BiPAP.  Venous blood gas showed pH 7.29 with bicarbonate of 32.7.  BMP was unremarkable.  BN P was 92.2 and CBC showed mild anemia.  Influenza antigens and COVID-19 PCR came back negative.  Portable chest x-ray COPD with bibasal scarring and/or atelectasis with no acute cardiopulmonary disease. EKG showed atrial fibrillation with rapid response rate of 116 with borderline right axis deviation and low voltage precordial leads with poor R wave progression.  The patient was given two duo nebs here and 2 g of IV magnesium sulfate.  She will be admitted to a stepdown unit for further evaluation and management. PAST MEDICAL HISTORY:   Past Medical History:  Diagnosis Date  . Asthma   . Carpal tunnel syndrome of left wrist   . COPD (chronic obstructive pulmonary disease) ([place])   . Hyperlipemia   . Hypertension   . Stroke [facility])    [place]. 2020    PAST SURGICAL HISTORY:    Past Surgical History:  Procedure Laterality Date  . CARPAL TUNNEL RELEASE Left   . fractured tibia Left    repair  . ROTATOR CUFF REPAIR Left   . TEE WITHOUT CARDIOVERSION N/A 09/09/2018   Procedure: TRANSESOPHAGEAL ECHOCARDIOGRAM (TEE);  Surgeon: Teodoro Spray, MD;  Location: [facility] ORS;  Service: Cardiovascular;  Laterality: N/A;  . TUBAL LIGATION      SOCIAL HISTORY:   Social History   Tobacco Use  . Smoking status: Former Smoker    Packs/day: 1.00    Years: 40.00    Pack years: 40.00    Types: Cigarettes  . Smokeless tobacco: Never Used  Substance Use Topics  . Alcohol use: No    FAMILY HISTORY:   Family History  Problem Relation Age of Onset  . Heart disease Mother        died at 77  . Diabetes Mother   . Alzheimer's disease Father        died at 48  . Parkinson's disease Sister   . Heart disease Brother        died at 61  . Diabetes Brother   . Kidney disease Sister   . Breast cancer Sister     DRUG ALLERGIES:   Allergies  Allergen Reactions  . Percocet [Oxycodone-Acetaminophen] Nausea And Vomiting  . Vicodin [Hydrocodone-Acetaminophen] Nausea And Vomiting  . Penicillins Hives    Has  patient had a PCN reaction causing immediate rash, facial/tongue/throat swelling, SOB or lightheadedness with hypotension: no Has patient had a PCN reaction causing severe rash involving mucus membranes or skin necrosis: no Has patient had a PCN reaction that required hospitalization  no Has patient had a PCN reaction occurring within the last 10 years: no If all of the above answers are "NO", then may proceed with Cephalosporin use.     REVIEW OF SYSTEMS:   ROS As per history of present illness. All pertinent systems were reviewed above. Constitutional,  HEENT, cardiovascular, respiratory, GI, GU, musculoskeletal, neuro, psychiatric, endocrine,  integumentary and hematologic systems were reviewed and are otherwise  negative/unremarkable except for positive  findings mentioned above in the HPI.   MEDICATIONS AT HOME:   Prior to Admission medications   Medication Sig Start Date End Date Taking? Authorizing Provider  albuterol (PROVENTIL HFA;VENTOLIN HFA) 108 (90 Base) MCG/ACT inhaler Inhale 2-4 puffs by mouth every 4 hours as needed for wheezing, cough, and/or shortness of breath 01/01/17  Yes Loleta Rose, MD  Ascorbic Acid (VITAMIN C) 1000 MG tablet Take 1,000 mg by mouth daily.   Yes [provider]  aspirin EC 81 MG EC tablet Take 1 tablet (81 mg total) by mouth daily. 09/12/18  Yes Houston Siren, MD  atorvastatin (LIPITOR) 40 MG tablet Take 40 mg by mouth at bedtime.    Yes [provider]  Cholecalciferol (VITAMIN D-1000 MAX ST) 1000 units tablet Take 1,000 Units by mouth daily.   Yes [provider]  diltiazem (CARDIZEM CD) 240 MG 24 hr capsule Take 240 mg by mouth at bedtime.    Yes [provider]  fluticasone furoate-vilanterol (BREO ELLIPTA) 100-25 MCG/INH AEPB Inhale 1 puff into the lungs daily. 06/14/17  Yes Sudini, Wardell Heath, MD  furosemide (LASIX) 40 MG tablet Take 40 mg by mouth daily.   Yes [provider]  lisinopril-hydrochlorothiazide (ZESTORETIC) 20-12.5 MG tablet Take 1 tablet by mouth daily. 12/29/19  Yes [provider]  Multiple Vitamin (MULTI-VITAMIN DAILY PO) Take 1 tablet by mouth daily.   Yes [provider]  oxybutynin (DITROPAN XL) 15 MG 24 hr tablet Take 15 mg by mouth daily. 02/03/20  Yes [provider]  potassium chloride SA (K-DUR,KLOR-CON) 20 MEQ tablet Take 1 tablet (20 mEq total) by mouth daily. 06/15/17  Yes Sudini, Wardell Heath, MD  traMADol (ULTRAM) 50 MG tablet Take 1 tablet (50 mg total) by mouth every 6 (six) hours as needed. 08/26/19 08/25/20 Yes Jene Every, MD  umeclidinium bromide (INCRUSE ELLIPTA) 62.5 MCG/INH AEPB Inhale 1 puff into the lungs daily.   Yes [provider]  venlafaxine XR (EFFEXOR-XR) 150 MG 24 hr capsule Take 150  mg by mouth at bedtime.  07/28/18  Yes [provider]  vitamin B-12 (CYANOCOBALAMIN) 1000 MCG tablet Take 1,000 mcg by mouth daily.    Yes [provider]      VITAL SIGNS:  Blood pressure (!) 151/77, pulse (!) 113, temperature 98.3 F (36.8 C), resp. rate (!) 25, height 5\' 7"  (1.702 m), weight 90.7 kg, SpO2 98 %.  PHYSICAL EXAMINATION:  Physical Exam  GENERAL:  69 y.o.-year-old Caucasian female patient lying in the bed with mild to moderate respiratory distress on BiPAP. EYES: Pupils equal, round, reactive to light and accommodation. No scleral icterus. Extraocular muscles intact.  HEENT: Head atraumatic, normocephalic. Oropharynx and nasopharynx clear.  NECK:  Supple, no jugular venous distention. No thyroid enlargement, no tenderness.  LUNGS: Diffuse expiratory wheezes  with tight extra airflow and harsh vesicular breathing. CARDIOVASCULAR: Regular rate and rhythm, S1, S2 normal. No murmurs, rubs, or gallops.  ABDOMEN: Soft, nondistended, nontender. Bowel sounds present. No organomegaly or mass.  EXTREMITIES: No pedal edema, cyanosis, or clubbing.  NEUROLOGIC: Cranial nerves II through XII are intact. Muscle strength 5/5 in all extremities. Sensation intact. Gait not checked.  PSYCHIATRIC: The patient is alert and oriented x 3.  Normal affect and good eye contact. SKIN: No obvious rash, lesion, or ulcer.   LABORATORY PANEL:   CBC Recent Labs  Lab 02/04/20 0153  WBC 9.7  HGB 11.1*  HCT 34.6*  PLT 264   ------------------------------------------------------------------------------------------------------------------  Chemistries  Recent Labs  Lab 02/04/20 0153  NA 145  K 3.7  CL 106  CO2 31  GLUCOSE 127*  BUN 16  CREATININE 1.08*  CALCIUM 9.3   ------------------------------------------------------------------------------------------------------------------  Cardiac Enzymes No results for input(s): TROPONINI in the last 168  hours. ------------------------------------------------------------------------------------------------------------------  RADIOLOGY:  DG Chest Portable 1 View  Result Date: 02/04/2020 CLINICAL DATA:  Shortness of breath EXAM: PORTABLE CHEST 1 VIEW COMPARISON:  September 05, 2018 FINDINGS: Coarse lung markings are again noted at the lung bases. There are moderate to severe emphysematous changes. There is no evidence for pneumothorax. There is no significant pleural effusion. The heart size is stable from prior study. Aortic calcifications are noted. There is no acute osseous abnormality. IMPRESSION: 1. No acute cardiopulmonary process. 2. COPD with bibasilar scarring and/or atelectasis. Electronically Signed   By: Katherine Mantle M.D.   On: 02/04/2020 02:26      IMPRESSION AND PLAN:   1.  Severe COPD and asthma exacerbation with subsequent acute hypoxic and hypercarbic respiratory failure.  This is like secondary to acute bronchitis.  -The patient will be admitted to a stepdown unit bed. -We will place the patient on IV steroid therapy with Solu-Medrol. -Duo nebs will be provided on a scheduled and as needed basis. -We will place the patient on IV Rocephin and will add Zithromax. -Mucolytic therapy will be provided. Virgel Bouquet will be held off and will continue Incruse Ellipta.. -Sputum culture will be obtained. -We will continue BiPAP for now overnight and later it can be tapered off to nasal cannula.  2.  Paroxysmal atrial fibrillation with mildly rapid ventricular response.  She converted to normal sinus rhythm with occasional sinus tachycardia. -This could be likely related to her COPD exacerbation. -We will continue Cardizem CD and aspirin.  3.  Hypertension. -We will continue lisinopril HCT  4.  Dyslipidemia. -Statin therapy will be resumed.  5.  DVT prophylaxis. -Subcutaneous Lovenox.   All the records are reviewed and case discussed with ED provider. The plan of care was  discussed in details with the patient (and family). I answered all questions. The patient agreed to proceed with the above mentioned plan. Further management will depend upon hospital course.   CODE STATUS: The patient is DNR/DNI.  This was discussed with her and her daughter.  Status is: Inpatient  Remains inpatient appropriate because:Ongoing diagnostic testing needed not appropriate for outpatient work up, Unsafe d/c plan, IV treatments appropriate due to intensity of illness or inability to take PO and Inpatient level of care appropriate due to severity of illness   Dispo: The patient is from: Home              Anticipated d/c is to: Home              Anticipated d/c date is: 3  days              Patient currently is not medically stable to d/c.       TOTAL TIME TAKING CARE OF THIS PATIENT: 55 minutes.    Hannah Beat M.D on 02/04/2020 at 3:31 AM  [facility]   From 7 PM-7 AM, contact night-coverage [email or web address]  CC: Primary care physician; Hamrick, Durward Fortes, MD   Note: This dictation was prepared with Dragon dictation along with smaller phrase technology. Any transcriptional errors that result from this process are unintentional.

## 2020-02-04 NOTE — ED Notes (Signed)
Pt up to toilet, pt up independently. Pads on bed were changed due to being soiled. New purewick placed.

## 2020-02-04 NOTE — Progress Notes (Signed)
PROGRESS NOTE    Audrey Sexton  WNU:[id number] DOB: 01-27-1951 DOA: 02/04/2020 PCP: Ailene Ravel, MD   Chief Complaint  Patient presents with  . Shortness of Breath    Brief Narrative:  69 year old female with history of asthma, COPD (not on home O2), hypertension, history of CVA presented with acute onset of increasing shortness of breath with nonproductive cough and wheezing for past few days associated with chest discomfort worsened with cough and deep inspiration. In the ED she was in A. fib with heart rate of 113, tachypneic at 25, O2 sat of 90% on 8 L and requiring BiPAP.  Blood pressure was stable.  COVID-19 PCR was negative.  Chest x-ray with COPD findings.  EKG showed A. fib with RVR.  Patient given, duo nebs, IV magnesium and placed on BiPAP.  Assessment & Plan:   Principal problem  acute respiratory failure with hypoxemia (HCC) Secondary to COPD with acute exacerbation.  Required BiPAP overnight and now transition to 3 L via nasal cannula.  Continue IV Solu-Medrol, scheduled duo nebs and antitussives.  On empiric IV Rocephin and azithromycin. Follow sputum culture and wean oxygen as tolerated.  Will discontinue fluids and order a dose of IV Lasix.   Active problems Atrial fibrillation with RVR. Likely triggered by COPD.  Converted soon to sinus rhythm.  Resume aspirin and Cardizem.  I do not see A. fib listed in her medical history.  Her CHA2DS2-VASc score is 29 (age, female, history of stroke and hypertension).  will follow 2D echo and determine need for anticoagulation. Monitor on telemetry.  Hypokalemia/hypomagnesemia Replenished  Stable.   Resume home meds  History of stroke Continue aspirin and statin.   Acute pulmonary edema On oral Lasix at home.  DC IV fluids and ordered a dose of IV Lasix.  Check 2D echo.  DVT prophylaxis: Subcu Lovenox Code Status: DNR Family Communication: We will update daughter. Disposition:   Status is: Inpatient  Remains  inpatient appropriate because:Hemodynamically unstable   Dispo: The patient is from: Home              Anticipated d/c is to: Home              Anticipated d/c date is: 3 days              Patient currently is not medically stable to d/c.       Consultants:   None  Procedures: 2D echo   Antimicrobials: IV Rocephin and Zithromax   Subjective: On BiPAP. reportrs some chesdt tighrtness/. HR in sinus in 90s.   Objective: Vitals:   02/04/20 0500 02/04/20 0600 02/04/20 0700 02/04/20 0800  BP: (!) 155/78 137/85 (!) 146/64 137/73  Pulse: 97 100 95 94  Resp: 18 (!) 23 19 15   Temp:      SpO2: 96% 96% 97% 99%  Weight:      Height:       No intake or output data in the 24 hours ending 02/04/20 0845 Filed Weights   02/04/20 0150  Weight: 90.7 kg    Examination:  General : elderly female on bipap Heent: Supple neck Chest: diminished bilateral breath sounds  CVS: NS1&S2, no murmurs, rubs or gallop GI; soft, mild distention, NT Musculoskeletal: warm 1+ pitting edema b/l   Data Reviewed: I have personally reviewed following labs and imaging studies  CBC: Recent Labs  Lab 02/04/20 0153 02/04/20 0525  WBC 9.7 8.9  NEUTROABS 4.7  --   HGB 11.1* 11.1*  HCT 34.6* 34.9*  MCV 92.5 94.6  PLT 264 008    Basic Metabolic Panel: Recent Labs  Lab 02/04/20 0153 02/04/20 0525  NA 145 143  K 3.7 3.4*  CL 106 108  CO2 31 25  GLUCOSE 127* 236*  BUN 16 16  CREATININE 1.08* 1.06*  CALCIUM 9.3 9.3    GFR: Estimated Creatinine Clearance: 57.1 mL/min (A) (by C-G formula based on SCr of 1.06 mg/dL (H)).  Liver Function Tests: No results for input(s): AST, ALT, ALKPHOS, BILITOT, PROT, ALBUMIN in the last 168 hours.  CBG: No results for input(s): GLUCAP in the last 168 hours.   Recent Results (from the past 240 hour(s))  Respiratory Panel by RT PCR (Flu A&B, Covid) - Nasopharyngeal Swab     Status: None   Collection Time: 02/04/20  1:58 AM   Specimen:  Nasopharyngeal Swab  Result Value Ref Range Status   SARS Coronavirus 2 by RT PCR NEGATIVE NEGATIVE Final    Comment: (NOTE) SARS-CoV-2 target nucleic acids are NOT DETECTED. The SARS-CoV-2 RNA is generally detectable in upper respiratoy specimens during the acute phase of infection. The lowest concentration of SARS-CoV-2 viral copies this assay can detect is 131 copies/mL. A negative result does not preclude SARS-Cov-2 infection and should not be used as the sole basis for treatment or other patient management decisions. A negative result may occur with  improper specimen collection/handling, submission of specimen other than nasopharyngeal swab, presence of viral mutation(s) within the areas targeted by this assay, and inadequate number of viral copies (<131 copies/mL). A negative result must be combined with clinical observations, patient history, and epidemiological information. The expected result is Negative. Fact Sheet for Patients:  PinkCheek.be Fact Sheet for Healthcare Providers:  GravelBags.it This test is not yet ap proved or cleared by the [place] FDA and  has been authorized for detection and/or diagnosis of SARS-CoV-2 by FDA under an Emergency Use Authorization (EUA). This EUA will remain  in effect (meaning this test can be used) for the duration of the COVID-19 declaration under Section 564(b)(1) of the Act, 21 U.S.C. section 360bbb-3(b)(1), unless the authorization is terminated or revoked sooner.    Influenza A by PCR NEGATIVE NEGATIVE Final   Influenza B by PCR NEGATIVE NEGATIVE Final    Comment: (NOTE) The Xpert Xpress SARS-CoV-2/FLU/RSV assay is intended as an aid in  the diagnosis of influenza from Nasopharyngeal swab specimens and  should not be used as a sole basis for treatment. Nasal washings and  aspirates are unacceptable for Xpert Xpress SARS-CoV-2/FLU/RSV  testing. Fact Sheet for  Patients: PinkCheek.be Fact Sheet for Healthcare Providers: GravelBags.it This test is not yet approved or cleared by the [place] FDA and  has been authorized for detection and/or diagnosis of SARS-CoV-2 by  FDA under an Emergency Use Authorization (EUA). This EUA will remain  in effect (meaning this test can be used) for the duration of the  Covid-19 declaration under Section 564(b)(1) of the Act, 21  U.S.C. section 360bbb-3(b)(1), unless the authorization is  terminated or revoked. Performed at [facility], [address], [place], [place] [address]          Radiology Studies: DG Chest Portable 1 View  Result Date: 02/04/2020 CLINICAL DATA:  Shortness of breath EXAM: PORTABLE CHEST 1 VIEW COMPARISON:  September 05, 2018 FINDINGS: Coarse lung markings are again noted at the lung bases. There are moderate to severe emphysematous changes. There is no evidence for pneumothorax. There is no  significant pleural effusion. The heart size is stable from prior study. Aortic calcifications are noted. There is no acute osseous abnormality. IMPRESSION: 1. No acute cardiopulmonary process. 2. COPD with bibasilar scarring and/or atelectasis. Electronically Signed   By: Katherine Mantle M.D.   On: 02/04/2020 02:26        Scheduled Meds: . vitamin C  1,000 mg Oral Daily  . aspirin EC  81 mg Oral Daily  . atorvastatin  40 mg Oral q1800  . cholecalciferol  1,000 Units Oral Daily  . diltiazem  240 mg Oral QHS  . enoxaparin (LOVENOX) injection  40 mg Subcutaneous Q24H  . furosemide  60 mg Intravenous Once  . furosemide  40 mg Oral Daily  . guaiFENesin  600 mg Oral BID  . lisinopril  20 mg Oral Daily   And  . hydrochlorothiazide  12.5 mg Oral Daily  . ipratropium-albuterol  3 mL Nebulization QID  . methylPREDNISolone (SOLU-MEDROL) injection  40 mg Intravenous Q6H   Followed by  . [START ON 02/05/2020] predniSONE  40 mg  Oral Q breakfast  . multivitamin with minerals   Oral Daily  . oxybutynin  15 mg Oral Daily  . potassium chloride SA  40 mEq Oral Daily  . umeclidinium bromide  1 puff Inhalation Daily  . venlafaxine XR  150 mg Oral QHS  . vitamin B-12  1,000 mcg Oral Daily   Continuous Infusions: . azithromycin Stopped (02/04/20 0830)  . cefTRIAXone (ROCEPHIN)  IV Stopped (02/04/20 0604)     LOS: 0 days    Time spent: 35 minutes    Vian Fluegel, MD [facility]   To contact the attending provider between 7A-7P or the covering provider during after hours 7P-7A, please log into the web site [email or web address] and access using universal [place] password for that web site. If you do not have the password, please call the hospital operator.  02/04/2020, 8:45 AM

## 2020-02-04 NOTE — Progress Notes (Signed)
*  PRELIMINARY RESULTS* Echocardiogram 2D Echocardiogram has been performed.  Joanette Gula Gaelle Adriance 02/04/2020, 11:18 AM

## 2020-02-04 NOTE — Evaluation (Signed)
Occupational Therapy Evaluation Patient Details Name: Audrey Sexton MRN: [id number] DOB: 1950-12-16 Today's Date: 02/04/2020    History of Present Illness Audrey Sexton  is a 69 y.o. Caucasian female with a known history of asthma, COPD, hypertension, CVA and dyslipidemia, who presented to the emergency room with acute onset of worsening dyspnea and dry cough as well as wheezing over the last couple of days with associated chest pain with deep breathing or cough without fever or chills.   Clinical Impression   Audrey Sexton was seen for OT evaluation this date. Pt was independent in all ADL and functional mobility, living in a 1 level mobilie home with 5 steps to enter and bilateral hand rails. Pt on 3 liters of O2 at home. Pt reports becoming easily fatigued or out of breath with minimal exertion. Pt currently requires MIN A for exertional ADL tasks including bathing and LB dressing due to current functional impairments (See OT Problem List below). Pt educated in energy conservation strategies including pursed lip breathing, activity pacing, home/routines modifications, work simplification, AE/DME, prioritizing of meaningful occupations, safe transfer techniques, and falls prevention. Pt verbalized understanding and would benefit from additional skilled OT services to maximize recall and carryover of learned techniques and facilitate implementation of learned techniques into daily routines. Upon discharge, recommend HHOT services.       Follow Up Recommendations  Home health OT;Supervision - Intermittent    Equipment Recommendations  None recommended by OT(Pt has necessary equipment)    Recommendations for Other Services       Precautions / Restrictions Precautions Precautions: Fall Precaution Comments: High Fall Restrictions Weight Bearing Restrictions: No      Mobility Bed Mobility Overal bed mobility: Modified Independent             General bed mobility comments: Pt comes  to sitting EOB w/o physical assist. HOB elevated.  Transfers                      Balance Overall balance assessment: Needs assistance Sitting-balance support: Feet unsupported;Single extremity supported Sitting balance-Leahy Scale: Good Sitting balance - Comments: Generally maintains 1 UE support for sitting EOB w/o feet support on gurney bed.                                   ADL either performed or assessed with clinical judgement   ADL Overall ADL's : Needs assistance/impaired                                       General ADL Comments: Pt is functionally limited by cardiopulmonary stats, decreased activity tolerance, and generalized weakness. Requires supervision for safety with functional mobility and toileting. MIN A for exertional ADL management including bathing and dressing in a seated position to maximize safety.     Vision Baseline Vision/History: Wears glasses Patient Visual Report: No change from baseline       Perception     Praxis      Pertinent Vitals/Pain Pain Assessment: No/denies pain     Hand Dominance Right   Extremity/Trunk Assessment Upper Extremity Assessment Upper Extremity Assessment: Generalized weakness   Lower Extremity Assessment Lower Extremity Assessment: Generalized weakness   Cervical / Trunk Assessment Cervical / Trunk Assessment: Normal   Communication Communication Communication: No difficulties   Cognition Arousal/Alertness: Awake/alert Behavior  During Therapy: WFL for tasks assessed/performed Overall Cognitive Status: Within Functional Limits for tasks assessed                                     General Comments  Pt vitals monitored t/o session. HR reaches 127 with sitting EOB, further mobility deferred. Pt on 3L [place] t/o session spO2 remains WFLs >92% with mobiliyt, however pt notably short of breath with exertion. Requires consistent cueing for PLB and therapeutic rest  breaks.    Exercises Other Exercises Other Exercises: Pt eduated on falls prevention strategies, energy conservation techniques, and safe transfer techniques for home including shower transfer techniques to support safety and functional independence with ADL management upon hospital DC.   Shoulder Instructions      Home Living Family/patient expects to be discharged to:: Private residence Living Arrangements: Alone Available Help at Discharge: Family;Available 24 hours/day(Dtr lives next door.) Type of Home: Mobile home Home Access: Stairs to enter [employer] of Steps: 4-5 Entrance Stairs-Rails: Right;Left;Can reach both Home Layout: One level         Bathroom Toilet: Standard(BSC over toilet)     Home Equipment: Bedside commode;Walker - 2 wheels;Shower seat          Prior Functioning/Environment Level of Independence: Needs assistance  Gait / Transfers Assistance Needed: Pt uses 2WW for mobility out doors. ADL's / Homemaking Assistance Needed: Pt reports she requires increased assistance from family for bathing 2/2 fear of falling and decreased functional use of her RUE 2/2 CVA.   Comments: 3L O2 at baseline. Denies falls.        OT Problem List: Cardiopulmonary status limiting activity;Decreased activity tolerance;Decreased safety awareness;Impaired balance (sitting and/or standing);Decreased coordination;Decreased strength;Impaired UE functional use;Decreased knowledge of use of DME or AE;Increased edema      OT Treatment/Interventions: Self-care/ADL training;Therapeutic activities;Therapeutic exercise;DME and/or AE instruction;Patient/family education;Energy conservation;Balance training    OT Goals(Current goals can be found in the care plan section) Acute Rehab OT Goals Patient Stated Goal: To be as independent as I can. OT Goal Formulation: With patient Time For Goal Achievement: 02/18/20 Potential to Achieve Goals: Good ADL Goals Pt Will Perform  Grooming: with modified independence;sitting Pt Will Perform Upper Body Dressing: sitting;with modified independence Pt Will Perform Lower Body Dressing: sit to/from stand;with adaptive equipment;with modified independence(c LRAD PRN for improved safety and fxl independence.) Pt Will Transfer to Toilet: ambulating;with modified independence(c LRAD PRN for improved safety and fxl independence.) Pt Will Perform Toileting - Clothing Manipulation and hygiene: sit to/from stand;with modified independence;with adaptive equipment(c LRAD PRN for improved safety and fxl independence.)  OT Frequency: Min 1X/week   Barriers to D/C: Inaccessible home environment  Pt with multiple steps to enter the home.       Co-evaluation              AM-PAC OT "6 Clicks" Daily Activity     Outcome Measure Help from another person eating meals?: None Help from another person taking care of personal grooming?: A Little Help from another person toileting, which includes using toliet, bedpan, or urinal?: A Little Help from another person bathing (including washing, rinsing, drying)?: A Little Help from another person to put on and taking off regular upper body clothing?: None Help from another person to put on and taking off regular lower body clothing?: A Little 6 Click Score: 20   End of Session    Activity  Tolerance: Patient tolerated treatment well Patient left: in bed;with call bell/phone within reach;Other (comment)(on 3L [place])  OT Visit Diagnosis: Other abnormalities of gait and mobility (R26.89);Muscle weakness (generalized) (M62.81)                Time: 8022-3361 OT Time Calculation (min): 31 min Charges:  OT General Charges $OT Visit: 1 Visit OT Evaluation $OT Eval Moderate Complexity: 1 Mod OT Treatments $Self Care/Home Management : 23-37 mins  Rockney Ghee, M.S., OTR/L Ascom: [phone] 02/04/20, 4:29 PM

## 2020-02-04 NOTE — ED Notes (Signed)
Pt A&Ox4. Pt currently on 3Ls [place]. She reports breathing has improve,d but she continues to have a "dry cough that irritates throat". Pt c/o chronic right knee pain, but refuses medication at this time. on3Ls . NAD noted. Call bell in reach.

## 2020-02-04 NOTE — ED Notes (Signed)
PT briefly taken off bipap to be given sips of drink. Tolerated well.

## 2020-02-05 DIAGNOSIS — R000 Tachycardia, unspecified: Secondary | ICD-10-CM

## 2020-02-05 LAB — BASIC METABOLIC PANEL
Anion gap: 11 (ref 5–15)
BUN: 25 mg/dL — ABNORMAL HIGH (ref 8–23)
CO2: 26 mmol/L (ref 22–32)
Calcium: 9.5 mg/dL (ref 8.9–10.3)
Chloride: 99 mmol/L (ref 98–111)
Creatinine, Ser: 1.14 mg/dL — ABNORMAL HIGH (ref 0.44–1.00)
GFR calc Af Amer: 56 mL/min — ABNORMAL LOW (ref >60)
GFR calc non Af Amer: 49 mL/min — ABNORMAL LOW (ref >60)
Glucose, Bld: 183 mg/dL — ABNORMAL HIGH (ref 70–99)
Potassium: 4.5 mmol/L (ref 3.5–5.1)
Sodium: 136 mmol/L (ref 135–145)

## 2020-02-05 MED ORDER — SODIUM CHLORIDE 0.9 % IV SOLN
INTRAVENOUS | Status: DC | PRN
  Administered 2020-02-05: 04:00:00 250 mL via INTRAVENOUS

## 2020-02-05 MED ORDER — AZITHROMYCIN 500 MG PO TABS
500.0000 mg | ORAL_TABLET | Freq: Every day | ORAL | Status: DC
  Administered 2020-02-06 – 2020-02-08 (×3): 500 mg via ORAL
  Filled 2020-02-05 (×3): qty 1

## 2020-02-05 MED ORDER — DILTIAZEM HCL ER COATED BEADS 300 MG PO CP24
300.0000 mg | ORAL_CAPSULE | Freq: Every day | ORAL | Status: DC
  Administered 2020-02-05 – 2020-02-07 (×3): 300 mg via ORAL
  Filled 2020-02-05 (×4): qty 1

## 2020-02-05 NOTE — Progress Notes (Signed)
Nutrition Brief Note  RD received consult for assessment of nutrition requirements/status per COPD protocol  Wt Readings from Last 15 Encounters:  02/04/20 90.7 kg  08/26/19 91.6 kg  09/09/18 90.4 kg  08/02/18 93.4 kg  06/11/17 95.8 kg  01/01/17 95.3 kg  08/17/16 95.3 kg  12/07/15 2.71 kg   69 year old female with PMHx of COPD, HTN, HLD, asthma, hx CVA admitted with COPD exacerbation, A-fib with RVR.  Met with patient at bedside. She reports she has a good appetite and intake at baseline. She is also eating 100% of her meals here. Patient reports she gets adequate protein at her meals. Patient reports she has gained approximately 7 lbs over the past 6 months to one year as her UBW was 193 lbs and she is now up to 200 lbs. She is requesting general heart-healthy diet education. She also has poor dentition and reports she needs a mechanical soft diet. Patient does not meet criteria for malnutrition at this time.  Nutrition-Focused Physical Exam:    Most Recent Value  Orbital Region No depletion  Upper Arm Region No depletion  Thoracic and Lumbar Region No depletion  Buccal Region No depletion  Temple Region No depletion  Clavicle Bone Region No depletion  Clavicle and Acromion Bone Region No depletion  Scapular Bone Region No depletion  Dorsal Hand Mild depletion  Patellar Region No depletion  Anterior Thigh Region No depletion  Posterior Calf Region No depletion  Edema (RD Assessment) Mild  Hair Reviewed  Eyes Reviewed  Mouth Reviewed  [poor dentition]  Skin Reviewed  Nails Reviewed     Body mass index is 31.32 kg/m. Patient meets criteria for obesity class I based on current BMI.   Current diet order is heart healthy, patient is consuming approximately 100% of meals at this time. Labs and medications reviewed.   Provided diet education per patient request (note to follow) and will downgrade diet to mechanical soft per patient request. No further nutrition interventions  warranted at this time. If nutrition issues arise, please consult RD.   Jacklynn Barnacle, MS, RD, LDN Pager number available on Amion

## 2020-02-05 NOTE — Evaluation (Signed)
Physical Therapy Evaluation Patient Details Name: TINE MABEE MRN: [id number] DOB: 12-02-1950 Today's Date: 02/05/2020   History of Present Illness  Patient is a 69 y.o. Caucasian female with a known history of asthma, COPD, hypertension, CVA and dyslipidemia, who presented to the emergency room with acute onset of worsening dyspnea and dry cough as well as wheezing over the last couple of days with associated chest pain with deep breathing or cough without fever or chills.  Clinical Impression  Patient is pleasant and cooperative. Patient lives at home alone and ambulates with a rolling walker limited distance inside her home and is fatigued quickly at baseline. Today, patient required CGA for safety with transfers and ambulation. Patient ambulated 53ft with rolling walker in room but required 3 short standing rest breaks due to fatigue and shortness of breath. Sp02 decreased intermittently to 85% on 3 L02. Recommend PT to maximize function and safety to facilitate independence in preparation for returning home. HHPT recommended.     Follow Up Recommendations Home health PT    Equipment Recommendations  None recommended by PT    Recommendations for Other Services       Precautions / Restrictions Precautions Precautions: Fall Restrictions Weight Bearing Restrictions: No      Mobility  Bed Mobility               General bed mobility comments: not assessed at patient sitting up on arrival and post session   Transfers Overall transfer level: Needs assistance Equipment used: Rolling walker (2 wheeled) Transfers: Sit to/from Stand Sit to Stand: Min guard         General transfer comment: CGA for safety   Ambulation/Gait Ambulation/Gait assistance: Min guard Gait Distance (Feet): 18 Feet Assistive device: Rolling walker (2 wheeled) Gait Pattern/deviations: Decreased stance time - right (patient reports chronic right leg pain with ambulation ) Gait velocity:  decreased with multiple standing rest breaks required    General Gait Details: Sp02 decreased to 85% for brief period with activity. patient recovers to 90% with short standing rest break   Stairs            Wheelchair Mobility    Modified Rankin (Stroke Patients Only)       Balance Overall balance assessment: Needs assistance Sitting-balance support: Feet unsupported;Single extremity supported Sitting balance-Leahy Scale: Good Sitting balance - Comments: patient uses LUE for supprt while sitting on edge of bed without loss of balance    Standing balance support: Bilateral upper extremity supported Standing balance-Leahy Scale: Fair Standing balance comment: rolling walker for support                              Pertinent Vitals/Pain Pain Assessment: No/denies pain    Home Living Family/patient expects to be discharged to:: Private residence Living Arrangements: Alone Available Help at Discharge: Family;Available 24 hours/day Type of Home: Mobile home Home Access: Stairs to enter Entrance Stairs-Rails: Right;Left;Can reach both Entrance Stairs-Number of Steps: 4-5 Home Layout: One level Home Equipment: Bedside commode;Walker - 2 wheels;Shower seat Additional Comments: 3 Lo2 at baseline     Prior Function Level of Independence: Needs assistance   Gait / Transfers Assistance Needed: patient uses rolling walker inside and rollator outside. limited household ambulator            Hand Dominance   Dominant Hand: Right    Extremity/Trunk Assessment   Upper Extremity Assessment Upper Extremity Assessment: Generalized weakness  Lower Extremity Assessment Lower Extremity Assessment: Generalized weakness (endurance impaired for sustained activity )       Communication   Communication: No difficulties  Cognition Arousal/Alertness: Awake/alert Behavior During Therapy: WFL for tasks assessed/performed Overall Cognitive Status: Within Functional  Limits for tasks assessed                                        General Comments      Exercises     Assessment/Plan    PT Assessment Patient needs continued PT services  PT Problem List Decreased strength;Decreased activity tolerance;Decreased balance;Decreased mobility;Decreased safety awareness       PT Treatment Interventions DME instruction;Gait training;Stair training;Functional mobility training;Balance training;Therapeutic exercise;Therapeutic activities;Neuromuscular re-education    PT Goals (Current goals can be found in the Care Plan section)  Acute Rehab PT Goals Patient Stated Goal: to go home  PT Goal Formulation: With patient Time For Goal Achievement: 02/19/20 Potential to Achieve Goals: Good    Frequency Min 2X/week   Barriers to discharge        Co-evaluation               AM-PAC PT "6 Clicks" Mobility  Outcome Measure Help needed turning from your back to your side while in a flat bed without using bedrails?: A Little Help needed moving from lying on your back to sitting on the side of a flat bed without using bedrails?: A Little Help needed moving to and from a bed to a chair (including a wheelchair)?: A Little Help needed standing up from a chair using your arms (e.g., wheelchair or bedside chair)?: A Little Help needed to walk in hospital room?: A Little Help needed climbing 3-5 steps with a railing? : A Little 6 Click Score: 18    End of Session   Activity Tolerance: Patient limited by fatigue Patient left: in bed;with call bell/phone within reach Nurse Communication: Mobility status PT Visit Diagnosis: Muscle weakness (generalized) (M62.81)    Time: 6440-3474 PT Time Calculation (min) (ACUTE ONLY): 24 min   Charges:   PT Evaluation $PT Eval Low Complexity: 1 Low PT Treatments $Therapeutic Activity: 8-22 mins        Minna Merritts, PT, MPT   Percell Locus 02/05/2020, 10:31 AM

## 2020-02-05 NOTE — Progress Notes (Signed)
PROGRESS NOTE    Audrey Sexton  KXF:[id number] DOB: [birth year] DOA: 02/04/2020 PCP: Ailene Ravel, MD   Chief Complaint  Patient presents with  . Shortness of Breath    Brief Narrative:  69 year old female with history of asthma, COPD (not on home O2), hypertension, history of CVA presented with acute onset of increasing shortness of breath with nonproductive cough and wheezing for past few days associated with chest discomfort worsened with cough and deep inspiration. In the ED she was in A. fib with heart rate of 113, tachypneic at 25, O2 sat of 90% on 8 L and requiring BiPAP.  Blood pressure was stable.  COVID-19 PCR was negative.  Chest x-ray with COPD findings.  EKG showed A. fib with RVR.  Patient given, duo nebs, IV magnesium and placed on BiPAP.  Assessment & Plan:   Principal problem  acute respiratory failure with hypoxemia (HCC) Secondary to COPD with acute exacerbation.  Maintaining sats on 3 L via nasal cannula.  Still wheezing with dyspnea on exertion. Continue IV Solu-Medrol, scheduled duo nebs and antitussives.  On empiric IV Rocephin and azithromycin.    Active problems  ? A. fib on admission Reviewed admission EKG and patient has sinus tachycardia and no Afib.  Has been in sinus past 24 hours on the monitor.   Will monitor on telemetry for now.  Hypokalemia/hypomagnesemia Replenished   History of stroke Continue aspirin and statin.   Acute pulmonary edema On oral Lasix at home which was resumed.  2D echo with grade 1 diastolic dysfunction and normal EF.  Currently euvolemic.  Monitor I/Os.  Sinus tachycardia Will increase Cardizem dose to 300 mg daily.  Essential hypertension Continue lisinopril and HCTZ.   DVT prophylaxis: Subcu Lovenox Code Status: DNR Family Communication: We will update daughter. Disposition:   Status is: Inpatient  Remains inpatient appropriate because:Hemodynamically unstable and Inpatient level of care appropriate  due to severity of illness   Dispo: The patient is from: Home              Anticipated d/c is to: Home              Anticipated d/c date is: 2 days              Patient currently is not medically stable to d/c.       Consultants:   None  Procedures: 2D echo   Antimicrobials: IV Rocephin and Zithromax   Subjective: Maintaining sats on 3-4 L.  Breathing better while at rest but becomes tachycardic and dyspneic on minimal exertion.  Objective: Vitals:   02/05/20 0018 02/05/20 0502 02/05/20 0738 02/05/20 1300  BP: (!) 146/75  (!) 138/111   Pulse: (!) 111 (!) 106 (!) 106 99  Resp: 16  18 (!) 22  Temp: 98.2 F (36.8 C)  97.6 F (36.4 C)   TempSrc: Oral  Oral   SpO2: 95%  92% 94%  Weight:      Height:        Intake/Output Summary (Last 24 hours) at 02/05/2020 1429 Last data filed at 02/05/2020 1121 Gross per 24 hour  Intake 650.24 ml  Output 601 ml  Net 49.24 ml   Filed Weights   02/04/20 0150  Weight: 90.7 kg   Physical exam General: Elderly female fatigued, not in distress HEENT: Moist mucosa, supple neck Chest: Scattered rhonchi bilaterally CVs: S1-S2 tachycardic, no murmurs rub or gallop GI: Soft, nondistended, nontender Musculoskeletal: Warm, no edema  Data Reviewed: I have personally reviewed following labs and imaging studies  CBC: Recent Labs  Lab 02/04/20 0153 02/04/20 0525  WBC 9.7 8.9  NEUTROABS 4.7  --   HGB 11.1* 11.1*  HCT 34.6* 34.9*  MCV 92.5 94.6  PLT 264 233    Basic Metabolic Panel: Recent Labs  Lab 02/04/20 0153 02/04/20 0525 02/05/20 0521  NA 145 143 136  K 3.7 3.4* 4.5  CL 106 108 99  CO2 31 25 26   GLUCOSE 127* 236* 183*  BUN 16 16 25*  CREATININE 1.08* 1.06* 1.14*  CALCIUM 9.3 9.3 9.5    GFR: Estimated Creatinine Clearance: 53.1 mL/min (A) (by C-G formula based on SCr of 1.14 mg/dL (H)).  Liver Function Tests: No results for input(s): AST, ALT, ALKPHOS, BILITOT, PROT, ALBUMIN in the last 168  hours.  CBG: No results for input(s): GLUCAP in the last 168 hours.   Recent Results (from the past 240 hour(s))  Respiratory Panel by RT PCR (Flu A&B, Covid) - Nasopharyngeal Swab     Status: None   Collection Time: 02/04/20  1:58 AM   Specimen: Nasopharyngeal Swab  Result Value Ref Range Status   SARS Coronavirus 2 by RT PCR NEGATIVE NEGATIVE Final    Comment: (NOTE) SARS-CoV-2 target nucleic acids are NOT DETECTED. The SARS-CoV-2 RNA is generally detectable in upper respiratoy specimens during the acute phase of infection. The lowest concentration of SARS-CoV-2 viral copies this assay can detect is 131 copies/mL. A negative result does not preclude SARS-Cov-2 infection and should not be used as the sole basis for treatment or other patient management decisions. A negative result may occur with  improper specimen collection/handling, submission of specimen other than nasopharyngeal swab, presence of viral mutation(s) within the areas targeted by this assay, and inadequate number of viral copies (<131 copies/mL). A negative result must be combined with clinical observations, patient history, and epidemiological information. The expected result is Negative. Fact Sheet for Patients:  https://www.moore.com/https://www.fda.gov/media/142436/download Fact Sheet for Healthcare Providers:  https://www.young.biz/https://www.fda.gov/media/142435/download This test is not yet ap proved or cleared by the [place] FDA and  has been authorized for detection and/or diagnosis of SARS-CoV-2 by FDA under an Emergency Use Authorization (EUA). This EUA will remain  in effect (meaning this test can be used) for the duration of the COVID-19 declaration under Section 564(b)(1) of the Act, 21 U.S.C. section 360bbb-3(b)(1), unless the authorization is terminated or revoked sooner.    Influenza A by PCR NEGATIVE NEGATIVE Final   Influenza B by PCR NEGATIVE NEGATIVE Final    Comment: (NOTE) The Xpert Xpress SARS-CoV-2/FLU/RSV assay is  intended as an aid in  the diagnosis of influenza from Nasopharyngeal swab specimens and  should not be used as a sole basis for treatment. Nasal washings and  aspirates are unacceptable for Xpert Xpress SARS-CoV-2/FLU/RSV  testing. Fact Sheet for Patients: https://www.moore.com/https://www.fda.gov/media/142436/download Fact Sheet for Healthcare Providers: https://www.young.biz/https://www.fda.gov/media/142435/download This test is not yet approved or cleared by the [place] FDA and  has been authorized for detection and/or diagnosis of SARS-CoV-2 by  FDA under an Emergency Use Authorization (EUA). This EUA will remain  in effect (meaning this test can be used) for the duration of the  Covid-19 declaration under Section 564(b)(1) of the Act, 21  U.S.C. section 360bbb-3(b)(1), unless the authorization is  terminated or revoked. Performed at [facility], [address] [place], [place] [address]          Radiology Studies: DG Chest Portable 1 View  Result Date: 02/04/2020 CLINICAL DATA:  Shortness of breath EXAM: PORTABLE CHEST 1 VIEW COMPARISON:  September 05, 2018 FINDINGS: Coarse lung markings are again noted at the lung bases. There are moderate to severe emphysematous changes. There is no evidence for pneumothorax. There is no significant pleural effusion. The heart size is stable from prior study. Aortic calcifications are noted. There is no acute osseous abnormality. IMPRESSION: 1. No acute cardiopulmonary process. 2. COPD with bibasilar scarring and/or atelectasis. Electronically Signed   By: Katherine Mantle M.D.   On: 02/04/2020 02:26   ECHOCARDIOGRAM COMPLETE  Result Date: 02/04/2020    ECHOCARDIOGRAM REPORT   Patient Name:   BURNIE HANK Date of Exam: 02/04/2020 Medical Rec #:  [id number]       Height:       67.0 in Accession #:    [id number]      Weight:       200.0 lb Date of Birth:  08/22/1951      BSA:          2.022 m Patient Age:    68 years        BP:           147/74 mmHg Patient Gender: F                HR:           112 bpm. Exam Location:  [facility] Procedure: 2D Echo, Color Doppler, Cardiac Doppler and Intracardiac            Opacification Agent Indications:     I50.31 CHF-Acute Diastolic; I48.91 Atrial Fibrillation  History:         Patient has prior history of Echocardiogram examinations.                  Stroke and COPD; Risk Factors:Hypertension and Dyslipidemia.  Sonographer:     Humphrey Rolls RDCS (AE) Referring Phys:  Ranelle Oyster [facility] Diagnosing Phys: Debbe Odea MD  Sonographer Comments: Suboptimal parasternal window. IMPRESSIONS  1. Left ventricular ejection fraction, by estimation, is 65 to 70%. The left ventricle has normal function. The left ventricle has no regional wall motion abnormalities. Left ventricular diastolic parameters are consistent with Grade I diastolic dysfunction (impaired relaxation).  2. Right ventricular systolic function is normal. The right ventricular size is normal.  3. The mitral valve is normal in structure. No evidence of mitral valve regurgitation. No evidence of mitral stenosis.  4. The aortic valve was not well visualized. Aortic valve regurgitation is not visualized. No aortic stenosis is present.  5. Pulmonic valve regurgitation not assessed.  6. The inferior vena cava is normal in size with greater than 50% respiratory variability, suggesting right atrial pressure of 3 mmHg. FINDINGS  Left Ventricle: Left ventricular ejection fraction, by estimation, is 65 to 70%. The left ventricle has normal function. The left ventricle has no regional wall motion abnormalities. Definity contrast agent was given IV to delineate the left ventricular  endocardial borders. The left ventricular internal cavity size was normal in size. There is no left ventricular hypertrophy. Left ventricular diastolic parameters are consistent with Grade I diastolic dysfunction (impaired relaxation). Right Ventricle: The right ventricular size is normal. No increase in right ventricular wall  thickness. Right ventricular systolic function is normal. Left Atrium: Left atrial size was normal in size. Right Atrium: Right atrial size was normal in size. Pericardium: There is no evidence of pericardial effusion. Mitral Valve: The mitral valve is normal in structure. Normal  mobility of the mitral valve leaflets. No evidence of mitral valve regurgitation. No evidence of mitral valve stenosis. MV peak gradient, 5.1 mmHg. The mean mitral valve gradient is 3.0 mmHg. Tricuspid Valve: The tricuspid valve is not well visualized. Tricuspid valve regurgitation is not demonstrated. No evidence of tricuspid stenosis. Aortic Valve: The aortic valve was not well visualized. Aortic valve regurgitation is not visualized. No aortic stenosis is present. Aortic valve mean gradient measures 5.0 mmHg. Aortic valve peak gradient measures 10.4 mmHg. Aortic valve area, by VTI measures 2.88 cm. Pulmonic Valve: The pulmonic valve was not well visualized. Pulmonic valve regurgitation not assessed. No evidence of pulmonic stenosis. Aorta: The aortic root is normal in size and structure. Venous: The inferior vena cava is normal in size with greater than 50% respiratory variability, suggesting right atrial pressure of 3 mmHg. IAS/Shunts: No atrial level shunt detected by color flow Doppler.  LEFT VENTRICLE PLAX 2D LVOT diam:     2.20 cm     Diastology LV SV:         63          LV e' lateral:   5.22 cm/s LV SV Index:   31          LV E/e' lateral: 11.0 LVOT Area:     3.80 cm    LV e' medial:    8.38 cm/s                            LV E/e' medial:  6.8  LV Volumes (MOD) LV vol d, MOD A4C: 78.9 ml LV vol s, MOD A4C: 26.9 ml LV SV MOD A4C:     78.9 ml RIGHT VENTRICLE RV Basal diam:  2.86 cm LEFT ATRIUM           Index      RIGHT ATRIUM          Index LA Vol (A4C): 15.2 ml 7.52 ml/m RA Area:     6.57 cm                                  RA Volume:   10.20 ml 5.04 ml/m  AORTIC VALVE AV Area (Vmax):    2.38 cm AV Area (Vmean):   2.32 cm  AV Area (VTI):     2.88 cm AV Vmax:           161.00 cm/s AV Vmean:          102.000 cm/s AV VTI:            0.218 m AV Peak Grad:      10.4 mmHg AV Mean Grad:      5.0 mmHg LVOT Vmax:         101.00 cm/s LVOT Vmean:        62.200 cm/s LVOT VTI:          0.165 m LVOT/AV VTI ratio: 0.76  AORTA Ao Root diam: 2.60 cm MITRAL VALVE MV Area (PHT): 6.71 cm    SHUNTS MV Peak grad:  5.1 mmHg    Systemic VTI:  0.16 m MV Mean grad:  3.0 mmHg    Systemic Diam: 2.20 cm MV Vmax:       1.13 m/s MV Vmean:      81.2 cm/s MV Decel Time: 113 msec MV E velocity: 57.20 cm/s MV A velocity: 85.40 cm/s  MV E/A ratio:  0.67 Kate Sable MD Electronically signed by Kate Sable MD Signature Date/Time: 02/04/2020/4:16:35 PM    Final         Scheduled Meds: . vitamin C  1,000 mg Oral Daily  . aspirin EC  81 mg Oral Daily  . atorvastatin  40 mg Oral q1800  . [START ON 02/06/2020] azithromycin  500 mg Oral Daily  . cholecalciferol  1,000 Units Oral Daily  . diltiazem  300 mg Oral QHS  . enoxaparin (LOVENOX) injection  40 mg Subcutaneous Q24H  . furosemide  40 mg Oral Daily  . guaiFENesin  600 mg Oral BID  . lisinopril  20 mg Oral Daily   And  . hydrochlorothiazide  12.5 mg Oral Daily  . ipratropium-albuterol  3 mL Nebulization QID  . methylPREDNISolone (SOLU-MEDROL) injection  60 mg Intravenous Q8H  . multivitamin with minerals   Oral Daily  . oxybutynin  15 mg Oral Daily  . potassium chloride SA  40 mEq Oral Daily  . umeclidinium bromide  1 puff Inhalation Daily  . venlafaxine XR  150 mg Oral QHS  . vitamin B-12  1,000 mcg Oral Daily   Continuous Infusions: . sodium chloride 10 mL/hr at 02/05/20 1121  . cefTRIAXone (ROCEPHIN)  IV Stopped (02/05/20 0445)     LOS: 1 day    Time spent: 35 minutes    Finola Rosal, MD [facility]   To contact the attending provider between 7A-7P or the covering provider during after hours 7P-7A, please log into the web site [email or web address] and access using  universal [place] password for that web site. If you do not have the password, please call the hospital operator.  02/05/2020, 2:29 PM

## 2020-02-05 NOTE — TOC Initial Note (Signed)
Transition of Care [facility]) - Initial/Assessment Note    Patient Details  Name: Audrey Sexton MRN: [id number] Date of Birth: 1950-11-03  [facility]) CM/SW Contact:    Shelbie Hutching, RN Phone Number: 02/05/2020, 3:34 PM  Clinical Narrative:                 Patient admitted for COPD exacerbation on chronic oxygen at 3 L [place] at home.  Oxygen is through [place].  Patient lives alone and walks with a walker, also has a rollator..  Patient needs a nebulizer to use at home, she reports she has been using her grandson's nebulizer, MD aware and will order one for home.  Zack Blank given the referral for the nebulizer and will get it to the patient's room before discharge.  Patient also agrees to home health services, she would like to use [facility].  Jana Half with [facility] accepts the referral for RN, PT, OT, and aide.  Patient's daughter, Phineas Real is the healthcare power of attorney and lives next to the patient in [place].  Patient does not drive, daughter Phineas Real provides her transportation.  Patient is current with her PCP, Dr. Dirk Dress in [place].   RNCM will cont to follow for additional needs.   Expected Discharge Plan: [place] Barriers to Discharge: Continued Medical Work up   Patient Goals and CMS Choice Patient states their goals for this hospitalization and ongoing recovery are:: wants to get better and go home CMS Medicare.gov Compare Post Acute Care list provided to:: Patient Choice offered to / list presented to : Patient  Expected Discharge Plan and Services Expected Discharge Plan: [place]   Discharge Planning Services: CM Consult Post Acute Care Choice: Home Health, Durable Medical Equipment Living arrangements for the past 2 months: Single Family Home                 DME Arranged: Nebulizer machine DME Agency: [employer]       HH Arranged: RN, PT, OT, Nurse's Aide [place] Agency: [facility] Date [place] Agency  Contacted: 02/05/20 Time [place]: [id number] Representative spoke with at [place]: Jana Half  Prior Living Arrangements/Services Living arrangements for the past 2 months: [place] with:: Self Patient language and need for interpreter reviewed:: Yes Do you feel safe going back to the place where you live?: Yes      Need for Family Participation in Patient Care: Yes (Comment) (COPD) Care giver support system in place?: Yes (comment) (daughter) Current home services: DME (oxygen, rolling walker, rollator) Criminal Activity/Legal Involvement Pertinent to Current Situation/Hospitalization: No - Comment as needed  Activities of Daily Living Home Assistive Devices/Equipment: Eyeglasses, Oxygen ADL Screening (condition at time of admission) Patient's cognitive ability adequate to safely complete daily activities?: No Is the patient deaf or have difficulty hearing?: No Does the patient have difficulty seeing, even when wearing glasses/contacts?: Yes Does the patient have difficulty concentrating, remembering, or making decisions?: No Patient able to express need for assistance with ADLs?: Yes Does the patient have difficulty dressing or bathing?: Yes Independently performs ADLs?: Yes (appropriate for developmental age) Does the patient have difficulty walking or climbing stairs?: No Weakness of Legs: None Weakness of Arms/Hands: None  Permission Sought/Granted Permission sought to share information with : Case Manager, Other (comment), Family Supports Permission granted to share information with : Yes, Verbal Permission Granted  Share Information with NAME: Phineas Real  Permission granted to share  info w AGENCYRolene Arbour  Permission granted to share info w Relationship: daughter     Emotional Assessment Appearance:: Appears stated age Attitude/Demeanor/Rapport: Engaged Affect (typically observed): Accepting Orientation: : Oriented to Self, Oriented to Place,  Oriented to  Time, Oriented to Situation Alcohol / Substance Use: Not Applicable Psych Involvement: No (comment)  Admission diagnosis:  Acute respiratory failure (HCC) [J96.00] Acute respiratory failure with hypoxia (HCC) [J96.01] COPD with acute exacerbation (HCC) [J44.1] Acute on chronic respiratory failure with hypoxia and hypercapnia (HCC) [W41.32, J96.22] Patient Active Problem List   Diagnosis Date Noted   Acute respiratory failure (HCC) 02/04/2020   Right leg weakness 09/06/2018   HTN (hypertension) 09/06/2018   HLD (hyperlipidemia) 09/06/2018   Stroke (HCC) 09/06/2018   Acute respiratory failure with hypoxia (HCC) 06/09/2017   Acute respiratory distress    DNR (do not resuscitate) discussion 08/23/2016   Palliative care by specialist 08/23/2016   Dyspnea 08/23/2016   COPD (chronic obstructive pulmonary disease) (HCC) 08/18/2016   PCP:  Ailene Ravel, MD Pharmacy:   [phone] - [place], [place] - [address] [place] [address] Phone: [phone] Fax: [phone]  [id number] [place], [place] - [address] [place] [address] Phone: [phone] Fax: [phone]     Social Determinants of Health (SDOH) Interventions    Readmission Risk Interventions No flowsheet data found.

## 2020-02-05 NOTE — Plan of Care (Signed)
Nutrition Education Note  Patient requested diet education regarding a Heart Healthy diet.   Lipid Panel     Component Value Date/Time   CHOL 130 09/06/2018 0529   TRIG 146 09/06/2018 0529   HDL 33 (L) 09/06/2018 0529   CHOLHDL 3.9 09/06/2018 0529   VLDL 29 09/06/2018 0529   LDLCALC 68 09/06/2018 0529    RD provided "Heart Healthy Nutrition Therapy" handout from the Academy of Nutrition and Dietetics. Reviewed patient's dietary recall. Provided examples on ways to decrease sodium and fat intake in diet. Discouraged intake of processed foods and use of salt shaker. Encouraged fresh fruits and vegetables as well as whole grain sources of carbohydrates to maximize fiber intake. Teach back method used.  Expect good compliance.  Body mass index is 31.32 kg/m. Pt meets criteria for obesity class I based on current BMI.  Current diet order is heart healthy, patient is consuming approximately 100% of meals at this time. Labs and medications reviewed. No further nutrition interventions warranted at this time. RD contact information provided. If additional nutrition issues arise, please re-consult RD.  Felix Pacini, MS, RD, LDN Pager number available on Amion

## 2020-02-05 NOTE — Progress Notes (Signed)
PHARMACIST - PHYSICIAN COMMUNICATION DR:   [place] CONCERNING: Antibiotic IV to Oral Route Change Policy  RECOMMENDATION: This patient is receiving Azithromycin by the intravenous route.  Based on criteria approved by the Pharmacy and Therapeutics Committee, the antibiotic(s) is/are being converted to the equivalent oral dose form(s).   DESCRIPTION: These criteria include:  Patient being treated for a respiratory tract infection, urinary tract infection, cellulitis or clostridium difficile associated diarrhea if on metronidazole  The patient is not neutropenic and does not exhibit a GI malabsorption state  The patient is eating (either orally or via tube) and/or has been taking other orally administered medications for a least 24 hours  The patient is improving clinically and has a Tmax < 100.5  If you have questions about this conversion, please contact the Pharmacy Department   Albina Billet, PharmD, BCPS Clinical Pharmacist 02/05/2020 9:39 AM

## 2020-02-06 MED ORDER — SALINE SPRAY 0.65 % NA SOLN
1.0000 | NASAL | Status: DC | PRN
  Filled 2020-02-06: qty 44

## 2020-02-06 NOTE — Care Management Important Message (Signed)
Important Message  Patient Details  Name: Audrey Sexton MRN: [id number] Date of Birth: 1951/07/21   Medicare Important Message Given:  Yes     [name] A Javin Nong 02/06/2020, 11:25 AM

## 2020-02-06 NOTE — Progress Notes (Signed)
PROGRESS NOTE    Audrey Sexton  HBZ:[id number] DOB: 1951-03-06 DOA: 02/04/2020 PCP: Ailene Ravel, MD   Chief Complaint  Patient presents with  . Shortness of Breath    Brief Narrative:  69 year old female with history of asthma, COPD (not on home O2), hypertension, history of CVA presented with acute onset of increasing shortness of breath with nonproductive cough and wheezing for past few days associated with chest discomfort worsened with cough and deep inspiration. In the ED she was in A. fib with heart rate of 113, tachypneic at 25, O2 sat of 90% on 8 L and requiring BiPAP.  Blood pressure was stable.  COVID-19 PCR was negative.  Chest x-ray with COPD findings.  EKG showed A. fib with RVR.  Patient given, duo nebs, IV magnesium and placed on BiPAP.  Assessment & Plan:   Principal problem  acute respiratory failure with hypoxemia (HCC) Secondary to COPD with acute exacerbation.  Still wheezing with slow improvement (feels about 70% from her baseline).  Maintaining sats on 3 L. Continue IV Solu-Medrol with plan to transition to oral prednisone tomorrow, scheduled duo nebs and antitussives.  Continue empiric IV Rocephin, should complete 3 days of azithromycin today.     Active problems  Sinus tachycardia No signs of A. fib.  Cardizem dose increased with better response.  Hypokalemia/hypomagnesemia Replenished   History of stroke Continue aspirin and statin.   Acute pulmonary edema Improved with a dose of IV Lasix and home dose oral Lasix resumed.  2D echo with grade 1 diastolic dysfunction and normal EF.  Now remains euvolemic.    Essential hypertension Continue lisinopril and HCTZ.   DVT prophylaxis: Subcu Lovenox Code Status: DNR Family Communication: Daughter updated on the phone Disposition:  Status is: Inpatient  Remains inpatient appropriate because:Hemodynamically unstable and Inpatient level of care appropriate due to severity of illness.  Home  possibly tomorrow if respiratory symptoms continues to improve.   Dispo: The patient is from: Home              Anticipated d/c is to: Home              Anticipated d/c date is: 1 day              Patient currently is not medically stable to d/c.       Consultants:   None  Procedures: 2D echo   Antimicrobials: IV Rocephin and Zithromax   Subjective: Sats maintained on 3 L.  Heart rate increases to low 110s on minimal exertion but better from yesterday.  Complains of stuffy nose.  Reports that her breathing is about 70% from her baseline at home.  Objective: Vitals:   02/05/20 2057 02/05/20 2342 02/06/20 0303 02/06/20 0754  BP:  (!) 158/71 127/70 (!) 150/72  Pulse:  97 96 94  Resp:  20 18 19   Temp:  98.5 F (36.9 C) (!) 97.4 F (36.3 C) 97.8 F (36.6 C)  TempSrc:  Oral Oral Axillary  SpO2: 95% 98% 95% 93%  Weight:      Height:        Intake/Output Summary (Last 24 hours) at 02/06/2020 1031 Last data filed at 02/06/2020 1019 Gross per 24 hour  Intake 1249.78 ml  Output 2050 ml  Net -800.22 ml   Filed Weights   02/04/20 0150  Weight: 90.7 kg   Physical exam Elderly female not in distress HEENT: Moist mucosa, supple neck Chest: Diffuse scattered wheezing CVs: S1-S2 regular GI: Soft,  nondistended, nontender Musculoskeletal: Warm, no edema     Data Reviewed: I have personally reviewed following labs and imaging studies  CBC: Recent Labs  Lab 02/04/20 0153 02/04/20 0525  WBC 9.7 8.9  NEUTROABS 4.7  --   HGB 11.1* 11.1*  HCT 34.6* 34.9*  MCV 92.5 94.6  PLT 264 233    Basic Metabolic Panel: Recent Labs  Lab 02/04/20 0153 02/04/20 0525 02/05/20 0521  NA 145 143 136  K 3.7 3.4* 4.5  CL 106 108 99  CO2 31 25 26   GLUCOSE 127* 236* 183*  BUN 16 16 25*  CREATININE 1.08* 1.06* 1.14*  CALCIUM 9.3 9.3 9.5    GFR: Estimated Creatinine Clearance: 53.1 mL/min (A) (by C-G formula based on SCr of 1.14 mg/dL (H)).  Liver Function Tests: No  results for input(s): AST, ALT, ALKPHOS, BILITOT, PROT, ALBUMIN in the last 168 hours.  CBG: No results for input(s): GLUCAP in the last 168 hours.   Recent Results (from the past 240 hour(s))  Respiratory Panel by RT PCR (Flu A&B, Covid) - Nasopharyngeal Swab     Status: None   Collection Time: 02/04/20  1:58 AM   Specimen: Nasopharyngeal Swab  Result Value Ref Range Status   SARS Coronavirus 2 by RT PCR NEGATIVE NEGATIVE Final    Comment: (NOTE) SARS-CoV-2 target nucleic acids are NOT DETECTED. The SARS-CoV-2 RNA is generally detectable in upper respiratoy specimens during the acute phase of infection. The lowest concentration of SARS-CoV-2 viral copies this assay can detect is 131 copies/mL. A negative result does not preclude SARS-Cov-2 infection and should not be used as the sole basis for treatment or other patient management decisions. A negative result may occur with  improper specimen collection/handling, submission of specimen other than nasopharyngeal swab, presence of viral mutation(s) within the areas targeted by this assay, and inadequate number of viral copies (<131 copies/mL). A negative result must be combined with clinical observations, patient history, and epidemiological information. The expected result is Negative. Fact Sheet for Patients:  04/05/20 Fact Sheet for Healthcare Providers:  https://www.moore.com/ This test is not yet ap proved or cleared by the https://www.young.biz/ FDA and  has been authorized for detection and/or diagnosis of SARS-CoV-2 by FDA under an Emergency Use Authorization (EUA). This EUA will remain  in effect (meaning this test can be used) for the duration of the COVID-19 declaration under Section 564(b)(1) of the Act, 21 U.S.C. section 360bbb-3(b)(1), unless the authorization is terminated or revoked sooner.    Influenza A by PCR NEGATIVE NEGATIVE Final   Influenza B by PCR NEGATIVE  NEGATIVE Final    Comment: (NOTE) The Xpert Xpress SARS-CoV-2/FLU/RSV assay is intended as an aid in  the diagnosis of influenza from Nasopharyngeal swab specimens and  should not be used as a sole basis for treatment. Nasal washings and  aspirates are unacceptable for Xpert Xpress SARS-CoV-2/FLU/RSV  testing. Fact Sheet for Patients: [place] Fact Sheet for Healthcare Providers: https://www.moore.com/ This test is not yet approved or cleared by the https://www.young.biz/ FDA and  has been authorized for detection and/or diagnosis of SARS-CoV-2 by  FDA under an Emergency Use Authorization (EUA). This EUA will remain  in effect (meaning this test can be used) for the duration of the  Covid-19 declaration under Section 564(b)(1) of the Act, 21  U.S.C. section 360bbb-3(b)(1), unless the authorization is  terminated or revoked. Performed at [facility], [address], [place], [place]  Radiology Studies: ECHOCARDIOGRAM COMPLETE  Result Date: 02/04/2020    ECHOCARDIOGRAM REPORT   Patient Name:   Audrey Sexton Date of Exam: 02/04/2020 Medical Rec #:  [id number]       Height:       67.0 in Accession #:    [id number]      Weight:       200.0 lb Date of Birth:  April 23, 1951      BSA:          2.022 m Patient Age:    19 years        BP:           147/74 mmHg Patient Gender: F               HR:           112 bpm. Exam Location:  [facility] Procedure: 2D Echo, Color Doppler, Cardiac Doppler and Intracardiac            Opacification Agent Indications:     I50.31 CHF-Acute Diastolic; T70.01 Atrial Fibrillation  History:         Patient has prior history of Echocardiogram examinations.                  Stroke and COPD; Risk Factors:Hypertension and Dyslipidemia.  Sonographer:     Charmayne Sheer RDCS (AE) Referring Phys:  Lerry Liner [facility] Diagnosing Phys: Kate Sable MD  Sonographer Comments: Suboptimal parasternal window.  IMPRESSIONS  1. Left ventricular ejection fraction, by estimation, is 65 to 70%. The left ventricle has normal function. The left ventricle has no regional wall motion abnormalities. Left ventricular diastolic parameters are consistent with Grade I diastolic dysfunction (impaired relaxation).  2. Right ventricular systolic function is normal. The right ventricular size is normal.  3. The mitral valve is normal in structure. No evidence of mitral valve regurgitation. No evidence of mitral stenosis.  4. The aortic valve was not well visualized. Aortic valve regurgitation is not visualized. No aortic stenosis is present.  5. Pulmonic valve regurgitation not assessed.  6. The inferior vena cava is normal in size with greater than 50% respiratory variability, suggesting right atrial pressure of 3 mmHg. FINDINGS  Left Ventricle: Left ventricular ejection fraction, by estimation, is 65 to 70%. The left ventricle has normal function. The left ventricle has no regional wall motion abnormalities. Definity contrast agent was given IV to delineate the left ventricular  endocardial borders. The left ventricular internal cavity size was normal in size. There is no left ventricular hypertrophy. Left ventricular diastolic parameters are consistent with Grade I diastolic dysfunction (impaired relaxation). Right Ventricle: The right ventricular size is normal. No increase in right ventricular wall thickness. Right ventricular systolic function is normal. Left Atrium: Left atrial size was normal in size. Right Atrium: Right atrial size was normal in size. Pericardium: There is no evidence of pericardial effusion. Mitral Valve: The mitral valve is normal in structure. Normal mobility of the mitral valve leaflets. No evidence of mitral valve regurgitation. No evidence of mitral valve stenosis. MV peak gradient, 5.1 mmHg. The mean mitral valve gradient is 3.0 mmHg. Tricuspid Valve: The tricuspid valve is not well visualized. Tricuspid  valve regurgitation is not demonstrated. No evidence of tricuspid stenosis. Aortic Valve: The aortic valve was not well visualized. Aortic valve regurgitation is not visualized. No aortic stenosis is present. Aortic valve mean gradient measures 5.0 mmHg. Aortic valve peak gradient measures 10.4 mmHg. Aortic valve area, by VTI measures 2.88 cm. Pulmonic  Valve: The pulmonic valve was not well visualized. Pulmonic valve regurgitation not assessed. No evidence of pulmonic stenosis. Aorta: The aortic root is normal in size and structure. Venous: The inferior vena cava is normal in size with greater than 50% respiratory variability, suggesting right atrial pressure of 3 mmHg. IAS/Shunts: No atrial level shunt detected by color flow Doppler.  LEFT VENTRICLE PLAX 2D LVOT diam:     2.20 cm     Diastology LV SV:         63          LV e' lateral:   5.22 cm/s LV SV Index:   31          LV E/e' lateral: 11.0 LVOT Area:     3.80 cm    LV e' medial:    8.38 cm/s                            LV E/e' medial:  6.8  LV Volumes (MOD) LV vol d, MOD A4C: 78.9 ml LV vol s, MOD A4C: 26.9 ml LV SV MOD A4C:     78.9 ml RIGHT VENTRICLE RV Basal diam:  2.86 cm LEFT ATRIUM           Index      RIGHT ATRIUM          Index LA Vol (A4C): 15.2 ml 7.52 ml/m RA Area:     6.57 cm                                  RA Volume:   10.20 ml 5.04 ml/m  AORTIC VALVE AV Area (Vmax):    2.38 cm AV Area (Vmean):   2.32 cm AV Area (VTI):     2.88 cm AV Vmax:           161.00 cm/s AV Vmean:          102.000 cm/s AV VTI:            0.218 m AV Peak Grad:      10.4 mmHg AV Mean Grad:      5.0 mmHg LVOT Vmax:         101.00 cm/s LVOT Vmean:        62.200 cm/s LVOT VTI:          0.165 m LVOT/AV VTI ratio: 0.76  AORTA Ao Root diam: 2.60 cm MITRAL VALVE MV Area (PHT): 6.71 cm    SHUNTS MV Peak grad:  5.1 mmHg    Systemic VTI:  0.16 m MV Mean grad:  3.0 mmHg    Systemic Diam: 2.20 cm MV Vmax:       1.13 m/s MV Vmean:      81.2 cm/s MV Decel Time: 113 msec MV E  velocity: 57.20 cm/s MV A velocity: 85.40 cm/s MV E/A ratio:  0.67 Debbe OdeaBrian Agbor-Etang MD Electronically signed by Debbe OdeaBrian Agbor-Etang MD Signature Date/Time: 02/04/2020/4:16:35 PM    Final         Scheduled Meds: . vitamin C  1,000 mg Oral Daily  . aspirin EC  81 mg Oral Daily  . atorvastatin  40 mg Oral q1800  . azithromycin  500 mg Oral Daily  . cholecalciferol  1,000 Units Oral Daily  . diltiazem  300 mg Oral QHS  . enoxaparin (LOVENOX) injection  40 mg Subcutaneous Q24H  . furosemide  40 mg Oral Daily  . guaiFENesin  600 mg Oral BID  . lisinopril  20 mg Oral Daily   And  . hydrochlorothiazide  12.5 mg Oral Daily  . ipratropium-albuterol  3 mL Nebulization QID  . methylPREDNISolone (SOLU-MEDROL) injection  60 mg Intravenous Q8H  . multivitamin with minerals   Oral Daily  . oxybutynin  15 mg Oral Daily  . potassium chloride SA  40 mEq Oral Daily  . umeclidinium bromide  1 puff Inhalation Daily  . venlafaxine XR  150 mg Oral QHS  . vitamin B-12  1,000 mcg Oral Daily   Continuous Infusions: . sodium chloride Stopped (02/05/20 2300)  . cefTRIAXone (ROCEPHIN)  IV 1 g (02/06/20 0515)     LOS: 2 days    Time spent: 25 minutes    Chesney Suares, MD [facility]   To contact the attending provider between 7A-7P or the covering provider during after hours 7P-7A, please log into the web site [email or web address] and access using universal [place] password for that web site. If you do not have the password, please call the hospital operator.  02/06/2020, 10:31 AM

## 2020-02-06 NOTE — Progress Notes (Signed)
   02/06/20 0955  Clinical Encounter Type  Visited With Patient  Visit Type Initial  Referral From Nurse  Consult/Referral To Chaplain  Visited Pt while rounding unit. Pt was open to talk to Ch. Pt was having trouble breathing. Pt was straight to the point, she said, I just need you to pray for me. I prayed for Pt and will follow-up.

## 2020-02-07 LAB — BASIC METABOLIC PANEL
Anion gap: 13 (ref 5–15)
BUN: 42 mg/dL — ABNORMAL HIGH (ref 8–23)
CO2: 30 mmol/L (ref 22–32)
Calcium: 9.4 mg/dL (ref 8.9–10.3)
Chloride: 92 mmol/L — ABNORMAL LOW (ref 98–111)
Creatinine, Ser: 1.26 mg/dL — ABNORMAL HIGH (ref 0.44–1.00)
GFR calc Af Amer: 50 mL/min — ABNORMAL LOW (ref >60)
GFR calc non Af Amer: 43 mL/min — ABNORMAL LOW (ref >60)
Glucose, Bld: 254 mg/dL — ABNORMAL HIGH (ref 70–99)
Potassium: 4.6 mmol/L (ref 3.5–5.1)
Sodium: 135 mmol/L (ref 135–145)

## 2020-02-07 NOTE — Progress Notes (Signed)
Occupational Therapy Treatment Patient Details Name: Audrey Sexton MRN: [id number] DOB: June 05, 1951 Today's Date: 02/07/2020    History of present illness Patient is a 69 y.o. Caucasian female with a known history of asthma, COPD, hypertension, CVA and dyslipidemia, who presented to the emergency room with acute onset of worsening dyspnea and dry cough as well as wheezing over the last couple of days with associated chest pain with deep breathing or cough without fever or chills.   OT comments  Patient seen this date for instruction on energy conservation techniques for use in ADL and IADL tasks.  Patient familiar with some of the concepts and already implements some of them at home such as taking rest breaks, modifying tasks. She would likely benefit from a reacher to assist with lower body dressing and to pick up items from the floor or move them closer to her.  She does not typically wear socks and therefore not interested in a sock aid.  Family assists with shopping, cleaning and bringing her mail in and out.  She does demo desaturation with activity, today she is on 3L and with activity her O2 sats  Dropped to 80.  Able to recover with rest and pursed lip breathing.   Bed mobility with modified independent, sit to stand with min guard.   Patient doing well and continues to benefit from skilled OT services to maximize safety and independence in daily tasks.     Follow Up Recommendations  Home health OT;Supervision - Intermittent    Equipment Recommendations  None recommended by OT;Other (comment) (would benefit from reacher)    Recommendations for Other Services      Precautions / Restrictions Precautions Precautions: Fall Precaution Comments: High Fall Restrictions Weight Bearing Restrictions: No       Mobility Bed Mobility Overal bed mobility: Modified Independent             General bed mobility comments: able to get in and out of the bed with modified independence  but SOB with exertion  Transfers Overall transfer level: Needs assistance Equipment used: Rolling walker (2 wheeled) Transfers: Sit to/from Stand Sit to Stand: Min guard              Balance Overall balance assessment: Needs assistance Sitting-balance support: Feet unsupported;Single extremity supported Sitting balance-Leahy Scale: Good     Standing balance support: Bilateral upper extremity supported Standing balance-Leahy Scale: Good                             ADL either performed or assessed with clinical judgement   ADL Overall ADL's : Needs assistance/impaired Eating/Feeding: Modified independent   Grooming: Modified independent               Lower Body Dressing: With adaptive equipment Lower Body Dressing Details (indicate cue type and reason): Patient instructed on energy conservation techniques for LB dressing, issued handout with details regarding ADLs and IADLs, discussed LB dressing with use of AE, patient does not have a reacher but would benefit from one.  With exertion, patient O2 sats dropped to 80 and coached on pursed lip breathing for recovery. Toilet Transfer: Art therapist      Cognition Arousal/Alertness: Awake/alert Behavior During Therapy: WFL for tasks assessed/performed Overall Cognitive Status: Within Functional  Limits for tasks assessed                                          Exercises     Shoulder Instructions       General Comments      Pertinent Vitals/ Pain       Pain Assessment: No/denies pain  Home Living     Available Help at Discharge: Available 24 hours/day                                    Prior Functioning/Environment              Frequency  Min 1X/week        Progress Toward Goals  OT Goals(current goals can now be found in the care plan section)  Progress towards OT goals:  Progressing toward goals  Acute Rehab OT Goals Patient Stated Goal: to go home  OT Goal Formulation: With patient Time For Goal Achievement: 02/18/20 Potential to Achieve Goals: Good  Plan Discharge plan remains appropriate    Co-evaluation                 AM-PAC OT "6 Clicks" Daily Activity     Outcome Measure   Help from another person eating meals?: None Help from another person taking care of personal grooming?: A Little Help from another person toileting, which includes using toliet, bedpan, or urinal?: A Little Help from another person bathing (including washing, rinsing, drying)?: A Little Help from another person to put on and taking off regular upper body clothing?: None Help from another person to put on and taking off regular lower body clothing?: A Little 6 Click Score: 20    End of Session Equipment Utilized During Treatment: Gait belt  OT Visit Diagnosis: Other abnormalities of gait and mobility (R26.89);Muscle weakness (generalized) (M62.81)   Activity Tolerance Patient tolerated treatment well   Patient Left in bed;with call bell/phone within reach;Other (comment)   Nurse Communication          Time: 1610-9604 OT Time Calculation (min): 34 min  Charges: OT Treatments $Self Care/Home Management : 23-37 mins  Faron Whitelock T Arah Aro, OTR/L, CLT   Camille Dragan 02/07/2020, 9:59 AM

## 2020-02-07 NOTE — Progress Notes (Signed)
PROGRESS NOTE    Audrey Sexton  ZDG:[id number] DOB: Jun 06, 1951 DOA: 02/04/2020 PCP: Leonides Sake, MD   Chief Complaint  Patient presents with  . Shortness of Breath    Brief Narrative:  69 year old female with history of asthma, COPD (not on home O2), hypertension, history of CVA presented with acute onset of increasing shortness of breath with nonproductive cough and wheezing for past few days associated with chest discomfort worsened with cough and deep inspiration. In the ED she was in A. fib with heart rate of 113, tachypneic at 25, O2 sat of 90% on 8 L and requiring BiPAP.  Blood pressure was stable.  COVID-19 PCR was negative.  Chest x-ray with COPD findings.  EKG showed A. fib with RVR.  Patient given, duo nebs, IV magnesium and placed on BiPAP.  Assessment & Plan:   Principal problem  acute respiratory failure with hypoxemia (HCC) Secondary to COPD with acute exacerbation.  Reports breathing about 75% of baseline and still having persistent cough and wheezing.  Reportedly gets tachycardic and wheezy after nebulizer treatment.  If symptoms recur I will switch albuterol to Xopenex. I will continue IV Solu-Medrol for today and hopefully transition her to oral prednisone and discharge her home. Empiric IV Rocephin for total 5 days.  Completed 3 days of azithromycin.  Patient will need nebulizer machine and medications upon discharge.   Active problems  Sinus tachycardia A. fib ruled out.  Cardizem dose increased with better response.  Apparently gets tachycardic after nebulizer treatment.  Hypokalemia/hypomagnesemia Replenished   History of stroke Continue aspirin and statin.   Acute pulmonary edema Improved with a dose of IV Lasix and home dose oral Lasix resumed.  2D echo with grade 1 diastolic dysfunction and normal EF.  Now remains euvolemic.    Essential hypertension Continue lisinopril and HCTZ.   DVT prophylaxis: Subcu Lovenox Code Status: DNR Family  Communication: Daughter updated on the phone Disposition:  Status is: Inpatient  Remains inpatient appropriate because:Hemodynamically unstable and Inpatient level of care appropriate due to severity of illness.  Will need another day of IV Solu-Medrol and nebs given minimally improved symptoms compared to yesterday.  Dispo: The patient is from: Home              Anticipated d/c is to: Home              Anticipated d/c date is: 1 day              Patient currently is not medically stable to d/c.       Consultants:   None  Procedures: 2D echo   Antimicrobials: IV Rocephin and Zithromax   Subjective: Reports frequent cough and breathing symptoms unchanged from yesterday.  Reportedly tachycardic and dyspneic after nebulizer treatment.  Objective: Vitals:   02/06/20 2327 02/07/20 0723 02/07/20 0736 02/07/20 1100  BP: (!) 143/68  (!) 149/56   Pulse: 98  86   Resp: 19  20   Temp: 98 F (36.7 C)  98.2 F (36.8 C)   TempSrc:   Oral   SpO2: 97% 93% 92% 93%  Weight:      Height:        Intake/Output Summary (Last 24 hours) at 02/07/2020 1157 Last data filed at 02/07/2020 1014 Gross per 24 hour  Intake 1664.7 ml  Output 650 ml  Net 1014.7 ml   Filed Weights   02/04/20 0150  Weight: 90.7 kg   Physical exam Fatigued HEENT: Moist mucosa, supple  neck Chest: Scattered wheezing bilaterally (unchanged from yesterday) CVs: S1-S2 tachycardic GI: Soft, nondistended, nontender Musculoskeletal: Warm, no edema     Data Reviewed: I have personally reviewed following labs and imaging studies  CBC: Recent Labs  Lab 02/04/20 0153 02/04/20 0525  WBC 9.7 8.9  NEUTROABS 4.7  --   HGB 11.1* 11.1*  HCT 34.6* 34.9*  MCV 92.5 94.6  PLT 264 233    Basic Metabolic Panel: Recent Labs  Lab 02/04/20 0153 02/04/20 0525 02/05/20 0521 02/07/20 0546  NA 145 143 136 135  K 3.7 3.4* 4.5 4.6  CL 106 108 99 92*  CO2 31 25 26 30   GLUCOSE 127* 236* 183* 254*  BUN 16 16 25*  42*  CREATININE 1.08* 1.06* 1.14* 1.26*  CALCIUM 9.3 9.3 9.5 9.4    GFR: Estimated Creatinine Clearance: 48 mL/min (A) (by C-G formula based on SCr of 1.26 mg/dL (H)).  Liver Function Tests: No results for input(s): AST, ALT, ALKPHOS, BILITOT, PROT, ALBUMIN in the last 168 hours.  CBG: No results for input(s): GLUCAP in the last 168 hours.   Recent Results (from the past 240 hour(s))  Respiratory Panel by RT PCR (Flu A&B, Covid) - Nasopharyngeal Swab     Status: None   Collection Time: 02/04/20  1:58 AM   Specimen: Nasopharyngeal Swab  Result Value Ref Range Status   SARS Coronavirus 2 by RT PCR NEGATIVE NEGATIVE Final    Comment: (NOTE) SARS-CoV-2 target nucleic acids are NOT DETECTED. The SARS-CoV-2 RNA is generally detectable in upper respiratoy specimens during the acute phase of infection. The lowest concentration of SARS-CoV-2 viral copies this assay can detect is 131 copies/mL. A negative result does not preclude SARS-Cov-2 infection and should not be used as the sole basis for treatment or other patient management decisions. A negative result may occur with  improper specimen collection/handling, submission of specimen other than nasopharyngeal swab, presence of viral mutation(s) within the areas targeted by this assay, and inadequate number of viral copies (<131 copies/mL). A negative result must be combined with clinical observations, patient history, and epidemiological information. The expected result is Negative. Fact Sheet for Patients:  04/05/20 Fact Sheet for Healthcare Providers:  https://www.moore.com/ This test is not yet ap proved or cleared by the https://www.young.biz/ FDA and  has been authorized for detection and/or diagnosis of SARS-CoV-2 by FDA under an Emergency Use Authorization (EUA). This EUA will remain  in effect (meaning this test can be used) for the duration of the COVID-19 declaration under  Section 564(b)(1) of the Act, 21 U.S.C. section 360bbb-3(b)(1), unless the authorization is terminated or revoked sooner.    Influenza A by PCR NEGATIVE NEGATIVE Final   Influenza B by PCR NEGATIVE NEGATIVE Final    Comment: (NOTE) The Xpert Xpress SARS-CoV-2/FLU/RSV assay is intended as an aid in  the diagnosis of influenza from Nasopharyngeal swab specimens and  should not be used as a sole basis for treatment. Nasal washings and  aspirates are unacceptable for Xpert Xpress SARS-CoV-2/FLU/RSV  testing. Fact Sheet for Patients: [place] Fact Sheet for Healthcare Providers: https://www.moore.com/ This test is not yet approved or cleared by the https://www.young.biz/ FDA and  has been authorized for detection and/or diagnosis of SARS-CoV-2 by  FDA under an Emergency Use Authorization (EUA). This EUA will remain  in effect (meaning this test can be used) for the duration of the  Covid-19 declaration under Section 564(b)(1) of the Act, 21  U.S.C. section 360bbb-3(b)(1), unless the authorization is  terminated or revoked. Performed at [facility], [address] [place], [place] [address]          Radiology Studies: No results found.      Scheduled Meds: . vitamin C  1,000 mg Oral Daily  . aspirin EC  81 mg Oral Daily  . atorvastatin  40 mg Oral q1800  . azithromycin  500 mg Oral Daily  . cholecalciferol  1,000 Units Oral Daily  . diltiazem  300 mg Oral QHS  . enoxaparin (LOVENOX) injection  40 mg Subcutaneous Q24H  . furosemide  40 mg Oral Daily  . guaiFENesin  600 mg Oral BID  . lisinopril  20 mg Oral Daily   And  . hydrochlorothiazide  12.5 mg Oral Daily  . ipratropium-albuterol  3 mL Nebulization QID  . methylPREDNISolone (SOLU-MEDROL) injection  60 mg Intravenous Q8H  . multivitamin with minerals   Oral Daily  . oxybutynin  15 mg Oral Daily  . potassium chloride SA  40 mEq Oral Daily  . umeclidinium  bromide  1 puff Inhalation Daily  . venlafaxine XR  150 mg Oral QHS  . vitamin B-12  1,000 mcg Oral Daily   Continuous Infusions: . sodium chloride Stopped (02/06/20 0515)  . cefTRIAXone (ROCEPHIN)  IV 1 g (02/07/20 0500)     LOS: 3 days    Time spent: 25 minutes    Beatrice Sehgal, MD [facility]   To contact the attending provider between 7A-7P or the covering provider during after hours 7P-7A, please log into the web site [email or web address] and access using universal [place] password for that web site. If you do not have the password, please call the hospital operator.  02/07/2020, 11:57 AM

## 2020-02-08 DIAGNOSIS — J9622 Acute and chronic respiratory failure with hypercapnia: Secondary | ICD-10-CM | POA: Diagnosis present

## 2020-02-08 DIAGNOSIS — I5032 Chronic diastolic (congestive) heart failure: Secondary | ICD-10-CM | POA: Diagnosis present

## 2020-02-08 DIAGNOSIS — I5033 Acute on chronic diastolic (congestive) heart failure: Secondary | ICD-10-CM

## 2020-02-08 DIAGNOSIS — E876 Hypokalemia: Secondary | ICD-10-CM | POA: Diagnosis present

## 2020-02-08 DIAGNOSIS — J441 Chronic obstructive pulmonary disease with (acute) exacerbation: Secondary | ICD-10-CM | POA: Diagnosis present

## 2020-02-08 DIAGNOSIS — R000 Tachycardia, unspecified: Secondary | ICD-10-CM | POA: Diagnosis present

## 2020-02-08 MED ORDER — PREDNISONE 20 MG PO TABS
20.0000 mg | ORAL_TABLET | Freq: Every day | ORAL | 0 refills | Status: DC
Start: 2020-02-08 — End: 2020-11-15

## 2020-02-08 MED ORDER — IPRATROPIUM-ALBUTEROL 0.5-2.5 (3) MG/3ML IN SOLN: 3.0000 mL | Freq: Four times a day (QID) | RESPIRATORY_TRACT | 1 refills | Status: AC | PRN

## 2020-02-08 MED ORDER — DILTIAZEM HCL ER COATED BEADS 300 MG PO CP24: 300.0000 mg | ORAL_CAPSULE | Freq: Every day | ORAL | 1 refills | Status: AC

## 2020-02-08 MED ORDER — GUAIFENESIN ER 600 MG PO TB12: 600.0000 mg | ORAL_TABLET | Freq: Two times a day (BID) | ORAL | 0 refills | Status: DC

## 2020-02-08 NOTE — Discharge Summary (Signed)
Physician Discharge Summary  Audrey Sexton IHK:[id number] DOB: 03-Jan-1951 DOA: 02/04/2020  PCP: Ailene Ravel, MD  Admit date: 02/04/2020 Discharge date: 02/08/2020  Admitted From: Home Disposition: Home  Recommendations for Outpatient Follow-up:  Patient will be discharged on oral prednisone taper over the next 12 days. Follow-up with PCP in 1 week and recommend outpatient referral to pulmonary.  Home Health: PT Equipment/Devices: Oxygen (3 L chronically)  Discharge Condition: Fair CODE STATUS: DNR Diet recommendation: Heart Healthy   Discharge Diagnoses:  Principal Problem:   Acute on chronic respiratory failure with hypoxia and hypercapnia (HCC)   Active Problems: Essential HTN (hypertension)   Hypokalemia   COPD with acute exacerbation (HCC)   Acute on chronic diastolic CHF (congestive heart failure) (HCC)   Hypomagnesemia   Sinus tachycardia  Brief narrative/HPI 69 year old female with history of asthma, COPD (3L O2), hypertension, history of CVA presented with acute onset of increasing shortness of breath with nonproductive cough and wheezing for past few days associated with chest discomfort worsened with cough and deep inspiration. In the ED she was in A. fib with heart rate of 113, tachypneic at 25, O2 sat of 90% on 8 L and requiring BiPAP.  Blood pressure was stable.  COVID-19 PCR was negative.  Chest x-ray with COPD findings.  EKG showed A. fib with RVR.  Patient given, duo nebs, IV magnesium and placed on BiPAP.  Hospital course  Principal problem  Acute respiratory failure with hypoxemia (HCC) Secondary to COPD with acute exacerbation.   Breathing almost close to baseline and maintaining sats on 3 L.Marland Kitchen  Heart rate better controlled. Stable to be discharged.  Will send her home on oral prednisone taper over the next 12 days.  Completed 5 days of antibiotic. Continue home inhalers.  Patient provided with nebulizer machine and DuoNeb for as needed use.  Scribed  antitussives.  Patient should follow-up with her PCP in 1 week and needs outpatient referral to pulmonary.  Active problems  Sinus tachycardia A. fib ruled out.  Cardizem dose increased with better response.    Hypokalemia/hypomagnesemia Replenished   History of stroke Continue aspirin and statin.   Acute pulmonary edema Improved with a dose of IV Lasix and home dose oral Lasix resumed.  2D echo with grade 1 diastolic dysfunction and normal EF.  Now remains euvolemic.   Essential hypertension Continue lisinopril and HCTZ.  Hyperglycemia Secondary to steroid use.  Should improve once on oral prednisone and dose tapered.  Generalized weakness Seen by PT and recommend home health.  Procedure: None Disposition: Home  Family communication: Daughter updated on the phone.   Discharge Instructions  Discharge Instructions    For home use only DME Nebulizer machine   Complete by: As directed    Patient needs a nebulizer to treat with the following condition: COPD (chronic obstructive pulmonary disease) with chronic bronchitis (HCC)   Length of Need: Lifetime     Allergies as of 02/08/2020      Reactions   Percocet [oxycodone-acetaminophen] Nausea And Vomiting   Vicodin [hydrocodone-acetaminophen] Nausea And Vomiting   Penicillins Hives   Has patient had a PCN reaction causing immediate rash, facial/tongue/throat swelling, SOB or lightheadedness with hypotension: no Has patient had a PCN reaction causing severe rash involving mucus membranes or skin necrosis: no Has patient had a PCN reaction that required hospitalization  no Has patient had a PCN reaction occurring within the last 10 years: no If all of the above answers are "NO", then may proceed  with Cephalosporin use.      Medication List    TAKE these medications   albuterol 108 (90 Base) MCG/ACT inhaler Commonly known as: VENTOLIN HFA Inhale 2-4 puffs by mouth every 4 hours as needed for wheezing, cough,  and/or shortness of breath   aspirin 81 MG EC tablet Take 1 tablet (81 mg total) by mouth daily.   atorvastatin 40 MG tablet Commonly known as: LIPITOR Take 40 mg by mouth at bedtime.   diltiazem 300 MG 24 hr capsule Commonly known as: CARDIZEM CD Take 1 capsule (300 mg total) by mouth at bedtime. What changed:   medication strength  how much to take   fluticasone furoate-vilanterol 100-25 MCG/INH Aepb Commonly known as: BREO ELLIPTA Inhale 1 puff into the lungs daily.   furosemide 40 MG tablet Commonly known as: LASIX Take 40 mg by mouth daily.   guaiFENesin 600 MG 12 hr tablet Commonly known as: MUCINEX Take 1 tablet (600 mg total) by mouth 2 (two) times daily.   Incruse Ellipta 62.5 MCG/INH Aepb Generic drug: umeclidinium bromide Inhale 1 puff into the lungs daily.   ipratropium-albuterol 0.5-2.5 (3) MG/3ML Soln Commonly known as: DUONEB Take 3 mLs by nebulization every 6 (six) hours as needed.   lisinopril-hydrochlorothiazide 20-12.5 MG tablet Commonly known as: ZESTORETIC Take 1 tablet by mouth daily.   MULTI-VITAMIN DAILY PO Take 1 tablet by mouth daily.   oxybutynin 15 MG 24 hr tablet Commonly known as: DITROPAN XL Take 15 mg by mouth daily.   potassium chloride SA 20 MEQ tablet Commonly known as: KLOR-CON Take 1 tablet (20 mEq total) by mouth daily.   predniSONE 20 MG tablet Commonly known as: DELTASONE Take 1 tablet (20 mg total) by mouth daily with breakfast.   traMADol 50 MG tablet Commonly known as: Ultram Take 1 tablet (50 mg total) by mouth every 6 (six) hours as needed.   venlafaxine XR 150 MG 24 hr capsule Commonly known as: EFFEXOR-XR Take 150 mg by mouth at bedtime.   vitamin B-12 1000 MCG tablet Commonly known as: CYANOCOBALAMIN Take 1,000 mcg by mouth daily.   vitamin C 1000 MG tablet Take 1,000 mg by mouth daily.   Vitamin D-1000 Max St 25 MCG (1000 UT) tablet Generic drug: Cholecalciferol Take 1,000 Units by mouth  daily.            Durable Medical Equipment  (From admission, onward)         Start     Ordered   02/08/20 0000  For home use only DME Nebulizer machine       Question Answer Comment  Patient needs a nebulizer to treat with the following condition COPD (chronic obstructive pulmonary disease) with chronic bronchitis (HCC)   Length of Need Lifetime      02/08/20 0948   02/05/20 1534  For home use only DME Nebulizer machine  Once       Question Answer Comment  Patient needs a nebulizer to treat with the following condition COPD (chronic obstructive pulmonary disease) with chronic bronchitis (HCC)   Length of Need 12 Months      02/05/20 1535          Follow-up Information    Hamrick, Maura L, MD. Schedule an appointment as soon as possible for a visit in 1 week(s).   Specialty: Family Medicine Contact information: [address] [place] [address] [phone]              Allergies  Allergen Reactions  . Percocet [Oxycodone-Acetaminophen] Nausea And Vomiting  . Vicodin [Hydrocodone-Acetaminophen] Nausea And Vomiting  . Penicillins Hives    Has patient had a PCN reaction causing immediate rash, facial/tongue/throat swelling, SOB or lightheadedness with hypotension: no Has patient had a PCN reaction causing severe rash involving mucus membranes or skin necrosis: no Has patient had a PCN reaction that required hospitalization  no Has patient had a PCN reaction occurring within the last 10 years: no If all of the above answers are "NO", then may proceed with Cephalosporin use.      Procedures/Studies: DG Chest Portable 1 View  Result Date: 02/04/2020 CLINICAL DATA:  Shortness of breath EXAM: PORTABLE CHEST 1 VIEW COMPARISON:  September 05, 2018 FINDINGS: Coarse lung markings are again noted at the lung bases. There are moderate to severe emphysematous changes. There is no evidence for pneumothorax. There is no significant pleural effusion. The heart size is  stable from prior study. Aortic calcifications are noted. There is no acute osseous abnormality. IMPRESSION: 1. No acute cardiopulmonary process. 2. COPD with bibasilar scarring and/or atelectasis. Electronically Signed   By: Katherine Mantlehristopher  Green M.D.   On: 02/04/2020 02:26   ECHOCARDIOGRAM COMPLETE  Result Date: 02/04/2020    ECHOCARDIOGRAM REPORT   Patient Name:   Florence CannerRLENE G Petta Date of Exam: 02/04/2020 Medical Rec #:  [id number]       Height:       67.0 in Accession #:    [id number]      Weight:       200.0 lb Date of Birth:  [birth year]      BSA:          2.022 m Patient Age:    68 years        BP:           147/74 mmHg Patient Gender: F               HR:           112 bpm. Exam Location:  [facility] Procedure: 2D Echo, Color Doppler, Cardiac Doppler and Intracardiac            Opacification Agent Indications:     I50.31 CHF-Acute Diastolic; I48.91 Atrial Fibrillation  History:         Patient has prior history of Echocardiogram examinations.                  Stroke and COPD; Risk Factors:Hypertension and Dyslipidemia.  Sonographer:     Humphrey RollsJoan Heiss RDCS (AE) Referring Phys:  Ranelle [id number] [facility] Marvine Encalade Diagnosing Phys: Debbe OdeaBrian Agbor-Etang MD  Sonographer Comments: Suboptimal parasternal window. IMPRESSIONS  1. Left ventricular ejection fraction, by estimation, is 65 to 70%. The left ventricle has normal function. The left ventricle has no regional wall motion abnormalities. Left ventricular diastolic parameters are consistent with Grade I diastolic dysfunction (impaired relaxation).  2. Right ventricular systolic function is normal. The right ventricular size is normal.  3. The mitral valve is normal in structure. No evidence of mitral valve regurgitation. No evidence of mitral stenosis.  4. The aortic valve was not well visualized. Aortic valve regurgitation is not visualized. No aortic stenosis is present.  5. Pulmonic valve regurgitation not assessed.  6. The inferior vena cava is normal in size with greater than 50%  respiratory variability, suggesting right atrial pressure of 3 mmHg. FINDINGS  Left Ventricle: Left ventricular ejection fraction, by estimation, is 65 to 70%. The left ventricle has normal function. The left ventricle  has no regional wall motion abnormalities. Definity contrast agent was given IV to delineate the left ventricular  endocardial borders. The left ventricular internal cavity size was normal in size. There is no left ventricular hypertrophy. Left ventricular diastolic parameters are consistent with Grade I diastolic dysfunction (impaired relaxation). Right Ventricle: The right ventricular size is normal. No increase in right ventricular wall thickness. Right ventricular systolic function is normal. Left Atrium: Left atrial size was normal in size. Right Atrium: Right atrial size was normal in size. Pericardium: There is no evidence of pericardial effusion. Mitral Valve: The mitral valve is normal in structure. Normal mobility of the mitral valve leaflets. No evidence of mitral valve regurgitation. No evidence of mitral valve stenosis. MV peak gradient, 5.1 mmHg. The mean mitral valve gradient is 3.0 mmHg. Tricuspid Valve: The tricuspid valve is not well visualized. Tricuspid valve regurgitation is not demonstrated. No evidence of tricuspid stenosis. Aortic Valve: The aortic valve was not well visualized. Aortic valve regurgitation is not visualized. No aortic stenosis is present. Aortic valve mean gradient measures 5.0 mmHg. Aortic valve peak gradient measures 10.4 mmHg. Aortic valve area, by VTI measures 2.88 cm. Pulmonic Valve: The pulmonic valve was not well visualized. Pulmonic valve regurgitation not assessed. No evidence of pulmonic stenosis. Aorta: The aortic root is normal in size and structure. Venous: The inferior vena cava is normal in size with greater than 50% respiratory variability, suggesting right atrial pressure of 3 mmHg. IAS/Shunts: No atrial level shunt detected by color flow  Doppler.  LEFT VENTRICLE PLAX 2D LVOT diam:     2.20 cm     Diastology LV SV:         63          LV e' lateral:   5.22 cm/s LV SV Index:   31          LV E/e' lateral: 11.0 LVOT Area:     3.80 cm    LV e' medial:    8.38 cm/s                            LV E/e' medial:  6.8  LV Volumes (MOD) LV vol d, MOD A4C: 78.9 ml LV vol s, MOD A4C: 26.9 ml LV SV MOD A4C:     78.9 ml RIGHT VENTRICLE RV Basal diam:  2.86 cm LEFT ATRIUM           Index      RIGHT ATRIUM          Index LA Vol (A4C): 15.2 ml 7.52 ml/m RA Area:     6.57 cm                                  RA Volume:   10.20 ml 5.04 ml/m  AORTIC VALVE AV Area (Vmax):    2.38 cm AV Area (Vmean):   2.32 cm AV Area (VTI):     2.88 cm AV Vmax:           161.00 cm/s AV Vmean:          102.000 cm/s AV VTI:            0.218 m AV Peak Grad:      10.4 mmHg AV Mean Grad:      5.0 mmHg LVOT Vmax:         101.00 cm/s LVOT  Vmean:        62.200 cm/s LVOT VTI:          0.165 m LVOT/AV VTI ratio: 0.76  AORTA Ao Root diam: 2.60 cm MITRAL VALVE MV Area (PHT): 6.71 cm    SHUNTS MV Peak grad:  5.1 mmHg    Systemic VTI:  0.16 m MV Mean grad:  3.0 mmHg    Systemic Diam: 2.20 cm MV Vmax:       1.13 m/s MV Vmean:      81.2 cm/s MV Decel Time: 113 msec MV E velocity: 57.20 cm/s MV A velocity: 85.40 cm/s MV E/A ratio:  0.67 Debbe Odea MD Electronically signed by Debbe Odea MD Signature Date/Time: 02/04/2020/4:16:35 PM    Final        Subjective: Seen and examined.  Complains of some cough but breathing more than 80% of her baseline.  No overnight events.  Discharge Exam: Vitals:   02/08/20 0731 02/08/20 0757  BP: (!) 151/72   Pulse: 89   Resp: 20   Temp: 97.9 F (36.6 C)   SpO2: 92% 94%   Vitals:   02/08/20 0016 02/08/20 0436 02/08/20 0731 02/08/20 0757  BP: 140/69 131/75 (!) 151/72   Pulse: 86 90 89   Resp: Temp: 97.7 F (36.5 C) 97.8 F (36.6 C) 97.9 F (36.6 C)   TempSrc:      SpO2: 94% 91% 92% 94%  Weight:      Height:         General: Elderly female not in distress HEENT: Moist mucosa, supple neck Chest: Few scattered rhonchi CVs: Normal S1-S2, no murmurs GI: Soft, nondistended, nontender Musculoskeletal: Warm, no edema   The results of significant diagnostics from this hospitalization (including imaging, microbiology, ancillary and laboratory) are listed below for reference.     Microbiology: Recent Results (from the past 240 hour(s))  Respiratory Panel by RT PCR (Flu A&B, Covid) - Nasopharyngeal Swab     Status: None   Collection Time: 02/04/20  1:58 AM   Specimen: Nasopharyngeal Swab  Result Value Ref Range Status   SARS Coronavirus 2 by RT PCR NEGATIVE NEGATIVE Final    Comment: (NOTE) SARS-CoV-2 target nucleic acids are NOT DETECTED. The SARS-CoV-2 RNA is generally detectable in upper respiratoy specimens during the acute phase of infection. The lowest concentration of SARS-CoV-2 viral copies this assay can detect is 131 copies/mL. A negative result does not preclude SARS-Cov-2 infection and should not be used as the sole basis for treatment or other patient management decisions. A negative result may occur with  improper specimen collection/handling, submission of specimen other than nasopharyngeal swab, presence of viral mutation(s) within the areas targeted by this assay, and inadequate number of viral copies (<131 copies/mL). A negative result must be combined with clinical observations, patient history, and epidemiological information. The expected result is Negative. Fact Sheet for Patients:  https://www.moore.com/ Fact Sheet for Healthcare Providers:  https://www.young.biz/ This test is not yet ap proved or cleared by the [place] FDA and  has been authorized for detection and/or diagnosis of SARS-CoV-2 by FDA under an Emergency Use Authorization (EUA). This EUA will remain  in effect (meaning this test can be used) for the duration of  the COVID-19 declaration under Section 564(b)(1) of the Act, 21 U.S.C. section 360bbb-3(b)(1), unless the authorization is terminated or revoked sooner.    Influenza A by PCR NEGATIVE NEGATIVE Final   Influenza B by PCR NEGATIVE NEGATIVE Final  Comment: (NOTE) The Xpert Xpress SARS-CoV-2/FLU/RSV assay is intended as an aid in  the diagnosis of influenza from Nasopharyngeal swab specimens and  should not be used as a sole basis for treatment. Nasal washings and  aspirates are unacceptable for Xpert Xpress SARS-CoV-2/FLU/RSV  testing. Fact Sheet for Patients: https://www.moore.com/ Fact Sheet for Healthcare Providers: https://www.young.biz/ This test is not yet approved or cleared by the [place] FDA and  has been authorized for detection and/or diagnosis of SARS-CoV-2 by  FDA under an Emergency Use Authorization (EUA). This EUA will remain  in effect (meaning this test can be used) for the duration of the  Covid-19 declaration under Section 564(b)(1) of the Act, 21  U.S.C. section 360bbb-3(b)(1), unless the authorization is  terminated or revoked. Performed at [facility], [address] [place], [place] [address]      Labs: BNP (last 3 results) Recent Labs    02/04/20 0153  BNP 92.2   Basic Metabolic Panel: Recent Labs  Lab 02/04/20 0153 02/04/20 0525 02/05/20 0521 02/07/20 0546  NA 145 143 136 135  K 3.7 3.4* 4.5 4.6  CL 106 108 99 92*  CO2 31 25 26 30   GLUCOSE 127* 236* 183* 254*  BUN 16 16 25* 42*  CREATININE 1.08* 1.06* 1.14* 1.26*  CALCIUM 9.3 9.3 9.5 9.4   Liver Function Tests: No results for input(s): AST, ALT, ALKPHOS, BILITOT, PROT, ALBUMIN in the last 168 hours. No results for input(s): LIPASE, AMYLASE in the last 168 hours. No results for input(s): AMMONIA in the last 168 hours. CBC: Recent Labs  Lab 02/04/20 0153 02/04/20 0525  WBC 9.7 8.9  NEUTROABS 4.7  --   HGB 11.1* 11.1*  HCT  34.6* 34.9*  MCV 92.5 94.6  PLT 264 233   Cardiac Enzymes: No results for input(s): CKTOTAL, CKMB, CKMBINDEX, TROPONINI in the last 168 hours. BNP: Invalid input(s): POCBNP CBG: No results for input(s): GLUCAP in the last 168 hours. D-Dimer No results for input(s): DDIMER in the last 72 hours. Hgb A1c No results for input(s): HGBA1C in the last 72 hours. Lipid Profile No results for input(s): CHOL, HDL, LDLCALC, TRIG, CHOLHDL, LDLDIRECT in the last 72 hours. Thyroid function studies No results for input(s): TSH, T4TOTAL, T3FREE, THYROIDAB in the last 72 hours.  Invalid input(s): FREET3 Anemia work up No results for input(s): VITAMINB12, FOLATE, FERRITIN, TIBC, IRON, RETICCTPCT in the last 72 hours. Urinalysis    Component Value Date/Time   COLORURINE YELLOW (A) 09/05/2018 2107   APPEARANCEUR CLEAR (A) 09/05/2018 2107   APPEARANCEUR Clear 06/16/2014 2108   LABSPEC 1.023 09/05/2018 2107   LABSPEC 1.003 06/16/2014 2108   PHURINE 5.0 09/05/2018 2107   GLUCOSEU NEGATIVE 09/05/2018 2107   GLUCOSEU Negative 06/16/2014 2108   HGBUR NEGATIVE 09/05/2018 2107   BILIRUBINUR NEGATIVE 09/05/2018 2107   BILIRUBINUR Negative 06/16/2014 2108   KETONESUR NEGATIVE 09/05/2018 2107   PROTEINUR NEGATIVE 09/05/2018 2107   NITRITE NEGATIVE 09/05/2018 2107   LEUKOCYTESUR NEGATIVE 09/05/2018 2107   LEUKOCYTESUR Negative 06/16/2014 2108   Sepsis Labs Invalid input(s): PROCALCITONIN,  WBC,  LACTICIDVEN Microbiology Recent Results (from the past 240 hour(s))  Respiratory Panel by RT PCR (Flu A&B, Covid) - Nasopharyngeal Swab     Status: None   Collection Time: 02/04/20  1:58 AM   Specimen: Nasopharyngeal Swab  Result Value Ref Range Status   SARS Coronavirus 2 by RT PCR NEGATIVE NEGATIVE Final    Comment: (NOTE) SARS-CoV-2 target nucleic acids are NOT DETECTED. The  SARS-CoV-2 RNA is generally detectable in upper respiratoy specimens during the acute phase of infection. The  lowest concentration of SARS-CoV-2 viral copies this assay can detect is 131 copies/mL. A negative result does not preclude SARS-Cov-2 infection and should not be used as the sole basis for treatment or other patient management decisions. A negative result may occur with  improper specimen collection/handling, submission of specimen other than nasopharyngeal swab, presence of viral mutation(s) within the areas targeted by this assay, and inadequate number of viral copies (<131 copies/mL). A negative result must be combined with clinical observations, patient history, and epidemiological information. The expected result is Negative. Fact Sheet for Patients:  https://www.moore.com/ Fact Sheet for Healthcare Providers:  https://www.young.biz/ This test is not yet ap proved or cleared by the [place] FDA and  has been authorized for detection and/or diagnosis of SARS-CoV-2 by FDA under an Emergency Use Authorization (EUA). This EUA will remain  in effect (meaning this test can be used) for the duration of the COVID-19 declaration under Section 564(b)(1) of the Act, 21 U.S.C. section 360bbb-3(b)(1), unless the authorization is terminated or revoked sooner.    Influenza A by PCR NEGATIVE NEGATIVE Final   Influenza B by PCR NEGATIVE NEGATIVE Final    Comment: (NOTE) The Xpert Xpress SARS-CoV-2/FLU/RSV assay is intended as an aid in  the diagnosis of influenza from Nasopharyngeal swab specimens and  should not be used as a sole basis for treatment. Nasal washings and  aspirates are unacceptable for Xpert Xpress SARS-CoV-2/FLU/RSV  testing. Fact Sheet for Patients: https://www.moore.com/ Fact Sheet for Healthcare Providers: https://www.young.biz/ This test is not yet approved or cleared by the [place] FDA and  has been authorized for detection and/or diagnosis of SARS-CoV-2 by  FDA under an Emergency  Use Authorization (EUA). This EUA will remain  in effect (meaning this test can be used) for the duration of the  Covid-19 declaration under Section 564(b)(1) of the Act, 21  U.S.C. section 360bbb-3(b)(1), unless the authorization is  terminated or revoked. Performed at [facility], [address] [place], [place] [address]      Time coordinating discharge: 35 minutes  SIGNED:   Eddie North, MD  [facility] 02/08/2020, 9:50 AM Pager   If 7PM-7AM, please contact night-coverage [email or web address] Password [email or web address]

## 2020-02-08 NOTE — Discharge Instructions (Signed)

## 2020-02-08 NOTE — Progress Notes (Signed)
Reviewed AVS with pt. All questions answered. Home neb machine in room with pt.

## 2020-02-08 NOTE — TOC Transition Note (Signed)
Transition of Care [facility]) - CM/SW Discharge Note   Patient Details  Name: Audrey Sexton MRN: [id number] Date of Birth: 15-Jul-1951  [facility] CM/SW Contact:  Maud Deed, LCSW Phone Number: 02/08/2020, 10:00 AM   Clinical Narrative:    Pt is medically stable for discharge per MD. Nebulizer was order on 6/10 and delivered to pt's room. [facility] to follow for [facility]n, PT, and OT. Pt's daughter will be transporting pt home, Daughter aware of discharge.   Final next level of care: Home w Home Health Services Barriers to Discharge: No Barriers Identified   Patient Goals and CMS Choice Patient states their goals for this hospitalization and ongoing recovery are:: wants to get better and go home CMS Medicare.gov Compare Post Acute Care list provided to:: Patient Choice offered to / list presented to : Patient  Discharge Placement                Patient to be transferred to facility by: Daughter Name of family member notified: Latoya Patient and family notified of of transfer: 02/08/20  Discharge Plan and Services   Discharge Planning Services: CM Consult Post Acute Care Choice: Home Health, Durable Medical Equipment          DME Arranged: Nebulizer machine DME Agency: [employer] Date DME Agency Contacted: 02/05/20 Time DME Agency Contacted: [phone] Representative spoke with at DME Agency: Oletha Cruel HH Arranged: RN, PT, OT [facility] Agency: [facility] Date [facility] Agency Contacted: 02/05/20 Time HH Agency Contacted: [phone] Representative spoke with at [facility] Agency: Christy Gentles  Social Determinants of Health (SDOH) Interventions     Readmission Risk Interventions No flowsheet data found.

## 2020-02-09 ENCOUNTER — Other Ambulatory Visit: Payer: Self-pay | Admitting: Family Medicine

## 2020-02-09 DIAGNOSIS — Z1231 Encounter for screening mammogram for malignant neoplasm of breast: Secondary | ICD-10-CM

## 2020-02-10 DIAGNOSIS — J9612 Chronic respiratory failure with hypercapnia: Secondary | ICD-10-CM | POA: Diagnosis not present

## 2020-02-10 DIAGNOSIS — J189 Pneumonia, unspecified organism: Secondary | ICD-10-CM | POA: Diagnosis not present

## 2020-02-10 DIAGNOSIS — J9611 Chronic respiratory failure with hypoxia: Secondary | ICD-10-CM | POA: Diagnosis not present

## 2020-02-10 DIAGNOSIS — I110 Hypertensive heart disease with heart failure: Secondary | ICD-10-CM | POA: Diagnosis not present

## 2020-02-10 DIAGNOSIS — J440 Chronic obstructive pulmonary disease with acute lower respiratory infection: Secondary | ICD-10-CM | POA: Diagnosis not present

## 2020-02-10 DIAGNOSIS — J441 Chronic obstructive pulmonary disease with (acute) exacerbation: Secondary | ICD-10-CM | POA: Diagnosis not present

## 2020-02-10 DIAGNOSIS — M1712 Unilateral primary osteoarthritis, left knee: Secondary | ICD-10-CM | POA: Diagnosis not present

## 2020-02-10 DIAGNOSIS — I480 Paroxysmal atrial fibrillation: Secondary | ICD-10-CM | POA: Diagnosis not present

## 2020-02-10 DIAGNOSIS — I5032 Chronic diastolic (congestive) heart failure: Secondary | ICD-10-CM | POA: Diagnosis not present

## 2020-02-11 DIAGNOSIS — J9612 Chronic respiratory failure with hypercapnia: Secondary | ICD-10-CM | POA: Diagnosis not present

## 2020-02-11 DIAGNOSIS — I5032 Chronic diastolic (congestive) heart failure: Secondary | ICD-10-CM | POA: Diagnosis not present

## 2020-02-11 DIAGNOSIS — I480 Paroxysmal atrial fibrillation: Secondary | ICD-10-CM | POA: Diagnosis not present

## 2020-02-11 DIAGNOSIS — I110 Hypertensive heart disease with heart failure: Secondary | ICD-10-CM | POA: Diagnosis not present

## 2020-02-11 DIAGNOSIS — J440 Chronic obstructive pulmonary disease with acute lower respiratory infection: Secondary | ICD-10-CM | POA: Diagnosis not present

## 2020-02-11 DIAGNOSIS — J189 Pneumonia, unspecified organism: Secondary | ICD-10-CM | POA: Diagnosis not present

## 2020-02-11 DIAGNOSIS — J441 Chronic obstructive pulmonary disease with (acute) exacerbation: Secondary | ICD-10-CM | POA: Diagnosis not present

## 2020-02-11 DIAGNOSIS — J9611 Chronic respiratory failure with hypoxia: Secondary | ICD-10-CM | POA: Diagnosis not present

## 2020-02-11 DIAGNOSIS — M1712 Unilateral primary osteoarthritis, left knee: Secondary | ICD-10-CM | POA: Diagnosis not present

## 2020-02-12 DIAGNOSIS — M1712 Unilateral primary osteoarthritis, left knee: Secondary | ICD-10-CM | POA: Diagnosis not present

## 2020-02-12 DIAGNOSIS — J441 Chronic obstructive pulmonary disease with (acute) exacerbation: Secondary | ICD-10-CM | POA: Diagnosis not present

## 2020-02-12 DIAGNOSIS — J189 Pneumonia, unspecified organism: Secondary | ICD-10-CM | POA: Diagnosis not present

## 2020-02-12 DIAGNOSIS — J9611 Chronic respiratory failure with hypoxia: Secondary | ICD-10-CM | POA: Diagnosis not present

## 2020-02-12 DIAGNOSIS — I480 Paroxysmal atrial fibrillation: Secondary | ICD-10-CM | POA: Diagnosis not present

## 2020-02-12 DIAGNOSIS — J440 Chronic obstructive pulmonary disease with acute lower respiratory infection: Secondary | ICD-10-CM | POA: Diagnosis not present

## 2020-02-12 DIAGNOSIS — I110 Hypertensive heart disease with heart failure: Secondary | ICD-10-CM | POA: Diagnosis not present

## 2020-02-12 DIAGNOSIS — I5032 Chronic diastolic (congestive) heart failure: Secondary | ICD-10-CM | POA: Diagnosis not present

## 2020-02-12 DIAGNOSIS — J9612 Chronic respiratory failure with hypercapnia: Secondary | ICD-10-CM | POA: Diagnosis not present

## 2020-02-16 DIAGNOSIS — I480 Paroxysmal atrial fibrillation: Secondary | ICD-10-CM | POA: Diagnosis not present

## 2020-02-16 DIAGNOSIS — J9612 Chronic respiratory failure with hypercapnia: Secondary | ICD-10-CM | POA: Diagnosis not present

## 2020-02-16 DIAGNOSIS — M1712 Unilateral primary osteoarthritis, left knee: Secondary | ICD-10-CM | POA: Diagnosis not present

## 2020-02-16 DIAGNOSIS — J440 Chronic obstructive pulmonary disease with acute lower respiratory infection: Secondary | ICD-10-CM | POA: Diagnosis not present

## 2020-02-16 DIAGNOSIS — I5032 Chronic diastolic (congestive) heart failure: Secondary | ICD-10-CM | POA: Diagnosis not present

## 2020-02-16 DIAGNOSIS — J9611 Chronic respiratory failure with hypoxia: Secondary | ICD-10-CM | POA: Diagnosis not present

## 2020-02-16 DIAGNOSIS — I110 Hypertensive heart disease with heart failure: Secondary | ICD-10-CM | POA: Diagnosis not present

## 2020-02-16 DIAGNOSIS — J441 Chronic obstructive pulmonary disease with (acute) exacerbation: Secondary | ICD-10-CM | POA: Diagnosis not present

## 2020-02-16 DIAGNOSIS — J189 Pneumonia, unspecified organism: Secondary | ICD-10-CM | POA: Diagnosis not present

## 2020-02-18 DIAGNOSIS — J189 Pneumonia, unspecified organism: Secondary | ICD-10-CM | POA: Diagnosis not present

## 2020-02-18 DIAGNOSIS — J441 Chronic obstructive pulmonary disease with (acute) exacerbation: Secondary | ICD-10-CM | POA: Diagnosis not present

## 2020-02-18 DIAGNOSIS — I5032 Chronic diastolic (congestive) heart failure: Secondary | ICD-10-CM | POA: Diagnosis not present

## 2020-02-18 DIAGNOSIS — I480 Paroxysmal atrial fibrillation: Secondary | ICD-10-CM | POA: Diagnosis not present

## 2020-02-18 DIAGNOSIS — M1712 Unilateral primary osteoarthritis, left knee: Secondary | ICD-10-CM | POA: Diagnosis not present

## 2020-02-18 DIAGNOSIS — I110 Hypertensive heart disease with heart failure: Secondary | ICD-10-CM | POA: Diagnosis not present

## 2020-02-18 DIAGNOSIS — J9612 Chronic respiratory failure with hypercapnia: Secondary | ICD-10-CM | POA: Diagnosis not present

## 2020-02-18 DIAGNOSIS — J440 Chronic obstructive pulmonary disease with acute lower respiratory infection: Secondary | ICD-10-CM | POA: Diagnosis not present

## 2020-02-18 DIAGNOSIS — J9611 Chronic respiratory failure with hypoxia: Secondary | ICD-10-CM | POA: Diagnosis not present

## 2020-02-19 DIAGNOSIS — J441 Chronic obstructive pulmonary disease with (acute) exacerbation: Secondary | ICD-10-CM | POA: Diagnosis not present

## 2020-02-19 DIAGNOSIS — I5032 Chronic diastolic (congestive) heart failure: Secondary | ICD-10-CM | POA: Diagnosis not present

## 2020-02-19 DIAGNOSIS — J9611 Chronic respiratory failure with hypoxia: Secondary | ICD-10-CM | POA: Diagnosis not present

## 2020-02-19 DIAGNOSIS — J189 Pneumonia, unspecified organism: Secondary | ICD-10-CM | POA: Diagnosis not present

## 2020-02-19 DIAGNOSIS — I110 Hypertensive heart disease with heart failure: Secondary | ICD-10-CM | POA: Diagnosis not present

## 2020-02-19 DIAGNOSIS — J440 Chronic obstructive pulmonary disease with acute lower respiratory infection: Secondary | ICD-10-CM | POA: Diagnosis not present

## 2020-02-19 DIAGNOSIS — J9612 Chronic respiratory failure with hypercapnia: Secondary | ICD-10-CM | POA: Diagnosis not present

## 2020-02-19 DIAGNOSIS — I480 Paroxysmal atrial fibrillation: Secondary | ICD-10-CM | POA: Diagnosis not present

## 2020-02-19 DIAGNOSIS — M1712 Unilateral primary osteoarthritis, left knee: Secondary | ICD-10-CM | POA: Diagnosis not present

## 2020-02-20 DIAGNOSIS — J441 Chronic obstructive pulmonary disease with (acute) exacerbation: Secondary | ICD-10-CM | POA: Diagnosis not present

## 2020-02-20 DIAGNOSIS — I480 Paroxysmal atrial fibrillation: Secondary | ICD-10-CM | POA: Diagnosis not present

## 2020-02-20 DIAGNOSIS — I5032 Chronic diastolic (congestive) heart failure: Secondary | ICD-10-CM | POA: Diagnosis not present

## 2020-02-20 DIAGNOSIS — M1712 Unilateral primary osteoarthritis, left knee: Secondary | ICD-10-CM | POA: Diagnosis not present

## 2020-02-20 DIAGNOSIS — I110 Hypertensive heart disease with heart failure: Secondary | ICD-10-CM | POA: Diagnosis not present

## 2020-02-20 DIAGNOSIS — J189 Pneumonia, unspecified organism: Secondary | ICD-10-CM | POA: Diagnosis not present

## 2020-02-20 DIAGNOSIS — J9612 Chronic respiratory failure with hypercapnia: Secondary | ICD-10-CM | POA: Diagnosis not present

## 2020-02-20 DIAGNOSIS — J440 Chronic obstructive pulmonary disease with acute lower respiratory infection: Secondary | ICD-10-CM | POA: Diagnosis not present

## 2020-02-20 DIAGNOSIS — J9611 Chronic respiratory failure with hypoxia: Secondary | ICD-10-CM | POA: Diagnosis not present

## 2020-02-22 DIAGNOSIS — I5032 Chronic diastolic (congestive) heart failure: Secondary | ICD-10-CM | POA: Diagnosis not present

## 2020-02-22 DIAGNOSIS — I480 Paroxysmal atrial fibrillation: Secondary | ICD-10-CM | POA: Diagnosis not present

## 2020-02-22 DIAGNOSIS — J9612 Chronic respiratory failure with hypercapnia: Secondary | ICD-10-CM | POA: Diagnosis not present

## 2020-02-22 DIAGNOSIS — M1712 Unilateral primary osteoarthritis, left knee: Secondary | ICD-10-CM | POA: Diagnosis not present

## 2020-02-22 DIAGNOSIS — J441 Chronic obstructive pulmonary disease with (acute) exacerbation: Secondary | ICD-10-CM | POA: Diagnosis not present

## 2020-02-22 DIAGNOSIS — J9611 Chronic respiratory failure with hypoxia: Secondary | ICD-10-CM | POA: Diagnosis not present

## 2020-02-22 DIAGNOSIS — J440 Chronic obstructive pulmonary disease with acute lower respiratory infection: Secondary | ICD-10-CM | POA: Diagnosis not present

## 2020-02-22 DIAGNOSIS — I110 Hypertensive heart disease with heart failure: Secondary | ICD-10-CM | POA: Diagnosis not present

## 2020-02-22 DIAGNOSIS — J189 Pneumonia, unspecified organism: Secondary | ICD-10-CM | POA: Diagnosis not present

## 2020-02-25 DIAGNOSIS — R049 Hemorrhage from respiratory passages, unspecified: Secondary | ICD-10-CM | POA: Diagnosis not present

## 2020-02-25 DIAGNOSIS — J449 Chronic obstructive pulmonary disease, unspecified: Secondary | ICD-10-CM | POA: Diagnosis not present

## 2020-02-25 DIAGNOSIS — I10 Essential (primary) hypertension: Secondary | ICD-10-CM | POA: Diagnosis not present

## 2020-02-25 DIAGNOSIS — J189 Pneumonia, unspecified organism: Secondary | ICD-10-CM | POA: Diagnosis not present

## 2020-02-27 DIAGNOSIS — I480 Paroxysmal atrial fibrillation: Secondary | ICD-10-CM | POA: Diagnosis not present

## 2020-02-27 DIAGNOSIS — J9611 Chronic respiratory failure with hypoxia: Secondary | ICD-10-CM | POA: Diagnosis not present

## 2020-02-27 DIAGNOSIS — I5032 Chronic diastolic (congestive) heart failure: Secondary | ICD-10-CM | POA: Diagnosis not present

## 2020-02-27 DIAGNOSIS — J9612 Chronic respiratory failure with hypercapnia: Secondary | ICD-10-CM | POA: Diagnosis not present

## 2020-02-27 DIAGNOSIS — J189 Pneumonia, unspecified organism: Secondary | ICD-10-CM | POA: Diagnosis not present

## 2020-02-27 DIAGNOSIS — J440 Chronic obstructive pulmonary disease with acute lower respiratory infection: Secondary | ICD-10-CM | POA: Diagnosis not present

## 2020-02-27 DIAGNOSIS — M1712 Unilateral primary osteoarthritis, left knee: Secondary | ICD-10-CM | POA: Diagnosis not present

## 2020-02-27 DIAGNOSIS — J441 Chronic obstructive pulmonary disease with (acute) exacerbation: Secondary | ICD-10-CM | POA: Diagnosis not present

## 2020-02-27 DIAGNOSIS — I110 Hypertensive heart disease with heart failure: Secondary | ICD-10-CM | POA: Diagnosis not present

## 2020-03-02 DIAGNOSIS — J441 Chronic obstructive pulmonary disease with (acute) exacerbation: Secondary | ICD-10-CM | POA: Diagnosis not present

## 2020-03-02 DIAGNOSIS — J440 Chronic obstructive pulmonary disease with acute lower respiratory infection: Secondary | ICD-10-CM | POA: Diagnosis not present

## 2020-03-02 DIAGNOSIS — J189 Pneumonia, unspecified organism: Secondary | ICD-10-CM | POA: Diagnosis not present

## 2020-03-02 DIAGNOSIS — J9611 Chronic respiratory failure with hypoxia: Secondary | ICD-10-CM | POA: Diagnosis not present

## 2020-03-02 DIAGNOSIS — I5032 Chronic diastolic (congestive) heart failure: Secondary | ICD-10-CM | POA: Diagnosis not present

## 2020-03-02 DIAGNOSIS — I110 Hypertensive heart disease with heart failure: Secondary | ICD-10-CM | POA: Diagnosis not present

## 2020-03-02 DIAGNOSIS — J9612 Chronic respiratory failure with hypercapnia: Secondary | ICD-10-CM | POA: Diagnosis not present

## 2020-03-02 DIAGNOSIS — M1712 Unilateral primary osteoarthritis, left knee: Secondary | ICD-10-CM | POA: Diagnosis not present

## 2020-03-02 DIAGNOSIS — I480 Paroxysmal atrial fibrillation: Secondary | ICD-10-CM | POA: Diagnosis not present

## 2020-03-04 DIAGNOSIS — J440 Chronic obstructive pulmonary disease with acute lower respiratory infection: Secondary | ICD-10-CM | POA: Diagnosis not present

## 2020-03-04 DIAGNOSIS — I5032 Chronic diastolic (congestive) heart failure: Secondary | ICD-10-CM | POA: Diagnosis not present

## 2020-03-04 DIAGNOSIS — I480 Paroxysmal atrial fibrillation: Secondary | ICD-10-CM | POA: Diagnosis not present

## 2020-03-04 DIAGNOSIS — M1712 Unilateral primary osteoarthritis, left knee: Secondary | ICD-10-CM | POA: Diagnosis not present

## 2020-03-04 DIAGNOSIS — J9611 Chronic respiratory failure with hypoxia: Secondary | ICD-10-CM | POA: Diagnosis not present

## 2020-03-04 DIAGNOSIS — J441 Chronic obstructive pulmonary disease with (acute) exacerbation: Secondary | ICD-10-CM | POA: Diagnosis not present

## 2020-03-04 DIAGNOSIS — I110 Hypertensive heart disease with heart failure: Secondary | ICD-10-CM | POA: Diagnosis not present

## 2020-03-04 DIAGNOSIS — J9612 Chronic respiratory failure with hypercapnia: Secondary | ICD-10-CM | POA: Diagnosis not present

## 2020-03-04 DIAGNOSIS — J189 Pneumonia, unspecified organism: Secondary | ICD-10-CM | POA: Diagnosis not present

## 2020-03-06 DIAGNOSIS — J449 Chronic obstructive pulmonary disease, unspecified: Secondary | ICD-10-CM | POA: Diagnosis not present

## 2020-03-06 DIAGNOSIS — J9601 Acute respiratory failure with hypoxia: Secondary | ICD-10-CM | POA: Diagnosis not present

## 2020-03-06 DIAGNOSIS — J9600 Acute respiratory failure, unspecified whether with hypoxia or hypercapnia: Secondary | ICD-10-CM | POA: Diagnosis not present

## 2020-03-11 DIAGNOSIS — I5032 Chronic diastolic (congestive) heart failure: Secondary | ICD-10-CM | POA: Diagnosis not present

## 2020-03-11 DIAGNOSIS — J9612 Chronic respiratory failure with hypercapnia: Secondary | ICD-10-CM | POA: Diagnosis not present

## 2020-03-11 DIAGNOSIS — J9611 Chronic respiratory failure with hypoxia: Secondary | ICD-10-CM | POA: Diagnosis not present

## 2020-03-11 DIAGNOSIS — M1712 Unilateral primary osteoarthritis, left knee: Secondary | ICD-10-CM | POA: Diagnosis not present

## 2020-03-11 DIAGNOSIS — J440 Chronic obstructive pulmonary disease with acute lower respiratory infection: Secondary | ICD-10-CM | POA: Diagnosis not present

## 2020-03-11 DIAGNOSIS — I110 Hypertensive heart disease with heart failure: Secondary | ICD-10-CM | POA: Diagnosis not present

## 2020-03-11 DIAGNOSIS — J189 Pneumonia, unspecified organism: Secondary | ICD-10-CM | POA: Diagnosis not present

## 2020-03-11 DIAGNOSIS — I480 Paroxysmal atrial fibrillation: Secondary | ICD-10-CM | POA: Diagnosis not present

## 2020-03-11 DIAGNOSIS — J441 Chronic obstructive pulmonary disease with (acute) exacerbation: Secondary | ICD-10-CM | POA: Diagnosis not present

## 2020-03-15 DIAGNOSIS — J9612 Chronic respiratory failure with hypercapnia: Secondary | ICD-10-CM | POA: Diagnosis not present

## 2020-03-15 DIAGNOSIS — J9611 Chronic respiratory failure with hypoxia: Secondary | ICD-10-CM | POA: Diagnosis not present

## 2020-03-15 DIAGNOSIS — I480 Paroxysmal atrial fibrillation: Secondary | ICD-10-CM | POA: Diagnosis not present

## 2020-03-15 DIAGNOSIS — J189 Pneumonia, unspecified organism: Secondary | ICD-10-CM | POA: Diagnosis not present

## 2020-03-15 DIAGNOSIS — I5032 Chronic diastolic (congestive) heart failure: Secondary | ICD-10-CM | POA: Diagnosis not present

## 2020-03-15 DIAGNOSIS — M1712 Unilateral primary osteoarthritis, left knee: Secondary | ICD-10-CM | POA: Diagnosis not present

## 2020-03-15 DIAGNOSIS — J440 Chronic obstructive pulmonary disease with acute lower respiratory infection: Secondary | ICD-10-CM | POA: Diagnosis not present

## 2020-03-15 DIAGNOSIS — J441 Chronic obstructive pulmonary disease with (acute) exacerbation: Secondary | ICD-10-CM | POA: Diagnosis not present

## 2020-03-15 DIAGNOSIS — I110 Hypertensive heart disease with heart failure: Secondary | ICD-10-CM | POA: Diagnosis not present

## 2020-03-16 DIAGNOSIS — J9611 Chronic respiratory failure with hypoxia: Secondary | ICD-10-CM | POA: Diagnosis not present

## 2020-03-16 DIAGNOSIS — J440 Chronic obstructive pulmonary disease with acute lower respiratory infection: Secondary | ICD-10-CM | POA: Diagnosis not present

## 2020-03-16 DIAGNOSIS — I5032 Chronic diastolic (congestive) heart failure: Secondary | ICD-10-CM | POA: Diagnosis not present

## 2020-03-16 DIAGNOSIS — J189 Pneumonia, unspecified organism: Secondary | ICD-10-CM | POA: Diagnosis not present

## 2020-03-16 DIAGNOSIS — J441 Chronic obstructive pulmonary disease with (acute) exacerbation: Secondary | ICD-10-CM | POA: Diagnosis not present

## 2020-03-16 DIAGNOSIS — I110 Hypertensive heart disease with heart failure: Secondary | ICD-10-CM | POA: Diagnosis not present

## 2020-03-16 DIAGNOSIS — J9612 Chronic respiratory failure with hypercapnia: Secondary | ICD-10-CM | POA: Diagnosis not present

## 2020-03-16 DIAGNOSIS — M1712 Unilateral primary osteoarthritis, left knee: Secondary | ICD-10-CM | POA: Diagnosis not present

## 2020-03-16 DIAGNOSIS — I480 Paroxysmal atrial fibrillation: Secondary | ICD-10-CM | POA: Diagnosis not present

## 2020-03-24 DIAGNOSIS — M1712 Unilateral primary osteoarthritis, left knee: Secondary | ICD-10-CM | POA: Diagnosis not present

## 2020-03-24 DIAGNOSIS — J9612 Chronic respiratory failure with hypercapnia: Secondary | ICD-10-CM | POA: Diagnosis not present

## 2020-03-24 DIAGNOSIS — J9611 Chronic respiratory failure with hypoxia: Secondary | ICD-10-CM | POA: Diagnosis not present

## 2020-03-24 DIAGNOSIS — J189 Pneumonia, unspecified organism: Secondary | ICD-10-CM | POA: Diagnosis not present

## 2020-03-24 DIAGNOSIS — I110 Hypertensive heart disease with heart failure: Secondary | ICD-10-CM | POA: Diagnosis not present

## 2020-03-24 DIAGNOSIS — I480 Paroxysmal atrial fibrillation: Secondary | ICD-10-CM | POA: Diagnosis not present

## 2020-03-24 DIAGNOSIS — J441 Chronic obstructive pulmonary disease with (acute) exacerbation: Secondary | ICD-10-CM | POA: Diagnosis not present

## 2020-03-24 DIAGNOSIS — I5032 Chronic diastolic (congestive) heart failure: Secondary | ICD-10-CM | POA: Diagnosis not present

## 2020-03-24 DIAGNOSIS — J440 Chronic obstructive pulmonary disease with acute lower respiratory infection: Secondary | ICD-10-CM | POA: Diagnosis not present

## 2020-03-26 DIAGNOSIS — I10 Essential (primary) hypertension: Secondary | ICD-10-CM | POA: Diagnosis not present

## 2020-03-26 DIAGNOSIS — R049 Hemorrhage from respiratory passages, unspecified: Secondary | ICD-10-CM | POA: Diagnosis not present

## 2020-03-26 DIAGNOSIS — J449 Chronic obstructive pulmonary disease, unspecified: Secondary | ICD-10-CM | POA: Diagnosis not present

## 2020-03-26 DIAGNOSIS — J189 Pneumonia, unspecified organism: Secondary | ICD-10-CM | POA: Diagnosis not present

## 2020-03-27 DIAGNOSIS — J441 Chronic obstructive pulmonary disease with (acute) exacerbation: Secondary | ICD-10-CM | POA: Diagnosis not present

## 2020-03-27 DIAGNOSIS — J189 Pneumonia, unspecified organism: Secondary | ICD-10-CM | POA: Diagnosis not present

## 2020-03-27 DIAGNOSIS — J9611 Chronic respiratory failure with hypoxia: Secondary | ICD-10-CM | POA: Diagnosis not present

## 2020-03-27 DIAGNOSIS — I480 Paroxysmal atrial fibrillation: Secondary | ICD-10-CM | POA: Diagnosis not present

## 2020-03-27 DIAGNOSIS — M1712 Unilateral primary osteoarthritis, left knee: Secondary | ICD-10-CM | POA: Diagnosis not present

## 2020-03-27 DIAGNOSIS — J9612 Chronic respiratory failure with hypercapnia: Secondary | ICD-10-CM | POA: Diagnosis not present

## 2020-03-27 DIAGNOSIS — J440 Chronic obstructive pulmonary disease with acute lower respiratory infection: Secondary | ICD-10-CM | POA: Diagnosis not present

## 2020-03-27 DIAGNOSIS — I5032 Chronic diastolic (congestive) heart failure: Secondary | ICD-10-CM | POA: Diagnosis not present

## 2020-03-27 DIAGNOSIS — I110 Hypertensive heart disease with heart failure: Secondary | ICD-10-CM | POA: Diagnosis not present

## 2020-03-30 DIAGNOSIS — J9612 Chronic respiratory failure with hypercapnia: Secondary | ICD-10-CM | POA: Diagnosis not present

## 2020-03-30 DIAGNOSIS — J441 Chronic obstructive pulmonary disease with (acute) exacerbation: Secondary | ICD-10-CM | POA: Diagnosis not present

## 2020-03-30 DIAGNOSIS — I5032 Chronic diastolic (congestive) heart failure: Secondary | ICD-10-CM | POA: Diagnosis not present

## 2020-03-30 DIAGNOSIS — J9611 Chronic respiratory failure with hypoxia: Secondary | ICD-10-CM | POA: Diagnosis not present

## 2020-03-30 DIAGNOSIS — I110 Hypertensive heart disease with heart failure: Secondary | ICD-10-CM | POA: Diagnosis not present

## 2020-03-30 DIAGNOSIS — M1712 Unilateral primary osteoarthritis, left knee: Secondary | ICD-10-CM | POA: Diagnosis not present

## 2020-03-30 DIAGNOSIS — I480 Paroxysmal atrial fibrillation: Secondary | ICD-10-CM | POA: Diagnosis not present

## 2020-03-30 DIAGNOSIS — J440 Chronic obstructive pulmonary disease with acute lower respiratory infection: Secondary | ICD-10-CM | POA: Diagnosis not present

## 2020-03-30 DIAGNOSIS — J189 Pneumonia, unspecified organism: Secondary | ICD-10-CM | POA: Diagnosis not present

## 2020-04-06 DIAGNOSIS — I5032 Chronic diastolic (congestive) heart failure: Secondary | ICD-10-CM | POA: Diagnosis not present

## 2020-04-06 DIAGNOSIS — J189 Pneumonia, unspecified organism: Secondary | ICD-10-CM | POA: Diagnosis not present

## 2020-04-06 DIAGNOSIS — M1712 Unilateral primary osteoarthritis, left knee: Secondary | ICD-10-CM | POA: Diagnosis not present

## 2020-04-06 DIAGNOSIS — J440 Chronic obstructive pulmonary disease with acute lower respiratory infection: Secondary | ICD-10-CM | POA: Diagnosis not present

## 2020-04-06 DIAGNOSIS — J9612 Chronic respiratory failure with hypercapnia: Secondary | ICD-10-CM | POA: Diagnosis not present

## 2020-04-06 DIAGNOSIS — J9601 Acute respiratory failure with hypoxia: Secondary | ICD-10-CM | POA: Diagnosis not present

## 2020-04-06 DIAGNOSIS — J441 Chronic obstructive pulmonary disease with (acute) exacerbation: Secondary | ICD-10-CM | POA: Diagnosis not present

## 2020-04-06 DIAGNOSIS — J9600 Acute respiratory failure, unspecified whether with hypoxia or hypercapnia: Secondary | ICD-10-CM | POA: Diagnosis not present

## 2020-04-06 DIAGNOSIS — J9611 Chronic respiratory failure with hypoxia: Secondary | ICD-10-CM | POA: Diagnosis not present

## 2020-04-06 DIAGNOSIS — J449 Chronic obstructive pulmonary disease, unspecified: Secondary | ICD-10-CM | POA: Diagnosis not present

## 2020-04-06 DIAGNOSIS — I480 Paroxysmal atrial fibrillation: Secondary | ICD-10-CM | POA: Diagnosis not present

## 2020-04-06 DIAGNOSIS — I110 Hypertensive heart disease with heart failure: Secondary | ICD-10-CM | POA: Diagnosis not present

## 2020-04-26 DIAGNOSIS — I10 Essential (primary) hypertension: Secondary | ICD-10-CM | POA: Diagnosis not present

## 2020-04-26 DIAGNOSIS — J189 Pneumonia, unspecified organism: Secondary | ICD-10-CM | POA: Diagnosis not present

## 2020-04-26 DIAGNOSIS — R049 Hemorrhage from respiratory passages, unspecified: Secondary | ICD-10-CM | POA: Diagnosis not present

## 2020-04-26 DIAGNOSIS — J449 Chronic obstructive pulmonary disease, unspecified: Secondary | ICD-10-CM | POA: Diagnosis not present

## 2020-05-07 DIAGNOSIS — J9601 Acute respiratory failure with hypoxia: Secondary | ICD-10-CM | POA: Diagnosis not present

## 2020-05-07 DIAGNOSIS — J449 Chronic obstructive pulmonary disease, unspecified: Secondary | ICD-10-CM | POA: Diagnosis not present

## 2020-05-07 DIAGNOSIS — J9600 Acute respiratory failure, unspecified whether with hypoxia or hypercapnia: Secondary | ICD-10-CM | POA: Diagnosis not present

## 2020-05-11 DIAGNOSIS — J449 Chronic obstructive pulmonary disease, unspecified: Secondary | ICD-10-CM | POA: Diagnosis not present

## 2020-05-11 DIAGNOSIS — E785 Hyperlipidemia, unspecified: Secondary | ICD-10-CM | POA: Diagnosis not present

## 2020-05-11 DIAGNOSIS — Z6836 Body mass index (BMI) 36.0-36.9, adult: Secondary | ICD-10-CM | POA: Diagnosis not present

## 2020-05-11 DIAGNOSIS — I700 Atherosclerosis of aorta: Secondary | ICD-10-CM | POA: Diagnosis not present

## 2020-05-11 DIAGNOSIS — I10 Essential (primary) hypertension: Secondary | ICD-10-CM | POA: Diagnosis not present

## 2020-05-11 DIAGNOSIS — R000 Tachycardia, unspecified: Secondary | ICD-10-CM | POA: Diagnosis not present

## 2020-05-11 DIAGNOSIS — F321 Major depressive disorder, single episode, moderate: Secondary | ICD-10-CM | POA: Diagnosis not present

## 2020-05-27 DIAGNOSIS — I10 Essential (primary) hypertension: Secondary | ICD-10-CM | POA: Diagnosis not present

## 2020-05-27 DIAGNOSIS — R049 Hemorrhage from respiratory passages, unspecified: Secondary | ICD-10-CM | POA: Diagnosis not present

## 2020-05-27 DIAGNOSIS — J449 Chronic obstructive pulmonary disease, unspecified: Secondary | ICD-10-CM | POA: Diagnosis not present

## 2020-05-27 DIAGNOSIS — J189 Pneumonia, unspecified organism: Secondary | ICD-10-CM | POA: Diagnosis not present

## 2020-09-03 ENCOUNTER — Other Ambulatory Visit: Payer: Self-pay

## 2020-09-03 ENCOUNTER — Other Ambulatory Visit
Admission: RE | Admit: 2020-09-03 | Discharge: 2020-09-03 | Disposition: A | Discharge status: Home / Self Care | Payer: Medicare HMO | Source: Ambulatory Visit | Attending: Gastroenterology | Admitting: Gastroenterology

## 2020-09-03 DIAGNOSIS — Z01812 Encounter for preprocedural laboratory examination: Secondary | ICD-10-CM | POA: Insufficient documentation

## 2020-09-03 DIAGNOSIS — Z20822 Contact with and (suspected) exposure to covid-19: Secondary | ICD-10-CM | POA: Diagnosis not present

## 2020-09-04 LAB — SARS CORONAVIRUS 2 (TAT 6-24 HRS): SARS Coronavirus 2: NEGATIVE

## 2020-09-07 ENCOUNTER — Ambulatory Visit: Payer: Medicare HMO | Admitting: Anesthesiology

## 2020-09-07 ENCOUNTER — Other Ambulatory Visit: Payer: Self-pay

## 2020-09-07 ENCOUNTER — Encounter
Admission: RE | Disposition: A | Discharge status: Home / Self Care | Payer: Self-pay | Source: Ambulatory Visit | Attending: Gastroenterology

## 2020-09-07 ENCOUNTER — Ambulatory Visit
Admission: RE | Admit: 2020-09-07 | Discharge: 2020-09-07 | Disposition: A | Discharge status: Home / Self Care | Payer: Medicare HMO | Source: Ambulatory Visit | Attending: Gastroenterology | Admitting: Gastroenterology

## 2020-09-07 DIAGNOSIS — Z538 Procedure and treatment not carried out for other reasons: Secondary | ICD-10-CM | POA: Diagnosis not present

## 2020-09-07 DIAGNOSIS — Z1211 Encounter for screening for malignant neoplasm of colon: Secondary | ICD-10-CM | POA: Diagnosis present

## 2020-09-07 DIAGNOSIS — J449 Chronic obstructive pulmonary disease, unspecified: Secondary | ICD-10-CM | POA: Insufficient documentation

## 2020-09-07 DIAGNOSIS — Z7951 Long term (current) use of inhaled steroids: Secondary | ICD-10-CM | POA: Insufficient documentation

## 2020-09-07 DIAGNOSIS — Z79899 Other long term (current) drug therapy: Secondary | ICD-10-CM | POA: Insufficient documentation

## 2020-09-07 DIAGNOSIS — Z86010 Personal history of colonic polyps: Secondary | ICD-10-CM | POA: Diagnosis not present

## 2020-09-07 DIAGNOSIS — Z7982 Long term (current) use of aspirin: Secondary | ICD-10-CM | POA: Diagnosis not present

## 2020-09-07 DIAGNOSIS — Z87891 Personal history of nicotine dependence: Secondary | ICD-10-CM | POA: Insufficient documentation

## 2020-09-07 DIAGNOSIS — I69398 Other sequelae of cerebral infarction: Secondary | ICD-10-CM | POA: Diagnosis not present

## 2020-09-07 DIAGNOSIS — Z9981 Dependence on supplemental oxygen: Secondary | ICD-10-CM | POA: Diagnosis not present

## 2020-09-07 DIAGNOSIS — R531 Weakness: Secondary | ICD-10-CM | POA: Diagnosis not present

## 2020-09-07 HISTORY — DX: Anemia, unspecified: D64.9

## 2020-09-07 HISTORY — PX: COLONOSCOPY: SHX5424

## 2020-09-07 HISTORY — DX: Personal history of urinary calculi: Z87.442

## 2020-09-07 SURGERY — COLONOSCOPY
Anesthesia: General

## 2020-09-07 MED ORDER — PROPOFOL 10 MG/ML IV BOLUS
INTRAVENOUS | Status: DC | PRN
  Administered 2020-09-07: 60 mg via INTRAVENOUS

## 2020-09-07 MED ORDER — LIDOCAINE HCL (PF) 2 % IJ SOLN
INTRAMUSCULAR | Status: AC
  Filled 2020-09-07: qty 5

## 2020-09-07 MED ORDER — GLYCOPYRROLATE 0.2 MG/ML IJ SOLN
INTRAMUSCULAR | Status: DC | PRN
  Administered 2020-09-07: .1 mg via INTRAVENOUS

## 2020-09-07 MED ORDER — PROPOFOL 500 MG/50ML IV EMUL
INTRAVENOUS | Status: AC
  Filled 2020-09-07: qty 50

## 2020-09-07 MED ORDER — LIDOCAINE HCL (CARDIAC) PF 100 MG/5ML IV SOSY
PREFILLED_SYRINGE | INTRAVENOUS | Status: DC | PRN
  Administered 2020-09-07: 100 mg via INTRAVENOUS

## 2020-09-07 MED ORDER — GLYCOPYRROLATE 0.2 MG/ML IJ SOLN
INTRAMUSCULAR | Status: AC
  Filled 2020-09-07: qty 1

## 2020-09-07 MED ORDER — SODIUM CHLORIDE 0.9 % IV SOLN: INTRAVENOUS | Status: DC

## 2020-09-07 MED ORDER — DEXMEDETOMIDINE HCL 200 MCG/2ML IV SOLN
INTRAVENOUS | Status: DC | PRN
  Administered 2020-09-07: 16 ug via INTRAVENOUS

## 2020-09-07 MED ORDER — PROPOFOL 500 MG/50ML IV EMUL
INTRAVENOUS | Status: DC | PRN
  Administered 2020-09-07: 150 ug/kg/min via INTRAVENOUS

## 2020-09-07 MED ORDER — EPHEDRINE 5 MG/ML INJ
INTRAVENOUS | Status: AC
  Filled 2020-09-07: qty 10

## 2020-09-07 MED ORDER — KETAMINE HCL 10 MG/ML IJ SOLN
INTRAMUSCULAR | Status: DC | PRN
  Administered 2020-09-07: 20 mg via INTRAVENOUS

## 2020-09-07 MED ORDER — KETAMINE HCL 50 MG/5ML IJ SOSY
PREFILLED_SYRINGE | INTRAMUSCULAR | Status: AC
  Filled 2020-09-07: qty 5

## 2020-09-07 NOTE — Anesthesia Postprocedure Evaluation (Signed)
Anesthesia Post Note  Patient: Audrey Sexton  Procedure(s) Performed: COLONOSCOPY (N/A )  Patient location during evaluation: Endoscopy Anesthesia Type: General Level of consciousness: awake Pain management: pain level controlled Vital Signs Assessment: post-procedure vital signs reviewed and stable Respiratory status: spontaneous breathing Cardiovascular status: blood pressure returned to baseline and stable Postop Assessment: no apparent nausea or vomiting Anesthetic complications: no   No complications documented.   Last Vitals:  Vitals:   09/07/20 1058 09/07/20 1219  BP: 118/80 (!) 105/49  Pulse: 81 79  Resp: 16 19  Temp: 36.7 C 36.4 C  SpO2: 92% 93%    Last Pain:  Vitals:   09/07/20 1219  TempSrc: Temporal  PainSc: Asleep                 Emilio Math

## 2020-09-07 NOTE — Interval H&P Note (Signed)
History and Physical Interval Note:  09/07/2020 11:53 AM  Audrey Sexton  has presented today for surgery, with the diagnosis of PERSONAL HX.OF COLON POLYPS.  The various methods of treatment have been discussed with the patient and family. After consideration of risks, benefits and other options for treatment, the patient has consented to  Procedure(s): COLONOSCOPY (N/A) as a surgical intervention.  The patient's history has been reviewed, patient examined, no change in status, stable for surgery.  I have reviewed the patient's chart and labs.  Questions were answered to the patient's satisfaction.     Regis Bill  Ok to proceed with colonoscopy

## 2020-09-07 NOTE — H&P (Signed)
Outpatient short stay form Pre-procedure 09/07/2020 11:51 AM Audrey Lot MD, MPH  Primary Physician: Dr. Nathanial Rancher  Reason for visit:  Personal History of polyps  History of present illness:   70 y/o lady with COPD and history of polyps here for surveillance colonoscopy. No abdominal surgeries. No blood thinners. No family history of GI malignancies.    Current Facility-Administered Medications:  .  0.9 %  sodium chloride infusion, , Intravenous, Continuous, Ludmilla Mcgillis, Rossie Muskrat, MD, Last Rate: 20 mL/hr at 09/07/20 1121, New Bag at 09/07/20 1121  Medications Prior to Admission  Medication Sig Dispense Refill Last Dose  . albuterol (PROVENTIL HFA;VENTOLIN HFA) 108 (90 Base) MCG/ACT inhaler Inhale 2-4 puffs by mouth every 4 hours as needed for wheezing, cough, and/or shortness of breath 1 Inhaler 1 09/06/2020 at Unknown time  . Ascorbic Acid (VITAMIN C) 1000 MG tablet Take 1,000 mg by mouth daily.   Past Week at Unknown time  . aspirin EC 81 MG EC tablet Take 1 tablet (81 mg total) by mouth daily.   09/06/2020 at Unknown time  . atorvastatin (LIPITOR) 40 MG tablet Take 40 mg by mouth at bedtime.    09/06/2020 at Unknown time  . Cholecalciferol 25 MCG (1000 UT) tablet Take 1,000 Units by mouth daily.   Past Week at Unknown time  . diltiazem (CARDIZEM CD) 240 MG 24 hr capsule Take 240 mg by mouth daily.   09/06/2020 at Unknown time  . fluticasone furoate-vilanterol (BREO ELLIPTA) 100-25 MCG/INH AEPB Inhale 1 puff into the lungs daily.   09/06/2020 at Unknown time  . ipratropium (ATROVENT) 0.02 % nebulizer solution Take 0.5 mg by nebulization 2 (two) times daily as needed for wheezing or shortness of breath.   09/06/2020 at Unknown time  . ipratropium-albuterol (DUONEB) 0.5-2.5 (3) MG/3ML SOLN Take 3 mLs by nebulization every 6 (six) hours as needed. 360 mL 1 09/06/2020 at Unknown time  . lisinopril-hydrochlorothiazide (ZESTORETIC) 20-12.5 MG tablet Take 1 tablet by mouth daily.   09/07/2020 at Unknown  time  . Multiple Vitamin (MULTI-VITAMIN DAILY PO) Take 1 tablet by mouth daily.   Past Week at Unknown time  . omeprazole (PRILOSEC) 20 MG capsule Take 20 mg by mouth daily.   Past Week at Unknown time  . oxybutynin (DITROPAN) 5 MG tablet Take 5 mg by mouth daily.   Past Week at Unknown time  . PARoxetine (PAXIL) 20 MG tablet Take 20 mg by mouth daily.   Past Week at Unknown time  . potassium chloride SA (K-DUR,KLOR-CON) 20 MEQ tablet Take 1 tablet (20 mEq total) by mouth daily. 15 tablet 0 09/06/2020 at Unknown time  . tiotropium (SPIRIVA) 18 MCG inhalation capsule Place 18 mcg into inhaler and inhale daily.   09/06/2020 at Unknown time  . umeclidinium bromide (INCRUSE ELLIPTA) 62.5 MCG/INH AEPB Inhale 1 puff into the lungs daily.   09/06/2020 at Unknown time  . vitamin B-12 (CYANOCOBALAMIN) 1000 MCG tablet Take 1,000 mcg by mouth daily.    Past Week at Unknown time  . diltiazem (CARDIZEM CD) 300 MG 24 hr capsule Take 1 capsule (300 mg total) by mouth at bedtime. 30 capsule 1   . furosemide (LASIX) 40 MG tablet Take 40 mg by mouth daily. (Patient not taking: Reported on 09/07/2020)   Not Taking at Unknown time  . guaiFENesin (MUCINEX) 600 MG 12 hr tablet Take 1 tablet (600 mg total) by mouth 2 (two) times daily. 10 tablet 0   . meloxicam (MOBIC) 15 MG tablet  Take 15 mg by mouth daily. (Patient not taking: Reported on 09/07/2020)   Not Taking at Unknown time  . oxybutynin (DITROPAN XL) 15 MG 24 hr tablet Take 15 mg by mouth daily.     . predniSONE (DELTASONE) 20 MG tablet Take 1 tablet (20 mg total) by mouth daily with breakfast. (Patient not taking: Reported on 09/07/2020) 16 tablet 0 Not Taking at Unknown time  . venlafaxine XR (EFFEXOR-XR) 150 MG 24 hr capsule Take 150 mg by mouth at bedtime.         Allergies  Allergen Reactions  . Percocet [Oxycodone-Acetaminophen] Nausea And Vomiting  . Vicodin [Hydrocodone-Acetaminophen] Nausea And Vomiting  . Penicillins Hives    Has patient had a PCN  reaction causing immediate rash, facial/tongue/throat swelling, SOB or lightheadedness with hypotension: no Has patient had a PCN reaction causing severe rash involving mucus membranes or skin necrosis: no Has patient had a PCN reaction that required hospitalization  no Has patient had a PCN reaction occurring within the last 10 years: no If all of the above answers are "NO", then may proceed with Cephalosporin use.      Past Medical History:  Diagnosis Date  . Anemia   . Asthma   . Carpal tunnel syndrome of left wrist   . COPD (chronic obstructive pulmonary disease) (HCC)   . History of kidney stones   . Hyperlipemia   . Hypertension   . Stroke [facility])    Jan. 2020    Review of systems:  Otherwise negative.    Physical Exam  Gen: Alert, oriented. Appears stated age.  HEENT: PERRLA. Lungs: No respiratory distress CV: RRR Abd: soft, benign, no masses Ext: No edema    Planned procedures: Proceed with colonoscopy. The patient understands the nature of the planned procedure, indications, risks, alternatives and potential complications including but not limited to bleeding, infection, perforation, damage to internal organs and possible oversedation/side effects from anesthesia. The patient agrees and gives consent to proceed.  Please refer to procedure notes for findings, recommendations and patient disposition/instructions.     Audrey Lot MD, MPH Gastroenterology 09/07/2020  11:51 AM

## 2020-09-07 NOTE — Transfer of Care (Signed)
Immediate Anesthesia Transfer of Care Note  Patient: Audrey Sexton  Procedure(s) Performed: COLONOSCOPY (N/A )  Patient Location: PACU and Endoscopy Unit  Anesthesia Type:General  Level of Consciousness: drowsy  Airway & Oxygen Therapy: Patient Spontanous Breathing and Patient connected to nasal cannula oxygen  Post-op Assessment: Report given to RN and Post -op Vital signs reviewed and stable  Post vital signs: Reviewed and stable  Last Vitals:  Vitals Value Taken Time  BP 105/49 (65)   Temp    Pulse 81   Resp 16   SpO2 92%     Last Pain:  Vitals:   09/07/20 1058  TempSrc: Temporal         Complications: No complications documented.

## 2020-09-07 NOTE — Op Note (Signed)
[facility] Gastroenterology Patient Name: Audrey Sexton Procedure Date: 09/07/2020 11:59 AM MRN: [id number] Account #: [id number] Date of Birth: March 11, 1951 Admit Type: Outpatient Age: 70 Room: [facility] ENDO ROOM 1 Gender: Female Note Status: Finalized Procedure:             Colonoscopy Indications:           High risk colon cancer surveillance: Personal history                         of colonic polyps Providers:             Andrey Farmer MD, MD Referring MD:          Dominica Severin (Referring MD) Medicines:             Monitored Anesthesia Care Complications:         No immediate complications. Procedure:             Pre-Anesthesia Assessment:                        - Prior to the procedure, a History and Physical was                         performed, and patient medications and allergies were                         reviewed. The patient is competent. The risks and                         benefits of the procedure and the sedation options and                         risks were discussed with the patient. All questions                         were answered and informed consent was obtained.                         Patient identification and proposed procedure were                         verified by the physician, the nurse, the anesthetist                         and the technician in the endoscopy suite. Mental                         Status Examination: alert and oriented. Airway                         Examination: normal oropharyngeal airway and neck                         mobility. Respiratory Examination: clear to                         auscultation. CV Examination: normal. Prophylactic  Antibiotics: The patient does not require prophylactic                         antibiotics. Prior Anticoagulants: The patient has                         taken no previous anticoagulant or antiplatelet                         agents. ASA Grade  Assessment: III - A patient with                         severe systemic disease. After reviewing the risks and                         benefits, the patient was deemed in satisfactory                         condition to undergo the procedure. The anesthesia                         plan was to use monitored anesthesia care (MAC).                         Immediately prior to administration of medications,                         the patient was re-assessed for adequacy to receive                         sedatives. The heart rate, respiratory rate, oxygen                         saturations, blood pressure, adequacy of pulmonary                         ventilation, and response to care were monitored                         throughout the procedure. The physical status of the                         patient was re-assessed after the procedure.                        After obtaining informed consent, the colonoscope was                         passed under direct vision. Throughout the procedure,                         the patient's blood pressure, pulse, and oxygen                         saturations were monitored continuously. The                         Colonoscope was introduced through the anus with the  intention of advancing to the cecum. The scope was                         advanced to the descending colon before the procedure                         was aborted. Medications were given. The colonoscopy                         was aborted due to poor bowel prep. Findings:      The perianal and digital rectal examinations were normal.      A large amount of liquid stool was found in the sigmoid colon and in the       descending colon, precluding visualization. Impression:            - The procedure was aborted due to poor bowel prep.                        - Stool in the sigmoid colon and in the descending                         colon.                         - No specimens collected. Recommendation:        - Discharge patient to home.                        - Resume previous diet.                        - Continue present medications.                        - Repeat colonoscopy at the next available appointment                         because the bowel preparation was suboptimal.                        - Return to referring physician as previously                         scheduled. Procedure Code(s):     --- Professional ---                        E3154, 53, Colorectal cancer screening; colonoscopy on                         individual at high risk Diagnosis Code(s):     --- Professional ---                        Z86.010, Personal history of colonic polyps CPT copyright 2019 [employer]. All rights reserved. The codes documented in this report are preliminary and upon coder review may  be revised to meet current compliance requirements. Andrey Farmer MD, MD 09/07/2020 12:16:52 PM Number of Addenda: 0 Note Initiated On: 09/07/2020 11:59 AM Estimated Blood Loss:  Estimated blood loss: none.  [facility]

## 2020-09-07 NOTE — Brief Op Note (Signed)
Colonoscopy aborted at 50cm due to poor prep with poor visualization

## 2020-09-07 NOTE — Anesthesia Preprocedure Evaluation (Signed)
Anesthesia Evaluation  Patient identified by MRN, date of birth, ID band Patient awake    Reviewed: Allergy & Precautions, NPO status , Patient's Chart, lab work & pertinent test results  History of Anesthesia Complications Negative for: history of anesthetic complications  Airway Mallampati: III       Dental  (+) Poor Dentition, Missing, Loose, Chipped   Pulmonary asthma , neg sleep apnea, COPD,  COPD inhaler and oxygen dependent, former smoker,           Cardiovascular hypertension, Pt. 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), Residual Symptoms    GI/Hepatic Neg liver ROS, GERD  Poorly Controlled,  Endo/Other  neg diabetes  Renal/GU negative Renal ROS     Musculoskeletal   Abdominal   Peds  Hematology  (+) anemia ,   Anesthesia Other Findings   Reproductive/Obstetrics                             Anesthesia Physical Anesthesia Plan  ASA: III  Anesthesia Plan: General   Post-op Pain Management:    Induction: Intravenous  PONV Risk Score and Plan: 3 and Propofol infusion and TIVA  Airway Management Planned: Nasal Cannula  Additional Equipment:   Intra-op Plan:   Post-operative Plan:   Informed Consent: I have reviewed the patients History and Physical, chart, labs and discussed the procedure including the risks, benefits and alternatives for the proposed anesthesia with the patient or authorized representative who has indicated his/her understanding and acceptance.       Plan Discussed with:   Anesthesia Plan Comments:         Anesthesia Quick Evaluation

## 2020-09-08 ENCOUNTER — Encounter: Payer: Self-pay | Admitting: Gastroenterology

## 2020-10-07 DIAGNOSIS — J9600 Acute respiratory failure, unspecified whether with hypoxia or hypercapnia: Secondary | ICD-10-CM | POA: Diagnosis not present

## 2020-10-07 DIAGNOSIS — J449 Chronic obstructive pulmonary disease, unspecified: Secondary | ICD-10-CM | POA: Diagnosis not present

## 2020-10-07 DIAGNOSIS — J9601 Acute respiratory failure with hypoxia: Secondary | ICD-10-CM | POA: Diagnosis not present

## 2020-10-25 DIAGNOSIS — J449 Chronic obstructive pulmonary disease, unspecified: Secondary | ICD-10-CM | POA: Diagnosis not present

## 2020-10-25 DIAGNOSIS — I10 Essential (primary) hypertension: Secondary | ICD-10-CM | POA: Diagnosis not present

## 2020-10-25 DIAGNOSIS — R049 Hemorrhage from respiratory passages, unspecified: Secondary | ICD-10-CM | POA: Diagnosis not present

## 2020-10-25 DIAGNOSIS — J189 Pneumonia, unspecified organism: Secondary | ICD-10-CM | POA: Diagnosis not present

## 2020-11-04 DIAGNOSIS — J9601 Acute respiratory failure with hypoxia: Secondary | ICD-10-CM | POA: Diagnosis not present

## 2020-11-04 DIAGNOSIS — J9600 Acute respiratory failure, unspecified whether with hypoxia or hypercapnia: Secondary | ICD-10-CM | POA: Diagnosis not present

## 2020-11-04 DIAGNOSIS — J449 Chronic obstructive pulmonary disease, unspecified: Secondary | ICD-10-CM | POA: Diagnosis not present

## 2020-11-15 ENCOUNTER — Emergency Department: Payer: Medicare HMO

## 2020-11-15 ENCOUNTER — Other Ambulatory Visit: Payer: Self-pay

## 2020-11-15 ENCOUNTER — Encounter: Payer: Self-pay | Admitting: Emergency Medicine

## 2020-11-15 ENCOUNTER — Emergency Department
Admission: EM | Admit: 2020-11-15 | Discharge: 2020-11-15 | Disposition: A | Discharge status: Home / Self Care | Payer: Medicare HMO | Attending: Emergency Medicine | Admitting: Emergency Medicine

## 2020-11-15 DIAGNOSIS — R079 Chest pain, unspecified: Secondary | ICD-10-CM | POA: Diagnosis not present

## 2020-11-15 DIAGNOSIS — J45909 Unspecified asthma, uncomplicated: Secondary | ICD-10-CM | POA: Diagnosis not present

## 2020-11-15 DIAGNOSIS — I110 Hypertensive heart disease with heart failure: Secondary | ICD-10-CM | POA: Insufficient documentation

## 2020-11-15 DIAGNOSIS — J449 Chronic obstructive pulmonary disease, unspecified: Secondary | ICD-10-CM | POA: Insufficient documentation

## 2020-11-15 DIAGNOSIS — Z743 Need for continuous supervision: Secondary | ICD-10-CM | POA: Diagnosis not present

## 2020-11-15 DIAGNOSIS — Z7951 Long term (current) use of inhaled steroids: Secondary | ICD-10-CM | POA: Insufficient documentation

## 2020-11-15 DIAGNOSIS — Z79899 Other long term (current) drug therapy: Secondary | ICD-10-CM | POA: Insufficient documentation

## 2020-11-15 DIAGNOSIS — R0689 Other abnormalities of breathing: Secondary | ICD-10-CM | POA: Diagnosis not present

## 2020-11-15 DIAGNOSIS — J189 Pneumonia, unspecified organism: Secondary | ICD-10-CM | POA: Insufficient documentation

## 2020-11-15 DIAGNOSIS — R0602 Shortness of breath: Secondary | ICD-10-CM | POA: Diagnosis present

## 2020-11-15 DIAGNOSIS — Z20822 Contact with and (suspected) exposure to covid-19: Secondary | ICD-10-CM | POA: Diagnosis not present

## 2020-11-15 DIAGNOSIS — R000 Tachycardia, unspecified: Secondary | ICD-10-CM | POA: Diagnosis not present

## 2020-11-15 DIAGNOSIS — I5033 Acute on chronic diastolic (congestive) heart failure: Secondary | ICD-10-CM | POA: Diagnosis not present

## 2020-11-15 DIAGNOSIS — Z87891 Personal history of nicotine dependence: Secondary | ICD-10-CM | POA: Diagnosis not present

## 2020-11-15 DIAGNOSIS — J9611 Chronic respiratory failure with hypoxia: Secondary | ICD-10-CM

## 2020-11-15 DIAGNOSIS — R0789 Other chest pain: Secondary | ICD-10-CM | POA: Diagnosis not present

## 2020-11-15 DIAGNOSIS — J439 Emphysema, unspecified: Secondary | ICD-10-CM | POA: Diagnosis not present

## 2020-11-15 LAB — CBC
HCT: 38.9 % (ref 36.0–46.0)
Hemoglobin: 12.2 g/dL (ref 12.0–15.0)
MCH: 28.8 pg (ref 26.0–34.0)
MCHC: 31.4 g/dL (ref 30.0–36.0)
MCV: 91.7 fL (ref 80.0–100.0)
Platelets: 232 10*3/uL (ref 150–400)
RBC: 4.24 MIL/uL (ref 3.87–5.11)
RDW: 14.4 % (ref 11.5–15.5)
WBC: 11.2 10*3/uL — ABNORMAL HIGH (ref 4.0–10.5)
nRBC: 0 % (ref 0.0–0.2)

## 2020-11-15 LAB — COMPREHENSIVE METABOLIC PANEL
ALT: 17 U/L (ref 0–44)
AST: 23 U/L (ref 15–41)
Albumin: 3.9 g/dL (ref 3.5–5.0)
Alkaline Phosphatase: 55 U/L (ref 38–126)
Anion gap: 8 (ref 5–15)
BUN: 22 mg/dL (ref 8–23)
CO2: 29 mmol/L (ref 22–32)
Calcium: 9.8 mg/dL (ref 8.9–10.3)
Chloride: 103 mmol/L (ref 98–111)
Creatinine, Ser: 1.1 mg/dL — ABNORMAL HIGH (ref 0.44–1.00)
GFR, Estimated: 54 mL/min — ABNORMAL LOW (ref >60)
Glucose, Bld: 170 mg/dL — ABNORMAL HIGH (ref 70–99)
Potassium: 3.6 mmol/L (ref 3.5–5.1)
Sodium: 140 mmol/L (ref 135–145)
Total Bilirubin: 0.5 mg/dL (ref 0.3–1.2)
Total Protein: 7.3 g/dL (ref 6.5–8.1)

## 2020-11-15 LAB — RESP PANEL BY RT-PCR (FLU A&B, COVID) ARPGX2
Influenza A by PCR: NEGATIVE
Influenza B by PCR: NEGATIVE
SARS Coronavirus 2 by RT PCR: NEGATIVE

## 2020-11-15 LAB — TROPONIN I (HIGH SENSITIVITY): Troponin I (High Sensitivity): 7 ng/L (ref <18)

## 2020-11-15 LAB — PROCALCITONIN: Procalcitonin: <0.1 ng/mL

## 2020-11-15 LAB — D-DIMER, QUANTITATIVE: D-Dimer, Quant: 0.58 ug/mL-FEU — ABNORMAL HIGH (ref 0.00–0.50)

## 2020-11-15 LAB — MAGNESIUM: Magnesium: 1.8 mg/dL (ref 1.7–2.4)

## 2020-11-15 MED ORDER — ACETAMINOPHEN 500 MG PO TABS
1000.0000 mg | ORAL_TABLET | Freq: Once | ORAL | Status: AC
  Administered 2020-11-15: 1000 mg via ORAL
  Filled 2020-11-15: qty 2

## 2020-11-15 MED ORDER — MOXIFLOXACIN HCL 400 MG PO TABS: 400.0000 mg | ORAL_TABLET | Freq: Every day | ORAL | 0 refills | Status: AC

## 2020-11-15 MED ORDER — PANTOPRAZOLE SODIUM 40 MG PO TBEC
40.0000 mg | DELAYED_RELEASE_TABLET | Freq: Once | ORAL | Status: AC
  Administered 2020-11-15: 40 mg via ORAL
  Filled 2020-11-15: qty 1

## 2020-11-15 MED ORDER — SUCRALFATE 1 G PO TABS
1.0000 g | ORAL_TABLET | Freq: Once | ORAL | Status: AC
  Administered 2020-11-15: 1 g via ORAL
  Filled 2020-11-15: qty 1

## 2020-11-15 MED ORDER — METHYLPREDNISOLONE SODIUM SUCC 125 MG IJ SOLR
125.0000 mg | Freq: Once | INTRAMUSCULAR | Status: AC
  Administered 2020-11-15: 125 mg via INTRAVENOUS
  Filled 2020-11-15: qty 2

## 2020-11-15 MED ORDER — IPRATROPIUM-ALBUTEROL 0.5-2.5 (3) MG/3ML IN SOLN
9.0000 mL | Freq: Once | RESPIRATORY_TRACT | Status: AC
  Administered 2020-11-15: 9 mL via RESPIRATORY_TRACT
  Filled 2020-11-15: qty 9

## 2020-11-15 MED ORDER — NITROGLYCERIN 0.4 MG SL SUBL: 0.4000 mg | SUBLINGUAL_TABLET | SUBLINGUAL | Status: DC | PRN

## 2020-11-15 MED ORDER — ALUM & MAG HYDROXIDE-SIMETH 200-200-20 MG/5ML PO SUSP
30.0000 mL | Freq: Once | ORAL | Status: AC
  Administered 2020-11-15: 30 mL via ORAL
  Filled 2020-11-15: qty 30

## 2020-11-15 MED ORDER — MOXIFLOXACIN HCL 400 MG PO TABS: 400.0000 mg | ORAL_TABLET | Freq: Every day | ORAL | 0 refills | Status: DC

## 2020-11-15 MED ORDER — SODIUM CHLORIDE 0.9 % IV SOLN
500.0000 mg | Freq: Once | INTRAVENOUS | Status: AC
  Administered 2020-11-15: 500 mg via INTRAVENOUS
  Filled 2020-11-15: qty 500

## 2020-11-15 MED ORDER — PREDNISONE 10 MG PO TABS: 40.0000 mg | ORAL_TABLET | Freq: Every day | ORAL | 0 refills | Status: DC

## 2020-11-15 MED ORDER — SODIUM CHLORIDE 0.9 % IV SOLN
1.0000 g | Freq: Once | INTRAVENOUS | Status: AC
  Administered 2020-11-15: 1 g via INTRAVENOUS
  Filled 2020-11-15: qty 10

## 2020-11-15 MED ORDER — PREDNISONE 10 MG PO TABS: 40.0000 mg | ORAL_TABLET | Freq: Every day | ORAL | 0 refills | Status: AC

## 2020-11-15 NOTE — ED Provider Notes (Signed)
[facility] Emergency Department Provider Note  ____________________________________________   Event Date/Time   First MD Initiated Contact with Patient 11/15/20 1450     (approximate)  I have reviewed the triage vital signs and the nursing notes.   HISTORY  Chief Complaint Chest Pain   HPI Audrey Sexton is a 70 y.o. female with past medical history of chronic hypoxic respiratory failure secondary to COPD and asthma on 3 L at baseline not currently smoking, HTN, HDL, kidney stones and CVA without residual deficits as well as anemia who presents for assessment of an episode of chest pressure and tightness experienced after eating lunch earlier today shortly prior to arrival.  She states initially felt fairly severe but has since eased off.  She denies any other acute sick symptoms including headache, earache, sore throat, nausea, vomiting, diarrhea, dysuria, rash abdominal pain, back pain, change in chronic cough or any increase in chronic shortness of breath.  She has not had any recent injuries or falls.  States he has not smoked in over a decade.  She denies any significant EtOH use or illicit drug use.  She has had a history of reflux in the past although is not currently taking anything for this.         Past Medical History:  Diagnosis Date  . Anemia   . Asthma   . Carpal tunnel syndrome of left wrist   . COPD (chronic obstructive pulmonary disease) (HCC)   . History of kidney stones   . Hyperlipemia   . Hypertension   . Stroke [facility])    Jan. 2020    Patient Active Problem List   Diagnosis Date Noted  . Acute on chronic respiratory failure with hypoxia and hypercapnia (HCC) 02/08/2020  . Hypokalemia 02/08/2020  . COPD with acute exacerbation (HCC) 02/08/2020  . Acute on chronic diastolic CHF (congestive heart failure) (HCC) 02/08/2020  . Hypomagnesemia 02/08/2020  . Sinus tachycardia 02/08/2020  . Right leg weakness 09/06/2018  . HTN  (hypertension) 09/06/2018  . HLD (hyperlipidemia) 09/06/2018  . Stroke (HCC) 09/06/2018  . Acute respiratory distress   . DNR (do not resuscitate) discussion 08/23/2016  . Palliative care by specialist 08/23/2016  . Dyspnea 08/23/2016  . COPD (chronic obstructive pulmonary disease) (HCC) 08/18/2016    Past Surgical History:  Procedure Laterality Date  . CARPAL TUNNEL RELEASE Left   . COLONOSCOPY N/A 09/07/2020   Procedure: COLONOSCOPY;  Surgeon: Regis Bill, MD;  Location: [facility] ENDOSCOPY;  Service: Endoscopy;  Laterality: N/A;  . fractured tibia Left    repair  . ROTATOR CUFF REPAIR Left   . TEE WITHOUT CARDIOVERSION N/A 09/09/2018   Procedure: TRANSESOPHAGEAL ECHOCARDIOGRAM (TEE);  Surgeon: Dalia Heading, MD;  Location: [facility] ORS;  Service: Cardiovascular;  Laterality: N/A;  . TUBAL LIGATION      Prior to Admission medications   Medication Sig Start Date End Date Taking? Authorizing Provider  albuterol (PROVENTIL HFA;VENTOLIN HFA) 108 (90 Base) MCG/ACT inhaler Inhale 2-4 puffs by mouth every 4 hours as needed for wheezing, cough, and/or shortness of breath 01/01/17   Loleta Rose, MD  Ascorbic Acid (VITAMIN C) 1000 MG tablet Take 1,000 mg by mouth daily.    [provider]  aspirin EC 81 MG EC tablet Take 1 tablet (81 mg total) by mouth daily. 09/12/18   Houston Siren, MD  atorvastatin (LIPITOR) 40 MG tablet Take 40 mg by mouth at bedtime.     [provider]  Cholecalciferol 25 MCG (1000 UT) tablet Take 1,000 Units by mouth daily.    [provider]  diltiazem (CARDIZEM CD) 240 MG 24 hr capsule Take 240 mg by mouth daily.    [provider]  diltiazem (CARDIZEM CD) 300 MG 24 hr capsule Take 1 capsule (300 mg total) by mouth at bedtime. 02/08/20   Dhungel, Nishant, MD  fluticasone furoate-vilanterol (BREO ELLIPTA) 100-25 MCG/INH AEPB Inhale 1 puff into the lungs daily. 06/14/17   Milagros Loll, MD  furosemide (LASIX) 40 MG tablet Take 40  mg by mouth daily. Patient not taking: Reported on 09/07/2020    [provider]  guaiFENesin (MUCINEX) 600 MG 12 hr tablet Take 1 tablet (600 mg total) by mouth 2 (two) times daily. 02/08/20   Dhungel, Nishant, MD  ipratropium (ATROVENT) 0.02 % nebulizer solution Take 0.5 mg by nebulization 2 (two) times daily as needed for wheezing or shortness of breath.    [provider]  ipratropium-albuterol (DUONEB) 0.5-2.5 (3) MG/3ML SOLN Take 3 mLs by nebulization every 6 (six) hours as needed. 02/08/20   Dhungel, Theda Belfast, MD  lisinopril-hydrochlorothiazide (ZESTORETIC) 20-12.5 MG tablet Take 1 tablet by mouth daily. 12/29/19   [provider]  meloxicam (MOBIC) 15 MG tablet Take 15 mg by mouth daily. Patient not taking: Reported on 09/07/2020    [provider]  moxifloxacin (AVELOX) 400 MG tablet Take 1 tablet (400 mg total) by mouth daily for 5 days. 11/15/20 11/20/20  Gilles Chiquito, MD  Multiple Vitamin (MULTI-VITAMIN DAILY PO) Take 1 tablet by mouth daily.    [provider]  omeprazole (PRILOSEC) 20 MG capsule Take 20 mg by mouth daily.    [provider]  oxybutynin (DITROPAN XL) 15 MG 24 hr tablet Take 15 mg by mouth daily. 02/03/20   [provider]  oxybutynin (DITROPAN) 5 MG tablet Take 5 mg by mouth daily.    [provider]  PARoxetine (PAXIL) 20 MG tablet Take 20 mg by mouth daily.    [provider]  potassium chloride SA (K-DUR,KLOR-CON) 20 MEQ tablet Take 1 tablet (20 mEq total) by mouth daily. 06/15/17   Milagros Loll, MD  predniSONE (DELTASONE) 10 MG tablet Take 4 tablets (40 mg total) by mouth daily for 5 days. 11/15/20 11/20/20  Gilles Chiquito, MD  tiotropium (SPIRIVA) 18 MCG inhalation capsule Place 18 mcg into inhaler and inhale daily.    [provider]  umeclidinium bromide (INCRUSE ELLIPTA) 62.5 MCG/INH AEPB Inhale 1 puff into the lungs daily.    [provider]  venlafaxine XR  (EFFEXOR-XR) 150 MG 24 hr capsule Take 150 mg by mouth at bedtime.  07/28/18   [provider]  vitamin B-12 (CYANOCOBALAMIN) 1000 MCG tablet Take 1,000 mcg by mouth daily.     [provider]    Allergies Percocet [oxycodone-acetaminophen], Vicodin [hydrocodone-acetaminophen], and Penicillins  Family History  Problem Relation Age of Onset  . Heart disease Mother        died at 10  . Diabetes Mother   . Alzheimer's disease Father        died at 51  . Parkinson's disease Sister   . Heart disease Brother        died at 63  . Diabetes Brother   . Kidney disease Sister   . Breast cancer Sister     Social History Social History   Tobacco Use  . Smoking status: Former Smoker    Packs/day: 1.00  Years: 40.00    Pack years: 40.00    Types: Cigarettes  . Smokeless tobacco: Never Used  Vaping Use  . Vaping Use: Never used  Substance Use Topics  . Alcohol use: No  . Drug use: No    Review of Systems  Review of Systems  Constitutional: Negative for chills and fever.  HENT: Negative for sore throat.   Eyes: Negative for pain.  Respiratory: Negative for cough and stridor.   Cardiovascular: Positive for chest pain.  Gastrointestinal: Negative for vomiting.  Genitourinary: Negative for dysuria.  Musculoskeletal: Negative for myalgias.  Skin: Negative for rash.  Neurological: Negative for seizures, loss of consciousness and headaches.  Psychiatric/Behavioral: Negative for suicidal ideas.  All other systems reviewed and are negative.     ____________________________________________   PHYSICAL EXAM:  VITAL SIGNS: ED Triage Vitals  Enc Vitals Group     BP 11/15/20 1428 (!) 154/117     Pulse Rate 11/15/20 1428 99     Resp 11/15/20 1428 18     Temp 11/15/20 1428 97.9 F (36.6 C)     Temp Source 11/15/20 1428 Oral     SpO2 11/15/20 1428 96 %     Weight 11/15/20 1426 200 lb (90.7 kg)     Height 11/15/20 1426 5\' 4"  (1.626 m)     Head Circumference  --      Peak Flow --      Pain Score 11/15/20 1426 7     Pain Loc --      Pain Edu? --      Excl. in GC? --    Vitals:   11/15/20 1730 11/15/20 1830  BP: (!) 150/77 (!) 147/67  Pulse: 94 (!) 101  Resp: (!) 22 16  Temp:    SpO2: 98% 95%   Physical Exam Vitals and nursing note reviewed.  Constitutional:      General: She is not in acute distress.    Appearance: She is well-developed. She is obese.  HENT:     Head: Normocephalic and atraumatic.     Right Ear: External ear normal.     Nose: Nose normal.  Eyes:     Conjunctiva/sclera: Conjunctivae normal.  Cardiovascular:     Rate and Rhythm: Regular rhythm. Tachycardia present.     Heart sounds: No murmur heard.   Pulmonary:     Effort: Pulmonary effort is normal. Tachypnea present. No respiratory distress.     Breath sounds: Wheezing and rhonchi present.  Abdominal:     Palpations: Abdomen is soft.     Tenderness: There is no abdominal tenderness.  Musculoskeletal:     Cervical back: Neck supple.  Skin:    General: Skin is warm and dry.     Capillary Refill: Capillary refill takes less than 2 seconds.  Neurological:     Mental Status: She is alert and oriented to person, place, and time.  Psychiatric:        Mood and Affect: Mood normal.      ____________________________________________   LABS (all labs ordered are listed, but only abnormal results are displayed)  Labs Reviewed  CBC - Abnormal; Notable for the following components:      Result Value   WBC 11.2 (*)    All other components within normal limits  COMPREHENSIVE METABOLIC PANEL - Abnormal; Notable for the following components:   Glucose, Bld 170 (*)    Creatinine, Ser 1.10 (*)    GFR, Estimated 54 (*)    All  other components within normal limits  D-DIMER, QUANTITATIVE - Abnormal; Notable for the following components:   D-Dimer, Quant 0.58 (*)    All other components within normal limits  RESP PANEL BY RT-PCR (FLU A&B, COVID) ARPGX2  CULTURE,  BLOOD (ROUTINE X 2)  CULTURE, BLOOD (ROUTINE X 2)  MAGNESIUM  PROCALCITONIN  TROPONIN I (HIGH SENSITIVITY)   ____________________________________________  EKG  Sinus tachycardia with ventricular rate of 105, normal axis, unremarkable intervals, no clear evidence of acute ischemia or other significant underlying arrhythmia.  ____________________________________________  RADIOLOGY  ED MD interpretation: No overt edema, large effusion, no thorax but there is evidence of some right basilar opacity suspicious for pneumonia.  Official radiology report(s): DG Chest Port 1 View  Result Date: 11/15/2020 CLINICAL DATA:  Chest pain, worse with moving or coughing EXAM: PORTABLE CHEST 1 VIEW COMPARISON:  02/04/2020 FINDINGS: Advanced emphysematous changes of the lungs. Right basilar airspace opacities suspicious for pneumonia. No significant abnormality of the left lung. Cardiomediastinal silhouette and pulmonary vasculature are within normal limits. IMPRESSION: Right basilar opacities suspicious for pneumonia. Radiographic follow-up to resolution is recommended to exclude underlying mass. Electronically Signed   By: Acquanetta Belling M.D.   On: 11/15/2020 15:10    ____________________________________________   PROCEDURES  Procedure(s) performed (including Critical Care):  .1-3 Lead EKG Interpretation Performed by: Gilles Chiquito, MD Authorized by: Gilles Chiquito, MD     Interpretation: normal     ECG rate assessment: normal     Rhythm: sinus rhythm     Ectopy: none     Conduction: normal       ____________________________________________   INITIAL IMPRESSION / ASSESSMENT AND PLAN / ED COURSE      Patient resents with above-stated exam for assessment of left-sided chest discomfort experienced earlier today.  On arrival she is afebrile and hemodynamically stable on her home 3 L.  She does have some very minimal and extra wheezing in the bilateral lung fields and some rhonchi in the  right lower base.   Differential includes acute heart failure exacerbation, ACS, PE, pneumonia, pleurisy, myocarditis, pericarditis, pneumothorax costochondritis versus possible GI etiologies.  Chest x-ray obtained does show evidence of right lower lobe pneumonia without evidence of volume overload pneumothorax or large effusion.  Patient's D-dimer is 0.58 and is within age-adjusted limits and I have a very low suspicion for PE given no new hypoxia today.  CBC shows mild leukocytosis with WBC count of 11.2 with no acute anemia.  CMP shows no significant electrolyte or metabolic derangements.  Covid influenza is negative.  ECG and troponin obtained greater than 3 hours after symptom onset is not consistent with ACS or myocarditis.   I suspect patient symptoms are likely related to her pneumonia seen on x-ray.  In addition she does have a slightly elevated white blood cell count supporting a possible infectious etiology for symptoms.  Given she had some minimal wheezing on exam she was treated with duo nebs and steroids as well.  On reassessment she stated she felt much better.  She did also receive a GI cocktail given a GI etiologies or with Ortho initially within differential.  Given she has no new hypoxia on her 3 L and her respiratory rate is 16 with otherwise stable vitals and she patient stating she felt much better I believe she is safe for discharge with outpatient follow-up.  Rx written for antibiotics and steroids.  Patient has inhalers at home she can use as needed.  Patient discharged in  stable condition.  Strict return precautions advised and discussed.          ____________________________________________   FINAL CLINICAL IMPRESSION(S) / ED DIAGNOSES  Final diagnoses:  Community acquired pneumonia, unspecified laterality  Chronic respiratory failure with hypoxia (HCC)    Medications  alum & mag hydroxide-simeth (MAALOX/MYLANTA) 200-200-20 MG/5ML suspension 30 mL (30 mLs Oral  Given 11/15/20 1502)  sucralfate (CARAFATE) tablet 1 g (1 g Oral Given 11/15/20 1501)  pantoprazole (PROTONIX) EC tablet 40 mg (40 mg Oral Given 11/15/20 1502)  cefTRIAXone (ROCEPHIN) 1 g in sodium chloride 0.9 % 100 mL IVPB (0 g Intravenous Stopped 11/15/20 1641)  azithromycin (ZITHROMAX) 500 mg in sodium chloride 0.9 % 250 mL IVPB (0 mg Intravenous Stopped 11/15/20 1713)  methylPREDNISolone sodium succinate (SOLU-MEDROL) 125 mg/2 mL injection 125 mg (125 mg Intravenous Given 11/15/20 1724)  ipratropium-albuterol (DUONEB) 0.5-2.5 (3) MG/3ML nebulizer solution 9 mL (9 mLs Nebulization Given 11/15/20 1725)  acetaminophen (TYLENOL) tablet 1,000 mg (1,000 mg Oral Given 11/15/20 1801)     ED Discharge Orders         Ordered    moxifloxacin (AVELOX) 400 MG tablet  Daily,   Status:  Discontinued        11/15/20 1756    predniSONE (DELTASONE) 10 MG tablet  Daily,   Status:  Discontinued        11/15/20 1757    moxifloxacin (AVELOX) 400 MG tablet  Daily        11/15/20 1859    predniSONE (DELTASONE) 10 MG tablet  Daily        11/15/20 1859           Note:  This document was prepared using Dragon voice recognition software and may include unintentional dictation errors.   Gilles ChiquitoSmith, Estelle Greenleaf P, MD 11/16/20 [phone]

## 2020-11-15 NOTE — ED Notes (Signed)
Patient in wheelchair placed in lobby with full tank of O2 waiting on ride. Misty Stanley, First Nurse aware of patient.

## 2020-11-15 NOTE — ED Triage Notes (Signed)
Patient arrives via EMS from home for left sided chest pain. Patient states it hurst worse upon moving and coughing. Patient has a history of hypertension and COPD that she normally wears 3L at home.

## 2020-11-16 ENCOUNTER — Telehealth: Payer: Self-pay | Admitting: Medical Oncology

## 2020-11-16 LAB — BLOOD CULTURE ID PANEL (REFLEXED) - BCID2

## 2020-11-16 NOTE — Telephone Encounter (Signed)
Micro called- pt had one positive blood culture, Dr Vicente Males notified, per MD most likely a contaminate. Also pt was placed on Antibiotic upon discharge that would cover.

## 2020-11-17 LAB — CULTURE, BLOOD (ROUTINE X 2): Special Requests: ADEQUATE

## 2020-11-20 LAB — CULTURE, BLOOD (ROUTINE X 2)
Culture: NO GROWTH
Special Requests: ADEQUATE

## 2020-11-24 DIAGNOSIS — I10 Essential (primary) hypertension: Secondary | ICD-10-CM | POA: Diagnosis not present

## 2020-11-24 DIAGNOSIS — R049 Hemorrhage from respiratory passages, unspecified: Secondary | ICD-10-CM | POA: Diagnosis not present

## 2020-11-24 DIAGNOSIS — J449 Chronic obstructive pulmonary disease, unspecified: Secondary | ICD-10-CM | POA: Diagnosis not present

## 2020-11-24 DIAGNOSIS — J189 Pneumonia, unspecified organism: Secondary | ICD-10-CM | POA: Diagnosis not present

## 2020-12-05 DIAGNOSIS — J9601 Acute respiratory failure with hypoxia: Secondary | ICD-10-CM | POA: Diagnosis not present

## 2020-12-05 DIAGNOSIS — J9600 Acute respiratory failure, unspecified whether with hypoxia or hypercapnia: Secondary | ICD-10-CM | POA: Diagnosis not present

## 2020-12-05 DIAGNOSIS — J449 Chronic obstructive pulmonary disease, unspecified: Secondary | ICD-10-CM | POA: Diagnosis not present

## 2020-12-25 DIAGNOSIS — I10 Essential (primary) hypertension: Secondary | ICD-10-CM | POA: Diagnosis not present

## 2020-12-25 DIAGNOSIS — R049 Hemorrhage from respiratory passages, unspecified: Secondary | ICD-10-CM | POA: Diagnosis not present

## 2020-12-25 DIAGNOSIS — J449 Chronic obstructive pulmonary disease, unspecified: Secondary | ICD-10-CM | POA: Diagnosis not present

## 2021-01-04 DIAGNOSIS — J449 Chronic obstructive pulmonary disease, unspecified: Secondary | ICD-10-CM | POA: Diagnosis not present

## 2021-01-04 DIAGNOSIS — J9601 Acute respiratory failure with hypoxia: Secondary | ICD-10-CM | POA: Diagnosis not present

## 2021-01-04 DIAGNOSIS — J9600 Acute respiratory failure, unspecified whether with hypoxia or hypercapnia: Secondary | ICD-10-CM | POA: Diagnosis not present

## 2021-01-21 ENCOUNTER — Encounter: Payer: Self-pay | Admitting: Registered Nurse

## 2021-01-21 ENCOUNTER — Encounter: Admission: RE | Payer: Self-pay | Source: Ambulatory Visit

## 2021-01-21 ENCOUNTER — Ambulatory Visit: Admission: RE | Admit: 2021-01-21 | Payer: Medicare Other | Source: Ambulatory Visit

## 2021-01-21 SURGERY — COLONOSCOPY WITH PROPOFOL
Anesthesia: General

## 2021-01-24 DIAGNOSIS — I10 Essential (primary) hypertension: Secondary | ICD-10-CM | POA: Diagnosis not present

## 2021-01-24 DIAGNOSIS — R049 Hemorrhage from respiratory passages, unspecified: Secondary | ICD-10-CM | POA: Diagnosis not present

## 2021-01-24 DIAGNOSIS — J449 Chronic obstructive pulmonary disease, unspecified: Secondary | ICD-10-CM | POA: Diagnosis not present

## 2021-02-04 DIAGNOSIS — J9600 Acute respiratory failure, unspecified whether with hypoxia or hypercapnia: Secondary | ICD-10-CM | POA: Diagnosis not present

## 2021-02-04 DIAGNOSIS — J9601 Acute respiratory failure with hypoxia: Secondary | ICD-10-CM | POA: Diagnosis not present

## 2021-02-04 DIAGNOSIS — J449 Chronic obstructive pulmonary disease, unspecified: Secondary | ICD-10-CM | POA: Diagnosis not present

## 2021-02-11 DIAGNOSIS — I700 Atherosclerosis of aorta: Secondary | ICD-10-CM | POA: Diagnosis not present

## 2021-02-11 DIAGNOSIS — I471 Supraventricular tachycardia: Secondary | ICD-10-CM | POA: Diagnosis not present

## 2021-02-11 DIAGNOSIS — I69359 Hemiplegia and hemiparesis following cerebral infarction affecting unspecified side: Secondary | ICD-10-CM | POA: Diagnosis not present

## 2021-02-11 DIAGNOSIS — I10 Essential (primary) hypertension: Secondary | ICD-10-CM | POA: Diagnosis not present

## 2021-02-11 DIAGNOSIS — R7303 Prediabetes: Secondary | ICD-10-CM | POA: Diagnosis not present

## 2021-02-11 DIAGNOSIS — E785 Hyperlipidemia, unspecified: Secondary | ICD-10-CM | POA: Diagnosis not present

## 2021-02-11 DIAGNOSIS — E876 Hypokalemia: Secondary | ICD-10-CM | POA: Diagnosis not present

## 2021-02-11 DIAGNOSIS — D692 Other nonthrombocytopenic purpura: Secondary | ICD-10-CM | POA: Diagnosis not present

## 2021-02-11 DIAGNOSIS — J9611 Chronic respiratory failure with hypoxia: Secondary | ICD-10-CM | POA: Diagnosis not present

## 2021-02-11 DIAGNOSIS — J449 Chronic obstructive pulmonary disease, unspecified: Secondary | ICD-10-CM | POA: Diagnosis not present

## 2021-02-11 DIAGNOSIS — Z79899 Other long term (current) drug therapy: Secondary | ICD-10-CM | POA: Diagnosis not present

## 2021-02-16 DIAGNOSIS — I10 Essential (primary) hypertension: Secondary | ICD-10-CM | POA: Diagnosis not present

## 2021-02-16 DIAGNOSIS — E7800 Pure hypercholesterolemia, unspecified: Secondary | ICD-10-CM | POA: Diagnosis not present

## 2021-02-16 DIAGNOSIS — I69351 Hemiplegia and hemiparesis following cerebral infarction affecting right dominant side: Secondary | ICD-10-CM | POA: Diagnosis not present

## 2021-02-16 DIAGNOSIS — I471 Supraventricular tachycardia: Secondary | ICD-10-CM | POA: Diagnosis not present

## 2021-02-16 DIAGNOSIS — J309 Allergic rhinitis, unspecified: Secondary | ICD-10-CM | POA: Diagnosis not present

## 2021-02-16 DIAGNOSIS — I700 Atherosclerosis of aorta: Secondary | ICD-10-CM | POA: Diagnosis not present

## 2021-02-16 DIAGNOSIS — M1990 Unspecified osteoarthritis, unspecified site: Secondary | ICD-10-CM | POA: Diagnosis not present

## 2021-02-16 DIAGNOSIS — E876 Hypokalemia: Secondary | ICD-10-CM | POA: Diagnosis not present

## 2021-02-16 DIAGNOSIS — K219 Gastro-esophageal reflux disease without esophagitis: Secondary | ICD-10-CM | POA: Diagnosis not present

## 2021-02-16 DIAGNOSIS — Z87891 Personal history of nicotine dependence: Secondary | ICD-10-CM | POA: Diagnosis not present

## 2021-02-16 DIAGNOSIS — J449 Chronic obstructive pulmonary disease, unspecified: Secondary | ICD-10-CM | POA: Diagnosis not present

## 2021-02-16 DIAGNOSIS — J9611 Chronic respiratory failure with hypoxia: Secondary | ICD-10-CM | POA: Diagnosis not present

## 2021-02-16 DIAGNOSIS — Z9981 Dependence on supplemental oxygen: Secondary | ICD-10-CM | POA: Diagnosis not present

## 2021-02-16 DIAGNOSIS — H9193 Unspecified hearing loss, bilateral: Secondary | ICD-10-CM | POA: Diagnosis not present

## 2021-02-16 DIAGNOSIS — R7303 Prediabetes: Secondary | ICD-10-CM | POA: Diagnosis not present

## 2021-02-16 DIAGNOSIS — D692 Other nonthrombocytopenic purpura: Secondary | ICD-10-CM | POA: Diagnosis not present

## 2021-02-20 DIAGNOSIS — H9193 Unspecified hearing loss, bilateral: Secondary | ICD-10-CM | POA: Diagnosis not present

## 2021-02-20 DIAGNOSIS — I471 Supraventricular tachycardia: Secondary | ICD-10-CM | POA: Diagnosis not present

## 2021-02-20 DIAGNOSIS — R7303 Prediabetes: Secondary | ICD-10-CM | POA: Diagnosis not present

## 2021-02-20 DIAGNOSIS — J9611 Chronic respiratory failure with hypoxia: Secondary | ICD-10-CM | POA: Diagnosis not present

## 2021-02-20 DIAGNOSIS — J309 Allergic rhinitis, unspecified: Secondary | ICD-10-CM | POA: Diagnosis not present

## 2021-02-20 DIAGNOSIS — K219 Gastro-esophageal reflux disease without esophagitis: Secondary | ICD-10-CM | POA: Diagnosis not present

## 2021-02-20 DIAGNOSIS — D692 Other nonthrombocytopenic purpura: Secondary | ICD-10-CM | POA: Diagnosis not present

## 2021-02-20 DIAGNOSIS — I10 Essential (primary) hypertension: Secondary | ICD-10-CM | POA: Diagnosis not present

## 2021-02-20 DIAGNOSIS — E7800 Pure hypercholesterolemia, unspecified: Secondary | ICD-10-CM | POA: Diagnosis not present

## 2021-02-20 DIAGNOSIS — I700 Atherosclerosis of aorta: Secondary | ICD-10-CM | POA: Diagnosis not present

## 2021-02-20 DIAGNOSIS — M1990 Unspecified osteoarthritis, unspecified site: Secondary | ICD-10-CM | POA: Diagnosis not present

## 2021-02-20 DIAGNOSIS — I69351 Hemiplegia and hemiparesis following cerebral infarction affecting right dominant side: Secondary | ICD-10-CM | POA: Diagnosis not present

## 2021-02-20 DIAGNOSIS — Z9981 Dependence on supplemental oxygen: Secondary | ICD-10-CM | POA: Diagnosis not present

## 2021-02-20 DIAGNOSIS — E876 Hypokalemia: Secondary | ICD-10-CM | POA: Diagnosis not present

## 2021-02-20 DIAGNOSIS — J449 Chronic obstructive pulmonary disease, unspecified: Secondary | ICD-10-CM | POA: Diagnosis not present

## 2021-02-20 DIAGNOSIS — Z87891 Personal history of nicotine dependence: Secondary | ICD-10-CM | POA: Diagnosis not present

## 2021-02-22 DIAGNOSIS — J449 Chronic obstructive pulmonary disease, unspecified: Secondary | ICD-10-CM | POA: Diagnosis not present

## 2021-02-22 DIAGNOSIS — M1990 Unspecified osteoarthritis, unspecified site: Secondary | ICD-10-CM | POA: Diagnosis not present

## 2021-02-22 DIAGNOSIS — H9193 Unspecified hearing loss, bilateral: Secondary | ICD-10-CM | POA: Diagnosis not present

## 2021-02-22 DIAGNOSIS — J9611 Chronic respiratory failure with hypoxia: Secondary | ICD-10-CM | POA: Diagnosis not present

## 2021-02-22 DIAGNOSIS — I700 Atherosclerosis of aorta: Secondary | ICD-10-CM | POA: Diagnosis not present

## 2021-02-22 DIAGNOSIS — I69351 Hemiplegia and hemiparesis following cerebral infarction affecting right dominant side: Secondary | ICD-10-CM | POA: Diagnosis not present

## 2021-02-22 DIAGNOSIS — Z9981 Dependence on supplemental oxygen: Secondary | ICD-10-CM | POA: Diagnosis not present

## 2021-02-22 DIAGNOSIS — I10 Essential (primary) hypertension: Secondary | ICD-10-CM | POA: Diagnosis not present

## 2021-02-22 DIAGNOSIS — E876 Hypokalemia: Secondary | ICD-10-CM | POA: Diagnosis not present

## 2021-02-22 DIAGNOSIS — I471 Supraventricular tachycardia: Secondary | ICD-10-CM | POA: Diagnosis not present

## 2021-02-22 DIAGNOSIS — D692 Other nonthrombocytopenic purpura: Secondary | ICD-10-CM | POA: Diagnosis not present

## 2021-02-22 DIAGNOSIS — Z87891 Personal history of nicotine dependence: Secondary | ICD-10-CM | POA: Diagnosis not present

## 2021-02-22 DIAGNOSIS — K219 Gastro-esophageal reflux disease without esophagitis: Secondary | ICD-10-CM | POA: Diagnosis not present

## 2021-02-22 DIAGNOSIS — E7800 Pure hypercholesterolemia, unspecified: Secondary | ICD-10-CM | POA: Diagnosis not present

## 2021-02-22 DIAGNOSIS — J309 Allergic rhinitis, unspecified: Secondary | ICD-10-CM | POA: Diagnosis not present

## 2021-02-22 DIAGNOSIS — R7303 Prediabetes: Secondary | ICD-10-CM | POA: Diagnosis not present

## 2021-02-24 DIAGNOSIS — Z9981 Dependence on supplemental oxygen: Secondary | ICD-10-CM | POA: Diagnosis not present

## 2021-02-24 DIAGNOSIS — I471 Supraventricular tachycardia: Secondary | ICD-10-CM | POA: Diagnosis not present

## 2021-02-24 DIAGNOSIS — M1990 Unspecified osteoarthritis, unspecified site: Secondary | ICD-10-CM | POA: Diagnosis not present

## 2021-02-24 DIAGNOSIS — E876 Hypokalemia: Secondary | ICD-10-CM | POA: Diagnosis not present

## 2021-02-24 DIAGNOSIS — R049 Hemorrhage from respiratory passages, unspecified: Secondary | ICD-10-CM | POA: Diagnosis not present

## 2021-02-24 DIAGNOSIS — Z87891 Personal history of nicotine dependence: Secondary | ICD-10-CM | POA: Diagnosis not present

## 2021-02-24 DIAGNOSIS — I700 Atherosclerosis of aorta: Secondary | ICD-10-CM | POA: Diagnosis not present

## 2021-02-24 DIAGNOSIS — R7303 Prediabetes: Secondary | ICD-10-CM | POA: Diagnosis not present

## 2021-02-24 DIAGNOSIS — K219 Gastro-esophageal reflux disease without esophagitis: Secondary | ICD-10-CM | POA: Diagnosis not present

## 2021-02-24 DIAGNOSIS — I69351 Hemiplegia and hemiparesis following cerebral infarction affecting right dominant side: Secondary | ICD-10-CM | POA: Diagnosis not present

## 2021-02-24 DIAGNOSIS — H9193 Unspecified hearing loss, bilateral: Secondary | ICD-10-CM | POA: Diagnosis not present

## 2021-02-24 DIAGNOSIS — D692 Other nonthrombocytopenic purpura: Secondary | ICD-10-CM | POA: Diagnosis not present

## 2021-02-24 DIAGNOSIS — I10 Essential (primary) hypertension: Secondary | ICD-10-CM | POA: Diagnosis not present

## 2021-02-24 DIAGNOSIS — J449 Chronic obstructive pulmonary disease, unspecified: Secondary | ICD-10-CM | POA: Diagnosis not present

## 2021-02-24 DIAGNOSIS — J9611 Chronic respiratory failure with hypoxia: Secondary | ICD-10-CM | POA: Diagnosis not present

## 2021-02-24 DIAGNOSIS — J309 Allergic rhinitis, unspecified: Secondary | ICD-10-CM | POA: Diagnosis not present

## 2021-02-24 DIAGNOSIS — E7800 Pure hypercholesterolemia, unspecified: Secondary | ICD-10-CM | POA: Diagnosis not present

## 2021-03-02 DIAGNOSIS — D692 Other nonthrombocytopenic purpura: Secondary | ICD-10-CM | POA: Diagnosis not present

## 2021-03-02 DIAGNOSIS — I700 Atherosclerosis of aorta: Secondary | ICD-10-CM | POA: Diagnosis not present

## 2021-03-02 DIAGNOSIS — I69351 Hemiplegia and hemiparesis following cerebral infarction affecting right dominant side: Secondary | ICD-10-CM | POA: Diagnosis not present

## 2021-03-02 DIAGNOSIS — I10 Essential (primary) hypertension: Secondary | ICD-10-CM | POA: Diagnosis not present

## 2021-03-02 DIAGNOSIS — K219 Gastro-esophageal reflux disease without esophagitis: Secondary | ICD-10-CM | POA: Diagnosis not present

## 2021-03-02 DIAGNOSIS — E7800 Pure hypercholesterolemia, unspecified: Secondary | ICD-10-CM | POA: Diagnosis not present

## 2021-03-02 DIAGNOSIS — J9611 Chronic respiratory failure with hypoxia: Secondary | ICD-10-CM | POA: Diagnosis not present

## 2021-03-02 DIAGNOSIS — J309 Allergic rhinitis, unspecified: Secondary | ICD-10-CM | POA: Diagnosis not present

## 2021-03-02 DIAGNOSIS — M1990 Unspecified osteoarthritis, unspecified site: Secondary | ICD-10-CM | POA: Diagnosis not present

## 2021-03-02 DIAGNOSIS — J449 Chronic obstructive pulmonary disease, unspecified: Secondary | ICD-10-CM | POA: Diagnosis not present

## 2021-03-02 DIAGNOSIS — R7303 Prediabetes: Secondary | ICD-10-CM | POA: Diagnosis not present

## 2021-03-02 DIAGNOSIS — E876 Hypokalemia: Secondary | ICD-10-CM | POA: Diagnosis not present

## 2021-03-02 DIAGNOSIS — I471 Supraventricular tachycardia: Secondary | ICD-10-CM | POA: Diagnosis not present

## 2021-03-02 DIAGNOSIS — Z9981 Dependence on supplemental oxygen: Secondary | ICD-10-CM | POA: Diagnosis not present

## 2021-03-02 DIAGNOSIS — H9193 Unspecified hearing loss, bilateral: Secondary | ICD-10-CM | POA: Diagnosis not present

## 2021-03-02 DIAGNOSIS — Z87891 Personal history of nicotine dependence: Secondary | ICD-10-CM | POA: Diagnosis not present

## 2021-03-04 DIAGNOSIS — J309 Allergic rhinitis, unspecified: Secondary | ICD-10-CM | POA: Diagnosis not present

## 2021-03-04 DIAGNOSIS — M1990 Unspecified osteoarthritis, unspecified site: Secondary | ICD-10-CM | POA: Diagnosis not present

## 2021-03-04 DIAGNOSIS — I700 Atherosclerosis of aorta: Secondary | ICD-10-CM | POA: Diagnosis not present

## 2021-03-04 DIAGNOSIS — J9611 Chronic respiratory failure with hypoxia: Secondary | ICD-10-CM | POA: Diagnosis not present

## 2021-03-04 DIAGNOSIS — H9193 Unspecified hearing loss, bilateral: Secondary | ICD-10-CM | POA: Diagnosis not present

## 2021-03-04 DIAGNOSIS — Z87891 Personal history of nicotine dependence: Secondary | ICD-10-CM | POA: Diagnosis not present

## 2021-03-04 DIAGNOSIS — R7303 Prediabetes: Secondary | ICD-10-CM | POA: Diagnosis not present

## 2021-03-04 DIAGNOSIS — I10 Essential (primary) hypertension: Secondary | ICD-10-CM | POA: Diagnosis not present

## 2021-03-04 DIAGNOSIS — K219 Gastro-esophageal reflux disease without esophagitis: Secondary | ICD-10-CM | POA: Diagnosis not present

## 2021-03-04 DIAGNOSIS — D692 Other nonthrombocytopenic purpura: Secondary | ICD-10-CM | POA: Diagnosis not present

## 2021-03-04 DIAGNOSIS — E876 Hypokalemia: Secondary | ICD-10-CM | POA: Diagnosis not present

## 2021-03-04 DIAGNOSIS — J449 Chronic obstructive pulmonary disease, unspecified: Secondary | ICD-10-CM | POA: Diagnosis not present

## 2021-03-04 DIAGNOSIS — Z9981 Dependence on supplemental oxygen: Secondary | ICD-10-CM | POA: Diagnosis not present

## 2021-03-04 DIAGNOSIS — E7800 Pure hypercholesterolemia, unspecified: Secondary | ICD-10-CM | POA: Diagnosis not present

## 2021-03-04 DIAGNOSIS — I69351 Hemiplegia and hemiparesis following cerebral infarction affecting right dominant side: Secondary | ICD-10-CM | POA: Diagnosis not present

## 2021-03-04 DIAGNOSIS — I471 Supraventricular tachycardia: Secondary | ICD-10-CM | POA: Diagnosis not present

## 2021-03-06 DIAGNOSIS — J9601 Acute respiratory failure with hypoxia: Secondary | ICD-10-CM | POA: Diagnosis not present

## 2021-03-06 DIAGNOSIS — J449 Chronic obstructive pulmonary disease, unspecified: Secondary | ICD-10-CM | POA: Diagnosis not present

## 2021-03-06 DIAGNOSIS — J9600 Acute respiratory failure, unspecified whether with hypoxia or hypercapnia: Secondary | ICD-10-CM | POA: Diagnosis not present

## 2021-03-09 DIAGNOSIS — Z87891 Personal history of nicotine dependence: Secondary | ICD-10-CM | POA: Diagnosis not present

## 2021-03-09 DIAGNOSIS — H9193 Unspecified hearing loss, bilateral: Secondary | ICD-10-CM | POA: Diagnosis not present

## 2021-03-09 DIAGNOSIS — R7303 Prediabetes: Secondary | ICD-10-CM | POA: Diagnosis not present

## 2021-03-09 DIAGNOSIS — J449 Chronic obstructive pulmonary disease, unspecified: Secondary | ICD-10-CM | POA: Diagnosis not present

## 2021-03-09 DIAGNOSIS — J309 Allergic rhinitis, unspecified: Secondary | ICD-10-CM | POA: Diagnosis not present

## 2021-03-09 DIAGNOSIS — D692 Other nonthrombocytopenic purpura: Secondary | ICD-10-CM | POA: Diagnosis not present

## 2021-03-09 DIAGNOSIS — I10 Essential (primary) hypertension: Secondary | ICD-10-CM | POA: Diagnosis not present

## 2021-03-09 DIAGNOSIS — K219 Gastro-esophageal reflux disease without esophagitis: Secondary | ICD-10-CM | POA: Diagnosis not present

## 2021-03-09 DIAGNOSIS — I700 Atherosclerosis of aorta: Secondary | ICD-10-CM | POA: Diagnosis not present

## 2021-03-09 DIAGNOSIS — Z9981 Dependence on supplemental oxygen: Secondary | ICD-10-CM | POA: Diagnosis not present

## 2021-03-09 DIAGNOSIS — J9611 Chronic respiratory failure with hypoxia: Secondary | ICD-10-CM | POA: Diagnosis not present

## 2021-03-09 DIAGNOSIS — I69351 Hemiplegia and hemiparesis following cerebral infarction affecting right dominant side: Secondary | ICD-10-CM | POA: Diagnosis not present

## 2021-03-09 DIAGNOSIS — I471 Supraventricular tachycardia: Secondary | ICD-10-CM | POA: Diagnosis not present

## 2021-03-09 DIAGNOSIS — E876 Hypokalemia: Secondary | ICD-10-CM | POA: Diagnosis not present

## 2021-03-09 DIAGNOSIS — M1990 Unspecified osteoarthritis, unspecified site: Secondary | ICD-10-CM | POA: Diagnosis not present

## 2021-03-09 DIAGNOSIS — E7800 Pure hypercholesterolemia, unspecified: Secondary | ICD-10-CM | POA: Diagnosis not present

## 2021-03-10 DIAGNOSIS — E876 Hypokalemia: Secondary | ICD-10-CM | POA: Diagnosis not present

## 2021-03-10 DIAGNOSIS — I69351 Hemiplegia and hemiparesis following cerebral infarction affecting right dominant side: Secondary | ICD-10-CM | POA: Diagnosis not present

## 2021-03-10 DIAGNOSIS — M1990 Unspecified osteoarthritis, unspecified site: Secondary | ICD-10-CM | POA: Diagnosis not present

## 2021-03-10 DIAGNOSIS — J9611 Chronic respiratory failure with hypoxia: Secondary | ICD-10-CM | POA: Diagnosis not present

## 2021-03-10 DIAGNOSIS — J309 Allergic rhinitis, unspecified: Secondary | ICD-10-CM | POA: Diagnosis not present

## 2021-03-10 DIAGNOSIS — I10 Essential (primary) hypertension: Secondary | ICD-10-CM | POA: Diagnosis not present

## 2021-03-10 DIAGNOSIS — I700 Atherosclerosis of aorta: Secondary | ICD-10-CM | POA: Diagnosis not present

## 2021-03-10 DIAGNOSIS — I471 Supraventricular tachycardia: Secondary | ICD-10-CM | POA: Diagnosis not present

## 2021-03-10 DIAGNOSIS — R7303 Prediabetes: Secondary | ICD-10-CM | POA: Diagnosis not present

## 2021-03-10 DIAGNOSIS — K219 Gastro-esophageal reflux disease without esophagitis: Secondary | ICD-10-CM | POA: Diagnosis not present

## 2021-03-10 DIAGNOSIS — E7800 Pure hypercholesterolemia, unspecified: Secondary | ICD-10-CM | POA: Diagnosis not present

## 2021-03-10 DIAGNOSIS — J449 Chronic obstructive pulmonary disease, unspecified: Secondary | ICD-10-CM | POA: Diagnosis not present

## 2021-03-10 DIAGNOSIS — Z9981 Dependence on supplemental oxygen: Secondary | ICD-10-CM | POA: Diagnosis not present

## 2021-03-10 DIAGNOSIS — Z87891 Personal history of nicotine dependence: Secondary | ICD-10-CM | POA: Diagnosis not present

## 2021-03-10 DIAGNOSIS — H9193 Unspecified hearing loss, bilateral: Secondary | ICD-10-CM | POA: Diagnosis not present

## 2021-03-10 DIAGNOSIS — D692 Other nonthrombocytopenic purpura: Secondary | ICD-10-CM | POA: Diagnosis not present

## 2021-03-14 DIAGNOSIS — I471 Supraventricular tachycardia: Secondary | ICD-10-CM | POA: Diagnosis not present

## 2021-03-14 DIAGNOSIS — I69351 Hemiplegia and hemiparesis following cerebral infarction affecting right dominant side: Secondary | ICD-10-CM | POA: Diagnosis not present

## 2021-03-14 DIAGNOSIS — K219 Gastro-esophageal reflux disease without esophagitis: Secondary | ICD-10-CM | POA: Diagnosis not present

## 2021-03-14 DIAGNOSIS — H9193 Unspecified hearing loss, bilateral: Secondary | ICD-10-CM | POA: Diagnosis not present

## 2021-03-14 DIAGNOSIS — Z87891 Personal history of nicotine dependence: Secondary | ICD-10-CM | POA: Diagnosis not present

## 2021-03-14 DIAGNOSIS — Z9981 Dependence on supplemental oxygen: Secondary | ICD-10-CM | POA: Diagnosis not present

## 2021-03-14 DIAGNOSIS — M1990 Unspecified osteoarthritis, unspecified site: Secondary | ICD-10-CM | POA: Diagnosis not present

## 2021-03-14 DIAGNOSIS — J309 Allergic rhinitis, unspecified: Secondary | ICD-10-CM | POA: Diagnosis not present

## 2021-03-14 DIAGNOSIS — I700 Atherosclerosis of aorta: Secondary | ICD-10-CM | POA: Diagnosis not present

## 2021-03-14 DIAGNOSIS — R7303 Prediabetes: Secondary | ICD-10-CM | POA: Diagnosis not present

## 2021-03-14 DIAGNOSIS — I10 Essential (primary) hypertension: Secondary | ICD-10-CM | POA: Diagnosis not present

## 2021-03-14 DIAGNOSIS — D692 Other nonthrombocytopenic purpura: Secondary | ICD-10-CM | POA: Diagnosis not present

## 2021-03-14 DIAGNOSIS — J9611 Chronic respiratory failure with hypoxia: Secondary | ICD-10-CM | POA: Diagnosis not present

## 2021-03-14 DIAGNOSIS — J449 Chronic obstructive pulmonary disease, unspecified: Secondary | ICD-10-CM | POA: Diagnosis not present

## 2021-03-14 DIAGNOSIS — E876 Hypokalemia: Secondary | ICD-10-CM | POA: Diagnosis not present

## 2021-03-14 DIAGNOSIS — E7800 Pure hypercholesterolemia, unspecified: Secondary | ICD-10-CM | POA: Diagnosis not present

## 2021-03-21 DIAGNOSIS — J309 Allergic rhinitis, unspecified: Secondary | ICD-10-CM | POA: Diagnosis not present

## 2021-03-21 DIAGNOSIS — Z87891 Personal history of nicotine dependence: Secondary | ICD-10-CM | POA: Diagnosis not present

## 2021-03-21 DIAGNOSIS — I69351 Hemiplegia and hemiparesis following cerebral infarction affecting right dominant side: Secondary | ICD-10-CM | POA: Diagnosis not present

## 2021-03-21 DIAGNOSIS — J9611 Chronic respiratory failure with hypoxia: Secondary | ICD-10-CM | POA: Diagnosis not present

## 2021-03-21 DIAGNOSIS — I700 Atherosclerosis of aorta: Secondary | ICD-10-CM | POA: Diagnosis not present

## 2021-03-21 DIAGNOSIS — J449 Chronic obstructive pulmonary disease, unspecified: Secondary | ICD-10-CM | POA: Diagnosis not present

## 2021-03-21 DIAGNOSIS — E7800 Pure hypercholesterolemia, unspecified: Secondary | ICD-10-CM | POA: Diagnosis not present

## 2021-03-21 DIAGNOSIS — I10 Essential (primary) hypertension: Secondary | ICD-10-CM | POA: Diagnosis not present

## 2021-03-21 DIAGNOSIS — E876 Hypokalemia: Secondary | ICD-10-CM | POA: Diagnosis not present

## 2021-03-21 DIAGNOSIS — M1990 Unspecified osteoarthritis, unspecified site: Secondary | ICD-10-CM | POA: Diagnosis not present

## 2021-03-21 DIAGNOSIS — R7303 Prediabetes: Secondary | ICD-10-CM | POA: Diagnosis not present

## 2021-03-21 DIAGNOSIS — H9193 Unspecified hearing loss, bilateral: Secondary | ICD-10-CM | POA: Diagnosis not present

## 2021-03-21 DIAGNOSIS — K219 Gastro-esophageal reflux disease without esophagitis: Secondary | ICD-10-CM | POA: Diagnosis not present

## 2021-03-21 DIAGNOSIS — I471 Supraventricular tachycardia: Secondary | ICD-10-CM | POA: Diagnosis not present

## 2021-03-21 DIAGNOSIS — Z9981 Dependence on supplemental oxygen: Secondary | ICD-10-CM | POA: Diagnosis not present

## 2021-03-21 DIAGNOSIS — D692 Other nonthrombocytopenic purpura: Secondary | ICD-10-CM | POA: Diagnosis not present

## 2021-03-22 DIAGNOSIS — M1990 Unspecified osteoarthritis, unspecified site: Secondary | ICD-10-CM | POA: Diagnosis not present

## 2021-03-22 DIAGNOSIS — H9193 Unspecified hearing loss, bilateral: Secondary | ICD-10-CM | POA: Diagnosis not present

## 2021-03-22 DIAGNOSIS — I471 Supraventricular tachycardia: Secondary | ICD-10-CM | POA: Diagnosis not present

## 2021-03-22 DIAGNOSIS — Z9981 Dependence on supplemental oxygen: Secondary | ICD-10-CM | POA: Diagnosis not present

## 2021-03-22 DIAGNOSIS — K219 Gastro-esophageal reflux disease without esophagitis: Secondary | ICD-10-CM | POA: Diagnosis not present

## 2021-03-22 DIAGNOSIS — I700 Atherosclerosis of aorta: Secondary | ICD-10-CM | POA: Diagnosis not present

## 2021-03-22 DIAGNOSIS — Z87891 Personal history of nicotine dependence: Secondary | ICD-10-CM | POA: Diagnosis not present

## 2021-03-22 DIAGNOSIS — R7303 Prediabetes: Secondary | ICD-10-CM | POA: Diagnosis not present

## 2021-03-22 DIAGNOSIS — J309 Allergic rhinitis, unspecified: Secondary | ICD-10-CM | POA: Diagnosis not present

## 2021-03-22 DIAGNOSIS — I69351 Hemiplegia and hemiparesis following cerebral infarction affecting right dominant side: Secondary | ICD-10-CM | POA: Diagnosis not present

## 2021-03-22 DIAGNOSIS — D692 Other nonthrombocytopenic purpura: Secondary | ICD-10-CM | POA: Diagnosis not present

## 2021-03-22 DIAGNOSIS — J449 Chronic obstructive pulmonary disease, unspecified: Secondary | ICD-10-CM | POA: Diagnosis not present

## 2021-03-22 DIAGNOSIS — E876 Hypokalemia: Secondary | ICD-10-CM | POA: Diagnosis not present

## 2021-03-22 DIAGNOSIS — I10 Essential (primary) hypertension: Secondary | ICD-10-CM | POA: Diagnosis not present

## 2021-03-22 DIAGNOSIS — E7800 Pure hypercholesterolemia, unspecified: Secondary | ICD-10-CM | POA: Diagnosis not present

## 2021-03-22 DIAGNOSIS — J9611 Chronic respiratory failure with hypoxia: Secondary | ICD-10-CM | POA: Diagnosis not present

## 2021-03-23 DIAGNOSIS — M1990 Unspecified osteoarthritis, unspecified site: Secondary | ICD-10-CM | POA: Diagnosis not present

## 2021-03-23 DIAGNOSIS — I471 Supraventricular tachycardia: Secondary | ICD-10-CM | POA: Diagnosis not present

## 2021-03-23 DIAGNOSIS — D692 Other nonthrombocytopenic purpura: Secondary | ICD-10-CM | POA: Diagnosis not present

## 2021-03-23 DIAGNOSIS — J309 Allergic rhinitis, unspecified: Secondary | ICD-10-CM | POA: Diagnosis not present

## 2021-03-23 DIAGNOSIS — K219 Gastro-esophageal reflux disease without esophagitis: Secondary | ICD-10-CM | POA: Diagnosis not present

## 2021-03-23 DIAGNOSIS — R7303 Prediabetes: Secondary | ICD-10-CM | POA: Diagnosis not present

## 2021-03-23 DIAGNOSIS — E876 Hypokalemia: Secondary | ICD-10-CM | POA: Diagnosis not present

## 2021-03-23 DIAGNOSIS — J9611 Chronic respiratory failure with hypoxia: Secondary | ICD-10-CM | POA: Diagnosis not present

## 2021-03-23 DIAGNOSIS — Z9981 Dependence on supplemental oxygen: Secondary | ICD-10-CM | POA: Diagnosis not present

## 2021-03-23 DIAGNOSIS — I10 Essential (primary) hypertension: Secondary | ICD-10-CM | POA: Diagnosis not present

## 2021-03-23 DIAGNOSIS — I700 Atherosclerosis of aorta: Secondary | ICD-10-CM | POA: Diagnosis not present

## 2021-03-23 DIAGNOSIS — E7800 Pure hypercholesterolemia, unspecified: Secondary | ICD-10-CM | POA: Diagnosis not present

## 2021-03-23 DIAGNOSIS — Z87891 Personal history of nicotine dependence: Secondary | ICD-10-CM | POA: Diagnosis not present

## 2021-03-23 DIAGNOSIS — I69351 Hemiplegia and hemiparesis following cerebral infarction affecting right dominant side: Secondary | ICD-10-CM | POA: Diagnosis not present

## 2021-03-23 DIAGNOSIS — H9193 Unspecified hearing loss, bilateral: Secondary | ICD-10-CM | POA: Diagnosis not present

## 2021-03-23 DIAGNOSIS — J449 Chronic obstructive pulmonary disease, unspecified: Secondary | ICD-10-CM | POA: Diagnosis not present

## 2021-03-24 DIAGNOSIS — Z9981 Dependence on supplemental oxygen: Secondary | ICD-10-CM | POA: Diagnosis not present

## 2021-03-24 DIAGNOSIS — J449 Chronic obstructive pulmonary disease, unspecified: Secondary | ICD-10-CM | POA: Diagnosis not present

## 2021-03-24 DIAGNOSIS — Z87891 Personal history of nicotine dependence: Secondary | ICD-10-CM | POA: Diagnosis not present

## 2021-03-24 DIAGNOSIS — I10 Essential (primary) hypertension: Secondary | ICD-10-CM | POA: Diagnosis not present

## 2021-03-24 DIAGNOSIS — E7800 Pure hypercholesterolemia, unspecified: Secondary | ICD-10-CM | POA: Diagnosis not present

## 2021-03-24 DIAGNOSIS — K219 Gastro-esophageal reflux disease without esophagitis: Secondary | ICD-10-CM | POA: Diagnosis not present

## 2021-03-24 DIAGNOSIS — J309 Allergic rhinitis, unspecified: Secondary | ICD-10-CM | POA: Diagnosis not present

## 2021-03-24 DIAGNOSIS — H9193 Unspecified hearing loss, bilateral: Secondary | ICD-10-CM | POA: Diagnosis not present

## 2021-03-24 DIAGNOSIS — I700 Atherosclerosis of aorta: Secondary | ICD-10-CM | POA: Diagnosis not present

## 2021-03-24 DIAGNOSIS — I471 Supraventricular tachycardia: Secondary | ICD-10-CM | POA: Diagnosis not present

## 2021-03-24 DIAGNOSIS — I69351 Hemiplegia and hemiparesis following cerebral infarction affecting right dominant side: Secondary | ICD-10-CM | POA: Diagnosis not present

## 2021-03-24 DIAGNOSIS — M1990 Unspecified osteoarthritis, unspecified site: Secondary | ICD-10-CM | POA: Diagnosis not present

## 2021-03-24 DIAGNOSIS — J9611 Chronic respiratory failure with hypoxia: Secondary | ICD-10-CM | POA: Diagnosis not present

## 2021-03-24 DIAGNOSIS — R7303 Prediabetes: Secondary | ICD-10-CM | POA: Diagnosis not present

## 2021-03-24 DIAGNOSIS — E876 Hypokalemia: Secondary | ICD-10-CM | POA: Diagnosis not present

## 2021-03-24 DIAGNOSIS — D692 Other nonthrombocytopenic purpura: Secondary | ICD-10-CM | POA: Diagnosis not present

## 2021-03-26 DIAGNOSIS — J449 Chronic obstructive pulmonary disease, unspecified: Secondary | ICD-10-CM | POA: Diagnosis not present

## 2021-03-26 DIAGNOSIS — I10 Essential (primary) hypertension: Secondary | ICD-10-CM | POA: Diagnosis not present

## 2021-03-26 DIAGNOSIS — R049 Hemorrhage from respiratory passages, unspecified: Secondary | ICD-10-CM | POA: Diagnosis not present

## 2021-03-28 DIAGNOSIS — H9193 Unspecified hearing loss, bilateral: Secondary | ICD-10-CM | POA: Diagnosis not present

## 2021-03-28 DIAGNOSIS — Z87891 Personal history of nicotine dependence: Secondary | ICD-10-CM | POA: Diagnosis not present

## 2021-03-28 DIAGNOSIS — D692 Other nonthrombocytopenic purpura: Secondary | ICD-10-CM | POA: Diagnosis not present

## 2021-03-28 DIAGNOSIS — I69351 Hemiplegia and hemiparesis following cerebral infarction affecting right dominant side: Secondary | ICD-10-CM | POA: Diagnosis not present

## 2021-03-28 DIAGNOSIS — J309 Allergic rhinitis, unspecified: Secondary | ICD-10-CM | POA: Diagnosis not present

## 2021-03-28 DIAGNOSIS — I471 Supraventricular tachycardia: Secondary | ICD-10-CM | POA: Diagnosis not present

## 2021-03-28 DIAGNOSIS — R7303 Prediabetes: Secondary | ICD-10-CM | POA: Diagnosis not present

## 2021-03-28 DIAGNOSIS — E7800 Pure hypercholesterolemia, unspecified: Secondary | ICD-10-CM | POA: Diagnosis not present

## 2021-03-28 DIAGNOSIS — I10 Essential (primary) hypertension: Secondary | ICD-10-CM | POA: Diagnosis not present

## 2021-03-28 DIAGNOSIS — E876 Hypokalemia: Secondary | ICD-10-CM | POA: Diagnosis not present

## 2021-03-28 DIAGNOSIS — K219 Gastro-esophageal reflux disease without esophagitis: Secondary | ICD-10-CM | POA: Diagnosis not present

## 2021-03-28 DIAGNOSIS — J9611 Chronic respiratory failure with hypoxia: Secondary | ICD-10-CM | POA: Diagnosis not present

## 2021-03-28 DIAGNOSIS — Z9981 Dependence on supplemental oxygen: Secondary | ICD-10-CM | POA: Diagnosis not present

## 2021-03-28 DIAGNOSIS — M1990 Unspecified osteoarthritis, unspecified site: Secondary | ICD-10-CM | POA: Diagnosis not present

## 2021-03-28 DIAGNOSIS — I700 Atherosclerosis of aorta: Secondary | ICD-10-CM | POA: Diagnosis not present

## 2021-03-28 DIAGNOSIS — J449 Chronic obstructive pulmonary disease, unspecified: Secondary | ICD-10-CM | POA: Diagnosis not present

## 2021-03-30 DIAGNOSIS — J309 Allergic rhinitis, unspecified: Secondary | ICD-10-CM | POA: Diagnosis not present

## 2021-03-30 DIAGNOSIS — I10 Essential (primary) hypertension: Secondary | ICD-10-CM | POA: Diagnosis not present

## 2021-03-30 DIAGNOSIS — I69351 Hemiplegia and hemiparesis following cerebral infarction affecting right dominant side: Secondary | ICD-10-CM | POA: Diagnosis not present

## 2021-03-30 DIAGNOSIS — R7303 Prediabetes: Secondary | ICD-10-CM | POA: Diagnosis not present

## 2021-03-30 DIAGNOSIS — I471 Supraventricular tachycardia: Secondary | ICD-10-CM | POA: Diagnosis not present

## 2021-03-30 DIAGNOSIS — E7800 Pure hypercholesterolemia, unspecified: Secondary | ICD-10-CM | POA: Diagnosis not present

## 2021-03-30 DIAGNOSIS — J9611 Chronic respiratory failure with hypoxia: Secondary | ICD-10-CM | POA: Diagnosis not present

## 2021-03-30 DIAGNOSIS — J449 Chronic obstructive pulmonary disease, unspecified: Secondary | ICD-10-CM | POA: Diagnosis not present

## 2021-03-30 DIAGNOSIS — E876 Hypokalemia: Secondary | ICD-10-CM | POA: Diagnosis not present

## 2021-03-30 DIAGNOSIS — Z87891 Personal history of nicotine dependence: Secondary | ICD-10-CM | POA: Diagnosis not present

## 2021-03-30 DIAGNOSIS — M1990 Unspecified osteoarthritis, unspecified site: Secondary | ICD-10-CM | POA: Diagnosis not present

## 2021-03-30 DIAGNOSIS — I700 Atherosclerosis of aorta: Secondary | ICD-10-CM | POA: Diagnosis not present

## 2021-03-30 DIAGNOSIS — K219 Gastro-esophageal reflux disease without esophagitis: Secondary | ICD-10-CM | POA: Diagnosis not present

## 2021-03-30 DIAGNOSIS — Z9981 Dependence on supplemental oxygen: Secondary | ICD-10-CM | POA: Diagnosis not present

## 2021-03-30 DIAGNOSIS — D692 Other nonthrombocytopenic purpura: Secondary | ICD-10-CM | POA: Diagnosis not present

## 2021-03-30 DIAGNOSIS — H9193 Unspecified hearing loss, bilateral: Secondary | ICD-10-CM | POA: Diagnosis not present

## 2021-03-31 DIAGNOSIS — E7800 Pure hypercholesterolemia, unspecified: Secondary | ICD-10-CM | POA: Diagnosis not present

## 2021-03-31 DIAGNOSIS — J449 Chronic obstructive pulmonary disease, unspecified: Secondary | ICD-10-CM | POA: Diagnosis not present

## 2021-03-31 DIAGNOSIS — D692 Other nonthrombocytopenic purpura: Secondary | ICD-10-CM | POA: Diagnosis not present

## 2021-03-31 DIAGNOSIS — J309 Allergic rhinitis, unspecified: Secondary | ICD-10-CM | POA: Diagnosis not present

## 2021-03-31 DIAGNOSIS — H9193 Unspecified hearing loss, bilateral: Secondary | ICD-10-CM | POA: Diagnosis not present

## 2021-03-31 DIAGNOSIS — I69351 Hemiplegia and hemiparesis following cerebral infarction affecting right dominant side: Secondary | ICD-10-CM | POA: Diagnosis not present

## 2021-03-31 DIAGNOSIS — E876 Hypokalemia: Secondary | ICD-10-CM | POA: Diagnosis not present

## 2021-03-31 DIAGNOSIS — K219 Gastro-esophageal reflux disease without esophagitis: Secondary | ICD-10-CM | POA: Diagnosis not present

## 2021-03-31 DIAGNOSIS — I10 Essential (primary) hypertension: Secondary | ICD-10-CM | POA: Diagnosis not present

## 2021-03-31 DIAGNOSIS — I700 Atherosclerosis of aorta: Secondary | ICD-10-CM | POA: Diagnosis not present

## 2021-03-31 DIAGNOSIS — M1990 Unspecified osteoarthritis, unspecified site: Secondary | ICD-10-CM | POA: Diagnosis not present

## 2021-03-31 DIAGNOSIS — J9611 Chronic respiratory failure with hypoxia: Secondary | ICD-10-CM | POA: Diagnosis not present

## 2021-03-31 DIAGNOSIS — R7303 Prediabetes: Secondary | ICD-10-CM | POA: Diagnosis not present

## 2021-03-31 DIAGNOSIS — I471 Supraventricular tachycardia: Secondary | ICD-10-CM | POA: Diagnosis not present

## 2021-03-31 DIAGNOSIS — Z9981 Dependence on supplemental oxygen: Secondary | ICD-10-CM | POA: Diagnosis not present

## 2021-03-31 DIAGNOSIS — Z87891 Personal history of nicotine dependence: Secondary | ICD-10-CM | POA: Diagnosis not present

## 2021-04-04 DIAGNOSIS — Z87891 Personal history of nicotine dependence: Secondary | ICD-10-CM | POA: Diagnosis not present

## 2021-04-04 DIAGNOSIS — E7800 Pure hypercholesterolemia, unspecified: Secondary | ICD-10-CM | POA: Diagnosis not present

## 2021-04-04 DIAGNOSIS — I69351 Hemiplegia and hemiparesis following cerebral infarction affecting right dominant side: Secondary | ICD-10-CM | POA: Diagnosis not present

## 2021-04-04 DIAGNOSIS — J309 Allergic rhinitis, unspecified: Secondary | ICD-10-CM | POA: Diagnosis not present

## 2021-04-04 DIAGNOSIS — I10 Essential (primary) hypertension: Secondary | ICD-10-CM | POA: Diagnosis not present

## 2021-04-04 DIAGNOSIS — M1990 Unspecified osteoarthritis, unspecified site: Secondary | ICD-10-CM | POA: Diagnosis not present

## 2021-04-04 DIAGNOSIS — H9193 Unspecified hearing loss, bilateral: Secondary | ICD-10-CM | POA: Diagnosis not present

## 2021-04-04 DIAGNOSIS — Z9981 Dependence on supplemental oxygen: Secondary | ICD-10-CM | POA: Diagnosis not present

## 2021-04-04 DIAGNOSIS — R7303 Prediabetes: Secondary | ICD-10-CM | POA: Diagnosis not present

## 2021-04-04 DIAGNOSIS — K219 Gastro-esophageal reflux disease without esophagitis: Secondary | ICD-10-CM | POA: Diagnosis not present

## 2021-04-04 DIAGNOSIS — D692 Other nonthrombocytopenic purpura: Secondary | ICD-10-CM | POA: Diagnosis not present

## 2021-04-04 DIAGNOSIS — E876 Hypokalemia: Secondary | ICD-10-CM | POA: Diagnosis not present

## 2021-04-04 DIAGNOSIS — I700 Atherosclerosis of aorta: Secondary | ICD-10-CM | POA: Diagnosis not present

## 2021-04-04 DIAGNOSIS — I471 Supraventricular tachycardia: Secondary | ICD-10-CM | POA: Diagnosis not present

## 2021-04-04 DIAGNOSIS — J449 Chronic obstructive pulmonary disease, unspecified: Secondary | ICD-10-CM | POA: Diagnosis not present

## 2021-04-04 DIAGNOSIS — J9611 Chronic respiratory failure with hypoxia: Secondary | ICD-10-CM | POA: Diagnosis not present

## 2021-04-05 DIAGNOSIS — E876 Hypokalemia: Secondary | ICD-10-CM | POA: Diagnosis not present

## 2021-04-05 DIAGNOSIS — I471 Supraventricular tachycardia: Secondary | ICD-10-CM | POA: Diagnosis not present

## 2021-04-05 DIAGNOSIS — Z87891 Personal history of nicotine dependence: Secondary | ICD-10-CM | POA: Diagnosis not present

## 2021-04-05 DIAGNOSIS — H9193 Unspecified hearing loss, bilateral: Secondary | ICD-10-CM | POA: Diagnosis not present

## 2021-04-05 DIAGNOSIS — J9611 Chronic respiratory failure with hypoxia: Secondary | ICD-10-CM | POA: Diagnosis not present

## 2021-04-05 DIAGNOSIS — K219 Gastro-esophageal reflux disease without esophagitis: Secondary | ICD-10-CM | POA: Diagnosis not present

## 2021-04-05 DIAGNOSIS — M1990 Unspecified osteoarthritis, unspecified site: Secondary | ICD-10-CM | POA: Diagnosis not present

## 2021-04-05 DIAGNOSIS — J449 Chronic obstructive pulmonary disease, unspecified: Secondary | ICD-10-CM | POA: Diagnosis not present

## 2021-04-05 DIAGNOSIS — I69351 Hemiplegia and hemiparesis following cerebral infarction affecting right dominant side: Secondary | ICD-10-CM | POA: Diagnosis not present

## 2021-04-05 DIAGNOSIS — Z9981 Dependence on supplemental oxygen: Secondary | ICD-10-CM | POA: Diagnosis not present

## 2021-04-05 DIAGNOSIS — J309 Allergic rhinitis, unspecified: Secondary | ICD-10-CM | POA: Diagnosis not present

## 2021-04-05 DIAGNOSIS — I10 Essential (primary) hypertension: Secondary | ICD-10-CM | POA: Diagnosis not present

## 2021-04-05 DIAGNOSIS — R7303 Prediabetes: Secondary | ICD-10-CM | POA: Diagnosis not present

## 2021-04-05 DIAGNOSIS — E7800 Pure hypercholesterolemia, unspecified: Secondary | ICD-10-CM | POA: Diagnosis not present

## 2021-04-05 DIAGNOSIS — I700 Atherosclerosis of aorta: Secondary | ICD-10-CM | POA: Diagnosis not present

## 2021-04-05 DIAGNOSIS — D692 Other nonthrombocytopenic purpura: Secondary | ICD-10-CM | POA: Diagnosis not present

## 2021-04-06 DIAGNOSIS — J9600 Acute respiratory failure, unspecified whether with hypoxia or hypercapnia: Secondary | ICD-10-CM | POA: Diagnosis not present

## 2021-04-06 DIAGNOSIS — J449 Chronic obstructive pulmonary disease, unspecified: Secondary | ICD-10-CM | POA: Diagnosis not present

## 2021-04-06 DIAGNOSIS — J9601 Acute respiratory failure with hypoxia: Secondary | ICD-10-CM | POA: Diagnosis not present

## 2021-04-07 DIAGNOSIS — K219 Gastro-esophageal reflux disease without esophagitis: Secondary | ICD-10-CM | POA: Diagnosis not present

## 2021-04-07 DIAGNOSIS — J449 Chronic obstructive pulmonary disease, unspecified: Secondary | ICD-10-CM | POA: Diagnosis not present

## 2021-04-07 DIAGNOSIS — J309 Allergic rhinitis, unspecified: Secondary | ICD-10-CM | POA: Diagnosis not present

## 2021-04-07 DIAGNOSIS — I10 Essential (primary) hypertension: Secondary | ICD-10-CM | POA: Diagnosis not present

## 2021-04-07 DIAGNOSIS — H9193 Unspecified hearing loss, bilateral: Secondary | ICD-10-CM | POA: Diagnosis not present

## 2021-04-07 DIAGNOSIS — E876 Hypokalemia: Secondary | ICD-10-CM | POA: Diagnosis not present

## 2021-04-07 DIAGNOSIS — I471 Supraventricular tachycardia: Secondary | ICD-10-CM | POA: Diagnosis not present

## 2021-04-07 DIAGNOSIS — M1990 Unspecified osteoarthritis, unspecified site: Secondary | ICD-10-CM | POA: Diagnosis not present

## 2021-04-07 DIAGNOSIS — Z87891 Personal history of nicotine dependence: Secondary | ICD-10-CM | POA: Diagnosis not present

## 2021-04-07 DIAGNOSIS — I700 Atherosclerosis of aorta: Secondary | ICD-10-CM | POA: Diagnosis not present

## 2021-04-07 DIAGNOSIS — D692 Other nonthrombocytopenic purpura: Secondary | ICD-10-CM | POA: Diagnosis not present

## 2021-04-07 DIAGNOSIS — E7800 Pure hypercholesterolemia, unspecified: Secondary | ICD-10-CM | POA: Diagnosis not present

## 2021-04-07 DIAGNOSIS — Z9981 Dependence on supplemental oxygen: Secondary | ICD-10-CM | POA: Diagnosis not present

## 2021-04-07 DIAGNOSIS — I69351 Hemiplegia and hemiparesis following cerebral infarction affecting right dominant side: Secondary | ICD-10-CM | POA: Diagnosis not present

## 2021-04-07 DIAGNOSIS — J9611 Chronic respiratory failure with hypoxia: Secondary | ICD-10-CM | POA: Diagnosis not present

## 2021-04-07 DIAGNOSIS — R7303 Prediabetes: Secondary | ICD-10-CM | POA: Diagnosis not present

## 2021-04-11 DIAGNOSIS — J449 Chronic obstructive pulmonary disease, unspecified: Secondary | ICD-10-CM | POA: Diagnosis not present

## 2021-04-11 DIAGNOSIS — Z9981 Dependence on supplemental oxygen: Secondary | ICD-10-CM | POA: Diagnosis not present

## 2021-04-11 DIAGNOSIS — H9193 Unspecified hearing loss, bilateral: Secondary | ICD-10-CM | POA: Diagnosis not present

## 2021-04-11 DIAGNOSIS — I10 Essential (primary) hypertension: Secondary | ICD-10-CM | POA: Diagnosis not present

## 2021-04-11 DIAGNOSIS — I471 Supraventricular tachycardia: Secondary | ICD-10-CM | POA: Diagnosis not present

## 2021-04-11 DIAGNOSIS — E7800 Pure hypercholesterolemia, unspecified: Secondary | ICD-10-CM | POA: Diagnosis not present

## 2021-04-11 DIAGNOSIS — J9611 Chronic respiratory failure with hypoxia: Secondary | ICD-10-CM | POA: Diagnosis not present

## 2021-04-11 DIAGNOSIS — M1990 Unspecified osteoarthritis, unspecified site: Secondary | ICD-10-CM | POA: Diagnosis not present

## 2021-04-11 DIAGNOSIS — K219 Gastro-esophageal reflux disease without esophagitis: Secondary | ICD-10-CM | POA: Diagnosis not present

## 2021-04-11 DIAGNOSIS — E876 Hypokalemia: Secondary | ICD-10-CM | POA: Diagnosis not present

## 2021-04-11 DIAGNOSIS — R7303 Prediabetes: Secondary | ICD-10-CM | POA: Diagnosis not present

## 2021-04-11 DIAGNOSIS — D692 Other nonthrombocytopenic purpura: Secondary | ICD-10-CM | POA: Diagnosis not present

## 2021-04-11 DIAGNOSIS — Z87891 Personal history of nicotine dependence: Secondary | ICD-10-CM | POA: Diagnosis not present

## 2021-04-11 DIAGNOSIS — I69351 Hemiplegia and hemiparesis following cerebral infarction affecting right dominant side: Secondary | ICD-10-CM | POA: Diagnosis not present

## 2021-04-11 DIAGNOSIS — I700 Atherosclerosis of aorta: Secondary | ICD-10-CM | POA: Diagnosis not present

## 2021-04-11 DIAGNOSIS — J309 Allergic rhinitis, unspecified: Secondary | ICD-10-CM | POA: Diagnosis not present

## 2021-04-13 DIAGNOSIS — E7800 Pure hypercholesterolemia, unspecified: Secondary | ICD-10-CM | POA: Diagnosis not present

## 2021-04-13 DIAGNOSIS — I10 Essential (primary) hypertension: Secondary | ICD-10-CM | POA: Diagnosis not present

## 2021-04-13 DIAGNOSIS — D692 Other nonthrombocytopenic purpura: Secondary | ICD-10-CM | POA: Diagnosis not present

## 2021-04-13 DIAGNOSIS — J449 Chronic obstructive pulmonary disease, unspecified: Secondary | ICD-10-CM | POA: Diagnosis not present

## 2021-04-13 DIAGNOSIS — K219 Gastro-esophageal reflux disease without esophagitis: Secondary | ICD-10-CM | POA: Diagnosis not present

## 2021-04-13 DIAGNOSIS — R7303 Prediabetes: Secondary | ICD-10-CM | POA: Diagnosis not present

## 2021-04-13 DIAGNOSIS — Z9981 Dependence on supplemental oxygen: Secondary | ICD-10-CM | POA: Diagnosis not present

## 2021-04-13 DIAGNOSIS — J9611 Chronic respiratory failure with hypoxia: Secondary | ICD-10-CM | POA: Diagnosis not present

## 2021-04-13 DIAGNOSIS — I471 Supraventricular tachycardia: Secondary | ICD-10-CM | POA: Diagnosis not present

## 2021-04-13 DIAGNOSIS — I700 Atherosclerosis of aorta: Secondary | ICD-10-CM | POA: Diagnosis not present

## 2021-04-13 DIAGNOSIS — M1990 Unspecified osteoarthritis, unspecified site: Secondary | ICD-10-CM | POA: Diagnosis not present

## 2021-04-13 DIAGNOSIS — H9193 Unspecified hearing loss, bilateral: Secondary | ICD-10-CM | POA: Diagnosis not present

## 2021-04-13 DIAGNOSIS — I69351 Hemiplegia and hemiparesis following cerebral infarction affecting right dominant side: Secondary | ICD-10-CM | POA: Diagnosis not present

## 2021-04-13 DIAGNOSIS — E876 Hypokalemia: Secondary | ICD-10-CM | POA: Diagnosis not present

## 2021-04-13 DIAGNOSIS — J309 Allergic rhinitis, unspecified: Secondary | ICD-10-CM | POA: Diagnosis not present

## 2021-04-13 DIAGNOSIS — Z87891 Personal history of nicotine dependence: Secondary | ICD-10-CM | POA: Diagnosis not present

## 2021-04-14 DIAGNOSIS — I471 Supraventricular tachycardia: Secondary | ICD-10-CM | POA: Diagnosis not present

## 2021-04-14 DIAGNOSIS — Z9981 Dependence on supplemental oxygen: Secondary | ICD-10-CM | POA: Diagnosis not present

## 2021-04-14 DIAGNOSIS — J309 Allergic rhinitis, unspecified: Secondary | ICD-10-CM | POA: Diagnosis not present

## 2021-04-14 DIAGNOSIS — R7303 Prediabetes: Secondary | ICD-10-CM | POA: Diagnosis not present

## 2021-04-14 DIAGNOSIS — D692 Other nonthrombocytopenic purpura: Secondary | ICD-10-CM | POA: Diagnosis not present

## 2021-04-14 DIAGNOSIS — E7800 Pure hypercholesterolemia, unspecified: Secondary | ICD-10-CM | POA: Diagnosis not present

## 2021-04-14 DIAGNOSIS — I69351 Hemiplegia and hemiparesis following cerebral infarction affecting right dominant side: Secondary | ICD-10-CM | POA: Diagnosis not present

## 2021-04-14 DIAGNOSIS — Z87891 Personal history of nicotine dependence: Secondary | ICD-10-CM | POA: Diagnosis not present

## 2021-04-14 DIAGNOSIS — K219 Gastro-esophageal reflux disease without esophagitis: Secondary | ICD-10-CM | POA: Diagnosis not present

## 2021-04-14 DIAGNOSIS — J449 Chronic obstructive pulmonary disease, unspecified: Secondary | ICD-10-CM | POA: Diagnosis not present

## 2021-04-14 DIAGNOSIS — I700 Atherosclerosis of aorta: Secondary | ICD-10-CM | POA: Diagnosis not present

## 2021-04-14 DIAGNOSIS — H9193 Unspecified hearing loss, bilateral: Secondary | ICD-10-CM | POA: Diagnosis not present

## 2021-04-14 DIAGNOSIS — J9611 Chronic respiratory failure with hypoxia: Secondary | ICD-10-CM | POA: Diagnosis not present

## 2021-04-14 DIAGNOSIS — M1990 Unspecified osteoarthritis, unspecified site: Secondary | ICD-10-CM | POA: Diagnosis not present

## 2021-04-14 DIAGNOSIS — E876 Hypokalemia: Secondary | ICD-10-CM | POA: Diagnosis not present

## 2021-04-14 DIAGNOSIS — I10 Essential (primary) hypertension: Secondary | ICD-10-CM | POA: Diagnosis not present

## 2021-04-26 DIAGNOSIS — R049 Hemorrhage from respiratory passages, unspecified: Secondary | ICD-10-CM | POA: Diagnosis not present

## 2021-04-26 DIAGNOSIS — J449 Chronic obstructive pulmonary disease, unspecified: Secondary | ICD-10-CM | POA: Diagnosis not present

## 2021-04-26 DIAGNOSIS — I10 Essential (primary) hypertension: Secondary | ICD-10-CM | POA: Diagnosis not present

## 2021-05-07 DIAGNOSIS — J9600 Acute respiratory failure, unspecified whether with hypoxia or hypercapnia: Secondary | ICD-10-CM | POA: Diagnosis not present

## 2021-05-07 DIAGNOSIS — J449 Chronic obstructive pulmonary disease, unspecified: Secondary | ICD-10-CM | POA: Diagnosis not present

## 2021-05-07 DIAGNOSIS — J9601 Acute respiratory failure with hypoxia: Secondary | ICD-10-CM | POA: Diagnosis not present

## 2021-05-27 DIAGNOSIS — J449 Chronic obstructive pulmonary disease, unspecified: Secondary | ICD-10-CM | POA: Diagnosis not present

## 2021-05-27 DIAGNOSIS — I10 Essential (primary) hypertension: Secondary | ICD-10-CM | POA: Diagnosis not present

## 2021-05-27 DIAGNOSIS — R049 Hemorrhage from respiratory passages, unspecified: Secondary | ICD-10-CM | POA: Diagnosis not present

## 2021-06-06 DIAGNOSIS — J449 Chronic obstructive pulmonary disease, unspecified: Secondary | ICD-10-CM | POA: Diagnosis not present

## 2021-06-06 DIAGNOSIS — J9600 Acute respiratory failure, unspecified whether with hypoxia or hypercapnia: Secondary | ICD-10-CM | POA: Diagnosis not present

## 2021-06-06 DIAGNOSIS — J9601 Acute respiratory failure with hypoxia: Secondary | ICD-10-CM | POA: Diagnosis not present

## 2021-06-26 DIAGNOSIS — I10 Essential (primary) hypertension: Secondary | ICD-10-CM | POA: Diagnosis not present

## 2021-06-26 DIAGNOSIS — R049 Hemorrhage from respiratory passages, unspecified: Secondary | ICD-10-CM | POA: Diagnosis not present

## 2021-06-26 DIAGNOSIS — J449 Chronic obstructive pulmonary disease, unspecified: Secondary | ICD-10-CM | POA: Diagnosis not present

## 2021-07-07 DIAGNOSIS — J449 Chronic obstructive pulmonary disease, unspecified: Secondary | ICD-10-CM | POA: Diagnosis not present

## 2021-07-07 DIAGNOSIS — J9601 Acute respiratory failure with hypoxia: Secondary | ICD-10-CM | POA: Diagnosis not present

## 2021-07-07 DIAGNOSIS — J9600 Acute respiratory failure, unspecified whether with hypoxia or hypercapnia: Secondary | ICD-10-CM | POA: Diagnosis not present

## 2021-07-27 DIAGNOSIS — J449 Chronic obstructive pulmonary disease, unspecified: Secondary | ICD-10-CM | POA: Diagnosis not present

## 2021-07-27 DIAGNOSIS — I10 Essential (primary) hypertension: Secondary | ICD-10-CM | POA: Diagnosis not present

## 2021-07-27 DIAGNOSIS — R049 Hemorrhage from respiratory passages, unspecified: Secondary | ICD-10-CM | POA: Diagnosis not present

## 2021-08-06 DIAGNOSIS — J9601 Acute respiratory failure with hypoxia: Secondary | ICD-10-CM | POA: Diagnosis not present

## 2021-08-06 DIAGNOSIS — J9600 Acute respiratory failure, unspecified whether with hypoxia or hypercapnia: Secondary | ICD-10-CM | POA: Diagnosis not present

## 2021-08-06 DIAGNOSIS — J449 Chronic obstructive pulmonary disease, unspecified: Secondary | ICD-10-CM | POA: Diagnosis not present

## 2021-08-26 DIAGNOSIS — R049 Hemorrhage from respiratory passages, unspecified: Secondary | ICD-10-CM | POA: Diagnosis not present

## 2021-08-26 DIAGNOSIS — I10 Essential (primary) hypertension: Secondary | ICD-10-CM | POA: Diagnosis not present

## 2021-08-26 DIAGNOSIS — J449 Chronic obstructive pulmonary disease, unspecified: Secondary | ICD-10-CM | POA: Diagnosis not present

## 2021-09-06 DIAGNOSIS — J9600 Acute respiratory failure, unspecified whether with hypoxia or hypercapnia: Secondary | ICD-10-CM | POA: Diagnosis not present

## 2021-09-06 DIAGNOSIS — J9601 Acute respiratory failure with hypoxia: Secondary | ICD-10-CM | POA: Diagnosis not present

## 2021-09-06 DIAGNOSIS — J449 Chronic obstructive pulmonary disease, unspecified: Secondary | ICD-10-CM | POA: Diagnosis not present

## 2021-09-14 DIAGNOSIS — E876 Hypokalemia: Secondary | ICD-10-CM | POA: Diagnosis not present

## 2021-09-14 DIAGNOSIS — I10 Essential (primary) hypertension: Secondary | ICD-10-CM | POA: Diagnosis not present

## 2021-09-14 DIAGNOSIS — Z139 Encounter for screening, unspecified: Secondary | ICD-10-CM | POA: Diagnosis not present

## 2021-09-14 DIAGNOSIS — N1831 Chronic kidney disease, stage 3a: Secondary | ICD-10-CM | POA: Diagnosis not present

## 2021-09-14 DIAGNOSIS — I471 Supraventricular tachycardia: Secondary | ICD-10-CM | POA: Diagnosis not present

## 2021-09-14 DIAGNOSIS — J9611 Chronic respiratory failure with hypoxia: Secondary | ICD-10-CM | POA: Diagnosis not present

## 2021-09-14 DIAGNOSIS — R202 Paresthesia of skin: Secondary | ICD-10-CM | POA: Diagnosis not present

## 2021-09-14 DIAGNOSIS — Z23 Encounter for immunization: Secondary | ICD-10-CM | POA: Diagnosis not present

## 2021-09-14 DIAGNOSIS — R7303 Prediabetes: Secondary | ICD-10-CM | POA: Diagnosis not present

## 2021-09-14 DIAGNOSIS — J449 Chronic obstructive pulmonary disease, unspecified: Secondary | ICD-10-CM | POA: Diagnosis not present

## 2021-09-14 DIAGNOSIS — I700 Atherosclerosis of aorta: Secondary | ICD-10-CM | POA: Diagnosis not present

## 2021-09-14 DIAGNOSIS — Z79899 Other long term (current) drug therapy: Secondary | ICD-10-CM | POA: Diagnosis not present

## 2021-09-14 DIAGNOSIS — E785 Hyperlipidemia, unspecified: Secondary | ICD-10-CM | POA: Diagnosis not present

## 2021-09-14 DIAGNOSIS — Z9181 History of falling: Secondary | ICD-10-CM | POA: Diagnosis not present

## 2021-09-14 DIAGNOSIS — I69359 Hemiplegia and hemiparesis following cerebral infarction affecting unspecified side: Secondary | ICD-10-CM | POA: Diagnosis not present

## 2021-09-26 DIAGNOSIS — J449 Chronic obstructive pulmonary disease, unspecified: Secondary | ICD-10-CM | POA: Diagnosis not present

## 2021-09-26 DIAGNOSIS — R049 Hemorrhage from respiratory passages, unspecified: Secondary | ICD-10-CM | POA: Diagnosis not present

## 2021-09-26 DIAGNOSIS — I10 Essential (primary) hypertension: Secondary | ICD-10-CM | POA: Diagnosis not present

## 2021-10-25 DIAGNOSIS — J449 Chronic obstructive pulmonary disease, unspecified: Secondary | ICD-10-CM | POA: Diagnosis not present

## 2021-10-25 DIAGNOSIS — I10 Essential (primary) hypertension: Secondary | ICD-10-CM | POA: Diagnosis not present

## 2021-10-25 DIAGNOSIS — R049 Hemorrhage from respiratory passages, unspecified: Secondary | ICD-10-CM | POA: Diagnosis not present

## 2021-10-26 DIAGNOSIS — R7989 Other specified abnormal findings of blood chemistry: Secondary | ICD-10-CM | POA: Diagnosis not present

## 2021-11-10 DIAGNOSIS — E785 Hyperlipidemia, unspecified: Secondary | ICD-10-CM | POA: Diagnosis not present

## 2021-11-10 DIAGNOSIS — Z0000 Encounter for general adult medical examination without abnormal findings: Secondary | ICD-10-CM | POA: Diagnosis not present

## 2021-11-10 DIAGNOSIS — Z9181 History of falling: Secondary | ICD-10-CM | POA: Diagnosis not present

## 2021-11-24 DIAGNOSIS — J449 Chronic obstructive pulmonary disease, unspecified: Secondary | ICD-10-CM | POA: Diagnosis not present

## 2021-11-24 DIAGNOSIS — I10 Essential (primary) hypertension: Secondary | ICD-10-CM | POA: Diagnosis not present

## 2021-11-24 DIAGNOSIS — R049 Hemorrhage from respiratory passages, unspecified: Secondary | ICD-10-CM | POA: Diagnosis not present

## 2021-12-25 DIAGNOSIS — J449 Chronic obstructive pulmonary disease, unspecified: Secondary | ICD-10-CM | POA: Diagnosis not present

## 2021-12-25 DIAGNOSIS — I10 Essential (primary) hypertension: Secondary | ICD-10-CM | POA: Diagnosis not present

## 2021-12-25 DIAGNOSIS — R049 Hemorrhage from respiratory passages, unspecified: Secondary | ICD-10-CM | POA: Diagnosis not present

## 2022-01-24 DIAGNOSIS — J449 Chronic obstructive pulmonary disease, unspecified: Secondary | ICD-10-CM | POA: Diagnosis not present

## 2022-01-24 DIAGNOSIS — I10 Essential (primary) hypertension: Secondary | ICD-10-CM | POA: Diagnosis not present

## 2022-01-24 DIAGNOSIS — R049 Hemorrhage from respiratory passages, unspecified: Secondary | ICD-10-CM | POA: Diagnosis not present

## 2022-03-08 DIAGNOSIS — I10 Essential (primary) hypertension: Secondary | ICD-10-CM | POA: Diagnosis not present

## 2022-03-08 DIAGNOSIS — R049 Hemorrhage from respiratory passages, unspecified: Secondary | ICD-10-CM | POA: Diagnosis not present

## 2022-03-08 DIAGNOSIS — J449 Chronic obstructive pulmonary disease, unspecified: Secondary | ICD-10-CM | POA: Diagnosis not present

## 2022-03-26 DIAGNOSIS — R049 Hemorrhage from respiratory passages, unspecified: Secondary | ICD-10-CM | POA: Diagnosis not present

## 2022-03-26 DIAGNOSIS — J449 Chronic obstructive pulmonary disease, unspecified: Secondary | ICD-10-CM | POA: Diagnosis not present

## 2022-03-26 DIAGNOSIS — I10 Essential (primary) hypertension: Secondary | ICD-10-CM | POA: Diagnosis not present

## 2022-04-08 DIAGNOSIS — I10 Essential (primary) hypertension: Secondary | ICD-10-CM | POA: Diagnosis not present

## 2022-04-08 DIAGNOSIS — J449 Chronic obstructive pulmonary disease, unspecified: Secondary | ICD-10-CM | POA: Diagnosis not present

## 2022-04-08 DIAGNOSIS — R049 Hemorrhage from respiratory passages, unspecified: Secondary | ICD-10-CM | POA: Diagnosis not present

## 2022-04-26 DIAGNOSIS — I10 Essential (primary) hypertension: Secondary | ICD-10-CM | POA: Diagnosis not present

## 2022-04-26 DIAGNOSIS — R049 Hemorrhage from respiratory passages, unspecified: Secondary | ICD-10-CM | POA: Diagnosis not present

## 2022-04-26 DIAGNOSIS — J449 Chronic obstructive pulmonary disease, unspecified: Secondary | ICD-10-CM | POA: Diagnosis not present

## 2022-05-09 DIAGNOSIS — J449 Chronic obstructive pulmonary disease, unspecified: Secondary | ICD-10-CM | POA: Diagnosis not present

## 2022-05-09 DIAGNOSIS — I10 Essential (primary) hypertension: Secondary | ICD-10-CM | POA: Diagnosis not present

## 2022-05-09 DIAGNOSIS — R049 Hemorrhage from respiratory passages, unspecified: Secondary | ICD-10-CM | POA: Diagnosis not present

## 2022-05-12 DIAGNOSIS — E785 Hyperlipidemia, unspecified: Secondary | ICD-10-CM | POA: Diagnosis not present

## 2022-05-12 DIAGNOSIS — J9611 Chronic respiratory failure with hypoxia: Secondary | ICD-10-CM | POA: Diagnosis not present

## 2022-05-12 DIAGNOSIS — N1832 Chronic kidney disease, stage 3b: Secondary | ICD-10-CM | POA: Diagnosis not present

## 2022-05-12 DIAGNOSIS — I10 Essential (primary) hypertension: Secondary | ICD-10-CM | POA: Diagnosis not present

## 2022-05-12 DIAGNOSIS — D692 Other nonthrombocytopenic purpura: Secondary | ICD-10-CM | POA: Diagnosis not present

## 2022-05-12 DIAGNOSIS — R7303 Prediabetes: Secondary | ICD-10-CM | POA: Diagnosis not present

## 2022-05-12 DIAGNOSIS — J449 Chronic obstructive pulmonary disease, unspecified: Secondary | ICD-10-CM | POA: Diagnosis not present

## 2022-05-12 DIAGNOSIS — E876 Hypokalemia: Secondary | ICD-10-CM | POA: Diagnosis not present

## 2022-05-12 DIAGNOSIS — Z23 Encounter for immunization: Secondary | ICD-10-CM | POA: Diagnosis not present

## 2022-05-27 DIAGNOSIS — I10 Essential (primary) hypertension: Secondary | ICD-10-CM | POA: Diagnosis not present

## 2022-05-27 DIAGNOSIS — R049 Hemorrhage from respiratory passages, unspecified: Secondary | ICD-10-CM | POA: Diagnosis not present

## 2022-05-27 DIAGNOSIS — J449 Chronic obstructive pulmonary disease, unspecified: Secondary | ICD-10-CM | POA: Diagnosis not present

## 2022-06-08 DIAGNOSIS — R049 Hemorrhage from respiratory passages, unspecified: Secondary | ICD-10-CM | POA: Diagnosis not present

## 2022-06-08 DIAGNOSIS — I10 Essential (primary) hypertension: Secondary | ICD-10-CM | POA: Diagnosis not present

## 2022-06-08 DIAGNOSIS — J449 Chronic obstructive pulmonary disease, unspecified: Secondary | ICD-10-CM | POA: Diagnosis not present

## 2022-06-16 IMAGING — DX DG CHEST 1V PORT
1 series · 1 of 1 positions shown · non-contrast
Comparison: 02/04/2020

CLINICAL DATA: Chest pain, worse with moving or coughing

EXAM:
PORTABLE CHEST 1 VIEW

[chest ap]
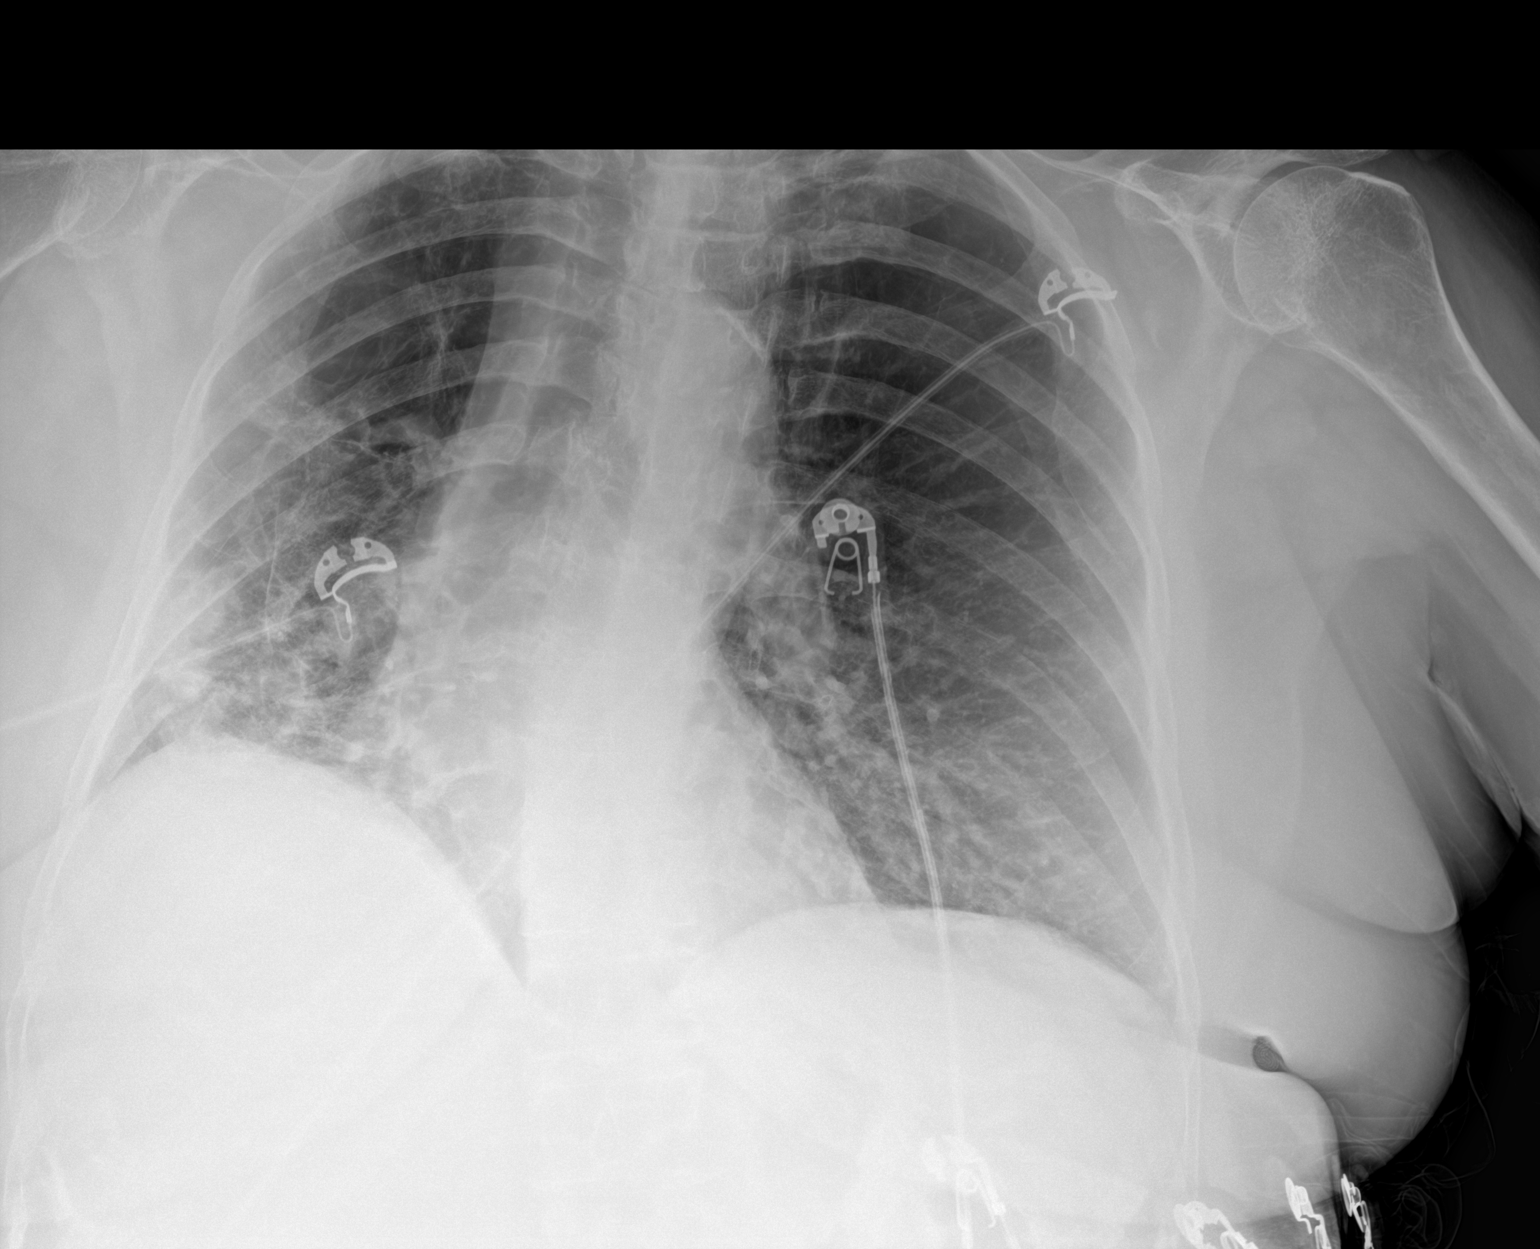

[1 of 1 positions shown; findings below may reference images not displayed]

FINDINGS: Advanced emphysematous changes of the lungs.

Right basilar airspace opacities suspicious for pneumonia. No
significant abnormality of the left lung.

Cardiomediastinal silhouette and pulmonary vasculature are within
normal limits.
IMPRESSION: Right basilar opacities suspicious for pneumonia. Radiographic
follow-up to resolution is recommended to exclude underlying mass.

## 2022-06-26 DIAGNOSIS — R049 Hemorrhage from respiratory passages, unspecified: Secondary | ICD-10-CM | POA: Diagnosis not present

## 2022-06-26 DIAGNOSIS — I10 Essential (primary) hypertension: Secondary | ICD-10-CM | POA: Diagnosis not present

## 2022-06-26 DIAGNOSIS — J449 Chronic obstructive pulmonary disease, unspecified: Secondary | ICD-10-CM | POA: Diagnosis not present

## 2022-07-09 DIAGNOSIS — J449 Chronic obstructive pulmonary disease, unspecified: Secondary | ICD-10-CM | POA: Diagnosis not present

## 2022-07-09 DIAGNOSIS — I10 Essential (primary) hypertension: Secondary | ICD-10-CM | POA: Diagnosis not present

## 2022-07-09 DIAGNOSIS — R049 Hemorrhage from respiratory passages, unspecified: Secondary | ICD-10-CM | POA: Diagnosis not present

## 2022-07-27 DIAGNOSIS — J449 Chronic obstructive pulmonary disease, unspecified: Secondary | ICD-10-CM | POA: Diagnosis not present

## 2022-07-27 DIAGNOSIS — I10 Essential (primary) hypertension: Secondary | ICD-10-CM | POA: Diagnosis not present

## 2022-07-27 DIAGNOSIS — R049 Hemorrhage from respiratory passages, unspecified: Secondary | ICD-10-CM | POA: Diagnosis not present

## 2022-08-04 ENCOUNTER — Telehealth: Payer: Self-pay

## 2022-08-04 NOTE — Patient Outreach (Signed)
  Care Coordination   08/04/2022 Name: Audrey Sexton MRN: [id number] DOB: 04/21/1951   Care Coordination Outreach Attempts:  An unsuccessful telephone outreach was attempted today to offer the patient information about available care coordination services as a benefit of their health plan.   Follow Up Plan:  Additional outreach attempts will be made to offer the patient care coordination information and services.   Encounter Outcome:  No Answer   Care Coordination Interventions:  No, not indicated    Rowe Pavy, RN, BSN, [facility] [employer] [phone]

## 2022-08-08 DIAGNOSIS — I10 Essential (primary) hypertension: Secondary | ICD-10-CM | POA: Diagnosis not present

## 2022-08-08 DIAGNOSIS — J449 Chronic obstructive pulmonary disease, unspecified: Secondary | ICD-10-CM | POA: Diagnosis not present

## 2022-08-08 DIAGNOSIS — R049 Hemorrhage from respiratory passages, unspecified: Secondary | ICD-10-CM | POA: Diagnosis not present

## 2022-08-26 DIAGNOSIS — R049 Hemorrhage from respiratory passages, unspecified: Secondary | ICD-10-CM | POA: Diagnosis not present

## 2022-08-26 DIAGNOSIS — J449 Chronic obstructive pulmonary disease, unspecified: Secondary | ICD-10-CM | POA: Diagnosis not present

## 2022-08-26 DIAGNOSIS — I10 Essential (primary) hypertension: Secondary | ICD-10-CM | POA: Diagnosis not present

## 2022-09-08 DIAGNOSIS — J449 Chronic obstructive pulmonary disease, unspecified: Secondary | ICD-10-CM | POA: Diagnosis not present

## 2022-09-08 DIAGNOSIS — R049 Hemorrhage from respiratory passages, unspecified: Secondary | ICD-10-CM | POA: Diagnosis not present

## 2022-09-08 DIAGNOSIS — I10 Essential (primary) hypertension: Secondary | ICD-10-CM | POA: Diagnosis not present

## 2022-09-14 ENCOUNTER — Observation Stay
Admission: EM | Admit: 2022-09-14 | Discharge: 2022-09-17 | Disposition: A | Discharge status: Home / Self Care | Payer: 59 | Attending: Internal Medicine | Admitting: Internal Medicine

## 2022-09-14 ENCOUNTER — Telehealth: Payer: Self-pay

## 2022-09-14 ENCOUNTER — Other Ambulatory Visit: Payer: Self-pay

## 2022-09-14 DIAGNOSIS — R109 Unspecified abdominal pain: Secondary | ICD-10-CM | POA: Diagnosis not present

## 2022-09-14 DIAGNOSIS — K7689 Other specified diseases of liver: Secondary | ICD-10-CM | POA: Diagnosis not present

## 2022-09-14 DIAGNOSIS — J45909 Unspecified asthma, uncomplicated: Secondary | ICD-10-CM | POA: Diagnosis not present

## 2022-09-14 DIAGNOSIS — K625 Hemorrhage of anus and rectum: Secondary | ICD-10-CM | POA: Insufficient documentation

## 2022-09-14 DIAGNOSIS — Z87891 Personal history of nicotine dependence: Secondary | ICD-10-CM | POA: Insufficient documentation

## 2022-09-14 DIAGNOSIS — I110 Hypertensive heart disease with heart failure: Secondary | ICD-10-CM | POA: Diagnosis not present

## 2022-09-14 DIAGNOSIS — Z79899 Other long term (current) drug therapy: Secondary | ICD-10-CM | POA: Insufficient documentation

## 2022-09-14 DIAGNOSIS — N200 Calculus of kidney: Secondary | ICD-10-CM | POA: Diagnosis not present

## 2022-09-14 DIAGNOSIS — J9611 Chronic respiratory failure with hypoxia: Secondary | ICD-10-CM | POA: Diagnosis not present

## 2022-09-14 DIAGNOSIS — Z7982 Long term (current) use of aspirin: Secondary | ICD-10-CM | POA: Diagnosis not present

## 2022-09-14 DIAGNOSIS — Z8673 Personal history of transient ischemic attack (TIA), and cerebral infarction without residual deficits: Secondary | ICD-10-CM | POA: Insufficient documentation

## 2022-09-14 DIAGNOSIS — I5032 Chronic diastolic (congestive) heart failure: Secondary | ICD-10-CM | POA: Diagnosis present

## 2022-09-14 DIAGNOSIS — I499 Cardiac arrhythmia, unspecified: Secondary | ICD-10-CM | POA: Diagnosis not present

## 2022-09-14 DIAGNOSIS — K921 Melena: Principal | ICD-10-CM | POA: Diagnosis present

## 2022-09-14 DIAGNOSIS — A419 Sepsis, unspecified organism: Secondary | ICD-10-CM

## 2022-09-14 DIAGNOSIS — D72829 Elevated white blood cell count, unspecified: Secondary | ICD-10-CM | POA: Diagnosis present

## 2022-09-14 DIAGNOSIS — R6889 Other general symptoms and signs: Secondary | ICD-10-CM | POA: Diagnosis not present

## 2022-09-14 DIAGNOSIS — D62 Acute posthemorrhagic anemia: Secondary | ICD-10-CM | POA: Insufficient documentation

## 2022-09-14 DIAGNOSIS — K633 Ulcer of intestine: Secondary | ICD-10-CM | POA: Insufficient documentation

## 2022-09-14 DIAGNOSIS — R0902 Hypoxemia: Secondary | ICD-10-CM | POA: Diagnosis not present

## 2022-09-14 DIAGNOSIS — I774 Celiac artery compression syndrome: Secondary | ICD-10-CM | POA: Diagnosis not present

## 2022-09-14 DIAGNOSIS — Z743 Need for continuous supervision: Secondary | ICD-10-CM | POA: Diagnosis not present

## 2022-09-14 DIAGNOSIS — K2970 Gastritis, unspecified, without bleeding: Secondary | ICD-10-CM | POA: Insufficient documentation

## 2022-09-14 DIAGNOSIS — I10 Essential (primary) hypertension: Secondary | ICD-10-CM | POA: Diagnosis present

## 2022-09-14 DIAGNOSIS — J449 Chronic obstructive pulmonary disease, unspecified: Secondary | ICD-10-CM | POA: Diagnosis present

## 2022-09-14 DIAGNOSIS — K922 Gastrointestinal hemorrhage, unspecified: Secondary | ICD-10-CM | POA: Diagnosis not present

## 2022-09-14 DIAGNOSIS — R9431 Abnormal electrocardiogram [ECG] [EKG]: Secondary | ICD-10-CM | POA: Diagnosis not present

## 2022-09-14 LAB — URINALYSIS, ROUTINE W REFLEX MICROSCOPIC
Bacteria, UA: NONE SEEN
Bilirubin Urine: NEGATIVE
Glucose, UA: NEGATIVE mg/dL
Hgb urine dipstick: NEGATIVE
Ketones, ur: 5 mg/dL — AB
Nitrite: NEGATIVE
Protein, ur: 100 mg/dL — AB
Specific Gravity, Urine: 1.025 (ref 1.005–1.030)
pH: 5 (ref 5.0–8.0)

## 2022-09-14 LAB — CBC WITH DIFFERENTIAL/PLATELET
Abs Immature Granulocytes: 0.1 10*3/uL — ABNORMAL HIGH (ref 0.00–0.07)
Basophils Absolute: 0.1 10*3/uL (ref 0.0–0.1)
Basophils Relative: 0 %
Eosinophils Absolute: 0.1 10*3/uL (ref 0.0–0.5)
Eosinophils Relative: 0 %
HCT: 42.3 % (ref 36.0–46.0)
Hemoglobin: 13.5 g/dL (ref 12.0–15.0)
Immature Granulocytes: 1 %
Lymphocytes Relative: 9 %
Lymphs Abs: 1.9 10*3/uL (ref 0.7–4.0)
MCH: 29.5 pg (ref 26.0–34.0)
MCHC: 31.9 g/dL (ref 30.0–36.0)
MCV: 92.4 fL (ref 80.0–100.0)
Monocytes Absolute: 1.9 10*3/uL — ABNORMAL HIGH (ref 0.1–1.0)
Monocytes Relative: 9 %
Neutro Abs: 16.9 10*3/uL — ABNORMAL HIGH (ref 1.7–7.7)
Neutrophils Relative %: 81 %
Platelets: 226 10*3/uL (ref 150–400)
RBC: 4.58 MIL/uL (ref 3.87–5.11)
RDW: 14.1 % (ref 11.5–15.5)
WBC: 20.9 10*3/uL — ABNORMAL HIGH (ref 4.0–10.5)
nRBC: 0 % (ref 0.0–0.2)

## 2022-09-14 LAB — COMPREHENSIVE METABOLIC PANEL
ALT: 26 U/L (ref 0–44)
AST: 31 U/L (ref 15–41)
Albumin: 3.6 g/dL (ref 3.5–5.0)
Alkaline Phosphatase: 45 U/L (ref 38–126)
Anion gap: 9 (ref 5–15)
BUN: 18 mg/dL (ref 8–23)
CO2: 30 mmol/L (ref 22–32)
Calcium: 9.6 mg/dL (ref 8.9–10.3)
Chloride: 101 mmol/L (ref 98–111)
Creatinine, Ser: 1.2 mg/dL — ABNORMAL HIGH (ref 0.44–1.00)
GFR, Estimated: 48 mL/min — ABNORMAL LOW (ref >60)
Glucose, Bld: 168 mg/dL — ABNORMAL HIGH (ref 70–99)
Potassium: 4.1 mmol/L (ref 3.5–5.1)
Sodium: 140 mmol/L (ref 135–145)
Total Bilirubin: 0.8 mg/dL (ref 0.3–1.2)
Total Protein: 7 g/dL (ref 6.5–8.1)

## 2022-09-14 LAB — LIPASE, BLOOD: Lipase: 40 U/L (ref 11–51)

## 2022-09-14 MED ORDER — LACTATED RINGERS IV BOLUS (SEPSIS)
1000.0000 mL | Freq: Once | INTRAVENOUS | Status: AC
  Administered 2022-09-14: 1000 mL via INTRAVENOUS

## 2022-09-14 NOTE — ED Provider Notes (Signed)
[facility] Provider Note    Event Date/Time   First MD Initiated Contact with Patient 09/14/22 2311     (approximate)   History   Abdominal Pain   HPI  Audrey Sexton is a 72 y.o. female with no history of cardiac problems but who has COPD on 3 L of oxygen at baseline.  No anticoagulation other than an 81 mg aspirin.  She presents for evaluation of some mild dull abdominal pain but with a large volume of bright red blood per rectum.  She said that this started earlier this afternoon.  She had to go to the bathroom and when she wiped she was surprised to find a large amount of bright red blood.  She said that it was dark blood in the toilet.  She has had multiple episodes today and this evening.  This is a new problem for her.  She has seen a GI doctor in the distant past but does not remember when and does not remember the date of her last colonoscopy.  She said that her episodes of bowel urgency and passage of blood were accompanied with some dull aching abdominal pain but she is not currently having any abdominal pain.  She denies nausea and vomiting.  She does not drink alcohol and never has.  She denies any acute shortness of breath and denies chest pain.  No recent fever.     Physical Exam   Triage Vital Signs: ED Triage Vitals  Enc Vitals Group     BP 09/14/22 2019 (!) 156/76     Pulse Rate 09/14/22 2019 (!) 104     Resp 09/14/22 2019 20     Temp 09/14/22 2019 99 F (37.2 C)     Temp src --      SpO2 09/14/22 2018 93 %     Weight 09/14/22 2019 88.9 kg (196 lb)     Height 09/14/22 2019 1.626 m (5\' 4" )     Head Circumference --      Peak Flow --      Pain Score 09/14/22 2019 0     Pain Loc --      Pain Edu? --      Excl. in GC? --     Most recent vital signs: Vitals:   09/14/22 2019 09/14/22 2022  BP: (!) 156/76   Pulse: (!) 104 (!) 118  Resp: 20   Temp: 99 F (37.2 C)   SpO2: 91% 93%    General: Awake, alert,  conversant. CV:  Good peripheral perfusion. Mild tachycardia at about 105.   Resp:  Normal effort. Breathing easily and comfortably, no accessory muscle usage, no retractions. Abd:  No distention. No tenderness to palpation.  No guarding nor rebound. Other:  Normal mood and affect.  No focal neuro deficits.   ED Results / Procedures / Treatments   Labs (all labs ordered are listed, but only abnormal results are displayed) Labs Reviewed  COMPREHENSIVE METABOLIC PANEL - Abnormal; Notable for the following components:      Result Value   Glucose, Bld 168 (*)    Creatinine, Ser 1.20 (*)    GFR, Estimated 48 (*)    All other components within normal limits  URINALYSIS, ROUTINE W REFLEX MICROSCOPIC - Abnormal; Notable for the following components:   Color, Urine AMBER (*)    APPearance CLOUDY (*)    Ketones, ur 5 (*)    Protein, ur 100 (*)    Leukocytes,Ua  TRACE (*)    All other components within normal limits  CBC WITH DIFFERENTIAL/PLATELET - Abnormal; Notable for the following components:   WBC 20.9 (*)    Neutro Abs 16.9 (*)    Monocytes Absolute 1.9 (*)    Abs Immature Granulocytes 0.10 (*)    All other components within normal limits  CULTURE, BLOOD (ROUTINE X 2)  CULTURE, BLOOD (ROUTINE X 2)  LIPASE, BLOOD  PROTIME-INR  APTT  LACTIC ACID, PLASMA  LACTIC ACID, PLASMA  TYPE AND SCREEN     EKG  ED ECG REPORT I, Hinda Kehr, the attending physician, personally viewed and interpreted this ECG.  Date: 09/15/2022 EKG Time: 2:04 AM Rate: 102 Rhythm: Sinus tachycardia QRS Axis: normal Intervals: normal ST/T Wave abnormalities: normal Narrative Interpretation: no evidence of acute ischemia    RADIOLOGY I viewed and interpreted the patient's CTA abdomen/pelvis (GI bleed protocol).  I do not see any obvious sign of active extravasation/bleeding and no obvious sign of acute infection.  Radiology report agrees.    PROCEDURES:  Critical Care performed: Yes, see  critical care procedure note(s)  .1-3 Lead EKG Interpretation  Performed by: Hinda Kehr, MD Authorized by: Hinda Kehr, MD     Interpretation: abnormal     ECG rate:  105   ECG rate assessment: tachycardic     Rhythm: sinus tachycardia     Ectopy: none     Conduction: normal   .Critical Care  Performed by: Hinda Kehr, MD Authorized by: Hinda Kehr, MD   Critical care provider statement:    Critical care time (minutes):  45   Critical care time was exclusive of:  Separately billable procedures and treating other patients   Critical care was necessary to treat or prevent imminent or life-threatening deterioration of the following conditions:  Sepsis   Critical care was time spent personally by me on the following activities:  Development of treatment plan with patient or surrogate, evaluation of patient's response to treatment, examination of patient, obtaining history from patient or surrogate, ordering and performing treatments and interventions, ordering and review of laboratory studies, ordering and review of radiographic studies, pulse oximetry, re-evaluation of patient's condition and review of old charts    MEDICATIONS ORDERED IN ED: Medications  lactated ringers bolus 1,000 mL (1,000 mLs Intravenous New Bag/Given 09/14/22 2352)     IMPRESSION / MDM / Carl / ED COURSE  I reviewed the triage vital signs and the nursing notes.                              Differential diagnosis includes, but is not limited to, diverticulosis, AV malformation, neoplasm, brisk upper GI bleed.  Patient's presentation is most consistent with acute presentation with potential threat to life or bodily function.  Lab/studies ordered:  CMP, CBC with differential, lactic acid, pro time-INR, APTT, type and screen, blood cultures x 2, urinalysis, CTA angio GI bleed protocol, cardiac monitoring.  Interventions ordered initially: LR 1 L IV bolus.  The patient is on the cardiac  monitor to evaluate for evidence of arrhythmia and/or significant heart rate changes.  No chaperone immediately available for rectal exam, but story is consistent with acute hematochezia/lower GI bleed.  Brisk upper GI bleed is possible.  Labs notable for a white blood cell count of 20.9.  She has no other infectious symptoms but she is also tachycardic.  She meets SIRS criteria and I ordered LR 1  L IV bolus and a lactic acid which has come back within normal limits.  CT scan is pending.  Patient is in no pain or distress at this time but may need admission given age and comorbidities and a brisk GI bleed with SIRS criteria and tachycardia.  Of note, her initial hemoglobin is reassuring at 13.5 although it may drop given the relatively acute blood loss.   Clinical Course as of 09/15/22 0145  Fri Sep 15, 2022  0023 Lactic Acid, Venous: 1.3 [CF]  0051 ABO/RH(D): O POS [CF]  0125 After liter of fluids, patient heart rate remains above 100.  She feels okay and is not having abdominal pain.  However she is tachycardic with a leukocytosis and acute GI bleeding.  She meets sepsis criteria and I activated code sepsis and ordered empiric antibiotics for intra-abdominal infection including cefepime 2 g IV and metronidazole 500 mg IV.  I ordered a second liter of LR IV bolus but I am not ordering 1/3 L because she does not meet criteria for severe sepsis.  Consulting hospitalist for admission.  I explained the plan to the patient and she agrees. [CF]  0144 Consulted by phone with Dr. Damita Dunnings with the hospitalist service who will admit the patient. [CF]    Clinical Course User Index [CF] Hinda Kehr, MD     FINAL CLINICAL IMPRESSION(S) / ED DIAGNOSES   Final diagnoses:  None     Rx / DC Orders   ED Discharge Orders     None        Note:  This document was prepared using Dragon voice recognition software and may include unintentional dictation errors.   Hinda Kehr, MD 09/15/22  [phone]

## 2022-09-14 NOTE — Patient Outreach (Signed)
  Care Coordination   09/14/2022 Name: Audrey Sexton MRN: [id number] DOB: 01-May-1951   Care Coordination Outreach Attempts:  A second unsuccessful outreach was attempted today to offer the patient with information about available care coordination services as a benefit of their health plan.     Follow Up Plan:  Additional outreach attempts will be made to offer the patient care coordination information and services.   Encounter Outcome:  No Answer   Care Coordination Interventions:  No, not indicated    Tomasa Rand, RN, BSN, [facility] [employer] [phone]

## 2022-09-14 NOTE — ED Notes (Signed)
ED Provider at bedside. 

## 2022-09-14 NOTE — ED Triage Notes (Signed)
Arrives EMS from home.   Went to restroom ~7pm tonight.   1 Episode of dark red stool followed with intermittent sharp RLQ pains.   Denies n/v/d. Denies anticoagulants.   Hx Copd 3L [place] chronic.

## 2022-09-15 ENCOUNTER — Inpatient Hospital Stay: Payer: 59 | Admitting: Anesthesiology

## 2022-09-15 ENCOUNTER — Encounter
Admission: EM | Disposition: A | Discharge status: Home / Self Care | Payer: Self-pay | Source: Home / Self Care | Attending: Emergency Medicine

## 2022-09-15 ENCOUNTER — Emergency Department: Payer: 59

## 2022-09-15 ENCOUNTER — Encounter: Payer: Self-pay | Admitting: Internal Medicine

## 2022-09-15 DIAGNOSIS — D62 Acute posthemorrhagic anemia: Secondary | ICD-10-CM | POA: Diagnosis not present

## 2022-09-15 DIAGNOSIS — Z87891 Personal history of nicotine dependence: Secondary | ICD-10-CM | POA: Diagnosis not present

## 2022-09-15 DIAGNOSIS — I5032 Chronic diastolic (congestive) heart failure: Secondary | ICD-10-CM

## 2022-09-15 DIAGNOSIS — K921 Melena: Secondary | ICD-10-CM | POA: Diagnosis present

## 2022-09-15 DIAGNOSIS — N200 Calculus of kidney: Secondary | ICD-10-CM | POA: Diagnosis not present

## 2022-09-15 DIAGNOSIS — I774 Celiac artery compression syndrome: Secondary | ICD-10-CM | POA: Diagnosis not present

## 2022-09-15 DIAGNOSIS — K625 Hemorrhage of anus and rectum: Secondary | ICD-10-CM | POA: Diagnosis not present

## 2022-09-15 DIAGNOSIS — K922 Gastrointestinal hemorrhage, unspecified: Secondary | ICD-10-CM

## 2022-09-15 DIAGNOSIS — I129 Hypertensive chronic kidney disease with stage 1 through stage 4 chronic kidney disease, or unspecified chronic kidney disease: Secondary | ICD-10-CM | POA: Diagnosis not present

## 2022-09-15 DIAGNOSIS — K7689 Other specified diseases of liver: Secondary | ICD-10-CM | POA: Diagnosis not present

## 2022-09-15 DIAGNOSIS — J9611 Chronic respiratory failure with hypoxia: Secondary | ICD-10-CM | POA: Diagnosis present

## 2022-09-15 DIAGNOSIS — J449 Chronic obstructive pulmonary disease, unspecified: Secondary | ICD-10-CM | POA: Diagnosis not present

## 2022-09-15 DIAGNOSIS — N183 Chronic kidney disease, stage 3 unspecified: Secondary | ICD-10-CM | POA: Diagnosis not present

## 2022-09-15 DIAGNOSIS — K2970 Gastritis, unspecified, without bleeding: Secondary | ICD-10-CM | POA: Diagnosis not present

## 2022-09-15 DIAGNOSIS — D72829 Elevated white blood cell count, unspecified: Secondary | ICD-10-CM | POA: Diagnosis present

## 2022-09-15 HISTORY — PX: ESOPHAGOGASTRODUODENOSCOPY (EGD) WITH PROPOFOL: SHX5813

## 2022-09-15 LAB — HEMOGLOBIN
Hemoglobin: 11.5 g/dL — ABNORMAL LOW (ref 12.0–15.0)
Hemoglobin: 11.3 g/dL — ABNORMAL LOW (ref 12.0–15.0)
Hemoglobin: 11.3 g/dL — ABNORMAL LOW (ref 12.0–15.0)

## 2022-09-15 LAB — PROTIME-INR
INR: 1.1 (ref 0.8–1.2)
Prothrombin Time: 13.9 seconds (ref 11.4–15.2)

## 2022-09-15 LAB — APTT: aPTT: 28 seconds (ref 24–36)

## 2022-09-15 LAB — TYPE AND SCREEN
ABO/RH(D): O POS
Antibody Screen: NEGATIVE

## 2022-09-15 LAB — LACTIC ACID, PLASMA: Lactic Acid, Venous: 1.3 mmol/L (ref 0.5–1.9)

## 2022-09-15 LAB — PROCALCITONIN: Procalcitonin: <0.1 ng/mL

## 2022-09-15 SURGERY — ESOPHAGOGASTRODUODENOSCOPY (EGD) WITH PROPOFOL
Anesthesia: General

## 2022-09-15 MED ORDER — METRONIDAZOLE 500 MG/100ML IV SOLN
500.0000 mg | Freq: Once | INTRAVENOUS | Status: AC
  Administered 2022-09-15: 500 mg via INTRAVENOUS
  Filled 2022-09-15: qty 100

## 2022-09-15 MED ORDER — LACTATED RINGERS IV BOLUS (SEPSIS)
1000.0000 mL | Freq: Once | INTRAVENOUS | Status: AC
  Administered 2022-09-15: 1000 mL via INTRAVENOUS

## 2022-09-15 MED ORDER — ONDANSETRON HCL 4 MG PO TABS: 4.0000 mg | ORAL_TABLET | Freq: Four times a day (QID) | ORAL | Status: DC | PRN

## 2022-09-15 MED ORDER — ALBUTEROL SULFATE (2.5 MG/3ML) 0.083% IN NEBU: 2.5000 mg | INHALATION_SOLUTION | RESPIRATORY_TRACT | Status: DC | PRN

## 2022-09-15 MED ORDER — PROPOFOL 500 MG/50ML IV EMUL
INTRAVENOUS | Status: DC | PRN
  Administered 2022-09-15: 150 ug/kg/min via INTRAVENOUS

## 2022-09-15 MED ORDER — HYDRALAZINE HCL 20 MG/ML IJ SOLN: 5.0000 mg | INTRAMUSCULAR | Status: DC | PRN

## 2022-09-15 MED ORDER — IPRATROPIUM-ALBUTEROL 0.5-2.5 (3) MG/3ML IN SOLN
RESPIRATORY_TRACT | Status: AC
  Filled 2022-09-15: qty 3

## 2022-09-15 MED ORDER — BISACODYL 5 MG PO TBEC
10.0000 mg | DELAYED_RELEASE_TABLET | Freq: Once | ORAL | Status: AC
  Administered 2022-09-15: 10 mg via ORAL
  Filled 2022-09-15: qty 2

## 2022-09-15 MED ORDER — IPRATROPIUM-ALBUTEROL 0.5-2.5 (3) MG/3ML IN SOLN
3.0000 mL | Freq: Once | RESPIRATORY_TRACT | Status: AC
  Administered 2022-09-15: 3 mL via RESPIRATORY_TRACT

## 2022-09-15 MED ORDER — ACETAMINOPHEN 325 MG PO TABS
650.0000 mg | ORAL_TABLET | Freq: Four times a day (QID) | ORAL | Status: DC | PRN
  Administered 2022-09-15 – 2022-09-16 (×2): 650 mg via ORAL
  Filled 2022-09-15 (×2): qty 2

## 2022-09-15 MED ORDER — PANTOPRAZOLE SODIUM 40 MG PO TBEC
40.0000 mg | DELAYED_RELEASE_TABLET | Freq: Every day | ORAL | Status: DC
  Administered 2022-09-15 – 2022-09-17 (×3): 40 mg via ORAL
  Filled 2022-09-15 (×3): qty 1

## 2022-09-15 MED ORDER — ATORVASTATIN CALCIUM 20 MG PO TABS
40.0000 mg | ORAL_TABLET | Freq: Every day | ORAL | Status: DC
  Administered 2022-09-15 – 2022-09-16 (×2): 40 mg via ORAL
  Filled 2022-09-15 (×2): qty 2

## 2022-09-15 MED ORDER — PEG 3350-KCL-NA BICARB-NACL 420 G PO SOLR
4000.0000 mL | Freq: Once | ORAL | Status: AC
  Administered 2022-09-15: 4000 mL via ORAL
  Filled 2022-09-15: qty 4000

## 2022-09-15 MED ORDER — SODIUM CHLORIDE 0.9 % IV SOLN: INTRAVENOUS | Status: DC

## 2022-09-15 MED ORDER — SODIUM CHLORIDE 0.9 % IV SOLN: INTRAVENOUS | Status: AC

## 2022-09-15 MED ORDER — ONDANSETRON HCL 4 MG/2ML IJ SOLN: 4.0000 mg | Freq: Four times a day (QID) | INTRAMUSCULAR | Status: DC | PRN

## 2022-09-15 MED ORDER — ACETAMINOPHEN 650 MG RE SUPP: 650.0000 mg | Freq: Four times a day (QID) | RECTAL | Status: DC | PRN

## 2022-09-15 MED ORDER — PROPOFOL 10 MG/ML IV BOLUS
INTRAVENOUS | Status: DC | PRN
  Administered 2022-09-15: 30 mg via INTRAVENOUS

## 2022-09-15 MED ORDER — FLUTICASONE FUROATE-VILANTEROL 100-25 MCG/ACT IN AEPB
1.0000 | INHALATION_SPRAY | Freq: Every day | RESPIRATORY_TRACT | Status: DC
  Administered 2022-09-17: 1 via RESPIRATORY_TRACT
  Filled 2022-09-15: qty 28

## 2022-09-15 MED ORDER — PEG 3350-KCL-NA BICARB-NACL 420 G PO SOLR: 2000.0000 mL | Freq: Once | ORAL | Status: DC | PRN

## 2022-09-15 MED ORDER — PROPOFOL 1000 MG/100ML IV EMUL
INTRAVENOUS | Status: AC
  Filled 2022-09-15: qty 100

## 2022-09-15 MED ORDER — IOHEXOL 350 MG/ML SOLN
100.0000 mL | Freq: Once | INTRAVENOUS | Status: AC | PRN
  Administered 2022-09-15: 100 mL via INTRAVENOUS

## 2022-09-15 MED ORDER — SODIUM CHLORIDE 0.9 % IV SOLN
2.0000 g | Freq: Once | INTRAVENOUS | Status: AC
  Administered 2022-09-15: 2 g via INTRAVENOUS
  Filled 2022-09-15: qty 12.5

## 2022-09-15 NOTE — Final Progress Note (Signed)
Same day rounding progress note  Patient seen and examined while in the ED.  I agree with assessment and plan outlined by Dr. Damita Dunnings.  See her dictated history and physical for further details.  GI bleed GI planning EGD today and possible colonoscopy tomorrow after colon Prep Continue IV Protonix Monitor H&H Hemoglobin dropped from 13.5-> 11.5  Time spent: 25 minutes

## 2022-09-15 NOTE — Anesthesia Procedure Notes (Signed)
Date/Time: 09/15/2022 1:01 PM  Performed by: Nelda Marseille, CRNAPre-anesthesia Checklist: Patient identified, Emergency Drugs available, Suction available, Patient being monitored and Timeout performed Oxygen Delivery Method: Nasal cannula

## 2022-09-15 NOTE — Assessment & Plan Note (Addendum)
SIRS Patient with leukocytosis, low-grade temp and tachycardia that could all be due to hematochezia Patient was treated empirically with cefepime and Flagyl in the ED Will not continue antibiotics for now No  infection

## 2022-09-15 NOTE — H&P (Addendum)
History and Physical    Patient: Audrey Sexton EXN:[id number] DOB: 11/19/1950 DOA: 09/14/2022 DOS: the patient was seen and examined on 09/15/2022 PCP: Ailene Ravel, MD  Patient coming from: Home  Chief Complaint:  Chief Complaint  Patient presents with   Abdominal Pain    HPI: Audrey Sexton is a 72 y.o. female with medical history significant for COPD on home O2 at 3 L, HTN, prior CVA, who presents to the ED with multiple episodes of rectal bleeding associated with abdominal cramping.  She denies vomiting, fever or chills.Patient had a colonoscopy in November 2022 that was nondiagnostic due to poor visualization as a result of a poor prep.  Patient denies prior episodes of rectal bleeding.  She was in her usual state of health previously. ED course and data review: BP on arrival 156/76.  Low-grade temperature of 99.  Persistent tachycardia of 104-118.  WBC 21,000, hemoglobin 13.5.CMP unremarkable, urine unremarkable.  Creatinine at baseline at 1.2.  CTA abdomen and pelvis showed no acute intra-abdominal or intrapelvic abnormality and no evidence of active GI bleed. Patient met sepsis criteria from the ED and was started on sepsis fluids as well as cefepime and metronidazole and hospitalist consulted for admission.   Review of Systems: As mentioned in the history of present illness. All other systems reviewed and are negative.  Past Medical History:  Diagnosis Date   Anemia    Asthma    Carpal tunnel syndrome of left wrist    COPD (chronic obstructive pulmonary disease) (HCC)    History of kidney stones    Hyperlipemia    Hypertension    Stroke [facility])    Jan. 2020   Past Surgical History:  Procedure Laterality Date   CARPAL TUNNEL RELEASE Left    COLONOSCOPY N/A 09/07/2020   Procedure: COLONOSCOPY;  Surgeon: Regis Bill, MD;  Location: [facility] ENDOSCOPY;  Service: Endoscopy;  Laterality: N/A;   fractured tibia Left    repair   ROTATOR CUFF REPAIR Left    TEE WITHOUT  CARDIOVERSION N/A 09/09/2018   Procedure: TRANSESOPHAGEAL ECHOCARDIOGRAM (TEE);  Surgeon: Dalia Heading, MD;  Location: [facility] ORS;  Service: Cardiovascular;  Laterality: N/A;   TUBAL LIGATION     Social History:  reports that she has quit smoking. Her smoking use included cigarettes. She has a 40.00 pack-year smoking history. She has never used smokeless tobacco. She reports that she does not drink alcohol and does not use drugs.  Allergies  Allergen Reactions   Percocet [Oxycodone-Acetaminophen] Nausea And Vomiting   Vicodin [Hydrocodone-Acetaminophen] Nausea And Vomiting   Penicillins Hives    Has patient had a PCN reaction causing immediate rash, facial/tongue/throat swelling, SOB or lightheadedness with hypotension: no Has patient had a PCN reaction causing severe rash involving mucus membranes or skin necrosis: no Has patient had a PCN reaction that required hospitalization  no Has patient had a PCN reaction occurring within the last 10 years: no If all of the above answers are "NO", then may proceed with Cephalosporin use.     Family History  Problem Relation Age of Onset   Heart disease Mother        died at 51   Diabetes Mother    Alzheimer's disease Father        died at 9   Parkinson's disease Sister    Heart disease Brother        died at 71   Diabetes Brother    Kidney disease Sister  Breast cancer Sister     Prior to Admission medications   Medication Sig Start Date End Date Taking? Authorizing Provider  albuterol (PROVENTIL HFA;VENTOLIN HFA) 108 (90 Base) MCG/ACT inhaler Inhale 2-4 puffs by mouth every 4 hours as needed for wheezing, cough, and/or shortness of breath 01/01/17   Hinda Kehr, MD  Ascorbic Acid (VITAMIN C) 1000 MG tablet Take 1,000 mg by mouth daily.    [provider]  aspirin EC 81 MG EC tablet Take 1 tablet (81 mg total) by mouth daily. 09/12/18   Henreitta Leber, MD  atorvastatin (LIPITOR) 40 MG tablet Take 40 mg by mouth at bedtime.      [provider]  Cholecalciferol 25 MCG (1000 UT) tablet Take 1,000 Units by mouth daily.    [provider]  diltiazem (CARDIZEM CD) 240 MG 24 hr capsule Take 240 mg by mouth daily.    [provider]  diltiazem (CARDIZEM CD) 300 MG 24 hr capsule Take 1 capsule (300 mg total) by mouth at bedtime. 02/08/20   Dhungel, Nishant, MD  fluticasone furoate-vilanterol (BREO ELLIPTA) 100-25 MCG/INH AEPB Inhale 1 puff into the lungs daily. 06/14/17   Hillary Bow, MD  furosemide (LASIX) 40 MG tablet Take 40 mg by mouth daily. Patient not taking: Reported on 09/07/2020    [provider]  guaiFENesin (MUCINEX) 600 MG 12 hr tablet Take 1 tablet (600 mg total) by mouth 2 (two) times daily. 02/08/20   Dhungel, Nishant, MD  ipratropium (ATROVENT) 0.02 % nebulizer solution Take 0.5 mg by nebulization 2 (two) times daily as needed for wheezing or shortness of breath.    [provider]  ipratropium-albuterol (DUONEB) 0.5-2.5 (3) MG/3ML SOLN Take 3 mLs by nebulization every 6 (six) hours as needed. 02/08/20   Dhungel, Flonnie Overman, MD  lisinopril-hydrochlorothiazide (ZESTORETIC) 20-12.5 MG tablet Take 1 tablet by mouth daily. 12/29/19   [provider]  meloxicam (MOBIC) 15 MG tablet Take 15 mg by mouth daily. Patient not taking: Reported on 09/07/2020    [provider]  Multiple Vitamin (MULTI-VITAMIN DAILY PO) Take 1 tablet by mouth daily.    [provider]  omeprazole (PRILOSEC) 20 MG capsule Take 20 mg by mouth daily.    [provider]  oxybutynin (DITROPAN XL) 15 MG 24 hr tablet Take 15 mg by mouth daily. 02/03/20   [provider]  oxybutynin (DITROPAN) 5 MG tablet Take 5 mg by mouth daily.    [provider]  PARoxetine (PAXIL) 20 MG tablet Take 20 mg by mouth daily.    [provider]  potassium chloride SA (K-DUR,KLOR-CON) 20 MEQ tablet Take 1 tablet (20 mEq total) by mouth daily. 06/15/17   Hillary Bow,  MD  tiotropium (SPIRIVA) 18 MCG inhalation capsule Place 18 mcg into inhaler and inhale daily.    [provider]  umeclidinium bromide (INCRUSE ELLIPTA) 62.5 MCG/INH AEPB Inhale 1 puff into the lungs daily.    [provider]  venlafaxine XR (EFFEXOR-XR) 150 MG 24 hr capsule Take 150 mg by mouth at bedtime.  07/28/18   [provider]  vitamin B-12 (CYANOCOBALAMIN) 1000 MCG tablet Take 1,000 mcg by mouth daily.     [provider]    Physical Exam: Vitals:   09/14/22 2019 09/14/22 2022 09/14/22 2359 09/15/22 0100  BP: (!) 156/76  (!) 165/86 (!) 168/90  Pulse: (!) 104 (!) 118 (!) 103 (!) 104  Resp: 20  20   Temp: 99 F (37.2 C)  98.4 F (36.9 C)   TempSrc:   Oral   SpO2: 91% 93% 96% 95%  Weight: 88.9 kg     Height: 5\' 4"  (1.626 m)      Physical Exam Vitals and nursing note reviewed.  Constitutional:      General: She is not in acute distress. HENT:     Head: Normocephalic and atraumatic.  Cardiovascular:     Rate and Rhythm: Regular rhythm. Tachycardia present.     Heart sounds: Normal heart sounds.  Pulmonary:     Effort: Pulmonary effort is normal.     Breath sounds: Normal breath sounds.  Abdominal:     Palpations: Abdomen is soft.     Tenderness: There is no abdominal tenderness.  Neurological:     Mental Status: Mental status is at baseline.     Labs on Admission: I have personally reviewed following labs and imaging studies  CBC: Recent Labs  Lab 09/14/22 2023  WBC 20.9*  NEUTROABS 16.9*  HGB 13.5  HCT 42.3  MCV 92.4  PLT 226   Basic Metabolic Panel: Recent Labs  Lab 09/14/22 2023  NA 140  K 4.1  CL 101  CO2 30  GLUCOSE 168*  BUN 18  CREATININE 1.20*  CALCIUM 9.6   GFR: Estimated Creatinine Clearance: 45.1 mL/min (A) (by C-G formula based on SCr of 1.2 mg/dL (H)). Liver Function Tests: Recent Labs  Lab 09/14/22 2023  AST 31  ALT 26  ALKPHOS 45  BILITOT 0.8  PROT 7.0  ALBUMIN 3.6   Recent Labs   Lab 09/14/22 2023  LIPASE 40   No results for input(s): "AMMONIA" in the last 168 hours. Coagulation Profile: Recent Labs  Lab 09/14/22 2341  INR 1.1   Cardiac Enzymes: No results for input(s): "CKTOTAL", "CKMB", "CKMBINDEX", "TROPONINI" in the last 168 hours. BNP (last 3 results) No results for input(s): "PROBNP" in the last 8760 hours. HbA1C: No results for input(s): "HGBA1C" in the last 72 hours. CBG: No results for input(s): "GLUCAP" in the last 168 hours. Lipid Profile: No results for input(s): "CHOL", "HDL", "LDLCALC", "TRIG", "CHOLHDL", "LDLDIRECT" in the last 72 hours. Thyroid Function Tests: No results for input(s): "TSH", "T4TOTAL", "FREET4", "T3FREE", "THYROIDAB" in the last 72 hours. Anemia Panel: No results for input(s): "VITAMINB12", "FOLATE", "FERRITIN", "TIBC", "IRON", "RETICCTPCT" in the last 72 hours. Urine analysis:    Component Value Date/Time   COLORURINE AMBER (A) 09/14/2022 2023   APPEARANCEUR CLOUDY (A) 09/14/2022 2023   APPEARANCEUR Clear 06/16/2014 2108   LABSPEC 1.025 09/14/2022 2023   LABSPEC 1.003 06/16/2014 2108   PHURINE 5.0 09/14/2022 2023   GLUCOSEU NEGATIVE 09/14/2022 2023   GLUCOSEU Negative 06/16/2014 2108   HGBUR NEGATIVE 09/14/2022 2023   BILIRUBINUR NEGATIVE 09/14/2022 2023   BILIRUBINUR Negative 06/16/2014 2108   KETONESUR 5 (A) 09/14/2022 2023   PROTEINUR 100 (A) 09/14/2022 2023   NITRITE NEGATIVE 09/14/2022 2023   LEUKOCYTESUR TRACE (A) 09/14/2022 2023   LEUKOCYTESUR Negative 06/16/2014 2108    Radiological Exams on Admission: CT ANGIO GI BLEED  Result Date: 09/15/2022 CLINICAL DATA:  bright red blood per rectum, abdominal pain. Right lower quadrant pain EXAM: CTA ABDOMEN AND PELVIS WITHOUT AND WITH CONTRAST TECHNIQUE: Multidetector CT imaging of the abdomen and pelvis was performed using the standard protocol during bolus administration of intravenous contrast. Multiplanar reconstructed images and MIPs were obtained and  reviewed to evaluate the vascular anatomy. RADIATION DOSE REDUCTION: This exam was performed according to the departmental dose-optimization program which includes  automated exposure control, adjustment of the mA and/or kV according to patient size and/or use of iterative reconstruction technique. CONTRAST:  175mL OMNIPAQUE IOHEXOL 350 MG/ML SOLN COMPARISON:  CT angiography chest 08/02/2018, CT renal 05/27/2007 FINDINGS: VASCULAR No extravasation of intravenous contrast within the lumen of the bowel. Aorta: Moderate calcified and noncalcified atherosclerotic plaque. Normal caliber aorta without aneurysm, dissection, vasculitis or significant stenosis. Celiac: Mild atherosclerotic plaque. At least mild to moderate narrowing of the origin of the celiac artery with opacification distally. Patent without evidence of aneurysm, dissection, vasculitis or significant stenosis. SMA: Mild atherosclerotic plaque. Patent without evidence of aneurysm, dissection, vasculitis or significant stenosis. Renals: Mild atherosclerotic plaque. Both renal arteries are patent without evidence of aneurysm, dissection, vasculitis, fibromuscular dysplasia or significant stenosis. IMA: Patent without evidence of aneurysm, dissection, vasculitis or significant stenosis. Inflow: Mild atherosclerotic plaque. Patent without evidence of aneurysm, dissection, vasculitis or significant stenosis. Proximal Outflow: Bilateral common femoral and visualized portions of the superficial and profunda femoral arteries are patent without evidence of aneurysm, dissection, vasculitis or significant stenosis. Veins: The main portal, splenic, superior mesenteric veins are patent. Review of the MIP images confirms the above findings. NON-VASCULAR Lower chest: Slightly more conspicuous chronic 7 x 6 mm left lower lobe pulmonary nodule-no further follow-up indicated. No acute abnormality. Hepatobiliary: Nonenhancing subcentimeter hypodensities small to characterize.  Nonenhancing simple hepatic cyst measuring up to 2.4 cm. No gallstones, gallbladder wall thickening, or pericholecystic fluid. No biliary dilatation. Pancreas: Diffusely atrophic. No focal lesion. Otherwise normal pancreatic contour. No surrounding inflammatory changes. No main pancreatic ductal dilatation. Spleen: Normal in size. Vague nonspecific hypodensity. Splenule noted. Adrenals/Urinary Tract: No adrenal nodule bilaterally. Bilateral kidneys enhance symmetrically. Lobulated simple fluid density lesions within the right kidney likely represent simple renal cysts. The inferior-most cyst demonstrates thin septation/calcification-likely minimally complex. Minimally complex and simple renal cysts, in the absence of clinically indicated signs/symptoms, require no independent follow-up. No hydronephrosis. No hydroureter.  4 mm right nephrolithiasis. The urinary bladder is unremarkable. Stomach/Bowel: Stomach is within normal limits. No evidence of bowel wall thickening or dilatation. Third portion of the duodenum diverticula. No pneumatosis. Appendix appears normal. Lymphatic: No lymphadenopathy. Reproductive: Uterus and bilateral adnexa are unremarkable. Other: No intraperitoneal free fluid. No intraperitoneal free gas. No organized fluid collection. Musculoskeletal: No abdominal wall hernia or abnormality. No suspicious lytic or blastic osseous lesions. No acute displaced fracture. Multilevel degenerative changes of the spine with intervertebral disc space vacuum phenomenon and posterior disc osteophyte complex formation. Associated osseous neural foraminal stenosis. IMPRESSION: VASCULAR 1. No CT evidence of active gastrointestinal hemorrhage. 2. No acute abdominal aorta abnormality. 3.  Aortic Atherosclerosis (ICD10-I70.0). NON-VASCULAR 1. No acute intra-abdominal or intrapelvic abnormality. Electronically Signed   By: Iven Finn M.D.   On: 09/15/2022 00:54     Data Reviewed: Relevant notes from primary  care and specialist visits, past discharge summaries as available in EHR, including Care Everywhere. Prior diagnostic testing as pertinent to current admission diagnoses Updated medications and problem lists for reconciliation ED course, including vitals, labs, imaging, treatment and response to treatment Triage notes, nursing and pharmacy notes and ED provider's notes Notable results as noted in HPI   Assessment and Plan: * Hematochezia CTA abdomen and pelvis negative for acute GI bleed Serial H&H and transfuse if necessary GI consult We will keep n.p.o. in case of possible procedure   Leukocytosis SIRS Patient with leukocytosis, low-grade temp and tachycardia that could all be due to hematochezia Patient was treated empirically with cefepime and Flagyl  in the ED Will not continue antibiotics for now Get lactic acid and procalcitonin to evaluate for sepsis Continue IV hydration.  Chronic respiratory failure with hypoxia (HCC) Patient at baseline requirement Continue supplemental O2  Chronic diastolic CHF (congestive heart failure) (HCC) Clinically euvolemic Will hold Lasix in view of bleeding  HTN (hypertension) Hydralazine IV as needed while n.p.o.  COPD (chronic obstructive pulmonary disease) (HCC) Not acutely exacerbated Continue home inhalers DuoNebs as needed        DVT prophylaxis: SCDs Consults: GI Dr. Virgina Jock  Advance Care Planning:   Code Status: Prior   Family Communication: none  Disposition Plan: Back to previous home environment  Severity of Illness: The appropriate patient status for this patient is INPATIENT. Inpatient status is judged to be reasonable and necessary in order to provide the required intensity of service to ensure the patient's safety. The patient's presenting symptoms, physical exam findings, and initial radiographic and laboratory data in the context of their chronic comorbidities is felt to place them at high risk for further  clinical deterioration. Furthermore, it is not anticipated that the patient will be medically stable for discharge from the hospital within 2 midnights of admission.   * I certify that at the point of admission it is my clinical judgment that the patient will require inpatient hospital care spanning beyond 2 midnights from the point of admission due to high intensity of service, high risk for further deterioration and high frequency of surveillance required.*  Author: Athena Masse, MD 09/15/2022 2:02 AM  For on call review [email or web address].

## 2022-09-15 NOTE — Consult Note (Addendum)
GI Inpatient Consult Note  Reason for Consult: Hematochezia    Attending Requesting Consult: Dr. Damita Dunnings  History of Present Illness: Audrey Sexton is a 72 y.o. female seen for evaluation of hematochezia at the request of Dr. Damita Dunnings. Patient has a PMH of COPD home O2 3 L, hypertension, CVA disease (08/2018),hyperlipidemia, anemia, nephrolithiasis, asthma.  Patient presented to the [facility] ED  for chief complaint of abdominal pain  with large-volume bright red blood per rectum yesterday. Upon presentation to the ED, vital signs were BP156/76 pulse 104-118,  respirations 20, temp 99. SpO2 93%. Labs were significant for Hgb13.5 and today 11.5.  WBC 20.9, UA markable creatinine at baseline 1.2.  Imaging studies CTA/pelvis revealed no acute intra-abdominal or intrapelvic abnormality and no evidence of active GI bleed.  EKG sinus tachycardia no evidence of acute ischemia.   In ER, she met sepsis criteria and received has received 1 L LR, 1 dose of cefepime 2 g IV x1 metronidazole. No abdominal pain, hematochezia or melena.VSS.    Pt reports that she has normal formed stools on a regular basis.  Occasional constipation requiring MiraLAX.  Last colonoscopy attempt was 2022 for personal history of colon polyps and spite following the prep instructions, she had poor prep and study was aborted.  Patient had her last formed stool two days ago. Yesterday, she sat on the toilet to urinate, and when she wiped she saw fresh red blood on the toilet tissue.  She did not pass a bowel movement.  She got up, few minutes later she felt a sharp cramp in her right lower quadrant that was fleeting. She sat on the toilet and passed black liquid- no stool.  She had no further abdominal pain.  No chest pain ,shortness of breath, dizziness, or lightheadedness.  She has had normal diet, appetite, no and unintended weight loss. She has had no further hematochezia or melena.  No further abdominal pain and she feels well.  She does  have a personal history of colon polyps her most recent complete colonoscopy was over 5 years ago.  She presents on Prilosec and reports she has had heartburn well-controlled for many years.  No nausea, vomiting, dysphagia, epigastric pain, NSAID use or high platelets or anticoagulation.  No prior history of EGD or UGI bleed.    Last Colonoscopy: 09/07/2020: Indication: PH colon polyps: aborted Last Endoscopy: none   Past Medical History:  Past Medical History:  Diagnosis Date   Anemia    Asthma    Carpal tunnel syndrome of left wrist    COPD (chronic obstructive pulmonary disease) ([place])    History of kidney stones    Hyperlipemia    Hypertension    Stroke [facility])    Jan. 2020    Problem List: Patient Active Problem List   Diagnosis Date Noted   Hematochezia 09/15/2022   Chronic respiratory failure with hypoxia ([place]) 09/15/2022   Leukocytosis 09/15/2022   SIRS (systemic inflammatory response syndrome) ([place]) 09/15/2022   Acute on chronic respiratory failure with hypoxia and hypercapnia ([place]) 02/08/2020   Hypokalemia 02/08/2020   COPD with acute exacerbation ([place]) 02/08/2020   Chronic diastolic CHF (congestive heart failure) ([place]) 02/08/2020   Hypomagnesemia 02/08/2020   Sinus tachycardia 02/08/2020   Right leg weakness 09/06/2018   HTN (hypertension) 09/06/2018   HLD (hyperlipidemia) 09/06/2018   Stroke ([place]) 09/06/2018   Acute respiratory distress    DNR (do not resuscitate) discussion 08/23/2016   Palliative care by specialist 08/23/2016  Dyspnea 08/23/2016   COPD (chronic obstructive pulmonary disease) ([facility]) 08/18/2016    Past Surgical History: Past Surgical History:  Procedure Laterality Date   CARPAL TUNNEL RELEASE Left    COLONOSCOPY N/A 09/07/2020   Procedure: COLONOSCOPY;  Surgeon: Regis Bill, MD;  Location: [facility] ENDOSCOPY;  Service: Endoscopy;  Laterality: N/A;   fractured tibia Left    repair   ROTATOR CUFF REPAIR Left    TEE WITHOUT CARDIOVERSION  N/A 09/09/2018   Procedure: TRANSESOPHAGEAL ECHOCARDIOGRAM (TEE);  Surgeon: Dalia Heading, MD;  Location: [facility] ORS;  Service: Cardiovascular;  Laterality: N/A;   TUBAL LIGATION      Allergies: Allergies  Allergen Reactions   Percocet [Oxycodone-Acetaminophen] Nausea And Vomiting   Vicodin [Hydrocodone-Acetaminophen] Nausea And Vomiting   Penicillins Hives    Has patient had a PCN reaction causing immediate rash, facial/tongue/throat swelling, SOB or lightheadedness with hypotension: no Has patient had a PCN reaction causing severe rash involving mucus membranes or skin necrosis: no Has patient had a PCN reaction that required hospitalization  no Has patient had a PCN reaction occurring within the last 10 years: no If all of the above answers are "NO", then may proceed with Cephalosporin use.      Scheduled Inpatient Medications:    atorvastatin  40 mg Oral QHS   fluticasone furoate-vilanterol  1 puff Inhalation Daily    Continuous Inpatient Infusions:    sodium chloride 100 mL/hr at 09/15/22 0241    PRN Inpatient Medications:  acetaminophen **OR** acetaminophen, albuterol, hydrALAZINE, ondansetron **OR** ondansetron (ZOFRAN) IV  Family History: family history includes Alzheimer's disease in her father; Breast cancer in her sister; Diabetes in her brother and mother; Heart disease in her brother and mother; Kidney disease in her sister; Parkinson's disease in her sister.    Social History:   reports that she has quit smoking. Her smoking use included cigarettes. She has a 40.00 pack-year smoking history. She has never used smokeless tobacco. She reports that she does not drink alcohol and does not use drugs. The patient denies ETOH, tobacco, or drug use.   Review of Systems: Constitutional: Weight is stable.  Eyes: No changes in vision. ENT: No oral lesions, sore throat.Very poor dentition-no dental care  GI: see HPI.  Heme/Lymph: No easy bruising.  CV: No chest pain.   GU: No hematuria.  Integumentary: No rashes.  Neuro: No headaches.  Psych: No depression/anxiety.  Endocrine: No heat/cold intolerance.  Allergic/Immunologic: No urticaria.  Resp: Stable COPD on supplemental oxygen 3L/[place],  Musculoskeletal: No joint swelling.    Physical Examination: BP (!) 148/91 (BP Location: Left Arm)   Pulse (!) 102   Temp 98.4 F (36.9 C) (Oral)   Resp 15   Ht 5\' 4"  (1.626 m)   Wt 88.9 kg   SpO2 94%   BMI 33.64 kg/m  Gen: NAD, alert and oriented x 4. Appears relaxed and comfortable  HEENT:  EOMI, Neck: supple, no JVD or thyromegaly Chest: CTA bilaterally, no wheezes, crackles, or other adventitious sounds CV: RRR, no m/g/c/r Abd: soft, NT, ND, +BS in all four quadrants; no HSM, guarding, rigidity, or rebound tenderness, Reducible umbilical hernia - non-tender Ext: no edema Skin: no rash or lesions noted Lymph: no LAD  Data: Lab Results  Component Value Date   WBC 20.9 (H) 09/14/2022   HGB 11.5 (L) 09/15/2022   HCT 42.3 09/14/2022   MCV 92.4 09/14/2022   PLT 226 09/14/2022   Recent Labs  Lab 09/14/22 2023 09/15/22 0650  HGB 13.5 11.5*   Lab Results  Component Value Date   NA 140 09/14/2022   K 4.1 09/14/2022   CL 101 09/14/2022   CO2 30 09/14/2022   BUN 18 09/14/2022   CREATININE 1.20 (H) 09/14/2022   Lab Results  Component Value Date   ALT 26 09/14/2022   AST 31 09/14/2022   ALKPHOS 45 09/14/2022   BILITOT 0.8 09/14/2022   Recent Labs  Lab 09/14/22 2341  APTT 28  INR 1.1   Assessment/Plan: Audrey Sexton is a 72 y.o. 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  Patient presented to the [facility] ED  for chief complaint of abdominal pain with bright red blood on tissue and then black stool in the commode associated with fleeting sharp RLQ pain. DDX:  UGI bleed,  AV malformation, ischemic colon, neoplasm.  # Hematochezia # Anemia- acute 2/2 acute GIB # PH  colon polyps - No further bleeding or abdominal pain and is non tender on abd exam today.  - Hgb has drifted 2 units overnight.  - CTA showed no active bleeding. -  GERD on Prilosec 20 mg  daily and UGI complaints.   No  UGI c/o and takes chronic PPI . No NSAID use.  PLAN:  - EGD today- - NPO - she has not eaten since yest.  - She is not on anticoagulation or antiplatelet therapy - Consider csy tomorrow as inpatient.  -Labs in am- bmp, cbc, Protonix 40 mg iv q12 h Hold dvt ppx Monitor H&H.  Transfusion and resuscitation as per primary team Avoid frequent lab draws to prevent lab induced anemia Supportive care and antiemetics as per primary team Maintain two sites IV access Avoid nsaids Monitor for GIB.  # Leukocytosis with WBC 12.9.   - No other infectious symptoms.  With tachycardia she meets SIRS criteria received 1 L lactated Ringer's and lactic acid which returned normal.  Received cefepime and metronidazole in the ED - CT without signs of diverticulitis  # COPD with hypoxia on supplemental oxygen at home -No acute exacerbation.   Thank you for the consult. Please call with questions or concerns.  Denice Paradise, [facility] [phone]

## 2022-09-15 NOTE — Assessment & Plan Note (Addendum)
Patient at baseline 3 L oxygen requirement Continue supplemental O2 at 3 L which is her baseline

## 2022-09-15 NOTE — ED Notes (Signed)
ED Provider at bedside. Rectal exam preformed and positive for blood.

## 2022-09-15 NOTE — Transfer of Care (Signed)
Immediate Anesthesia Transfer of Care Note  Patient: Audrey Sexton  Procedure(s) Performed: ESOPHAGOGASTRODUODENOSCOPY (EGD) WITH PROPOFOL  Patient Location: PACU  Anesthesia Type:MAC  Level of Consciousness: sedated  Airway & Oxygen Therapy: Patient Spontanous Breathing and Patient connected to face mask oxygen  Post-op Assessment: Report given to RN and Post -op Vital signs reviewed and stable  Post vital signs: Reviewed and stable  Last Vitals:  Vitals Value Taken Time  BP 153/78 09/15/22 1314  Temp 36.1 C 09/15/22 1314  Pulse 102 09/15/22 1316  Resp 21 09/15/22 1316  SpO2 94 % 09/15/22 1316  Vitals shown include unvalidated device data.  Last Pain:  Vitals:   09/15/22 1314  TempSrc: Oral  PainSc:          Complications: No notable events documented.

## 2022-09-15 NOTE — Sepsis Progress Note (Signed)
Elink monitoring for the code sepsis protocol.  

## 2022-09-15 NOTE — Anesthesia Preprocedure Evaluation (Addendum)
Anesthesia Evaluation  Patient identified by MRN, date of birth, ID band Patient awake    Reviewed: Allergy & Precautions, NPO status , Patient's Chart, lab work & pertinent test results  History of Anesthesia Complications Negative for: history of anesthetic complications  Airway Mallampati: III   Neck ROM: Full    Dental  (+) Poor Dentition   Pulmonary asthma , COPD (on 3L home O2), former smoker   Pulmonary exam normal breath sounds clear to auscultation       Cardiovascular hypertension, Normal cardiovascular exam Rhythm:Regular Rate:Normal  ECG 09/15/22: Sinus tachycardia (HR 101)   Neuro/Psych 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)    GI/Hepatic ,GERD  ,,  Endo/Other  Obesity   Renal/GU Renal disease (stage III CKD)     Musculoskeletal   Abdominal   Peds  Hematology  (+) Blood dyscrasia, anemia   Anesthesia Other Findings   Reproductive/Obstetrics                             Anesthesia Physical Anesthesia Plan  ASA: 3  Anesthesia Plan: General   Post-op Pain Management:    Induction: Intravenous  PONV Risk Score and Plan: 3 and Propofol infusion, TIVA and Treatment may vary due to age or medical condition  Airway Management Planned: Natural Airway  Additional Equipment:   Intra-op Plan:   Post-operative Plan:   Informed Consent: I have reviewed the patients History and Physical, chart, labs and discussed the procedure including the risks, benefits and alternatives for the proposed anesthesia with the patient or authorized representative who has indicated his/her understanding and acceptance.       Plan Discussed with: CRNA  Anesthesia Plan Comments: (LMA/GETA backup discussed.  Patient consented for risks of anesthesia including but not limited to:  - adverse reactions to medications - damage to eyes, teeth, lips or other oral mucosa - nerve damage due to  positioning  - sore throat or hoarseness - damage to heart, brain, nerves, lungs, other parts of body or loss of life  Informed patient about role of CRNA in peri- and intra-operative care.  Patient voiced understanding.)        Anesthesia Quick Evaluation

## 2022-09-15 NOTE — Op Note (Signed)
[facility] Patient Name: Audrey Sexton Procedure Date: 09/15/2022 12:41 PM MRN: [id number] Account #: [id number] Date of Birth: [birth year] Admit Type: Inpatient Age: 72 Room: [facility] ENDO ROOM 1 Gender: Female Note Status: Finalized Instrument Name: Upper Endoscope [id number] Procedure:             Upper GI endoscopy Indications:           Acute post hemorrhagic anemia, Melena Providers:             Rueben Bash, DO Referring MD:          Lorenda Hatchet Medicines:             Monitored Anesthesia Care Complications:         No immediate complications. Estimated blood loss: None. Procedure:             Pre-Anesthesia Assessment:                        - Prior to the procedure, a History and Physical was                         performed, and patient medications and allergies were                         reviewed. The patient is competent. The risks and                         benefits of the procedure and the sedation options and                         risks were discussed with the patient. All questions                         were answered and informed consent was obtained.                         Patient identification and proposed procedure were                         verified by the physician, the nurse, the anesthetist                         and the technician in the endoscopy suite. Mental                         Status Examination: alert and oriented. Airway                         Examination: normal oropharyngeal airway and neck                         mobility. Respiratory Examination: poor air movement.                         CV Examination: RRR, no murmurs, no S3 or S4.                         Prophylactic Antibiotics: The patient does not require  prophylactic antibiotics. Prior Anticoagulants: The                         patient has taken no anticoagulant or antiplatelet                          agents. ASA Grade Assessment: III - A patient with                         severe systemic disease. After reviewing the risks and                         benefits, the patient was deemed in satisfactory                         condition to undergo the procedure. The anesthesia                         plan was to use monitored anesthesia care (MAC).                         Immediately prior to administration of medications,                         the patient was re-assessed for adequacy to receive                         sedatives. The heart rate, respiratory rate, oxygen                         saturations, blood pressure, adequacy of pulmonary                         ventilation, and response to care were monitored                         throughout the procedure. The physical status of the                         patient was re-assessed after the procedure.                        After obtaining informed consent, the endoscope was                         passed under direct vision. Throughout the procedure,                         the patient's blood pressure, pulse, and oxygen                         saturations were monitored continuously. The Endoscope                         was introduced through the mouth, and advanced to the                         second part of duodenum. The upper GI endoscopy was  accomplished without difficulty. The patient tolerated                         the procedure well. Findings:      The duodenal bulb, first portion of the duodenum and second portion of       the duodenum were normal. Estimated blood loss: none.      Scattered moderate inflammation characterized by erosions and erythema       was found in the entire examined stomach. No blood- fresh or old-       appreciated within the gastric lumen Erosiosn appreciated along the       greater curvature Estimated blood loss: none.      The Z-line was regular. Estimated blood  loss: none.      Esophagogastric landmarks were identified: the gastroesophageal junction       was found at 36 cm from the incisors.      The exam of the esophagus was otherwise normal. Impression:            - Normal duodenal bulb, first portion of the duodenum                         and second portion of the duodenum.                        - Gastritis.                        - Z-line regular.                        - Esophagogastric landmarks identified.                        - No specimens collected. Recommendation:        - Return patient to hospital ward for ongoing care.                        - Clear liquid diet.                        - NPO midnight.                        - Continue present medications.                        - collect h pylori stool testing                        - The findings and recommendations were discussed with                         the patient.                        - The findings and recommendations were discussed with                         the referring physician.                        - Use Protonix (pantoprazole)  40 mg PO daily                         [duration].                        - plan for colonoscopy tomorrow. go lytely prep today Procedure Code(s):     --- Professional ---                        [phone], Esophagogastroduodenoscopy, flexible,                         transoral; diagnostic, including collection of                         specimen(s) by brushing or washing, when performed                         (separate procedure) Diagnosis Code(s):     --- Professional ---                        K29.70, Gastritis, unspecified, without bleeding                        D62, Acute posthemorrhagic anemia                        K92.1, Melena (includes Hematochezia) CPT copyright 2022 American Medical Association. All rights reserved. The codes documented in this report are preliminary and upon coder review may  be revised to meet  current compliance requirements. Attending Participation:      I personally performed the entire procedure. Volney American, DO Annamaria Helling DO, DO 09/15/2022 1:18:39 PM This report has been signed electronically. Number of Addenda: 0 Note Initiated On: 09/15/2022 12:41 PM Estimated Blood Loss:  Estimated blood loss: none.      [facility]

## 2022-09-15 NOTE — Progress Notes (Signed)
PHARMACY -  BRIEF ANTIBIOTIC NOTE   Pharmacy has received consult(s) for Cefepime from an ED provider.  The patient's profile has been reviewed for ht/wt/allergies/indication/available labs.    One time order(s) placed for Cefepime 2 gm IV X 1  Further antibiotics/pharmacy consults should be ordered by admitting physician if indicated.                       Thank you, Edie Vallandingham D 09/15/2022  1:48 AM

## 2022-09-15 NOTE — Assessment & Plan Note (Addendum)
Clinically euvolemic 

## 2022-09-15 NOTE — Assessment & Plan Note (Addendum)
CTA abdomen and pelvis negative for acute GI bleed EGD did not show any acute bleeder.  Colonoscopy shows findings consistent with ischemic colitis but healing.  No bleeding stigmata.  Avoid hypotension

## 2022-09-15 NOTE — Assessment & Plan Note (Addendum)
Avoid hypotension due to concern for ischemic colitis seen at colonoscopy today

## 2022-09-15 NOTE — Interval H&P Note (Signed)
History and Physical Interval Note: Preprocedure H&P from 09/15/22  was reviewed and there was no interval change after seeing and examining the patient.  Written consent was obtained from the patient after discussion of risks, benefits, and alternatives. Patient has consented to proceed with Esophagogastroduodenoscopy with possible intervention  See original consult note from Dawson Bills, NP on 09/15/22   09/15/2022 12:58 PM  Audrey Sexton  has presented today for surgery, with the diagnosis of melena, anemia, hematochezia.  The various methods of treatment have been discussed with the patient and family. After consideration of risks, benefits and other options for treatment, the patient has consented to  Procedure(s): ESOPHAGOGASTRODUODENOSCOPY (EGD) WITH PROPOFOL (N/A) as a surgical intervention.  The patient's history has been reviewed, patient examined, no change in status, stable for surgery.  I have reviewed the patient's chart and labs.  Questions were answered to the patient's satisfaction.     Annamaria Helling

## 2022-09-15 NOTE — Consult Note (Signed)
GI Consult Note: Unable to attach to original consult note Attending Physician Attestation:  I have obtained an interval history from the patient, reviewed the chart and performed a physical examination. The Advanced Practitioner's note, clinical impression and recommendations have been reviewed and I agree with the assessment and plan as outlined above with the following further recommendations:  Story is suspicious for colonic ischemia given pain followed by brbpr and pt with atherosclerosis on imaging and tobacco history. Although pt now saying pain occurred after bowel movement. Will plan for colonoscopy tomorrow with prep today. In the interim, will rule out upper gi source of blood loss today with EGD. See op report for further details Will order cbc bmp for am 09/16/22 Go lytely prep today after egd. Clear liquids until midnight. OK To drink go lytely prep after midnight in prep for procedure.  Labs and imaging reviewed Protonix 40 mg iv q12 h for now Hold dvt ppx Monitor H&H.  Transfusion and resuscitation as per primary team Avoid frequent lab draws to prevent lab induced anemia Supportive care and antiemetics as per primary team Maintain two sites IV access Avoid nsaids Monitor for GIB.  Esophagogastroduodenoscopy and Colonoscopy with possible biopsy, control of bleeding, polypectomy, and interventions as necessary has been discussed with the patient/patient representative. Informed consent was obtained from the patient/patient representative after explaining the indication, nature, and risks of the procedure including but not limited to death, bleeding, perforation, missed neoplasm/lesions, cardiorespiratory compromise, and reaction to medications. Opportunity for questions was given and appropriate answers were provided. Patient/patient representative has verbalized understanding is amenable to undergoing the procedure.  Thank you for allowing [place] to be involved in this patient's  care.  Please call me or reach out via [place] chat if you have any questions.   Ronne Binning, DO [facility]  Office: [phone]

## 2022-09-15 NOTE — H&P (View-Only) (Signed)
GI Consult Note: Unable to attach to original consult note Attending Physician Attestation:  I have obtained an interval history from the patient, reviewed the chart and performed a physical examination. The Advanced Practitioner's note, clinical impression and recommendations have been reviewed and I agree with the assessment and plan as outlined above with the following further recommendations:  Story is suspicious for colonic ischemia given pain followed by brbpr and pt with atherosclerosis on imaging and tobacco history. Although pt now saying pain occurred after bowel movement. Will plan for colonoscopy tomorrow with prep today. In the interim, will rule out upper gi source of blood loss today with EGD. See op report for further details Will order cbc bmp for am 09/16/22 Go lytely prep today after egd. Clear liquids until midnight. OK To drink go lytely prep after midnight in prep for procedure.  Labs and imaging reviewed Protonix 40 mg iv q12 h for now Hold dvt ppx Monitor H&H.  Transfusion and resuscitation as per primary team Avoid frequent lab draws to prevent lab induced anemia Supportive care and antiemetics as per primary team Maintain two sites IV access Avoid nsaids Monitor for GIB.  Esophagogastroduodenoscopy and Colonoscopy with possible biopsy, control of bleeding, polypectomy, and interventions as necessary has been discussed with the patient/patient representative. Informed consent was obtained from the patient/patient representative after explaining the indication, nature, and risks of the procedure including but not limited to death, bleeding, perforation, missed neoplasm/lesions, cardiorespiratory compromise, and reaction to medications. Opportunity for questions was given and appropriate answers were provided. Patient/patient representative has verbalized understanding is amenable to undergoing the procedure.  Thank you for allowing us to be involved in this patient's  care.  Please call me or reach out via Epic Secure chat if you have any questions.   Aasha Dina M. Torrie Lafavor, DO [facility]  Office: [phone]     

## 2022-09-15 NOTE — ED Notes (Signed)
Patient off the floor to ENDO

## 2022-09-15 NOTE — Progress Notes (Signed)
CODE SEPSIS - PHARMACY COMMUNICATION  **Broad Spectrum Antibiotics should be administered within 1 hour of Sepsis diagnosis**  Time Code Sepsis Called/Page Received:  1/19 @ 0124  Antibiotics Ordered: Cefepime   Time of 1st antibiotic administration: Cefepime 2 gm IV X 1 on 1/19 @ 0134  Additional action taken by pharmacy:   If necessary, Name of Provider/Nurse Contacted:     Nichole Neyer D ,PharmD Clinical Pharmacist  09/15/2022  1:48 AM

## 2022-09-15 NOTE — Assessment & Plan Note (Addendum)
Not acutely exacerbated Continue home inhalers DuoNebs as needed 

## 2022-09-16 ENCOUNTER — Observation Stay: Payer: 59 | Admitting: Anesthesiology

## 2022-09-16 ENCOUNTER — Encounter
Admission: EM | Disposition: A | Discharge status: Home / Self Care | Payer: Self-pay | Source: Home / Self Care | Attending: Emergency Medicine

## 2022-09-16 DIAGNOSIS — J449 Chronic obstructive pulmonary disease, unspecified: Secondary | ICD-10-CM | POA: Diagnosis not present

## 2022-09-16 DIAGNOSIS — K922 Gastrointestinal hemorrhage, unspecified: Secondary | ICD-10-CM | POA: Diagnosis not present

## 2022-09-16 DIAGNOSIS — J9611 Chronic respiratory failure with hypoxia: Secondary | ICD-10-CM | POA: Diagnosis not present

## 2022-09-16 DIAGNOSIS — A419 Sepsis, unspecified organism: Secondary | ICD-10-CM | POA: Diagnosis not present

## 2022-09-16 DIAGNOSIS — I10 Essential (primary) hypertension: Secondary | ICD-10-CM

## 2022-09-16 DIAGNOSIS — K921 Melena: Secondary | ICD-10-CM | POA: Diagnosis not present

## 2022-09-16 DIAGNOSIS — D649 Anemia, unspecified: Secondary | ICD-10-CM | POA: Diagnosis not present

## 2022-09-16 DIAGNOSIS — I5032 Chronic diastolic (congestive) heart failure: Secondary | ICD-10-CM | POA: Diagnosis not present

## 2022-09-16 HISTORY — PX: COLONOSCOPY WITH PROPOFOL: SHX5780

## 2022-09-16 LAB — PROCALCITONIN: Procalcitonin: <0.1 ng/mL

## 2022-09-16 LAB — CBC
HCT: 36.6 % (ref 36.0–46.0)
Hemoglobin: 11.6 g/dL — ABNORMAL LOW (ref 12.0–15.0)
MCH: 29.1 pg (ref 26.0–34.0)
MCHC: 31.7 g/dL (ref 30.0–36.0)
MCV: 92 fL (ref 80.0–100.0)
Platelets: 178 10*3/uL (ref 150–400)
RBC: 3.98 MIL/uL (ref 3.87–5.11)
RDW: 13.7 % (ref 11.5–15.5)
WBC: 14.2 10*3/uL — ABNORMAL HIGH (ref 4.0–10.5)
nRBC: 0 % (ref 0.0–0.2)

## 2022-09-16 LAB — BASIC METABOLIC PANEL
Anion gap: 10 (ref 5–15)
BUN: 10 mg/dL (ref 8–23)
CO2: 28 mmol/L (ref 22–32)
Calcium: 8.8 mg/dL — ABNORMAL LOW (ref 8.9–10.3)
Chloride: 104 mmol/L (ref 98–111)
Creatinine, Ser: 0.88 mg/dL (ref 0.44–1.00)
GFR, Estimated: >60 mL/min (ref >60)
Glucose, Bld: 93 mg/dL (ref 70–99)
Potassium: 3.4 mmol/L — ABNORMAL LOW (ref 3.5–5.1)
Sodium: 142 mmol/L (ref 135–145)

## 2022-09-16 SURGERY — COLONOSCOPY WITH PROPOFOL
Anesthesia: General

## 2022-09-16 MED ORDER — LIDOCAINE HCL (CARDIAC) PF 100 MG/5ML IV SOSY
PREFILLED_SYRINGE | INTRAVENOUS | Status: DC | PRN
  Administered 2022-09-16: 40 mg via INTRAVENOUS

## 2022-09-16 MED ORDER — PROPOFOL 10 MG/ML IV BOLUS
INTRAVENOUS | Status: DC | PRN
  Administered 2022-09-16: 60 mg via INTRAVENOUS

## 2022-09-16 MED ORDER — PROPOFOL 1000 MG/100ML IV EMUL
INTRAVENOUS | Status: AC
  Filled 2022-09-16: qty 100

## 2022-09-16 MED ORDER — POTASSIUM CHLORIDE CRYS ER 20 MEQ PO TBCR
40.0000 meq | EXTENDED_RELEASE_TABLET | Freq: Once | ORAL | Status: AC
  Administered 2022-09-16: 40 meq via ORAL
  Filled 2022-09-16: qty 2

## 2022-09-16 MED ORDER — SODIUM CHLORIDE 0.9 % IV SOLN: INTRAVENOUS | Status: DC | PRN

## 2022-09-16 MED ORDER — PROPOFOL 500 MG/50ML IV EMUL
INTRAVENOUS | Status: DC | PRN
  Administered 2022-09-16: 150 ug/kg/min via INTRAVENOUS

## 2022-09-16 NOTE — Progress Notes (Signed)
Colonoscopy showed features of ischemic colitis in the splenic flexure which are healing mucosa was pink.  No active bleeding.  Can advance diet serial abdominal exams presently has no abdominal pain.  I will sign off.  Please call me if any further GI concerns or questions.  We would like to thank you for the opportunity to participate in the care of Audrey Sexton.    Dr Jonathon Bellows MD,MRCP [facility]) Gastroenterology/Hepatology Pager: [phone]

## 2022-09-16 NOTE — Anesthesia Postprocedure Evaluation (Signed)
Anesthesia Post Note  Patient: Audrey Sexton  Procedure(s) Performed: COLONOSCOPY WITH PROPOFOL  Patient location during evaluation: PACU Anesthesia Type: General Level of consciousness: awake and alert, oriented and patient cooperative Pain management: pain level controlled Vital Signs Assessment: post-procedure vital signs reviewed and stable Respiratory status: spontaneous breathing, nonlabored ventilation and respiratory function stable Cardiovascular status: blood pressure returned to baseline and stable Postop Assessment: adequate PO intake Anesthetic complications: no   No notable events documented.   Last Vitals:  Vitals:   09/16/22 0936 09/16/22 0946  BP: (!) 142/75 (!) 156/85  Pulse: (!) 102   Resp: 19   Temp: 36.7 C   SpO2: 96%     Last Pain:  Vitals:   09/16/22 0956  TempSrc:   PainSc: 0-No pain                 Darrin Nipper

## 2022-09-16 NOTE — Anesthesia Procedure Notes (Signed)
Date/Time: 09/16/2022 9:24 AM  Performed by: Doreen Salvage, CRNAPre-anesthesia Checklist: Patient identified, Emergency Drugs available, Suction available and Patient being monitored Patient Re-evaluated:Patient Re-evaluated prior to induction Oxygen Delivery Method: Simple face mask Induction Type: IV induction Dental Injury: Teeth and Oropharynx as per pre-operative assessment  Comments: POM mask

## 2022-09-16 NOTE — Op Note (Signed)
[facility] Gastroenterology Patient Name: Audrey Sexton Procedure Date: 09/16/2022 9:17 AM MRN: [id number] Account #: [id number] Date of Birth: 01-15-1951 Admit Type: Inpatient Age: 72 Room: [facility] ENDO ROOM 4 Gender: Female Note Status: Finalized Instrument Name: Prentice Docker [id number] Procedure:             Colonoscopy Indications:           Rectal bleeding Providers:             Wyline Mood MD, MD Referring MD:          Durward Fortes. Hamrick (Referring MD) Medicines:             Monitored Anesthesia Care Complications:         No immediate complications. Procedure:             Pre-Anesthesia Assessment:                        - Prior to the procedure, a History and Physical was                         performed, and patient medications, allergies and                         sensitivities were reviewed. The patient's tolerance                         of previous anesthesia was reviewed.                        - The risks and benefits of the procedure and the                         sedation options and risks were discussed with the                         patient. All questions were answered and informed                         consent was obtained.                        - ASA Grade Assessment: II - A patient with mild                         systemic disease.                        After obtaining informed consent, the colonoscope was                         passed under direct vision. Throughout the procedure,                         the patient's blood pressure, pulse, and oxygen                         saturations were monitored continuously. The                         Colonoscope was introduced through the anus  and                         advanced to the the transverse colon for evaluation.                         This was the intended extent. The colonoscopy was                         performed without difficulty. The colonoscopy was                          performed with moderate difficulty due to significant                         looping. The patient tolerated the procedure well. The                         quality of the bowel preparation was excellent. Findings:      The perianal and digital rectal examinations were normal.      A continuous area of nonbleeding ulcerated mucosa with no stigmata of       recent bleeding was present at the splenic flexure.      The exam was otherwise without abnormality. Impression:            - Mucosal ulceration at the splenic flexure.                        - The examination was otherwise normal.                        - No specimens collected. Recommendation:        - Return patient to hospital ward for ongoing care.                        - Advance diet as tolerated.                        - Continue present medications.                        - Features seen suggestive of ischemic colitis that is                         healing                        Avoid hypotension , keep well hydrated                        I did not go to the cecum as there was looping and                         didnt want to apply a lot of force on the scope to get                         through due to friable mucosa at the splenic flexure  and risk of perforation Procedure Code(s):     --- Professional ---                        [phone], 53, Colonoscopy, flexible; diagnostic,                         including collection of specimen(s) by brushing or                         washing, when performed (separate procedure) Diagnosis Code(s):     --- Professional ---                        K63.3, Ulcer of intestine                        K62.5, Hemorrhage of anus and rectum CPT copyright 2022 American Medical Association. All rights reserved. The codes documented in this report are preliminary and upon coder review may  be revised to meet current compliance requirements. Jonathon Bellows, MD Jonathon Bellows MD,  MD 09/16/2022 9:32:47 AM This report has been signed electronically. Number of Addenda: 0 Note Initiated On: 09/16/2022 9:17 AM Scope Withdrawal Time: 0 hours 1 minute 27 seconds  Total Procedure Duration: 0 hours 6 minutes 11 seconds  Estimated Blood Loss:  Estimated blood loss: none.      [facility]

## 2022-09-16 NOTE — Anesthesia Preprocedure Evaluation (Signed)
Anesthesia Evaluation  Patient identified by MRN, date of birth, ID band Patient awake    Reviewed: Allergy & Precautions, NPO status , Patient's Chart, lab work & pertinent test results  History of Anesthesia Complications Negative for: history of anesthetic complications  Airway Mallampati: III   Neck ROM: Full    Dental  (+) Poor Dentition   Pulmonary asthma , COPD (on 3L home O2), former smoker   Pulmonary exam normal breath sounds clear to auscultation       Cardiovascular hypertension, Normal cardiovascular exam Rhythm:Regular Rate:Normal  ECG 09/15/22: Sinus tachycardia (HR 101)   Neuro/Psych 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)    GI/Hepatic ,GERD  ,,  Endo/Other  Obesity   Renal/GU Renal disease (stage III CKD)     Musculoskeletal   Abdominal   Peds  Hematology  (+) Blood dyscrasia, anemia   Anesthesia Other Findings   Reproductive/Obstetrics                             Anesthesia Physical Anesthesia Plan  ASA: 3  Anesthesia Plan: General   Post-op Pain Management:    Induction: Intravenous  PONV Risk Score and Plan: 3 and Propofol infusion, TIVA and Treatment may vary due to age or medical condition  Airway Management Planned: Natural Airway  Additional Equipment:   Intra-op Plan:   Post-operative Plan:   Informed Consent: I have reviewed the patients History and Physical, chart, labs and discussed the procedure including the risks, benefits and alternatives for the proposed anesthesia with the patient or authorized representative who has indicated his/her understanding and acceptance.       Plan Discussed with: CRNA  Anesthesia Plan Comments: (LMA/GETA backup discussed.  Patient consented for risks of anesthesia including but not limited to:  - adverse reactions to medications - damage to eyes, teeth, lips or other oral mucosa - nerve damage due to  positioning  - sore throat or hoarseness - damage to heart, brain, nerves, lungs, other parts of body or loss of life  Informed patient about role of CRNA in peri- and intra-operative care.  Patient voiced understanding.)        Anesthesia Quick Evaluation  

## 2022-09-16 NOTE — Progress Notes (Signed)
  Progress Note   Patient: Audrey Sexton TIW:[id number] DOB: September 26, 1950 DOA: 09/14/2022     1 DOS: the patient was seen and examined on 09/16/2022   Brief hospital course: 72 y.o. female with medical history significant for COPD on home O2 at 3 L, HTN, prior CVA admitted for hematochezia and acute anemia thought to be due to GI bleed  1/19: EGD showed gastritis.  No active bleeding 1/20: Colonoscopy showed features of ischemic colitis that's healing.  No further bleed  Assessment and Plan: * Hematochezia CTA abdomen and pelvis negative for acute GI bleed EGD did not show any acute bleeder.  Colonoscopy shows findings consistent with ischemic colitis but healing.  No bleeding stigmata.  Avoid hypotension   Leukocytosis SIRS Patient with leukocytosis, low-grade temp and tachycardia that could all be due to hematochezia Patient was treated empirically with cefepime and Flagyl in the ED Will not continue antibiotics for now No  infection  Acute GI bleeding No obvious bleed seen.  EGD and colonoscopy findings as above.  H&H stable  Chronic respiratory failure with hypoxia (HCC) Patient at baseline 3 L oxygen requirement Continue supplemental O2 at 3 L which is her baseline  Chronic diastolic CHF (congestive heart failure) (HCC) Clinically euvolemic   HTN (hypertension) Avoid hypotension due to concern for ischemic colitis seen at colonoscopy today  COPD (chronic obstructive pulmonary disease) ([place]) Not acutely exacerbated Continue home inhalers DuoNebs as needed.        Subjective: No abdominal pain or further bleed.  Just returned from colonoscopy and wants to eat  Physical Exam: Vitals:   09/16/22 0829 09/16/22 0936 09/16/22 0946 09/16/22 1146  BP: 133/81 (!) 142/75 (!) 156/85 (!) 159/66  Pulse: 100 (!) 102  (!) 103  Resp: 20 19    Temp: 98.3 F (36.8 C) 98 F (36.7 C)  97.9 F (36.6 C)  TempSrc: Temporal Temporal  Oral  SpO2: (!) 9% 96%  90%  Weight:       Height:       72 year old female lying in the bed comfortably without any acute distress Lungs clear to auscultation bilaterally Cardiovascular regular rate and rhythm Abdomen soft, benign Neuro alert and awake, nonfocal Data Reviewed:  Potassium 3.4  Family Communication: None  Disposition: Status is: Observation The patient remains OBS appropriate and will d/c before 2 midnights.  Planned Discharge Destination: Home   DVT prophylaxis-SCDs Time spent: 25 minutes  Author: Max Sane, MD 09/16/2022 1:24 PM  For on call review [email or web address].

## 2022-09-16 NOTE — Transfer of Care (Signed)
Immediate Anesthesia Transfer of Care Note  Patient: Audrey Sexton  Procedure(s) Performed: Procedure(s): COLONOSCOPY WITH PROPOFOL (N/A)  Patient Location: PACU and Endoscopy Unit  Anesthesia Type:General  Level of Consciousness: sedated  Airway & Oxygen Therapy: Patient Spontanous Breathing and Patient connected to nasal cannula oxygen  Post-op Assessment: Report given to RN and Post -op Vital signs reviewed and stable  Post vital signs: Reviewed and stable  Last Vitals:  Vitals:   09/16/22 0829 09/16/22 0936  BP: 133/81 (!) 142/75  Pulse: 100 (!) 102  Resp: 20 19  Temp: 36.8 C 36.7 C  SpO2: (!) 9% 16%    Complications: No apparent anesthesia complications

## 2022-09-16 NOTE — Assessment & Plan Note (Signed)
No obvious bleed seen.  EGD and colonoscopy findings as above.  H&H stable

## 2022-09-16 NOTE — Hospital Course (Signed)
72 y.o. female with medical history significant for COPD on home O2 at 3 L, HTN, prior CVA admitted for hematochezia and acute anemia thought to be due to GI bleed  1/19: EGD showed gastritis.  No active bleeding 1/20: Colonoscopy showed features of ischemic colitis that's healing.  No further bleed

## 2022-09-16 NOTE — Care Management CC44 (Signed)
Condition Code 44 Documentation Completed  Patient Details  Name: Audrey Sexton MRN: [id number] Date of Birth: 1951/08/23   Condition Code 44 given:  Yes Patient signature on Condition Code 44 notice:  Yes Documentation of 2 MD's agreement:  Yes Code 44 added to claim:  Yes    Valente David, RN 09/16/2022, 3:14 PM

## 2022-09-16 NOTE — H&P (Signed)
Jonathon Bellows, MD [address], [place], [place], [place], [address] [address], [place], [place], [place], [address] Phone: [phone]  Fax: [phone]  Primary Care Physician:  Leonides Sake, MD   Pre-Procedure History & Physical: HPI:  Audrey Sexton is a 72 y.o. female is here for an colonoscopy.   Past Medical History:  Diagnosis Date   Anemia    Asthma    Carpal tunnel syndrome of left wrist    COPD (chronic obstructive pulmonary disease) ([place])    History of kidney stones    Hyperlipemia    Hypertension    Stroke [facility])    Jan. 2020    Past Surgical History:  Procedure Laterality Date   CARPAL TUNNEL RELEASE Left    COLONOSCOPY N/A 09/07/2020   Procedure: COLONOSCOPY;  Surgeon: Lesly Rubenstein, MD;  Location: [facility] ENDOSCOPY;  Service: Endoscopy;  Laterality: N/A;   fractured tibia Left    repair   ROTATOR CUFF REPAIR Left    TEE WITHOUT CARDIOVERSION N/A 09/09/2018   Procedure: TRANSESOPHAGEAL ECHOCARDIOGRAM (TEE);  Surgeon: Teodoro Spray, MD;  Location: [facility] ORS;  Service: Cardiovascular;  Laterality: N/A;   TUBAL LIGATION      Prior to Admission medications   Medication Sig Start Date End Date Taking? Authorizing Provider  albuterol (PROVENTIL HFA;VENTOLIN HFA) 108 (90 Base) MCG/ACT inhaler Inhale 2-4 puffs by mouth every 4 hours as needed for wheezing, cough, and/or shortness of breath 01/01/17  Yes Hinda Kehr, MD  Ascorbic Acid (VITAMIN C) 1000 MG tablet Take 1,000 mg by mouth daily.   Yes [provider]  aspirin EC 81 MG EC tablet Take 1 tablet (81 mg total) by mouth daily. 09/12/18  Yes Henreitta Leber, MD  atorvastatin (LIPITOR) 80 MG tablet Take 80 mg by mouth daily. 06/30/22  Yes [provider]  Cholecalciferol 25 MCG (1000 UT) tablet Take 1,000 Units by mouth daily.   Yes [provider]  diltiazem (CARDIZEM CD) 300 MG 24 hr capsule Take 1 capsule (300 mg total) by mouth at bedtime. 02/08/20  Yes Dhungel,  Nishant, MD  DULoxetine (CYMBALTA) 60 MG capsule Take 60 mg by mouth every morning. 09/07/22  Yes [provider]  famotidine (PEPCID) 20 MG tablet Take 20 mg by mouth daily. 06/30/22  Yes [provider]  fluticasone (FLONASE) 50 MCG/ACT nasal spray Place 1 spray into both nostrils daily as needed for allergies. 08/27/22  Yes [provider]  fluticasone furoate-vilanterol (BREO ELLIPTA) 100-25 MCG/INH AEPB Inhale 1 puff into the lungs daily. 06/14/17  Yes Sudini, Alveta Heimlich, MD  guaiFENesin (MUCINEX) 600 MG 12 hr tablet Take 1 tablet (600 mg total) by mouth 2 (two) times daily. 02/08/20  Yes Dhungel, Nishant, MD  ipratropium-albuterol (DUONEB) 0.5-2.5 (3) MG/3ML SOLN Take 3 mLs by nebulization every 6 (six) hours as needed. 02/08/20  Yes Dhungel, Nishant, MD  lisinopril (ZESTRIL) 20 MG tablet Take 20 mg by mouth daily. 06/30/22  Yes [provider]  montelukast (SINGULAIR) 10 MG tablet Take 10 mg by mouth daily. 07/31/22  Yes [provider]  Multiple Vitamin (MULTI-VITAMIN DAILY PO) Take 1 tablet by mouth daily.   Yes [provider]  omeprazole (PRILOSEC) 20 MG capsule Take 20 mg by mouth daily.   Yes [provider]  oxybutynin (DITROPAN XL) 15 MG 24 hr tablet Take 15 mg by mouth daily. 02/03/20  Yes [provider]  potassium chloride SA (K-DUR,KLOR-CON) 20 MEQ tablet Take  1 tablet (20 mEq total) by mouth daily. 06/15/17  Yes Sudini, Wardell Heath, MD  umeclidinium bromide (INCRUSE ELLIPTA) 62.5 MCG/INH AEPB Inhale 1 puff into the lungs daily.   Yes [provider]  vitamin B-12 (CYANOCOBALAMIN) 1000 MCG tablet Take 1,000 mcg by mouth daily.    Yes [provider]  furosemide (LASIX) 40 MG tablet Take 40 mg by mouth daily. Patient not taking: Reported on 09/07/2020    [provider]  lisinopril-hydrochlorothiazide (ZESTORETIC) 20-12.5 MG tablet Take 1 tablet by mouth daily. Patient not taking: Reported on  09/15/2022 12/29/19   [provider]  meloxicam (MOBIC) 15 MG tablet Take 15 mg by mouth daily. Patient not taking: Reported on 09/07/2020    [provider]  PARoxetine (PAXIL) 20 MG tablet Take 20 mg by mouth daily. Patient not taking: Reported on 09/15/2022    [provider]  tiotropium (SPIRIVA) 18 MCG inhalation capsule Place 18 mcg into inhaler and inhale daily. Patient not taking: Reported on 09/15/2022    [provider]  venlafaxine XR (EFFEXOR-XR) 150 MG 24 hr capsule Take 150 mg by mouth at bedtime.  Patient not taking: Reported on 09/15/2022 07/28/18   [provider]    Allergies as of 09/14/2022 - Review Complete 09/14/2022  Allergen Reaction Noted   Percocet [oxycodone-acetaminophen] Nausea And Vomiting 08/17/2016   Vicodin [hydrocodone-acetaminophen] Nausea And Vomiting 08/17/2016   Penicillins Hives 08/17/2016    Family History  Problem Relation Age of Onset   Heart disease Mother        died at 76   Diabetes Mother    Alzheimer's disease Father        died at 49   Parkinson's disease Sister    Heart disease Brother        died at 104   Diabetes Brother    Kidney disease Sister    Breast cancer Sister     Social History   Socioeconomic History   Marital status: Widowed    Spouse name: Not on file   Number of children: Not on file   Years of education: Not on file   Highest education level: Not on file  Occupational History   Occupation: retired     Comment: Conservation officer, nature    Comment: school bus driver  Tobacco Use   Smoking status: Former    Packs/day: 1.00    Years: 40.00    Total pack years: 40.00    Types: Cigarettes   Smokeless tobacco: Never  Vaping Use   Vaping Use: Never used  Substance and Sexual Activity   Alcohol use: No   Drug use: No   Sexual activity: Not on file  Other Topics Concern   Not on file  Social History Narrative   Not on file   Social Determinants of Health   Financial Resource  Strain: Low Risk  (09/09/2018)   Overall Financial Resource Strain (CARDIA)    Difficulty of Paying Living Expenses: Not very hard  Food Insecurity: No Food Insecurity (09/15/2022)   Hunger Vital Sign    Worried About Running Out of Food in the Last Year: Never true    Ran Out of Food in the Last Year: Never true  Transportation Needs: Unmet Transportation Needs (09/15/2022)   PRAPARE - Transportation    Lack of Transportation (Medical): Yes    Lack of Transportation (Non-Medical): Yes  Physical Activity: Unknown (09/09/2018)   Exercise Vital Sign    Days of Exercise per Week: 0 days  Minutes of Exercise per Session: Not on file  Stress: Not on file  Social Connections: Unknown (09/09/2018)   Social Connection and Isolation Panel [NHANES]    Frequency of Communication with Friends and Family: More than three times a week    Frequency of Social Gatherings with Friends and Family: Once a week    Attends Religious Services: Never    Marine scientist or Organizations: Not on file    Attends Archivist Meetings: Not on file    Marital Status: Widowed  Intimate Partner Violence: Not At Risk (09/15/2022)   Humiliation, Afraid, Rape, and Kick questionnaire    Fear of Current or Ex-Partner: No    Emotionally Abused: No    Physically Abused: No    Sexually Abused: No    Review of Systems: See HPI, otherwise negative ROS  Physical Exam: BP 133/81   Pulse 100   Temp 98.3 F (36.8 C) (Temporal)   Resp 20   Ht 5\' 4"  (1.626 m)   Wt 88.9 kg   SpO2 (!) 9%   BMI 33.64 kg/m  General:   Alert,  pleasant and cooperative in NAD Head:  Normocephalic and atraumatic. Neck:  Supple; no masses or thyromegaly. Lungs:  Clear throughout to auscultation, normal respiratory effort.    Heart:  +S1, +S2, Regular rate and rhythm, No edema. Abdomen:  Soft, nontender and nondistended. Normal bowel sounds, without guarding, and without rebound.   Neurologic:  Alert and  oriented x4;   grossly normal neurologically.  Impression/Plan: Audrey Sexton is here for an colonoscopy to be performed for rectal bleeding  Risks, benefits, limitations, and alternatives regarding  colonoscopy have been reviewed with the patient.  Questions have been answered.  All parties agreeable.   Jonathon Bellows, MD  09/16/2022, 9:00 AM

## 2022-09-16 NOTE — Anesthesia Postprocedure Evaluation (Signed)
Anesthesia Post Note  Patient: Audrey Sexton  Procedure(s) Performed: ESOPHAGOGASTRODUODENOSCOPY (EGD) WITH PROPOFOL  Patient location during evaluation: PACU Anesthesia Type: General Level of consciousness: awake and alert, oriented and patient cooperative Pain management: pain level controlled Vital Signs Assessment: post-procedure vital signs reviewed and stable Respiratory status: spontaneous breathing, nonlabored ventilation and respiratory function stable Cardiovascular status: blood pressure returned to baseline and stable Postop Assessment: adequate PO intake Anesthetic complications: no   No notable events documented.   Last Vitals:  Vitals:   09/16/22 0740 09/16/22 0829  BP: (!) 152/61 133/81  Pulse: 93 100  Resp: 18 20  Temp: 36.9 C 36.8 C  SpO2: 94% (!) 9%    Last Pain:  Vitals:   09/16/22 0829  TempSrc: Temporal  PainSc:                  Darrin Nipper

## 2022-09-17 DIAGNOSIS — K921 Melena: Secondary | ICD-10-CM | POA: Diagnosis not present

## 2022-09-17 DIAGNOSIS — A419 Sepsis, unspecified organism: Secondary | ICD-10-CM | POA: Diagnosis not present

## 2022-09-17 DIAGNOSIS — K922 Gastrointestinal hemorrhage, unspecified: Secondary | ICD-10-CM | POA: Diagnosis not present

## 2022-09-17 DIAGNOSIS — J9611 Chronic respiratory failure with hypoxia: Secondary | ICD-10-CM | POA: Diagnosis not present

## 2022-09-17 DIAGNOSIS — I5032 Chronic diastolic (congestive) heart failure: Secondary | ICD-10-CM | POA: Diagnosis not present

## 2022-09-17 LAB — BASIC METABOLIC PANEL
Anion gap: 8 (ref 5–15)
BUN: 11 mg/dL (ref 8–23)
CO2: 28 mmol/L (ref 22–32)
Calcium: 8.8 mg/dL — ABNORMAL LOW (ref 8.9–10.3)
Chloride: 103 mmol/L (ref 98–111)
Creatinine, Ser: 0.91 mg/dL (ref 0.44–1.00)
GFR, Estimated: >60 mL/min (ref >60)
Glucose, Bld: 108 mg/dL — ABNORMAL HIGH (ref 70–99)
Potassium: 3.5 mmol/L (ref 3.5–5.1)
Sodium: 139 mmol/L (ref 135–145)

## 2022-09-17 LAB — CBC
HCT: 34.4 % — ABNORMAL LOW (ref 36.0–46.0)
Hemoglobin: 11.1 g/dL — ABNORMAL LOW (ref 12.0–15.0)
MCH: 29.4 pg (ref 26.0–34.0)
MCHC: 32.3 g/dL (ref 30.0–36.0)
MCV: 91 fL (ref 80.0–100.0)
Platelets: 180 10*3/uL (ref 150–400)
RBC: 3.78 MIL/uL — ABNORMAL LOW (ref 3.87–5.11)
RDW: 13.7 % (ref 11.5–15.5)
WBC: 9.8 10*3/uL (ref 4.0–10.5)
nRBC: 0 % (ref 0.0–0.2)

## 2022-09-17 LAB — PROCALCITONIN: Procalcitonin: <0.1 ng/mL

## 2022-09-17 NOTE — Plan of Care (Signed)

## 2022-09-17 NOTE — Discharge Summary (Signed)
Physician Discharge Summary   Patient: Audrey Sexton MRN: [id number] DOB: 11/04/1950  Admit date:     09/14/2022  Discharge date: 09/17/22  Discharge Physician: Delfino Lovett   PCP: Ailene Ravel, MD   Recommendations at discharge:   Follow-up with outpatient providers as requested  Discharge Diagnoses: Principal Problem:   Hematochezia Active Problems:   Leukocytosis   COPD (chronic obstructive pulmonary disease) (HCC)   HTN (hypertension)   Chronic diastolic CHF (congestive heart failure) (HCC)   Chronic respiratory failure with hypoxia (HCC)   Acute GI bleeding   Sepsis without acute organ dysfunction [facility])  Hospital Course: 72 y.o. female with medical history significant for COPD on home O2 at 3 L, HTN, prior CVA admitted for hematochezia and acute anemia thought to be due to GI bleed  1/19: EGD showed gastritis.  No active bleeding 1/20: Colonoscopy showed features of ischemic colitis that's healing.  No further bleed  Assessment and Plan: * Hematochezia Acute GI bleed CTA abdomen and pelvis negative for acute GI bleed EGD did not show any acute bleeder.  Colonoscopy shows findings consistent with ischemic colitis but healing.  No bleeding stigmata.  Avoid hypotension No further bleeding while in the hospital.  H&H remained stable.  Patient tolerated diet without any issues.  Leukocytosis SIRS Patient with leukocytosis, low-grade temp and tachycardia that is likely due to hematochezia Patient was treated empirically with cefepime and Flagyl in the ED Will not continue antibiotics for now No infection  Chronic respiratory failure with hypoxia (HCC) Patient at baseline 3 L oxygen requirement  Chronic diastolic CHF (congestive heart failure) (HCC) Clinically euvolemic  HTN (hypertension) Avoid hypotension due to concern for ischemic colitis seen at colonoscopy  COPD (chronic obstructive pulmonary disease) (HCC) Not acutely exacerbated          Consultants: GI Procedures performed: EGD and colonoscopy Disposition: Home Diet recommendation:  Discharge Diet Orders (From admission, onward)     Start     Ordered   09/17/22 0000  Diet - low sodium heart healthy        09/17/22 1144           Carb modified diet DISCHARGE MEDICATION: Allergies as of 09/17/2022       Reactions   Percocet [oxycodone-acetaminophen] Nausea And Vomiting   Vicodin [hydrocodone-acetaminophen] Nausea And Vomiting   Penicillins Hives   Has patient had a PCN reaction causing immediate rash, facial/tongue/throat swelling, SOB or lightheadedness with hypotension: no Has patient had a PCN reaction causing severe rash involving mucus membranes or skin necrosis: no Has patient had a PCN reaction that required hospitalization  no Has patient had a PCN reaction occurring within the last 10 years: no If all of the above answers are "NO", then may proceed with Cephalosporin use.        Medication List     STOP taking these medications    furosemide 40 MG tablet Commonly known as: LASIX   lisinopril-hydrochlorothiazide 20-12.5 MG tablet Commonly known as: ZESTORETIC   meloxicam 15 MG tablet Commonly known as: MOBIC   PARoxetine 20 MG tablet Commonly known as: PAXIL   tiotropium 18 MCG inhalation capsule Commonly known as: SPIRIVA   venlafaxine XR 150 MG 24 hr capsule Commonly known as: EFFEXOR-XR       TAKE these medications    albuterol 108 (90 Base) MCG/ACT inhaler Commonly known as: VENTOLIN HFA Inhale 2-4 puffs by mouth every 4 hours as needed for wheezing, cough, and/or shortness of breath  aspirin EC 81 MG tablet Take 1 tablet (81 mg total) by mouth daily.   atorvastatin 80 MG tablet Commonly known as: LIPITOR Take 80 mg by mouth daily.   Cholecalciferol 25 MCG (1000 UT) tablet Take 1,000 Units by mouth daily.   cyanocobalamin 1000 MCG tablet Commonly known as: VITAMIN B12 Take 1,000 mcg by mouth daily.    diltiazem 300 MG 24 hr capsule Commonly known as: CARDIZEM CD Take 1 capsule (300 mg total) by mouth at bedtime.   DULoxetine 60 MG capsule Commonly known as: CYMBALTA Take 60 mg by mouth every morning.   famotidine 20 MG tablet Commonly known as: PEPCID Take 20 mg by mouth daily.   fluticasone 50 MCG/ACT nasal spray Commonly known as: FLONASE Place 1 spray into both nostrils daily as needed for allergies.   fluticasone furoate-vilanterol 100-25 MCG/INH Aepb Commonly known as: BREO ELLIPTA Inhale 1 puff into the lungs daily.   guaiFENesin 600 MG 12 hr tablet Commonly known as: MUCINEX Take 1 tablet (600 mg total) by mouth 2 (two) times daily.   ipratropium-albuterol 0.5-2.5 (3) MG/3ML Soln Commonly known as: DUONEB Take 3 mLs by nebulization every 6 (six) hours as needed.   lisinopril 20 MG tablet Commonly known as: ZESTRIL Take 20 mg by mouth daily.   montelukast 10 MG tablet Commonly known as: SINGULAIR Take 10 mg by mouth daily.   MULTI-VITAMIN DAILY PO Take 1 tablet by mouth daily.   omeprazole 20 MG capsule Commonly known as: PRILOSEC Take 20 mg by mouth daily.   oxybutynin 15 MG 24 hr tablet Commonly known as: DITROPAN XL Take 15 mg by mouth daily.   potassium chloride SA 20 MEQ tablet Commonly known as: KLOR-CON M Take 1 tablet (20 mEq total) by mouth daily.   umeclidinium bromide 62.5 MCG/INH Aepb Commonly known as: INCRUSE ELLIPTA Inhale 1 puff into the lungs daily.   vitamin C 1000 MG tablet Take 1,000 mg by mouth daily.        Follow-up Information     Hamrick, Lorin Mercy, MD. Schedule an appointment as soon as possible for a visit in 1 week(s).   Specialty: Family Medicine Why: [facility] Discharge F/UP Contact information: [place] [address] [phone]                Discharge Exam: Filed Weights   09/14/22 2019 09/15/22 1235  Weight: 88.9 kg 21.9 kg   72 year old female lying in the bed  comfortably without any acute distress Lungs clear to auscultation bilaterally Cardiovascular regular rate and rhythm Abdomen soft, benign Neuro alert and awake, nonfocal  Condition at discharge: good  The results of significant diagnostics from this hospitalization (including imaging, microbiology, ancillary and laboratory) are listed below for reference.   Imaging Studies: CT ANGIO GI BLEED  Result Date: 09/15/2022 CLINICAL DATA:  bright red blood per rectum, abdominal pain. Right lower quadrant pain EXAM: CTA ABDOMEN AND PELVIS WITHOUT AND WITH CONTRAST TECHNIQUE: Multidetector CT imaging of the abdomen and pelvis was performed using the standard protocol during bolus administration of intravenous contrast. Multiplanar reconstructed images and MIPs were obtained and reviewed to evaluate the vascular anatomy. RADIATION DOSE REDUCTION: This exam was performed according to the departmental dose-optimization program which includes automated exposure control, adjustment of the mA and/or kV according to patient size and/or use of iterative reconstruction technique. CONTRAST:  143mL OMNIPAQUE IOHEXOL 350 MG/ML SOLN COMPARISON:  CT angiography chest 08/02/2018, CT renal 05/27/2007 FINDINGS: VASCULAR No extravasation  of intravenous contrast within the lumen of the bowel. Aorta: Moderate calcified and noncalcified atherosclerotic plaque. Normal caliber aorta without aneurysm, dissection, vasculitis or significant stenosis. Celiac: Mild atherosclerotic plaque. At least mild to moderate narrowing of the origin of the celiac artery with opacification distally. Patent without evidence of aneurysm, dissection, vasculitis or significant stenosis. SMA: Mild atherosclerotic plaque. Patent without evidence of aneurysm, dissection, vasculitis or significant stenosis. Renals: Mild atherosclerotic plaque. Both renal arteries are patent without evidence of aneurysm, dissection, vasculitis, fibromuscular dysplasia or  significant stenosis. IMA: Patent without evidence of aneurysm, dissection, vasculitis or significant stenosis. Inflow: Mild atherosclerotic plaque. Patent without evidence of aneurysm, dissection, vasculitis or significant stenosis. Proximal Outflow: Bilateral common femoral and visualized portions of the superficial and profunda femoral arteries are patent without evidence of aneurysm, dissection, vasculitis or significant stenosis. Veins: The main portal, splenic, superior mesenteric veins are patent. Review of the MIP images confirms the above findings. NON-VASCULAR Lower chest: Slightly more conspicuous chronic 7 x 6 mm left lower lobe pulmonary nodule-no further follow-up indicated. No acute abnormality. Hepatobiliary: Nonenhancing subcentimeter hypodensities small to characterize. Nonenhancing simple hepatic cyst measuring up to 2.4 cm. No gallstones, gallbladder wall thickening, or pericholecystic fluid. No biliary dilatation. Pancreas: Diffusely atrophic. No focal lesion. Otherwise normal pancreatic contour. No surrounding inflammatory changes. No main pancreatic ductal dilatation. Spleen: Normal in size. Vague nonspecific hypodensity. Splenule noted. Adrenals/Urinary Tract: No adrenal nodule bilaterally. Bilateral kidneys enhance symmetrically. Lobulated simple fluid density lesions within the right kidney likely represent simple renal cysts. The inferior-most cyst demonstrates thin septation/calcification-likely minimally complex. Minimally complex and simple renal cysts, in the absence of clinically indicated signs/symptoms, require no independent follow-up. No hydronephrosis. No hydroureter.  4 mm right nephrolithiasis. The urinary bladder is unremarkable. Stomach/Bowel: Stomach is within normal limits. No evidence of bowel wall thickening or dilatation. Third portion of the duodenum diverticula. No pneumatosis. Appendix appears normal. Lymphatic: No lymphadenopathy. Reproductive: Uterus and bilateral  adnexa are unremarkable. Other: No intraperitoneal free fluid. No intraperitoneal free gas. No organized fluid collection. Musculoskeletal: No abdominal wall hernia or abnormality. No suspicious lytic or blastic osseous lesions. No acute displaced fracture. Multilevel degenerative changes of the spine with intervertebral disc space vacuum phenomenon and posterior disc osteophyte complex formation. Associated osseous neural foraminal stenosis. IMPRESSION: VASCULAR 1. No CT evidence of active gastrointestinal hemorrhage. 2. No acute abdominal aorta abnormality. 3.  Aortic Atherosclerosis (ICD10-I70.0). NON-VASCULAR 1. No acute intra-abdominal or intrapelvic abnormality. Electronically Signed   By: Tish Frederickson M.D.   On: 09/15/2022 00:54    Microbiology: Results for orders placed or performed during the hospital encounter of 09/14/22  Blood Culture (routine x 2)     Status: None (Preliminary result)   Collection Time: 09/14/22 11:41 PM   Specimen: BLOOD RIGHT ARM  Result Value Ref Range Status   Specimen Description BLOOD RIGHT ARM  Final   Special Requests   Final    BOTTLES DRAWN AEROBIC AND ANAEROBIC Blood Culture adequate volume   Culture   Final    NO GROWTH 3 DAYS Performed at [facility], [address] [place], [place] [address]    Report Status PENDING  Incomplete  Blood Culture (routine x 2)     Status: None (Preliminary result)   Collection Time: 09/15/22  1:23 AM   Specimen: BLOOD RIGHT ARM  Result Value Ref Range Status   Specimen Description BLOOD RIGHT ARM  Final   Special Requests   Final    BOTTLES DRAWN AEROBIC  AND ANAEROBIC Blood Culture adequate volume   Culture   Final    NO GROWTH 2 DAYS Performed at [facility], [address] [place], [place] [address]    Report Status PENDING  Incomplete    Labs: CBC: Recent Labs  Lab 09/14/22 2023 09/15/22 0650 09/15/22 1636 09/15/22 2154 09/16/22 0658 09/17/22 0426  WBC 20.9*  --   --   --   14.2* 9.8  NEUTROABS 16.9*  --   --   --   --   --   HGB 13.5 11.5* 11.3* 11.3* 11.6* 11.1*  HCT 42.3  --   --   --  36.6 34.4*  MCV 92.4  --   --   --  92.0 91.0  PLT 226  --   --   --  178 226   Basic Metabolic Panel: Recent Labs  Lab 09/14/22 2023 09/16/22 0658 09/17/22 0426  NA 140 142 139  K 4.1 3.4* 3.5  CL 101 104 103  CO2 30 28 28   GLUCOSE 168* 93 108*  BUN 18 10 11   CREATININE 1.20* 0.88 0.91  CALCIUM 9.6 8.8* 8.8*   Liver Function Tests: Recent Labs  Lab 09/14/22 2023  AST 31  ALT 26  ALKPHOS 45  BILITOT 0.8  PROT 7.0  ALBUMIN 3.6   CBG: No results for input(s): "GLUCAP" in the last 168 hours.  Discharge time spent: greater than 30 minutes.  Signed: Max Sane, MD [facility] 09/17/2022

## 2022-09-18 ENCOUNTER — Encounter: Payer: Self-pay | Admitting: Gastroenterology

## 2022-09-19 LAB — CULTURE, BLOOD (ROUTINE X 2)
Culture: NO GROWTH
Special Requests: ADEQUATE

## 2022-09-20 LAB — CULTURE, BLOOD (ROUTINE X 2)
Culture: NO GROWTH
Special Requests: ADEQUATE

## 2022-09-26 DIAGNOSIS — J449 Chronic obstructive pulmonary disease, unspecified: Secondary | ICD-10-CM | POA: Diagnosis not present

## 2022-09-26 DIAGNOSIS — R049 Hemorrhage from respiratory passages, unspecified: Secondary | ICD-10-CM | POA: Diagnosis not present

## 2022-09-26 DIAGNOSIS — I10 Essential (primary) hypertension: Secondary | ICD-10-CM | POA: Diagnosis not present

## 2022-09-27 DIAGNOSIS — E785 Hyperlipidemia, unspecified: Secondary | ICD-10-CM | POA: Diagnosis not present

## 2022-09-27 DIAGNOSIS — N1832 Chronic kidney disease, stage 3b: Secondary | ICD-10-CM | POA: Diagnosis not present

## 2022-09-27 DIAGNOSIS — J449 Chronic obstructive pulmonary disease, unspecified: Secondary | ICD-10-CM | POA: Diagnosis not present

## 2022-09-27 DIAGNOSIS — I10 Essential (primary) hypertension: Secondary | ICD-10-CM | POA: Diagnosis not present

## 2022-10-09 DIAGNOSIS — R049 Hemorrhage from respiratory passages, unspecified: Secondary | ICD-10-CM | POA: Diagnosis not present

## 2022-10-09 DIAGNOSIS — J449 Chronic obstructive pulmonary disease, unspecified: Secondary | ICD-10-CM | POA: Diagnosis not present

## 2022-10-09 DIAGNOSIS — I10 Essential (primary) hypertension: Secondary | ICD-10-CM | POA: Diagnosis not present

## 2022-10-26 DIAGNOSIS — I10 Essential (primary) hypertension: Secondary | ICD-10-CM | POA: Diagnosis not present

## 2022-10-26 DIAGNOSIS — R049 Hemorrhage from respiratory passages, unspecified: Secondary | ICD-10-CM | POA: Diagnosis not present

## 2022-10-26 DIAGNOSIS — J449 Chronic obstructive pulmonary disease, unspecified: Secondary | ICD-10-CM | POA: Diagnosis not present

## 2023-02-13 ENCOUNTER — Other Ambulatory Visit: Payer: Self-pay | Admitting: Family Medicine

## 2023-02-13 DIAGNOSIS — J9611 Chronic respiratory failure with hypoxia: Secondary | ICD-10-CM | POA: Diagnosis not present

## 2023-02-13 DIAGNOSIS — Z23 Encounter for immunization: Secondary | ICD-10-CM | POA: Diagnosis not present

## 2023-02-13 DIAGNOSIS — J449 Chronic obstructive pulmonary disease, unspecified: Secondary | ICD-10-CM | POA: Diagnosis not present

## 2023-02-13 DIAGNOSIS — I700 Atherosclerosis of aorta: Secondary | ICD-10-CM | POA: Diagnosis not present

## 2023-02-13 DIAGNOSIS — I10 Essential (primary) hypertension: Secondary | ICD-10-CM | POA: Diagnosis not present

## 2023-02-13 DIAGNOSIS — E785 Hyperlipidemia, unspecified: Secondary | ICD-10-CM | POA: Diagnosis not present

## 2023-02-13 DIAGNOSIS — N1832 Chronic kidney disease, stage 3b: Secondary | ICD-10-CM | POA: Diagnosis not present

## 2023-02-13 DIAGNOSIS — Z1231 Encounter for screening mammogram for malignant neoplasm of breast: Secondary | ICD-10-CM

## 2023-02-13 DIAGNOSIS — I471 Supraventricular tachycardia, unspecified: Secondary | ICD-10-CM | POA: Diagnosis not present

## 2023-02-13 DIAGNOSIS — I69359 Hemiplegia and hemiparesis following cerebral infarction affecting unspecified side: Secondary | ICD-10-CM | POA: Diagnosis not present

## 2023-02-13 DIAGNOSIS — R7303 Prediabetes: Secondary | ICD-10-CM | POA: Diagnosis not present

## 2023-02-13 DIAGNOSIS — R6889 Other general symptoms and signs: Secondary | ICD-10-CM | POA: Diagnosis not present

## 2023-03-14 DIAGNOSIS — R6889 Other general symptoms and signs: Secondary | ICD-10-CM | POA: Diagnosis not present

## 2023-03-22 ENCOUNTER — Telehealth: Payer: Self-pay | Admitting: Pharmacist

## 2023-03-22 NOTE — Progress Notes (Signed)
Contacted patient regarding upcoming appointment with [facility] pharmacist.  Per clinical review no pharmacist appointment needed at this time.   Attempted to contact patient for scheduled appointment for medication management. Attempted to leave a HIPAA compliant message for patient to return my call at their convenience. I was disconnected from voice mail x2 attempts.   Reynold Bowen, PharmD Clinical Pharmacist [place] Direct Dial: [phone]

## 2023-05-04 ENCOUNTER — Other Ambulatory Visit: Payer: Self-pay

## 2023-05-04 ENCOUNTER — Inpatient Hospital Stay
Admission: EM | Admit: 2023-05-04 | Discharge: 2023-05-09 | DRG: 190 | Disposition: A | Discharge status: Home Health | Payer: Medicare HMO | Attending: Student in an Organized Health Care Education/Training Program | Admitting: Student in an Organized Health Care Education/Training Program

## 2023-05-04 ENCOUNTER — Emergency Department: Payer: Medicare HMO

## 2023-05-04 ENCOUNTER — Encounter: Payer: Self-pay | Admitting: Intensive Care

## 2023-05-04 DIAGNOSIS — K219 Gastro-esophageal reflux disease without esophagitis: Secondary | ICD-10-CM | POA: Diagnosis present

## 2023-05-04 DIAGNOSIS — Z66 Do not resuscitate: Secondary | ICD-10-CM | POA: Diagnosis not present

## 2023-05-04 DIAGNOSIS — Z79899 Other long term (current) drug therapy: Secondary | ICD-10-CM | POA: Diagnosis not present

## 2023-05-04 DIAGNOSIS — F32A Depression, unspecified: Secondary | ICD-10-CM | POA: Diagnosis not present

## 2023-05-04 DIAGNOSIS — I110 Hypertensive heart disease with heart failure: Secondary | ICD-10-CM | POA: Diagnosis not present

## 2023-05-04 DIAGNOSIS — I10 Essential (primary) hypertension: Secondary | ICD-10-CM | POA: Diagnosis present

## 2023-05-04 DIAGNOSIS — R0602 Shortness of breath: Secondary | ICD-10-CM

## 2023-05-04 DIAGNOSIS — J9621 Acute and chronic respiratory failure with hypoxia: Secondary | ICD-10-CM

## 2023-05-04 DIAGNOSIS — Z91030 Bee allergy status: Secondary | ICD-10-CM | POA: Diagnosis not present

## 2023-05-04 DIAGNOSIS — R0902 Hypoxemia: Secondary | ICD-10-CM | POA: Diagnosis not present

## 2023-05-04 DIAGNOSIS — Z841 Family history of disorders of kidney and ureter: Secondary | ICD-10-CM

## 2023-05-04 DIAGNOSIS — R069 Unspecified abnormalities of breathing: Secondary | ICD-10-CM | POA: Diagnosis not present

## 2023-05-04 DIAGNOSIS — J441 Chronic obstructive pulmonary disease with (acute) exacerbation: Principal | ICD-10-CM | POA: Diagnosis present

## 2023-05-04 DIAGNOSIS — I5032 Chronic diastolic (congestive) heart failure: Secondary | ICD-10-CM | POA: Diagnosis present

## 2023-05-04 DIAGNOSIS — Z880 Allergy status to penicillin: Secondary | ICD-10-CM | POA: Diagnosis not present

## 2023-05-04 DIAGNOSIS — F419 Anxiety disorder, unspecified: Secondary | ICD-10-CM | POA: Diagnosis present

## 2023-05-04 DIAGNOSIS — R062 Wheezing: Secondary | ICD-10-CM | POA: Diagnosis not present

## 2023-05-04 DIAGNOSIS — N3281 Overactive bladder: Secondary | ICD-10-CM | POA: Diagnosis present

## 2023-05-04 DIAGNOSIS — Z820 Family history of epilepsy and other diseases of the nervous system: Secondary | ICD-10-CM | POA: Diagnosis not present

## 2023-05-04 DIAGNOSIS — Z833 Family history of diabetes mellitus: Secondary | ICD-10-CM | POA: Diagnosis not present

## 2023-05-04 DIAGNOSIS — E785 Hyperlipidemia, unspecified: Secondary | ICD-10-CM | POA: Diagnosis present

## 2023-05-04 DIAGNOSIS — Z8249 Family history of ischemic heart disease and other diseases of the circulatory system: Secondary | ICD-10-CM | POA: Diagnosis not present

## 2023-05-04 DIAGNOSIS — J9611 Chronic respiratory failure with hypoxia: Secondary | ICD-10-CM | POA: Diagnosis present

## 2023-05-04 DIAGNOSIS — Z8673 Personal history of transient ischemic attack (TIA), and cerebral infarction without residual deficits: Secondary | ICD-10-CM | POA: Diagnosis not present

## 2023-05-04 DIAGNOSIS — Z9981 Dependence on supplemental oxygen: Secondary | ICD-10-CM

## 2023-05-04 DIAGNOSIS — Z885 Allergy status to narcotic agent status: Secondary | ICD-10-CM

## 2023-05-04 DIAGNOSIS — F17210 Nicotine dependence, cigarettes, uncomplicated: Secondary | ICD-10-CM | POA: Diagnosis not present

## 2023-05-04 DIAGNOSIS — Z7982 Long term (current) use of aspirin: Secondary | ICD-10-CM | POA: Diagnosis not present

## 2023-05-04 DIAGNOSIS — R918 Other nonspecific abnormal finding of lung field: Secondary | ICD-10-CM | POA: Diagnosis not present

## 2023-05-04 DIAGNOSIS — R000 Tachycardia, unspecified: Secondary | ICD-10-CM | POA: Diagnosis not present

## 2023-05-04 DIAGNOSIS — R0603 Acute respiratory distress: Secondary | ICD-10-CM | POA: Diagnosis not present

## 2023-05-04 DIAGNOSIS — E739 Lactose intolerance, unspecified: Secondary | ICD-10-CM | POA: Diagnosis present

## 2023-05-04 DIAGNOSIS — J449 Chronic obstructive pulmonary disease, unspecified: Secondary | ICD-10-CM | POA: Diagnosis not present

## 2023-05-04 LAB — BASIC METABOLIC PANEL
Anion gap: 9 (ref 5–15)
BUN: 18 mg/dL (ref 8–23)
CO2: 29 mmol/L (ref 22–32)
Calcium: 9.3 mg/dL (ref 8.9–10.3)
Chloride: 104 mmol/L (ref 98–111)
Creatinine, Ser: 1.05 mg/dL — ABNORMAL HIGH (ref 0.44–1.00)
GFR, Estimated: 56 mL/min — ABNORMAL LOW (ref >60)
Glucose, Bld: 121 mg/dL — ABNORMAL HIGH (ref 70–99)
Potassium: 3.6 mmol/L (ref 3.5–5.1)
Sodium: 142 mmol/L (ref 135–145)

## 2023-05-04 LAB — HEPATIC FUNCTION PANEL
ALT: 32 U/L (ref 0–44)
AST: 33 U/L (ref 15–41)
Albumin: 3.6 g/dL (ref 3.5–5.0)
Alkaline Phosphatase: 54 U/L (ref 38–126)
Bilirubin, Direct: <0.1 mg/dL (ref 0.0–0.2)
Total Bilirubin: 0.5 mg/dL (ref 0.3–1.2)
Total Protein: 6.9 g/dL (ref 6.5–8.1)

## 2023-05-04 LAB — CBC
HCT: 37.7 % (ref 36.0–46.0)
Hemoglobin: 12 g/dL (ref 12.0–15.0)
MCH: 29.1 pg (ref 26.0–34.0)
MCHC: 31.8 g/dL (ref 30.0–36.0)
MCV: 91.3 fL (ref 80.0–100.0)
Platelets: 246 10*3/uL (ref 150–400)
RBC: 4.13 MIL/uL (ref 3.87–5.11)
RDW: 14 % (ref 11.5–15.5)
WBC: 9.4 10*3/uL (ref 4.0–10.5)
nRBC: 0 % (ref 0.0–0.2)

## 2023-05-04 LAB — LACTIC ACID, PLASMA
Lactic Acid, Venous: 1.5 mmol/L (ref 0.5–1.9)
Lactic Acid, Venous: 1.5 mmol/L (ref 0.5–1.9)

## 2023-05-04 LAB — BRAIN NATRIURETIC PEPTIDE: B Natriuretic Peptide: 97.5 pg/mL (ref 0.0–100.0)

## 2023-05-04 LAB — TROPONIN I (HIGH SENSITIVITY): Troponin I (High Sensitivity): 7 ng/L (ref <18)

## 2023-05-04 MED ORDER — MAGNESIUM HYDROXIDE 400 MG/5ML PO SUSP: 30.0000 mL | Freq: Every day | ORAL | Status: DC | PRN

## 2023-05-04 MED ORDER — MAGNESIUM SULFATE 2 GM/50ML IV SOLN
2.0000 g | Freq: Once | INTRAVENOUS | Status: AC
  Administered 2023-05-04: 2 g via INTRAVENOUS
  Filled 2023-05-04: qty 50

## 2023-05-04 MED ORDER — IPRATROPIUM-ALBUTEROL 0.5-2.5 (3) MG/3ML IN SOLN
3.0000 mL | Freq: Four times a day (QID) | RESPIRATORY_TRACT | Status: DC
  Administered 2023-05-05 – 2023-05-09 (×17): 3 mL via RESPIRATORY_TRACT
  Filled 2023-05-04 (×17): qty 3

## 2023-05-04 MED ORDER — ACETAMINOPHEN 325 MG PO TABS
650.0000 mg | ORAL_TABLET | Freq: Four times a day (QID) | ORAL | Status: DC | PRN
  Administered 2023-05-06 – 2023-05-08 (×2): 650 mg via ORAL
  Filled 2023-05-04 (×2): qty 2

## 2023-05-04 MED ORDER — ONDANSETRON HCL 4 MG/2ML IJ SOLN: 4.0000 mg | Freq: Four times a day (QID) | INTRAMUSCULAR | Status: DC | PRN

## 2023-05-04 MED ORDER — ENOXAPARIN SODIUM 40 MG/0.4ML IJ SOSY
40.0000 mg | PREFILLED_SYRINGE | INTRAMUSCULAR | Status: DC
  Administered 2023-05-05 – 2023-05-09 (×5): 40 mg via SUBCUTANEOUS
  Filled 2023-05-04 (×5): qty 0.4

## 2023-05-04 MED ORDER — IPRATROPIUM-ALBUTEROL 0.5-2.5 (3) MG/3ML IN SOLN
3.0000 mL | Freq: Once | RESPIRATORY_TRACT | Status: AC
  Administered 2023-05-04: 3 mL via RESPIRATORY_TRACT
  Filled 2023-05-04: qty 3

## 2023-05-04 MED ORDER — IPRATROPIUM-ALBUTEROL 0.5-2.5 (3) MG/3ML IN SOLN
3.0000 mL | RESPIRATORY_TRACT | Status: DC | PRN
  Administered 2023-05-05: 3 mL via RESPIRATORY_TRACT
  Filled 2023-05-04: qty 3

## 2023-05-04 MED ORDER — GUAIFENESIN ER 600 MG PO TB12
600.0000 mg | ORAL_TABLET | Freq: Two times a day (BID) | ORAL | Status: DC
  Administered 2023-05-05 – 2023-05-09 (×10): 600 mg via ORAL
  Filled 2023-05-04 (×10): qty 1

## 2023-05-04 MED ORDER — ONDANSETRON HCL 4 MG PO TABS: 4.0000 mg | ORAL_TABLET | Freq: Four times a day (QID) | ORAL | Status: DC | PRN

## 2023-05-04 MED ORDER — TRAZODONE HCL 50 MG PO TABS
25.0000 mg | ORAL_TABLET | Freq: Every evening | ORAL | Status: DC | PRN
  Administered 2023-05-07: 25 mg via ORAL
  Filled 2023-05-04: qty 1

## 2023-05-04 MED ORDER — ALBUTEROL SULFATE (2.5 MG/3ML) 0.083% IN NEBU
2.5000 mg | INHALATION_SOLUTION | Freq: Once | RESPIRATORY_TRACT | Status: AC
  Administered 2023-05-04: 2.5 mg via RESPIRATORY_TRACT
  Filled 2023-05-04: qty 3

## 2023-05-04 MED ORDER — ACETAMINOPHEN 325 MG RE SUPP: 650.0000 mg | Freq: Four times a day (QID) | RECTAL | Status: DC | PRN

## 2023-05-04 MED ORDER — SODIUM CHLORIDE 0.9 % IV SOLN: INTRAVENOUS | Status: DC

## 2023-05-04 MED ORDER — HYDROCOD POLI-CHLORPHE POLI ER 10-8 MG/5ML PO SUER
5.0000 mL | Freq: Two times a day (BID) | ORAL | Status: DC | PRN
  Administered 2023-05-07 – 2023-05-08 (×2): 5 mL via ORAL
  Filled 2023-05-04 (×2): qty 5

## 2023-05-04 MED ORDER — SODIUM CHLORIDE 0.9 % IV SOLN
1.0000 g | INTRAVENOUS | Status: AC
  Administered 2023-05-05 – 2023-05-09 (×5): 1 g via INTRAVENOUS
  Filled 2023-05-04 (×5): qty 10

## 2023-05-04 NOTE — ED Notes (Signed)
Pt assisted to restroom. Brief changed, beverages offered and given. Pt became tachy and sob upon ambulation back to bed. Placed on 3 L o2, Spo2 remains at 96%. Call light within reach.

## 2023-05-04 NOTE — ED Notes (Signed)
Pt taken to the room via wheelchair and attached to the monitor; primary RN made aware of the patients arrival.

## 2023-05-04 NOTE — ED Triage Notes (Addendum)
Patient arrived by EMS from home with c/o COPD exacerbation. Difficulty breathing started Tuesday night and has progressively gotten worse. Patient took two neb treatments at home. Patient wears 3L O2 continuously    EMS administered 2 duonebs and 125 solumedrol. EMS reports wheezing has improved.  EMS vitals:  97% 4L O2 placed by EMS 151/74 b/p 120HR

## 2023-05-04 NOTE — ED Notes (Addendum)
Per Harvin Hazel, tech, Dr. Erma Heritage reports repeat EKG once in an ER room.

## 2023-05-04 NOTE — ED Notes (Signed)
Request made for transport to the floor ?

## 2023-05-04 NOTE — ED Notes (Signed)
Dr. Erma Heritage confirmed pt can eat and drink. Pt ask for food and drink. Provided sandwich tray and diet lemon lime shasta

## 2023-05-05 DIAGNOSIS — I10 Essential (primary) hypertension: Secondary | ICD-10-CM | POA: Diagnosis not present

## 2023-05-05 DIAGNOSIS — J441 Chronic obstructive pulmonary disease with (acute) exacerbation: Secondary | ICD-10-CM | POA: Diagnosis not present

## 2023-05-05 DIAGNOSIS — K219 Gastro-esophageal reflux disease without esophagitis: Secondary | ICD-10-CM | POA: Insufficient documentation

## 2023-05-05 DIAGNOSIS — E785 Hyperlipidemia, unspecified: Secondary | ICD-10-CM

## 2023-05-05 DIAGNOSIS — J9621 Acute and chronic respiratory failure with hypoxia: Secondary | ICD-10-CM

## 2023-05-05 LAB — BASIC METABOLIC PANEL
Anion gap: 9 (ref 5–15)
BUN: 16 mg/dL (ref 8–23)
CO2: 28 mmol/L (ref 22–32)
Calcium: 9.2 mg/dL (ref 8.9–10.3)
Chloride: 104 mmol/L (ref 98–111)
Creatinine, Ser: 1.1 mg/dL — ABNORMAL HIGH (ref 0.44–1.00)
GFR, Estimated: 53 mL/min — ABNORMAL LOW (ref >60)
Glucose, Bld: 241 mg/dL — ABNORMAL HIGH (ref 70–99)
Potassium: 3.8 mmol/L (ref 3.5–5.1)
Sodium: 141 mmol/L (ref 135–145)

## 2023-05-05 LAB — CBC
HCT: 37.9 % (ref 36.0–46.0)
Hemoglobin: 12.2 g/dL (ref 12.0–15.0)
MCH: 28.9 pg (ref 26.0–34.0)
MCHC: 32.2 g/dL (ref 30.0–36.0)
MCV: 89.8 fL (ref 80.0–100.0)
Platelets: 239 10*3/uL (ref 150–400)
RBC: 4.22 MIL/uL (ref 3.87–5.11)
RDW: 14 % (ref 11.5–15.5)
WBC: 8.2 10*3/uL (ref 4.0–10.5)
nRBC: 0 % (ref 0.0–0.2)

## 2023-05-05 LAB — TROPONIN I (HIGH SENSITIVITY): Troponin I (High Sensitivity): 6 ng/L (ref <18)

## 2023-05-05 LAB — PROCALCITONIN: Procalcitonin: <0.1 ng/mL

## 2023-05-05 MED ORDER — PANTOPRAZOLE SODIUM 40 MG PO TBEC
40.0000 mg | DELAYED_RELEASE_TABLET | Freq: Every day | ORAL | Status: DC
  Administered 2023-05-05 – 2023-05-09 (×5): 40 mg via ORAL
  Filled 2023-05-05 (×5): qty 1

## 2023-05-05 MED ORDER — LISINOPRIL 20 MG PO TABS
20.0000 mg | ORAL_TABLET | Freq: Every day | ORAL | Status: DC
  Administered 2023-05-05 – 2023-05-09 (×5): 20 mg via ORAL
  Filled 2023-05-05 (×5): qty 1

## 2023-05-05 MED ORDER — PREDNISONE 50 MG PO TABS
50.0000 mg | ORAL_TABLET | Freq: Every day | ORAL | Status: AC
  Administered 2023-05-06 – 2023-05-09 (×4): 50 mg via ORAL
  Filled 2023-05-05 (×4): qty 1

## 2023-05-05 MED ORDER — FAMOTIDINE 20 MG PO TABS: 20.0000 mg | ORAL_TABLET | Freq: Every day | ORAL | Status: DC

## 2023-05-05 MED ORDER — ADULT MULTIVITAMIN W/MINERALS CH
ORAL_TABLET | Freq: Every day | ORAL | Status: DC
  Administered 2023-05-05 – 2023-05-09 (×5): 1 via ORAL
  Filled 2023-05-05 (×5): qty 1

## 2023-05-05 MED ORDER — OXYBUTYNIN CHLORIDE ER 5 MG PO TB24
15.0000 mg | ORAL_TABLET | Freq: Every day | ORAL | Status: DC
  Administered 2023-05-05 – 2023-05-09 (×5): 15 mg via ORAL
  Filled 2023-05-05 (×5): qty 1

## 2023-05-05 MED ORDER — DILTIAZEM HCL ER COATED BEADS 300 MG PO CP24
300.0000 mg | ORAL_CAPSULE | Freq: Every day | ORAL | Status: DC
  Administered 2023-05-05 – 2023-05-08 (×4): 300 mg via ORAL
  Filled 2023-05-05 (×5): qty 1

## 2023-05-05 MED ORDER — MONTELUKAST SODIUM 10 MG PO TABS
10.0000 mg | ORAL_TABLET | Freq: Every day | ORAL | Status: DC
  Administered 2023-05-05 – 2023-05-09 (×5): 10 mg via ORAL
  Filled 2023-05-05 (×5): qty 1

## 2023-05-05 MED ORDER — ASPIRIN 81 MG PO TBEC
81.0000 mg | DELAYED_RELEASE_TABLET | Freq: Every day | ORAL | Status: DC
  Administered 2023-05-05 – 2023-05-09 (×5): 81 mg via ORAL
  Filled 2023-05-05 (×5): qty 1

## 2023-05-05 MED ORDER — FLUTICASONE PROPIONATE 50 MCG/ACT NA SUSP: 1.0000 | Freq: Every day | NASAL | Status: DC | PRN

## 2023-05-05 MED ORDER — DULOXETINE HCL 30 MG PO CPEP
60.0000 mg | ORAL_CAPSULE | Freq: Every morning | ORAL | Status: DC
  Administered 2023-05-05 – 2023-05-09 (×5): 60 mg via ORAL
  Filled 2023-05-05 (×5): qty 2

## 2023-05-05 MED ORDER — ATORVASTATIN CALCIUM 20 MG PO TABS
80.0000 mg | ORAL_TABLET | Freq: Every day | ORAL | Status: DC
  Administered 2023-05-05 – 2023-05-09 (×5): 80 mg via ORAL
  Filled 2023-05-05 (×5): qty 4

## 2023-05-05 NOTE — Assessment & Plan Note (Addendum)
-   This associated with asthma exacerbation. - The patient will be admitted to a medically monitored bed. - We will place the patient IV steroid therapy with IV Solu-Medrol as well as nebulized bronchodilator therapy with duonebs q.i.d. and q.4 hours p.r.n.Marland Kitchen - Mucolytic therapy will be provided with Mucinex and antibiotic therapy with IV Rocephin. - O2 protocol will be followed. - We will hold off Breo and continue Singulair.

## 2023-05-05 NOTE — Assessment & Plan Note (Signed)
- 

## 2023-05-05 NOTE — Discharge Instructions (Signed)
Transportation Resources  Agency Name: [facility] Agency Address: [address] [place], [place] [address] Phone: [phone] Email: [email or web address] Website: [email or web address] Service(s) Offered: Housing services, self-sufficiency, congregate meal program, weatherization program, Field seismologist program, emergency food assistance,  housing counseling, home ownership program, wheels-towork program.  Agency Name: [facility] [employer] [phone]) Address: [address], [place], [place] [address] Phone: [phone] Website: [email or web address] Service(s) Offered: Transportation for [employer], subscription and demand response; Dial-a-Ride for citizens 59 years of age or older.  Agency Name: Department of Social Services Address: [address] [place], [place] [address] Phone: [phone] Service(s) Offered: Child support services; child welfare services; food stamps; Medicaid; work first family assistance; and aid with fuel,  rent, food and medicine, transportation assistance.  Agency Name: Disabled [employer] ([employer]  Network Phone: [phone] Service(s) Offered: Transports veterans to the [facility] medical center. Call  forty-eight hours in advance and leave the name, telephone  number, date, and time of appointment. Veteran will be  contacted by the driver the day before the appointment to  arrange a pick up point

## 2023-05-05 NOTE — Progress Notes (Signed)
PROGRESS NOTE    Audrey Sexton   ZOX:[id number] DOB: November 20, 1950  DOA: 05/04/2023 Date of Service: 05/05/23 PCP: Ailene Ravel, MD     Brief Narrative / Hospital Course:  Audrey Sexton is a 72 y.o. female with medical history significant for asthma, COPD on 3L O2, hypertension, dyslipidemia, and CVA, who presented to the emergency room with acute onset of worsening dyspnea with associated cough productive of yellowish sputum as well as wheezing x4 days.  09/07: to ED, HR 114 (EKG sinus tach), RR 24, SpO2 94% on 3L O2 [place] but occasionally requiring 4L. CXR (+)emphysematous lung disease with mild chronic mid right lung and bibasal scarring and/or atelectasis. Tx nebs w/ EMS/ED and IV steroid x1 w/ EMS, admitted to hospitalist service for COPD exacerbation w/ (+)SIRS criteria.    Consultants:  none  Procedures: none      ASSESSMENT & PLAN:   Principal Problem:   COPD exacerbation (HCC) Active Problems:   Acute on chronic respiratory failure with hypoxia (HCC)   Essential hypertension   Dyslipidemia   GERD without esophagitis  COPD/Asthma exacerbation Acute on chronic hypoxic respiratory failure  Bronchodilators - MA+SABA DuoNeb q4h scheduled + additional prn IV steroids, plan transition to po tomorrow  Mucolytics  Cough suppressant as needed w/ Tussionex IV ceftriaxone for now Continue home montelukast  Supplemental O2 Hold home ICS-LABA Breo for now  Hold home LAMA Incruse  Consider viral panel    Hx HFpEF - Echo 2021 LVEF 65-70%, G1DD BNP here normal Monitor fluid status, no diuretics needed at this time  D/c IV fluids if taking po   Allergies  Continue home Flonase   Essential HTN Continue home lisinopril, diltiazem,   Hx CVA ASA + statin   HLD Statin  GERD w/o esophagitis PPI + H2B  Anxiety/Depression Continue home Cymbalta  Overactive Bladder  Continue home oxybutinin     Body mass index is 33.38 kg/m.    DVT prophylaxis:  lovenox  IV fluids: no continuous IV fluids  Nutrition: cardiac diet  Central lines / invasive devices: none  Code Status: DNR ACP documentation reviewed: 05/05/23 has living will on [facility]  Current Admission Status: inpatient   LOS: 1 TOC needs: none at this time  Barriers to discharge / significant pending items: COPD exacerbation, await clinical improvement expect will be here another 1-2 days              Subjective / Brief ROS:  Patient reports doing better today but still significant SOB Denies CP but feels a bit tight Pain controlled.  Denies new weakness.  Tolerating diet.  Reports no concerns w/ urination/defecation.   Family Communication: none at this time    Objective Findings:  Vitals:   05/05/23 0006 05/05/23 0440 05/05/23 0726 05/05/23 1157  BP: (!) 159/77 (!) 167/95 (!) 169/82 132/75  Pulse: (!) 110 (!) 106 (!) 109 (!) 112  Resp: 16 20 17 16   Temp: 97.7 F (36.5 C) 98 F (36.7 C) 98 F (36.7 C) 97.7 F (36.5 C)  TempSrc: Oral Oral  Oral  SpO2: 97% 96% 96% 96%  Weight: 88.2 kg     Height: 5\' 4"  (1.626 m)       Intake/Output Summary (Last 24 hours) at 05/05/2023 1448 Last data filed at 05/05/2023 1430 Gross per 24 hour  Intake 650 ml  Output 1 ml  Net 649 ml   Filed Weights   05/04/23 1850 05/05/23 0006  Weight: 83.5 kg  88.2 kg    Examination:  Physical Exam Constitutional:      General: She is not in acute distress. Cardiovascular:     Rate and Rhythm: Normal rate and regular rhythm.  Pulmonary:     Effort: Pulmonary effort is normal. No accessory muscle usage.     Breath sounds: Wheezing present.  Neurological:     General: No focal deficit present.     Mental Status: She is alert and oriented to person, place, and time.  Psychiatric:        Mood and Affect: Mood normal.        Behavior: Behavior normal.          Scheduled Medications:   aspirin EC  81 mg Oral Daily   atorvastatin  80 mg Oral Daily    diltiazem  300 mg Oral QHS   DULoxetine  60 mg Oral q morning   enoxaparin (LOVENOX) injection  40 mg Subcutaneous Q24H   guaiFENesin  600 mg Oral BID   ipratropium-albuterol  3 mL Nebulization QID   lisinopril  20 mg Oral Daily   montelukast  10 mg Oral Daily   multivitamin with minerals   Oral Daily   oxybutynin  15 mg Oral Daily   pantoprazole  40 mg Oral Daily   [START ON 05/06/2023] predniSONE  50 mg Oral Q breakfast    Continuous Infusions:  cefTRIAXone (ROCEPHIN)  IV 1 g (05/05/23 0021)    PRN Medications:  acetaminophen **OR** acetaminophen, chlorpheniramine-HYDROcodone, fluticasone, ipratropium-albuterol, magnesium hydroxide, ondansetron **OR** ondansetron (ZOFRAN) IV, traZODone  Antimicrobials from admission:  Anti-infectives (From admission, onward)    Start     Dose/Rate Route Frequency Ordered Stop   05/04/23 2345  cefTRIAXone (ROCEPHIN) 1 g in sodium chloride 0.9 % 100 mL IVPB        1 g 200 mL/hr over 30 Minutes Intravenous Every 24 hours 05/04/23 2341 05/09/23 2344           Data Reviewed:  I have personally reviewed the following...  CBC: Recent Labs  Lab 05/04/23 1859 05/05/23 0104  WBC 9.4 8.2  HGB 12.0 12.2  HCT 37.7 37.9  MCV 91.3 89.8  PLT 246 239   Basic Metabolic Panel: Recent Labs  Lab 05/04/23 1859 05/05/23 0104  NA 142 141  K 3.6 3.8  CL 104 104  CO2 29 28  GLUCOSE 121* 241*  BUN 18 16  CREATININE 1.05* 1.10*  CALCIUM 9.3 9.2   GFR: Estimated Creatinine Clearance: 49 mL/min (A) (by C-G formula based on SCr of 1.1 mg/dL (H)). Liver Function Tests: Recent Labs  Lab 05/04/23 2058  AST 33  ALT 32  ALKPHOS 54  BILITOT 0.5  PROT 6.9  ALBUMIN 3.6   No results for input(s): "LIPASE", "AMYLASE" in the last 168 hours. No results for input(s): "AMMONIA" in the last 168 hours. Coagulation Profile: No results for input(s): "INR", "PROTIME" in the last 168 hours. Cardiac Enzymes: No results for input(s): "CKTOTAL", "CKMB",  "CKMBINDEX", "TROPONINI" in the last 168 hours. BNP (last 3 results) No results for input(s): "PROBNP" in the last 8760 hours. HbA1C: No results for input(s): "HGBA1C" in the last 72 hours. CBG: No results for input(s): "GLUCAP" in the last 168 hours. Lipid Profile: No results for input(s): "CHOL", "HDL", "LDLCALC", "TRIG", "CHOLHDL", "LDLDIRECT" in the last 72 hours. Thyroid Function Tests: No results for input(s): "TSH", "T4TOTAL", "FREET4", "T3FREE", "THYROIDAB" in the last 72 hours. Anemia Panel: No results for input(s): "VITAMINB12", "FOLATE", "FERRITIN", "  TIBC", "IRON", "RETICCTPCT" in the last 72 hours. Most Recent Urinalysis On File:     Component Value Date/Time   COLORURINE AMBER (A) 09/14/2022 2023   APPEARANCEUR CLOUDY (A) 09/14/2022 2023   APPEARANCEUR Clear 06/16/2014 2108   LABSPEC 1.025 09/14/2022 2023   LABSPEC 1.003 06/16/2014 2108   PHURINE 5.0 09/14/2022 2023   GLUCOSEU NEGATIVE 09/14/2022 2023   GLUCOSEU Negative 06/16/2014 2108   HGBUR NEGATIVE 09/14/2022 2023   BILIRUBINUR NEGATIVE 09/14/2022 2023   BILIRUBINUR Negative 06/16/2014 2108   KETONESUR 5 (A) 09/14/2022 2023   PROTEINUR 100 (A) 09/14/2022 2023   NITRITE NEGATIVE 09/14/2022 2023   LEUKOCYTESUR TRACE (A) 09/14/2022 2023   LEUKOCYTESUR Negative 06/16/2014 2108   Sepsis Labs: @LABRCNTIP (procalcitonin:4,lacticidven:4) Microbiology: No results found for this or any previous visit (from the past 240 hour(s)).    Radiology Studies last 3 days: DG Chest 2 View  Result Date: 05/04/2023 CLINICAL DATA:  COPD. EXAM: CHEST - 2 VIEW COMPARISON:  November 15, 2020 FINDINGS: The heart size and mediastinal contours are within normal limits. There is marked severity calcification of the thoracic aorta. There is evidence of emphysematous lung disease with mild, chronic mid right lung and bibasilar scarring and/or atelectasis. Mild, chronic right-sided volume loss is also seen. There is no evidence of acute  infiltrate, pleural effusion or pneumothorax. Multilevel degenerative changes seen throughout the thoracic spine. IMPRESSION: Emphysematous lung disease with mild, chronic mid right lung and bibasilar scarring and/or atelectasis. Electronically Signed   By: Aram Candela M.D.   On: 05/04/2023 20:22             LOS: 1 day    Time spent: 25 min    Sunnie Nielsen, DO [facility] 05/05/2023, 2:48 PM    Dictation software may have been used to generate the above note. Typos may occur and escape review in typed/dictated notes. Please contact Dr Lyn Hollingshead directly for clarity if needed.  Staff may message me via secure chat in Epic  but this may not receive an immediate response,  please page me for urgent matters!  If 7PM-7AM, please contact night coverage [email or web address]

## 2023-05-05 NOTE — Hospital Course (Addendum)
Audrey Sexton is a 72 y.o. female with medical history significant for asthma, COPD on 3L O2, hypertension, dyslipidemia, and CVA, who presented to the emergency room with acute onset of worsening dyspnea with associated cough productive of yellowish sputum as well as wheezing x4 days.  09/07: to ED, HR 114 (EKG sinus tach), RR 24, SpO2 94% on 3L O2 [place] but occasionally requiring 4L. CXR (+)emphysematous lung disease with mild chronic mid right lung and bibasal scarring and/or atelectasis. Tx nebs w/ EMS/ED and IV steroid x1 w/ EMS, admitted to hospitalist service for COPD exacerbation w/ (+)SIRS criteria.  09/08: significant O2 desat on ambulation. No discharge today    Consultants:  none  Procedures: none      ASSESSMENT & PLAN:   Principal Problem:   COPD exacerbation (HCC) Active Problems:   Acute on chronic respiratory failure with hypoxia (HCC)   Essential hypertension   Dyslipidemia   GERD without esophagitis  COPD/Asthma exacerbation+ Acute on chronic hypoxic respiratory failure  Bronchodilators - MA+SABA DuoNeb q4h scheduled + additional prn IV steroids transitioned to po  significant wheezing, ICS w/ budesonide Mucolytics  Cough suppressant as needed w/ Tussionex IV ceftriaxone for now Continue home montelukast  Supplemental O2 Hold home ICS-LABA Breo for now  Hold home LAMA Incruse  Consider viral panel   Hx HFpEF - Echo 2021 LVEF 65-70%, G1DD BNP here normal Monitor fluid status, no diuretics needed at this time  D/c IV fluids  Allergies  Continue home Flonase   Essential HTN Continue home lisinopril, diltiazem,   Hx CVA ASA + statin   HLD Statin  GERD w/o esophagitis PPI + H2B  Anxiety/Depression Continue home Cymbalta  Overactive Bladder  Continue home oxybutinin     Body mass index is 33.38 kg/m.    DVT prophylaxis: lovenox  IV fluids: no continuous IV fluids  Nutrition: cardiac diet  Central lines / invasive devices:  none  Code Status: DNR ACP documentation reviewed: 05/05/23 has living will on file in [facility]  Current Admission Status: inpatient   LOS: 1 TOC needs: none at this time  Barriers to discharge / significant pending items: COPD exacerbation, await clinical improvement expect will be here another 1-2 days

## 2023-05-05 NOTE — ED Provider Notes (Signed)
[facility] Provider Note    Event Date/Time   First MD Initiated Contact with Patient 05/04/23 1936     (approximate)   History   Shortness of Breath   HPI  Audrey Sexton is a 72 y.o. female  here with SOB. Pt has a h/o COPD. Reports she has had cough, wheezing, SOB for hte last several days. She states she was outside after her yard was cut which can occasionally cause her COPD to get worse. Reports she has been coughing, wheezing, with significant dyspnea. Is on 3L chronically but increased this to 4. No chest pain. No fevers or chills.      Physical Exam   Triage Vital Signs: ED Triage Vitals  Encounter Vitals Group     BP 05/04/23 1849 (!) 133/92     Systolic BP Percentile --      Diastolic BP Percentile --      Pulse Rate 05/04/23 1849 (!) 114     Resp 05/04/23 1849 (!) 24     Temp 05/04/23 1849 98.3 F (36.8 C)     Temp Source 05/04/23 1849 Oral     SpO2 05/04/23 1845 94 %     Weight 05/04/23 1850 184 lb (83.5 kg)     Height 05/04/23 1850 5' 4.5" (1.638 m)     Head Circumference --      Peak Flow --      Pain Score 05/04/23 1850 6     Pain Loc --      Pain Education --      Exclude from Growth Chart --     Most recent vital signs: Vitals:   05/04/23 2230 05/05/23 0006  BP: (!) 154/68 (!) 159/77  Pulse: (!) 110 (!) 110  Resp: 20 16  Temp:  97.7 F (36.5 C)  SpO2: 94% 97%     General: Awake, no distress.  CV:  Good peripheral perfusion. Tachycardic, regular. Resp:  Increased WOB with diffuse wheezing, tachypnea. Speaking in short sentences. Abd:  No distention. No tenderness. Other:  No LE edema.   ED Results / Procedures / Treatments   Labs (all labs ordered are listed, but only abnormal results are displayed) Labs Reviewed  BASIC METABOLIC PANEL - Abnormal; Notable for the following components:      Result Value   Glucose, Bld 121 (*)    Creatinine, Ser 1.05 (*)    GFR, Estimated 56 (*)    All other components  within normal limits  BASIC METABOLIC PANEL - Abnormal; Notable for the following components:   Glucose, Bld 241 (*)    Creatinine, Ser 1.10 (*)    GFR, Estimated 53 (*)    All other components within normal limits  CBC  LACTIC ACID, PLASMA  LACTIC ACID, PLASMA  HEPATIC FUNCTION PANEL  BRAIN NATRIURETIC PEPTIDE  CBC  TROPONIN I (HIGH SENSITIVITY)  TROPONIN I (HIGH SENSITIVITY)     EKG Sinus tachycardia, VR 110. PR 155, QRS 78, QTc 512. No acute St elevations or depressions. QT prolonged.   RADIOLOGY CXR: Chronic emphysema   I also independently reviewed and agree with radiologist interpretations.   PROCEDURES:  Critical Care performed: No  .1-3 Lead EKG Interpretation  Performed by: Shaune Pollack, MD Authorized by: Shaune Pollack, MD     Interpretation: non-specific     ECG rate:  90-110   ECG rate assessment: tachycardic     Rhythm: sinus tachycardia     Ectopy: none  Conduction: normal   Comments:     Indication: SOB     MEDICATIONS ORDERED IN ED: Medications  cefTRIAXone (ROCEPHIN) 1 g in sodium chloride 0.9 % 100 mL IVPB (1 g Intravenous New Bag/Given 05/05/23 0021)  enoxaparin (LOVENOX) injection 40 mg (has no administration in time range)  0.9 %  sodium chloride infusion ( Intravenous New Bag/Given 05/05/23 0018)  acetaminophen (TYLENOL) tablet 650 mg (has no administration in time range)    Or  acetaminophen (TYLENOL) suppository 650 mg (has no administration in time range)  traZODone (DESYREL) tablet 25 mg (has no administration in time range)  ondansetron (ZOFRAN) tablet 4 mg (has no administration in time range)    Or  ondansetron (ZOFRAN) injection 4 mg (has no administration in time range)  magnesium hydroxide (MILK OF MAGNESIA) suspension 30 mL (has no administration in time range)  guaiFENesin (MUCINEX) 12 hr tablet 600 mg (600 mg Oral Given 05/05/23 0022)  chlorpheniramine-HYDROcodone (TUSSIONEX) 10-8 MG/5ML suspension 5 mL (has no  administration in time range)  ipratropium-albuterol (DUONEB) 0.5-2.5 (3) MG/3ML nebulizer solution 3 mL (has no administration in time range)  ipratropium-albuterol (DUONEB) 0.5-2.5 (3) MG/3ML nebulizer solution 3 mL (3 mLs Nebulization Given 05/05/23 0150)  ipratropium-albuterol (DUONEB) 0.5-2.5 (3) MG/3ML nebulizer solution 3 mL (3 mLs Nebulization Given 05/04/23 2053)  magnesium sulfate IVPB 2 g 50 mL (0 g Intravenous Stopped 05/04/23 2158)  albuterol (PROVENTIL) (2.5 MG/3ML) 0.083% nebulizer solution 2.5 mg (2.5 mg Nebulization Given 05/04/23 2050)     IMPRESSION / MDM / ASSESSMENT AND PLAN / ED COURSE  I reviewed the triage vital signs and the nursing notes.                              Differential diagnosis includes, but is not limited to, COPD exacerbation, PNA, COVID-19, anemia, CHF, ACS  Patient's presentation is most consistent with acute presentation with potential threat to life or bodily function.  The patient is on the cardiac monitor to evaluate for evidence of arrhythmia and/or significant heart rate changes  72 yo F with h/o COPD on 3L O2 chronically here with cough, wheezing, SOB. Clinically, suspect recurrent COPD exacerbation. No fevers or signs of sepsis. CXR shows no focal PNA. CBC, BMP unremarkable. Trop negative and EKG nonischemic, no signs of ACS or CHF. Pt has diffuse, ongoing wheezing after nebs, steroids and has been taking regular nebs at home. Will admit for COPD exacerbation.    FINAL CLINICAL IMPRESSION(S) / ED DIAGNOSES   Final diagnoses:  COPD exacerbation (HCC)  Shortness of breath     Rx / DC Orders   ED Discharge Orders     None        Note:  This document was prepared using Dragon voice recognition software and may include unintentional dictation errors.   Shaune Pollack, MD 05/05/23 [phone]

## 2023-05-05 NOTE — TOC CM/SW Note (Signed)
Patient flagged for SDOH transportation resources. Resources have been added to the AVS.  Charisa Twitty, LCSW Transitions of Care Department [phone]

## 2023-05-05 NOTE — Assessment & Plan Note (Signed)
-   This is currently improving. - O2 protocol will be followed.

## 2023-05-05 NOTE — H&P (Addendum)
[place]   PATIENT NAME: Audrey Sexton    MR#:  [id number]  DATE OF BIRTH:  Feb 28, 1951  DATE OF ADMISSION:  05/04/2023  PRIMARY CARE PHYSICIAN: Hamrick, Durward Fortes, MD   Patient is coming from: Home  REQUESTING/REFERRING PHYSICIAN: Shaune Pollack, MD  CHIEF COMPLAINT:   Chief Complaint  Patient presents with   Shortness of Breath    HISTORY OF PRESENT ILLNESS:  Audrey Sexton is a 72 y.o. female with medical history significant for asthma, COPD, hypertension, dyslipidemia, and CVA, who presented to the emergency room with acute onset of worsening dyspnea with associated cough productive of yellowish sputum as well as wheezing since Tuesday.  She denied any fever or chills.  No nausea or vomiting or abdominal pain.  No dysuria, oliguria or hematuria or flank pain.  No chest pain or palpitations.  ED Course: When came to the ER, BP was 133/92 with heart rate of 114 and respiratory to 24 and pulse oximetry 94% on 3 L of O2 via nasal cannula.  She initially required 4 L above her home 3 L.  Labs revealed blood glucose of 121 with creatinine 1.05 and CBC was unremarkable.  Lactic acid was 1.5 twice. EKG as reviewed by me : EKG showed sinus tachycardia with a rate of 112 with no acute changes. Imaging: Two-view chest x-ray showed emphysematous lung disease with mild chronic mid right lung and bibasal scarring and/or atelectasis.  The patient was given 2 DuoNebs and 125 mg of IV Solu-Medrol by EMS and 1 DuoNeb here.  She will be admitted to a medical telemetry bed for further evaluation and management. PAST MEDICAL HISTORY:   Past Medical History:  Diagnosis Date   Anemia    Asthma    Carpal tunnel syndrome of left wrist    COPD (chronic obstructive pulmonary disease) (HCC)    History of kidney stones    Hyperlipemia    Hypertension    Stroke [facility])    Ava Tangney. 2020    PAST SURGICAL HISTORY:   Past Surgical History:  Procedure Laterality Date   CARPAL TUNNEL RELEASE Left     COLONOSCOPY N/A 09/07/2020   Procedure: COLONOSCOPY;  Surgeon: Regis Bill, MD;  Location: [facility] ENDOSCOPY;  Service: Endoscopy;  Laterality: N/A;   COLONOSCOPY WITH PROPOFOL N/A 09/16/2022   Procedure: COLONOSCOPY WITH PROPOFOL;  Surgeon: Wyline Mood, MD;  Location: [facility] ENDOSCOPY;  Service: Endoscopy;  Laterality: N/A;   ESOPHAGOGASTRODUODENOSCOPY (EGD) WITH PROPOFOL N/A 09/15/2022   Procedure: ESOPHAGOGASTRODUODENOSCOPY (EGD) WITH PROPOFOL;  Surgeon: Jaynie Collins, DO;  Location: [facility] ENDOSCOPY;  Service: Gastroenterology;  Laterality: N/A;   fractured tibia Left    repair   ROTATOR CUFF REPAIR Left    TEE WITHOUT CARDIOVERSION N/A 09/09/2018   Procedure: TRANSESOPHAGEAL ECHOCARDIOGRAM (TEE);  Surgeon: Dalia Heading, MD;  Location: [facility] ORS;  Service: Cardiovascular;  Laterality: N/A;   TUBAL LIGATION      SOCIAL HISTORY:   Social History   Tobacco Use   Smoking status: Former    Current packs/day: 1.00    Average packs/day: 1 pack/day for 40.0 years (40.0 ttl pk-yrs)    Types: Cigarettes   Smokeless tobacco: Never  Substance Use Topics   Alcohol use: No    FAMILY HISTORY:   Family History  Problem Relation Age of Onset   Heart disease Mother        died at 3   Diabetes Mother    Alzheimer's disease Father  died at 110   Parkinson's disease Sister    Heart disease Brother        died at 39   Diabetes Brother    Kidney disease Sister    Breast cancer Sister     DRUG ALLERGIES:   Allergies  Allergen Reactions   Bee Venom Shortness Of Breath and Swelling   Lactose Intolerance (Gi) Diarrhea   Percocet [Oxycodone-Acetaminophen] Nausea And Vomiting   Vicodin [Hydrocodone-Acetaminophen] Nausea And Vomiting   Penicillins Hives    Has patient had a PCN reaction causing immediate rash, facial/tongue/throat swelling, SOB or lightheadedness with hypotension: no Has patient had a PCN reaction causing severe rash involving mucus membranes or skin  necrosis: no Has patient had a PCN reaction that required hospitalization  no Has patient had a PCN reaction occurring within the last 10 years: no If all of the above answers are "NO", then may proceed with Cephalosporin use.     REVIEW OF SYSTEMS:   ROS As per history of present illness. All pertinent systems were reviewed above. Constitutional, HEENT, cardiovascular, respiratory, GI, GU, musculoskeletal, neuro, psychiatric, endocrine, integumentary and hematologic systems were reviewed and are otherwise negative/unremarkable except for positive findings mentioned above in the HPI.   MEDICATIONS AT HOME:   Prior to Admission medications   Medication Sig Start Date End Date Taking? Authorizing Provider  albuterol (PROVENTIL HFA;VENTOLIN HFA) 108 (90 Base) MCG/ACT inhaler Inhale 2-4 puffs by mouth every 4 hours as needed for wheezing, cough, and/or shortness of breath 01/01/17  Yes Loleta Rose, MD  Ascorbic Acid (VITAMIN C) 1000 MG tablet Take 1,000 mg by mouth daily.   Yes [provider]  aspirin EC 81 MG EC tablet Take 1 tablet (81 mg total) by mouth daily. 09/12/18  Yes Houston Siren, MD  atorvastatin (LIPITOR) 80 MG tablet Take 80 mg by mouth at bedtime. 06/30/22  Yes [provider]  Cholecalciferol 25 MCG (1000 UT) tablet Take 1,000 Units by mouth daily.   Yes [provider]  diltiazem (CARDIZEM CD) 300 MG 24 hr capsule Take 1 capsule (300 mg total) by mouth at bedtime. 02/08/20  Yes Dhungel, Nishant, MD  DULoxetine (CYMBALTA) 60 MG capsule Take 60 mg by mouth every morning. 09/07/22  Yes [provider]  fluticasone (FLONASE) 50 MCG/ACT nasal spray Place 1 spray into both nostrils daily as needed for allergies. 08/27/22  Yes [provider]  fluticasone furoate-vilanterol (BREO ELLIPTA) 100-25 MCG/INH AEPB Inhale 1 puff into the lungs daily. 06/14/17  Yes Sudini, Wardell Heath, MD  ipratropium-albuterol (DUONEB) 0.5-2.5 (3) MG/3ML SOLN Take 3  mLs by nebulization every 6 (six) hours as needed. 02/08/20  Yes Dhungel, Nishant, MD  lisinopril (ZESTRIL) 20 MG tablet Take 20 mg by mouth daily. 06/30/22  Yes [provider]  montelukast (SINGULAIR) 10 MG tablet Take 10 mg by mouth daily. 07/31/22  Yes [provider]  Multiple Vitamin (MULTI-VITAMIN DAILY PO) Take 1 tablet by mouth daily.   Yes [provider]  omeprazole (PRILOSEC) 20 MG capsule Take 20 mg by mouth daily.   Yes [provider]  oxybutynin (DITROPAN XL) 15 MG 24 hr tablet Take 15 mg by mouth daily. 02/03/20  Yes [provider]  potassium chloride SA (K-DUR,KLOR-CON) 20 MEQ tablet Take 1 tablet (20 mEq total) by mouth daily. 06/15/17  Yes Sudini, Wardell Heath, MD  umeclidinium bromide (INCRUSE ELLIPTA) 62.5 MCG/INH AEPB Inhale 1 puff into the lungs daily.   Yes [provider]  vitamin  B-12 (CYANOCOBALAMIN) 1000 MCG tablet Take 1,000 mcg by mouth daily.    Yes [provider]  famotidine (PEPCID) 20 MG tablet Take 20 mg by mouth daily. Patient not taking: Reported on 05/04/2023 06/30/22   [provider]  guaiFENesin (MUCINEX) 600 MG 12 hr tablet Take 1 tablet (600 mg total) by mouth 2 (two) times daily. Patient not taking: Reported on 05/04/2023 02/08/20   Dhungel, Theda Belfast, MD      VITAL SIGNS:  Blood pressure (!) 159/77, pulse (!) 110, temperature 97.7 F (36.5 C), temperature source Oral, resp. rate 16, height 5\' 4"  (1.626 m), weight 88.2 kg, SpO2 97%.  PHYSICAL EXAMINATION:  Physical Exam  GENERAL:  72 y.o.-year-old patient lying in the bed with no acute distress.  EYES: Pupils equal, round, reactive to light and accommodation. No scleral icterus. Extraocular muscles intact.  HEENT: Head atraumatic, normocephalic. Oropharynx and nasopharynx clear.  NECK:  Supple, no jugular venous distention. No thyroid enlargement, no tenderness.  LUNGS: Diffuse expiratory wheezes with tight expiratory airflow and harsh  vesicular breathing. No use of accessory muscles of respiration.  CARDIOVASCULAR: Regular rate and rhythm, S1, S2 normal. No murmurs, rubs, or gallops.  ABDOMEN: Soft, nondistended, nontender. Bowel sounds present. No organomegaly or mass.  EXTREMITIES: No pedal edema, cyanosis, or clubbing.  NEUROLOGIC: Cranial nerves II through XII are intact. Muscle strength 5/5 in all extremities. Sensation intact. Gait not checked.  PSYCHIATRIC: The patient is alert and oriented x 3.  Normal affect and good eye contact. SKIN: No obvious rash, lesion, or ulcer.   LABORATORY PANEL:   CBC Recent Labs  Lab 05/05/23 0104  WBC 8.2  HGB 12.2  HCT 37.9  PLT 239   ------------------------------------------------------------------------------------------------------------------  Chemistries  Recent Labs  Lab 05/04/23 2058 05/05/23 0104  NA  --  141  K  --  3.8  CL  --  104  CO2  --  28  GLUCOSE  --  241*  BUN  --  16  CREATININE  --  1.10*  CALCIUM  --  9.2  AST 33  --   ALT 32  --   ALKPHOS 54  --   BILITOT 0.5  --    ------------------------------------------------------------------------------------------------------------------  Cardiac Enzymes No results for input(s): "TROPONINI" in the last 168 hours. ------------------------------------------------------------------------------------------------------------------  RADIOLOGY:  DG Chest 2 View  Result Date: 05/04/2023 CLINICAL DATA:  COPD. EXAM: CHEST - 2 VIEW COMPARISON:  November 15, 2020 FINDINGS: The heart size and mediastinal contours are within normal limits. There is marked severity calcification of the thoracic aorta. There is evidence of emphysematous lung disease with mild, chronic mid right lung and bibasilar scarring and/or atelectasis. Mild, chronic right-sided volume loss is also seen. There is no evidence of acute infiltrate, pleural effusion or pneumothorax. Multilevel degenerative changes seen throughout the thoracic  spine. IMPRESSION: Emphysematous lung disease with mild, chronic mid right lung and bibasilar scarring and/or atelectasis. Electronically Signed   By: Aram Candela M.D.   On: 05/04/2023 20:22      IMPRESSION AND PLAN:  Assessment and Plan: * COPD exacerbation (HCC) - This associated with asthma exacerbation. - The patient will be admitted to a medically monitored bed. - We will place the patient IV steroid therapy with IV Solu-Medrol as well as nebulized bronchodilator therapy with duonebs q.i.d. and q.4 hours p.r.n.Marland Kitchen - Mucolytic therapy will be provided with Mucinex and antibiotic therapy with IV Rocephin. - O2 protocol will be followed. - We will hold off Breo and  continue Singulair.   Acute on chronic respiratory failure with hypoxia (HCC) - This is currently improving. - O2 protocol will be followed.  Essential hypertension - We will continue antihypertensive therapy.  Dyslipidemia - We will continue statin therapy.  GERD without esophagitis - Will continue PPI therapy.       DVT prophylaxis: Lovenox. Advanced Care Planning:  Code Status: The is DNR and DNI.  This was discussed with her.  Family Communication:  The plan of care was discussed in details with the patient (and family). I answered all questions. The patient agreed to proceed with the above mentioned plan. Further management will depend upon hospital course. Disposition Plan: Back to previous home environment Consults called: none. All the records are reviewed and case discussed with ED provider.  Status is: Inpatient    At the time of the admission, it appears that the appropriate admission status for this patient is inpatient.  This is judged to be reasonable and necessary in order to provide the required intensity of service to ensure the patient's safety given the presenting symptoms, physical exam findings and initial radiographic and laboratory data in the context of comorbid conditions.  The  patient requires inpatient status due to high intensity of service, high risk of further deterioration and high frequency of surveillance required.  I certify that at the time of admission, it is my clinical judgment that the patient will require inpatient hospital care extending more than 2 midnights.                            Dispo: The patient is from: Home              Anticipated d/c is to: Home              Patient currently is not medically stable to d/c.              Difficult to place patient: No  Hannah Beat M.D on 05/05/2023 at 4:20 AM  [facility]   From 7 PM-7 AM, contact night-coverage [email or web address]  CC: Primary care physician; Hamrick, Durward Fortes, MD

## 2023-05-05 NOTE — Assessment & Plan Note (Signed)
-   We will continue statin therapy. 

## 2023-05-05 NOTE — Plan of Care (Signed)

## 2023-05-06 DIAGNOSIS — J441 Chronic obstructive pulmonary disease with (acute) exacerbation: Secondary | ICD-10-CM | POA: Diagnosis not present

## 2023-05-06 LAB — BASIC METABOLIC PANEL
Anion gap: 8 (ref 5–15)
BUN: 22 mg/dL (ref 8–23)
CO2: 28 mmol/L (ref 22–32)
Calcium: 9 mg/dL (ref 8.9–10.3)
Chloride: 105 mmol/L (ref 98–111)
Creatinine, Ser: 1.01 mg/dL — ABNORMAL HIGH (ref 0.44–1.00)
GFR, Estimated: 59 mL/min — ABNORMAL LOW (ref >60)
Glucose, Bld: 143 mg/dL — ABNORMAL HIGH (ref 70–99)
Potassium: 3.9 mmol/L (ref 3.5–5.1)
Sodium: 141 mmol/L (ref 135–145)

## 2023-05-06 MED ORDER — BUDESONIDE 0.5 MG/2ML IN SUSP
0.5000 mg | Freq: Two times a day (BID) | RESPIRATORY_TRACT | Status: DC
  Administered 2023-05-06 – 2023-05-09 (×7): 0.5 mg via RESPIRATORY_TRACT
  Filled 2023-05-06 (×7): qty 2

## 2023-05-06 NOTE — Plan of Care (Signed)

## 2023-05-06 NOTE — Plan of Care (Signed)
  Problem: Education: Goal: Knowledge of disease or condition will improve Outcome: Progressing   Problem: Activity: Goal: Ability to tolerate increased activity will improve Outcome: Progressing   Problem: Respiratory: Goal: Ability to maintain a clear airway will improve Outcome: Not Progressing Goal: Levels of oxygenation will improve Outcome: Progressing Goal: Ability to maintain adequate ventilation will improve Outcome: Progressing

## 2023-05-06 NOTE — Progress Notes (Signed)
PROGRESS NOTE    Audrey Sexton   WGN:[id number] DOB: 03/11/1951  DOA: 05/04/2023 Date of Service: 05/06/23 PCP: Ailene Ravel, MD     Brief Narrative / Hospital Course:  Audrey Sexton is a 72 y.o. female with medical history significant for asthma, COPD on 3L O2, hypertension, dyslipidemia, and CVA, who presented to the emergency room with acute onset of worsening dyspnea with associated cough productive of yellowish sputum as well as wheezing x4 days.  09/07: to ED, HR 114 (EKG sinus tach), RR 24, SpO2 94% on 3L O2 [place] but occasionally requiring 4L. CXR (+)emphysematous lung disease with mild chronic mid right lung and bibasal scarring and/or atelectasis. Tx nebs w/ EMS/ED and IV steroid x1 w/ EMS, admitted to hospitalist service for COPD exacerbation w/ (+)SIRS criteria.  09/08: significant O2 desat on ambulation. No discharge today    Consultants:  none  Procedures: none      ASSESSMENT & PLAN:   Principal Problem:   COPD exacerbation (HCC) Active Problems:   Acute on chronic respiratory failure with hypoxia (HCC)   Essential hypertension   Dyslipidemia   GERD without esophagitis  COPD/Asthma exacerbation+ Acute on chronic hypoxic respiratory failure  Bronchodilators - MA+SABA DuoNeb q4h scheduled + additional prn IV steroids transitioned to po  significant wheezing, ICS w/ budesonide Mucolytics  Cough suppressant as needed w/ Tussionex IV ceftriaxone for now Continue home montelukast  Supplemental O2 Hold home ICS-LABA Breo for now  Hold home LAMA Incruse  Consider viral panel   Hx HFpEF - Echo 2021 LVEF 65-70%, G1DD BNP here normal Monitor fluid status, no diuretics needed at this time  D/c IV fluids  Allergies  Continue home Flonase   Essential HTN Continue home lisinopril, diltiazem,   Hx CVA ASA + statin   HLD Statin  GERD w/o esophagitis PPI + H2B  Anxiety/Depression Continue home Cymbalta  Overactive Bladder  Continue home  oxybutinin     Body mass index is 33.38 kg/m.    DVT prophylaxis: lovenox  IV fluids: no continuous IV fluids  Nutrition: cardiac diet  Central lines / invasive devices: none  Code Status: DNR ACP documentation reviewed: 05/05/23 has living will on file in [facility]  Current Admission Status: inpatient   LOS: 1 TOC needs: none at this time  Barriers to discharge / significant pending items: COPD exacerbation, await clinical improvement expect will be here another 1-2 days              Subjective / Brief ROS:  Patient reports still significant SOB especially on exertion Denies CP but feels a bit tight, about same as yesterday  Pain controlled.  Denies new weakness.  Tolerating diet.  Reports no concerns w/ urination/defecation.   Family Communication: none at this time    Objective Findings:  Vitals:   05/05/23 2057 05/06/23 0041 05/06/23 0344 05/06/23 0840  BP: (!) 156/79 (!) 172/79 (!) 144/66 132/74  Pulse: (!) 109 (!) 102 92 (!) 105  Resp: 20 20 20 20   Temp: 97.6 F (36.4 C) 98 F (36.7 C) 98.2 F (36.8 C) 98 F (36.7 C)  TempSrc: Oral Oral Oral   SpO2: 98% 96% 95% 94%  Weight:      Height:        Intake/Output Summary (Last 24 hours) at 05/06/2023 1318 Last data filed at 05/06/2023 1023 Gross per 24 hour  Intake 1040 ml  Output --  Net 1040 ml   Filed Weights   05/04/23 1850  05/05/23 0006  Weight: 83.5 kg 88.2 kg    Examination:  Physical Exam Constitutional:      General: She is not in acute distress. Cardiovascular:     Rate and Rhythm: Normal rate and regular rhythm.  Pulmonary:     Effort: Pulmonary effort is normal. No accessory muscle usage.     Breath sounds: Wheezing present.  Neurological:     General: No focal deficit present.     Mental Status: She is alert and oriented to person, place, and time.  Psychiatric:        Mood and Affect: Mood normal.        Behavior: Behavior normal.          Scheduled Medications:    aspirin EC  81 mg Oral Daily   atorvastatin  80 mg Oral Daily   budesonide (PULMICORT) nebulizer solution  0.5 mg Nebulization BID   diltiazem  300 mg Oral QHS   DULoxetine  60 mg Oral q morning   enoxaparin (LOVENOX) injection  40 mg Subcutaneous Q24H   guaiFENesin  600 mg Oral BID   ipratropium-albuterol  3 mL Nebulization QID   lisinopril  20 mg Oral Daily   montelukast  10 mg Oral Daily   multivitamin with minerals   Oral Daily   oxybutynin  15 mg Oral Daily   pantoprazole  40 mg Oral Daily   predniSONE  50 mg Oral Q breakfast    Continuous Infusions:  cefTRIAXone (ROCEPHIN)  IV 1 g (05/05/23 2339)    PRN Medications:  acetaminophen **OR** acetaminophen, chlorpheniramine-HYDROcodone, fluticasone, ipratropium-albuterol, magnesium hydroxide, ondansetron **OR** ondansetron (ZOFRAN) IV, traZODone  Antimicrobials from admission:  Anti-infectives (From admission, onward)    Start     Dose/Rate Route Frequency Ordered Stop   05/04/23 2345  cefTRIAXone (ROCEPHIN) 1 g in sodium chloride 0.9 % 100 mL IVPB        1 g 200 mL/hr over 30 Minutes Intravenous Every 24 hours 05/04/23 2341 05/09/23 2344           Data Reviewed:  I have personally reviewed the following...  CBC: Recent Labs  Lab 05/04/23 1859 05/05/23 0104  WBC 9.4 8.2  HGB 12.0 12.2  HCT 37.7 37.9  MCV 91.3 89.8  PLT 246 239   Basic Metabolic Panel: Recent Labs  Lab 05/04/23 1859 05/05/23 0104 05/06/23 0453  NA 142 141 141  K 3.6 3.8 3.9  CL 104 104 105  CO2 29 28 28   GLUCOSE 121* 241* 143*  BUN 18 16 22   CREATININE 1.05* 1.10* 1.01*  CALCIUM 9.3 9.2 9.0   GFR: Estimated Creatinine Clearance: 53.3 mL/min (A) (by C-G formula based on SCr of 1.01 mg/dL (H)). Liver Function Tests: Recent Labs  Lab 05/04/23 2058  AST 33  ALT 32  ALKPHOS 54  BILITOT 0.5  PROT 6.9  ALBUMIN 3.6   No results for input(s): "LIPASE", "AMYLASE" in the last 168 hours. No results for input(s): "AMMONIA" in the  last 168 hours. Coagulation Profile: No results for input(s): "INR", "PROTIME" in the last 168 hours. Cardiac Enzymes: No results for input(s): "CKTOTAL", "CKMB", "CKMBINDEX", "TROPONINI" in the last 168 hours. BNP (last 3 results) No results for input(s): "PROBNP" in the last 8760 hours. HbA1C: No results for input(s): "HGBA1C" in the last 72 hours. CBG: No results for input(s): "GLUCAP" in the last 168 hours. Lipid Profile: No results for input(s): "CHOL", "HDL", "LDLCALC", "TRIG", "CHOLHDL", "LDLDIRECT" in the last 72 hours. Thyroid Function  Tests: No results for input(s): "TSH", "T4TOTAL", "FREET4", "T3FREE", "THYROIDAB" in the last 72 hours. Anemia Panel: No results for input(s): "VITAMINB12", "FOLATE", "FERRITIN", "TIBC", "IRON", "RETICCTPCT" in the last 72 hours. Most Recent Urinalysis On File:     Component Value Date/Time   COLORURINE AMBER (A) 09/14/2022 2023   APPEARANCEUR CLOUDY (A) 09/14/2022 2023   APPEARANCEUR Clear 06/16/2014 2108   LABSPEC 1.025 09/14/2022 2023   LABSPEC 1.003 06/16/2014 2108   PHURINE 5.0 09/14/2022 2023   GLUCOSEU NEGATIVE 09/14/2022 2023   GLUCOSEU Negative 06/16/2014 2108   HGBUR NEGATIVE 09/14/2022 2023   BILIRUBINUR NEGATIVE 09/14/2022 2023   BILIRUBINUR Negative 06/16/2014 2108   KETONESUR 5 (A) 09/14/2022 2023   PROTEINUR 100 (A) 09/14/2022 2023   NITRITE NEGATIVE 09/14/2022 2023   LEUKOCYTESUR TRACE (A) 09/14/2022 2023   LEUKOCYTESUR Negative 06/16/2014 2108   Sepsis Labs: @LABRCNTIP (procalcitonin:4,lacticidven:4) Microbiology: No results found for this or any previous visit (from the past 240 hour(s)).    Radiology Studies last 3 days: DG Chest 2 View  Result Date: 05/04/2023 CLINICAL DATA:  COPD. EXAM: CHEST - 2 VIEW COMPARISON:  November 15, 2020 FINDINGS: The heart size and mediastinal contours are within normal limits. There is marked severity calcification of the thoracic aorta. There is evidence of emphysematous lung disease  with mild, chronic mid right lung and bibasilar scarring and/or atelectasis. Mild, chronic right-sided volume loss is also seen. There is no evidence of acute infiltrate, pleural effusion or pneumothorax. Multilevel degenerative changes seen throughout the thoracic spine. IMPRESSION: Emphysematous lung disease with mild, chronic mid right lung and bibasilar scarring and/or atelectasis. Electronically Signed   By: Aram Candela M.D.   On: 05/04/2023 20:22             LOS: 2 days       Sunnie Nielsen, DO [facility] 05/06/2023, 1:18 PM    Dictation software may have been used to generate the above note. Typos may occur and escape review in typed/dictated notes. Please contact Dr Lyn Hollingshead directly for clarity if needed.  Staff may message me via secure chat in Epic  but this may not receive an immediate response,  please page me for urgent matters!  If 7PM-7AM, please contact night coverage [email or web address]

## 2023-05-06 NOTE — Progress Notes (Signed)
Mobility Specialist - Progress Note   05/06/23 1002  Mobility  Activity Ambulated with assistance in hallway  Level of Assistance Contact guard assist, steadying assist  Assistive Device None  Distance Ambulated (ft) 100 ft  Activity Response Tolerated well  Mobility Referral Yes  $Mobility charge 1 Mobility  Mobility Specialist Start Time (ACUTE ONLY) 0941  Mobility Specialist Stop Time (ACUTE ONLY) 1000  Mobility Specialist Time Calculation (min) (ACUTE ONLY) 19 min   Pt sitting EOB on 3L upon arrival. Pt STS and ambulates in hallway CGA with no LOB but generally unsteady. Pt takes 2 standing rest breaks throughout ambulation and defers AD this session. Pt desats to 78 60 feet into ambulation, RN notified. Pt left EOB with needs in reach and bed alarm set.   Terrilyn Saver  Mobility Specialist  05/06/23 10:29 AM

## 2023-05-07 DIAGNOSIS — J441 Chronic obstructive pulmonary disease with (acute) exacerbation: Secondary | ICD-10-CM | POA: Diagnosis not present

## 2023-05-07 NOTE — Progress Notes (Signed)
Mobility Specialist - Progress Note   05/07/23 1047  Mobility  Activity Ambulated with assistance in hallway;Stood at bedside;Transferred to/from BSC;Dangled on edge of bed  Level of Assistance Contact guard assist, steadying assist  Assistive Device None  Distance Ambulated (ft) 100 ft  Activity Response Tolerated well  Mobility Referral Yes  $Mobility charge 1 Mobility  Mobility Specialist Start Time (ACUTE ONLY) 0931  Mobility Specialist Stop Time (ACUTE ONLY) 0958  Mobility Specialist Time Calculation (min) (ACUTE ONLY) 27 min   Pt supine in bed on 3L upon arrival. Pt completes bed mobility HHA. Pt STS and transfers to [facility] CGA and than able to STS SBA and ambulate in hallway CGA. Pt desats to 84 during ambulation, RN notified. Pt left in bed with needs in reach and bed alarm activated.   Terrilyn Saver  Mobility Specialist  05/07/23 10:52 AM

## 2023-05-07 NOTE — Plan of Care (Signed)

## 2023-05-07 NOTE — Progress Notes (Signed)
PROGRESS NOTE    Audrey Sexton   [id number]:[id number] DOB: Jan 29, 1951  DOA: 05/04/2023 Date of Service: 05/07/23 PCP: Ailene Ravel, MD     Brief Narrative / Hospital Course:  Audrey Sexton is a 72 y.o. female with medical history significant for asthma, COPD on 3L O2, hypertension, dyslipidemia, and CVA, who presented to the emergency room with acute onset of worsening dyspnea with associated cough productive of yellowish sputum as well as wheezing x4 days.  09/07: to ED, HR 114 (EKG sinus tach), RR 24, SpO2 94% on 3L O2 [place] but occasionally requiring 4L. CXR (+)emphysematous lung disease with mild chronic mid right lung and bibasal scarring and/or atelectasis. Tx nebs w/ EMS/ED and IV steroid x1 w/ EMS, admitted to hospitalist service for COPD exacerbation w/ (+)SIRS criteria.  09/08-09/09: significant O2 desat on ambulation. No discharge for now     Consultants:  none  Procedures: none      ASSESSMENT & PLAN:   Principal Problem:   COPD exacerbation (HCC) Active Problems:   Acute on chronic respiratory failure with hypoxia (HCC)   Essential hypertension   Dyslipidemia   COPD (chronic obstructive pulmonary disease) (HCC)   HTN (hypertension)   HLD (hyperlipidemia)   COPD with acute exacerbation (HCC)   Chronic respiratory failure with hypoxia (HCC)   GERD without esophagitis  COPD/Asthma exacerbation+ Acute on chronic hypoxic respiratory failure  Bronchodilators - MA+SABA DuoNeb q4h scheduled + additional prn IV steroids transitioned to po  significant wheezing, ICS w/ budesonide Mucolytics  Cough suppressant as needed w/ Tussionex IV ceftriaxone for now - will stay on abx given severity of COPD  Continue home montelukast  Supplemental O2 Hold home ICS-LABA Breo for now  Hold home LAMA Incruse   Hx HFpEF - Echo 2021 LVEF 65-70%, G1DD BNP here normal Monitor fluid status, no diuretics needed at this time  D/c IV fluids  Allergies  Continue home Flonase    Essential HTN Continue home lisinopril, diltiazem,   Hx CVA ASA + statin   HLD Statin  GERD w/o esophagitis PPI + H2B  Anxiety/Depression Continue home Cymbalta  Overactive Bladder  Continue home oxybutinin     Body mass index is 33.38 kg/m.    DVT prophylaxis: lovenox  IV fluids: no continuous IV fluids  Nutrition: cardiac diet  Central lines / invasive devices: none  Code Status: DNR ACP documentation reviewed: 05/05/23 has living will on file in [facility]  Current Admission Status: inpatient   LOS: 3 TOC needs: none at this time  Barriers to discharge / significant pending items: COPD exacerbation, await clinical improvement              Subjective / Brief ROS:  Patient reports still significant SOB especially on exertion Denies new weakness.  Tolerating diet.  Reports no concerns w/ urination/defecation.   Family Communication: none at this time    Objective Findings:  Vitals:   05/07/23 0018 05/07/23 0436 05/07/23 0756 05/07/23 1118  BP: (!) 152/88 (!) 143/87 134/67 118/62  Pulse: (!) 102 85 79 88  Resp: 20 20 16    Temp: 98.2 F (36.8 C) (!) 97.5 F (36.4 C)  98.6 F (37 C)  TempSrc: Oral   Oral  SpO2: 92% 96% 100%   Weight:      Height:        Intake/Output Summary (Last 24 hours) at 05/07/2023 1217 Last data filed at 05/07/2023 1028 Gross per 24 hour  Intake 460 ml  Output --  Net 460 ml   Filed Weights   05/04/23 1850 05/05/23 0006  Weight: 83.5 kg 88.2 kg    Examination:  Physical Exam Constitutional:      General: She is not in acute distress. Cardiovascular:     Rate and Rhythm: Normal rate and regular rhythm.  Pulmonary:     Effort: Pulmonary effort is normal. No accessory muscle usage.     Breath sounds: Wheezing (somewhat improved from yesterday) present.  Neurological:     General: No focal deficit present.     Mental Status: She is alert and oriented to person, place, and time.  Psychiatric:        Mood  and Affect: Mood normal.        Behavior: Behavior normal.          Scheduled Medications:   aspirin EC  81 mg Oral Daily   atorvastatin  80 mg Oral Daily   budesonide (PULMICORT) nebulizer solution  0.5 mg Nebulization BID   diltiazem  300 mg Oral QHS   DULoxetine  60 mg Oral q morning   enoxaparin (LOVENOX) injection  40 mg Subcutaneous Q24H   guaiFENesin  600 mg Oral BID   ipratropium-albuterol  3 mL Nebulization QID   lisinopril  20 mg Oral Daily   montelukast  10 mg Oral Daily   multivitamin with minerals   Oral Daily   oxybutynin  15 mg Oral Daily   pantoprazole  40 mg Oral Daily   predniSONE  50 mg Oral Q breakfast    Continuous Infusions:  cefTRIAXone (ROCEPHIN)  IV Stopped (05/07/23 0053)    PRN Medications:  acetaminophen **OR** acetaminophen, chlorpheniramine-HYDROcodone, fluticasone, ipratropium-albuterol, magnesium hydroxide, ondansetron **OR** ondansetron (ZOFRAN) IV, traZODone  Antimicrobials from admission:  Anti-infectives (From admission, onward)    Start     Dose/Rate Route Frequency Ordered Stop   05/04/23 2345  cefTRIAXone (ROCEPHIN) 1 g in sodium chloride 0.9 % 100 mL IVPB        1 g 200 mL/hr over 30 Minutes Intravenous Every 24 hours 05/04/23 2341 05/09/23 2344           Data Reviewed:  I have personally reviewed the following...  CBC: Recent Labs  Lab 05/04/23 1859 05/05/23 0104  WBC 9.4 8.2  HGB 12.0 12.2  HCT 37.7 37.9  MCV 91.3 89.8  PLT 246 239   Basic Metabolic Panel: Recent Labs  Lab 05/04/23 1859 05/05/23 0104 05/06/23 0453  NA 142 141 141  K 3.6 3.8 3.9  CL 104 104 105  CO2 29 28 28   GLUCOSE 121* 241* 143*  BUN 18 16 22   CREATININE 1.05* 1.10* 1.01*  CALCIUM 9.3 9.2 9.0   GFR: Estimated Creatinine Clearance: 53.3 mL/min (A) (by C-G formula based on SCr of 1.01 mg/dL (H)). Liver Function Tests: Recent Labs  Lab 05/04/23 2058  AST 33  ALT 32  ALKPHOS 54  BILITOT 0.5  PROT 6.9  ALBUMIN 3.6   No  results for input(s): "LIPASE", "AMYLASE" in the last 168 hours. No results for input(s): "AMMONIA" in the last 168 hours. Coagulation Profile: No results for input(s): "INR", "PROTIME" in the last 168 hours. Cardiac Enzymes: No results for input(s): "CKTOTAL", "CKMB", "CKMBINDEX", "TROPONINI" in the last 168 hours. BNP (last 3 results) No results for input(s): "PROBNP" in the last 8760 hours. HbA1C: No results for input(s): "HGBA1C" in the last 72 hours. CBG: No results for input(s): "GLUCAP" in the last 168 hours. Lipid Profile: No results for  input(s): "CHOL", "HDL", "LDLCALC", "TRIG", "CHOLHDL", "LDLDIRECT" in the last 72 hours. Thyroid Function Tests: No results for input(s): "TSH", "T4TOTAL", "FREET4", "T3FREE", "THYROIDAB" in the last 72 hours. Anemia Panel: No results for input(s): "VITAMINB12", "FOLATE", "FERRITIN", "TIBC", "IRON", "RETICCTPCT" in the last 72 hours. Most Recent Urinalysis On File:     Component Value Date/Time   COLORURINE AMBER (A) 09/14/2022 2023   APPEARANCEUR CLOUDY (A) 09/14/2022 2023   APPEARANCEUR Clear 06/16/2014 2108   LABSPEC 1.025 09/14/2022 2023   LABSPEC 1.003 06/16/2014 2108   PHURINE 5.0 09/14/2022 2023   GLUCOSEU NEGATIVE 09/14/2022 2023   GLUCOSEU Negative 06/16/2014 2108   HGBUR NEGATIVE 09/14/2022 2023   BILIRUBINUR NEGATIVE 09/14/2022 2023   BILIRUBINUR Negative 06/16/2014 2108   KETONESUR 5 (A) 09/14/2022 2023   PROTEINUR 100 (A) 09/14/2022 2023   NITRITE NEGATIVE 09/14/2022 2023   LEUKOCYTESUR TRACE (A) 09/14/2022 2023   LEUKOCYTESUR Negative 06/16/2014 2108   Sepsis Labs: @LABRCNTIP (procalcitonin:4,lacticidven:4) Microbiology: No results found for this or any previous visit (from the past 240 hour(s)).    Radiology Studies last 3 days: DG Chest 2 View  Result Date: 05/04/2023 CLINICAL DATA:  COPD. EXAM: CHEST - 2 VIEW COMPARISON:  November 15, 2020 FINDINGS: The heart size and mediastinal contours are within normal limits.  There is marked severity calcification of the thoracic aorta. There is evidence of emphysematous lung disease with mild, chronic mid right lung and bibasilar scarring and/or atelectasis. Mild, chronic right-sided volume loss is also seen. There is no evidence of acute infiltrate, pleural effusion or pneumothorax. Multilevel degenerative changes seen throughout the thoracic spine. IMPRESSION: Emphysematous lung disease with mild, chronic mid right lung and bibasilar scarring and/or atelectasis. Electronically Signed   By: Aram Candela M.D.   On: 05/04/2023 20:22             LOS: 3 days       Sunnie Nielsen, DO [facility] 05/07/2023, 12:17 PM    Dictation software may have been used to generate the above note. Typos may occur and escape review in typed/dictated notes. Please contact Dr Lyn Hollingshead directly for clarity if needed.  Staff may message me via secure chat in Epic  but this may not receive an immediate response,  please page me for urgent matters!  If 7PM-7AM, please contact night coverage [email or web address]

## 2023-05-07 NOTE — Care Management Important Message (Signed)
Important Message  Patient Details  Name: AHAVAH RIQUELME MRN: [id number] Date of Birth: 1951-05-21   Medicare Important Message Given:  N/A - LOS <3 / Initial given by admissions     [name] A Faust Thorington 05/07/2023, 8:25 AM

## 2023-05-08 DIAGNOSIS — J441 Chronic obstructive pulmonary disease with (acute) exacerbation: Secondary | ICD-10-CM | POA: Diagnosis not present

## 2023-05-08 NOTE — Care Management Important Message (Signed)
Important Message  Patient Details  Name: Audrey Sexton MRN: [id number] Date of Birth: 09-24-1950   Medicare Important Message Given:  Yes     [name] A [name] 05/08/2023, 2:28 PM

## 2023-05-08 NOTE — Progress Notes (Signed)
PROGRESS NOTE    SHAIEL PELZ   [id number] DOB: [birth year]  DOA: 05/04/2023 Date of Service: 05/08/23 PCP: Ailene Ravel, MD     Brief Narrative / Hospital Course:  HELEM KWASNIK is a 72 y.o. female with medical history significant for asthma, COPD on 3L O2, hypertension, dyslipidemia, and CVA, who presented to the emergency room with acute onset of worsening dyspnea with associated cough productive of yellowish sputum as well as wheezing x4 days.  09/07: to ED, HR 114 (EKG sinus tach), RR 24, SpO2 94% on 3L O2 [place] but occasionally requiring 4L. CXR (+)emphysematous lung disease with mild chronic mid right lung and bibasal scarring and/or atelectasis. Tx nebs w/ EMS/ED and IV steroid x1 w/ EMS, admitted to hospitalist service for COPD exacerbation w/ (+)SIRS criteria.  09/08-09/09: significant O2 desat on ambulation. No discharge for now  09/10: lung sounds less wheezing, still desat and SOB on ambulation, possible for d/c tomorrow if improving     Consultants:  none  Procedures: none      ASSESSMENT & PLAN:   Principal Problem:   COPD exacerbation (HCC) Active Problems:   Acute on chronic respiratory failure with hypoxia (HCC)   Essential hypertension   Dyslipidemia   COPD (chronic obstructive pulmonary disease) (HCC)   HTN (hypertension)   HLD (hyperlipidemia)   COPD with acute exacerbation (HCC)   Chronic respiratory failure with hypoxia (HCC)   GERD without esophagitis  COPD/Asthma exacerbation+ Acute on chronic hypoxic respiratory failure  Bronchodilators - DuoNeb q4h scheduled + additional prn IV steroids transitioned to po  significant wheezing, ICS w/ budesonide Mucolytics  Cough suppressant as needed w/ Tussionex IV ceftriaxone for now - will stay on abx given severity of COPD  Continue home montelukast  Supplemental O2 Hold home ICS-LABA Breo for now  Hold home LAMA Incruse   Hx HFpEF - Echo 2021 LVEF 65-70%, G1DD BNP here  normal Monitor fluid status, no diuretics needed at this time  D/c IV fluids  Allergies  Continue home Flonase   Essential HTN Continue home lisinopril, diltiazem,   Hx CVA ASA + statin   HLD Statin  GERD w/o esophagitis PPI + H2B  Anxiety/Depression Continue home Cymbalta  Overactive Bladder  Continue home oxybutinin     Body mass index is 33.38 kg/m.    DVT prophylaxis: lovenox  IV fluids: no continuous IV fluids  Nutrition: cardiac diet  Central lines / invasive devices: none  Code Status: DNR ACP documentation reviewed: 05/05/23 has living will on file in [facility]  Current Admission Status: inpatient   LOS: 3 TOC needs: none at this time  Barriers to discharge / significant pending items: COPD exacerbation, await clinical improvement, improving slowly, hopeful for d/c home tomorrow if ambulating well              Subjective / Brief ROS:  Patient reports still significant SOB especially on exertion but somewhat better than yesterday and day before  Denies new weakness.  Tolerating diet.  NO chest pain   Family Communication: none at this time    Objective Findings:  Vitals:   05/07/23 2058 05/08/23 0509 05/08/23 0749 05/08/23 0749  BP:  (!) 147/73  (!) 157/76  Pulse:  89  87  Resp:  18  17  Temp:  (!) 97.5 F (36.4 C)  97.9 F (36.6 C)  TempSrc:  Oral    SpO2: 96% 96%  96%  Weight:   86.4 kg   Height:  Intake/Output Summary (Last 24 hours) at 05/08/2023 1314 Last data filed at 05/08/2023 0749 Gross per 24 hour  Intake 680 ml  Output --  Net 680 ml   Filed Weights   05/04/23 1850 05/05/23 0006 05/08/23 0749  Weight: 83.5 kg 88.2 kg 86.4 kg    Examination:  Physical Exam Constitutional:      General: She is not in acute distress. Cardiovascular:     Rate and Rhythm: Normal rate and regular rhythm.  Pulmonary:     Effort: Pulmonary effort is normal. No accessory muscle usage.     Breath sounds: Wheezing (furhter  improved from yesterday - scattered all fields) present.  Neurological:     General: No focal deficit present.     Mental Status: She is alert and oriented to person, place, and time.  Psychiatric:        Mood and Affect: Mood normal.        Behavior: Behavior normal.          Scheduled Medications:   aspirin EC  81 mg Oral Daily   atorvastatin  80 mg Oral Daily   budesonide (PULMICORT) nebulizer solution  0.5 mg Nebulization BID   diltiazem  300 mg Oral QHS   DULoxetine  60 mg Oral q morning   enoxaparin (LOVENOX) injection  40 mg Subcutaneous Q24H   guaiFENesin  600 mg Oral BID   ipratropium-albuterol  3 mL Nebulization QID   lisinopril  20 mg Oral Daily   montelukast  10 mg Oral Daily   multivitamin with minerals   Oral Daily   oxybutynin  15 mg Oral Daily   pantoprazole  40 mg Oral Daily   predniSONE  50 mg Oral Q breakfast    Continuous Infusions:  cefTRIAXone (ROCEPHIN)  IV Stopped (05/07/23 2355)    PRN Medications:  acetaminophen **OR** acetaminophen, chlorpheniramine-HYDROcodone, fluticasone, ipratropium-albuterol, magnesium hydroxide, ondansetron **OR** ondansetron (ZOFRAN) IV, traZODone  Antimicrobials from admission:  Anti-infectives (From admission, onward)    Start     Dose/Rate Route Frequency Ordered Stop   05/04/23 2345  cefTRIAXone (ROCEPHIN) 1 g in sodium chloride 0.9 % 100 mL IVPB        1 g 200 mL/hr over 30 Minutes Intravenous Every 24 hours 05/04/23 2341 05/09/23 2344           Data Reviewed:  I have personally reviewed the following...  CBC: Recent Labs  Lab 05/04/23 1859 05/05/23 0104  WBC 9.4 8.2  HGB 12.0 12.2  HCT 37.7 37.9  MCV 91.3 89.8  PLT 246 239   Basic Metabolic Panel: Recent Labs  Lab 05/04/23 1859 05/05/23 0104 05/06/23 0453  NA 142 141 141  K 3.6 3.8 3.9  CL 104 104 105  CO2 29 28 28   GLUCOSE 121* 241* 143*  BUN 18 16 22   CREATININE 1.05* 1.10* 1.01*  CALCIUM 9.3 9.2 9.0   GFR: Estimated  Creatinine Clearance: 52.8 mL/min (A) (by C-G formula based on SCr of 1.01 mg/dL (H)). Liver Function Tests: Recent Labs  Lab 05/04/23 2058  AST 33  ALT 32  ALKPHOS 54  BILITOT 0.5  PROT 6.9  ALBUMIN 3.6   No results for input(s): "LIPASE", "AMYLASE" in the last 168 hours. No results for input(s): "AMMONIA" in the last 168 hours. Coagulation Profile: No results for input(s): "INR", "PROTIME" in the last 168 hours. Cardiac Enzymes: No results for input(s): "CKTOTAL", "CKMB", "CKMBINDEX", "TROPONINI" in the last 168 hours. BNP (last 3 results) No results for  input(s): "PROBNP" in the last 8760 hours. HbA1C: No results for input(s): "HGBA1C" in the last 72 hours. CBG: No results for input(s): "GLUCAP" in the last 168 hours. Lipid Profile: No results for input(s): "CHOL", "HDL", "LDLCALC", "TRIG", "CHOLHDL", "LDLDIRECT" in the last 72 hours. Thyroid Function Tests: No results for input(s): "TSH", "T4TOTAL", "FREET4", "T3FREE", "THYROIDAB" in the last 72 hours. Anemia Panel: No results for input(s): "VITAMINB12", "FOLATE", "FERRITIN", "TIBC", "IRON", "RETICCTPCT" in the last 72 hours. Most Recent Urinalysis On File:     Component Value Date/Time   COLORURINE AMBER (A) 09/14/2022 2023   APPEARANCEUR CLOUDY (A) 09/14/2022 2023   APPEARANCEUR Clear 06/16/2014 2108   LABSPEC 1.025 09/14/2022 2023   LABSPEC 1.003 06/16/2014 2108   PHURINE 5.0 09/14/2022 2023   GLUCOSEU NEGATIVE 09/14/2022 2023   GLUCOSEU Negative 06/16/2014 2108   HGBUR NEGATIVE 09/14/2022 2023   BILIRUBINUR NEGATIVE 09/14/2022 2023   BILIRUBINUR Negative 06/16/2014 2108   KETONESUR 5 (A) 09/14/2022 2023   PROTEINUR 100 (A) 09/14/2022 2023   NITRITE NEGATIVE 09/14/2022 2023   LEUKOCYTESUR TRACE (A) 09/14/2022 2023   LEUKOCYTESUR Negative 06/16/2014 2108   Sepsis Labs: @LABRCNTIP (procalcitonin:4,lacticidven:4) Microbiology: No results found for this or any previous visit (from the past 240 hour(s)).     Radiology Studies last 3 days: DG Chest 2 View  Result Date: 05/04/2023 CLINICAL DATA:  COPD. EXAM: CHEST - 2 VIEW COMPARISON:  November 15, 2020 FINDINGS: The heart size and mediastinal contours are within normal limits. There is marked severity calcification of the thoracic aorta. There is evidence of emphysematous lung disease with mild, chronic mid right lung and bibasilar scarring and/or atelectasis. Mild, chronic right-sided volume loss is also seen. There is no evidence of acute infiltrate, pleural effusion or pneumothorax. Multilevel degenerative changes seen throughout the thoracic spine. IMPRESSION: Emphysematous lung disease with mild, chronic mid right lung and bibasilar scarring and/or atelectasis. Electronically Signed   By: Aram Candela M.D.   On: 05/04/2023 20:22             LOS: 4 days       Sunnie Nielsen, DO [facility] 05/08/2023, 1:14 PM    Dictation software may have been used to generate the above note. Typos may occur and escape review in typed/dictated notes. Please contact Dr Lyn Hollingshead directly for clarity if needed.  Staff may message me via secure chat in Epic  but this may not receive an immediate response,  please page me for urgent matters!  If 7PM-7AM, please contact night coverage [email or web address]

## 2023-05-08 NOTE — Progress Notes (Signed)
Mobility Specialist - Progress Note   Pre-mobility: HR 104 During mobility: HR 111 Post-mobility: HR 101   05/08/23 1146  Mobility  Activity Ambulated with assistance in hallway  Level of Assistance Contact guard assist, steadying assist  Assistive Device None  Distance Ambulated (ft) 80 ft  Activity Response Tolerated well  $Mobility charge 1 Mobility  Mobility Specialist Start Time (ACUTE ONLY) 1115  Mobility Specialist Stop Time (ACUTE ONLY) 1138  Mobility Specialist Time Calculation (min) (ACUTE ONLY) 23 min   Nurse requested Mobility Specialist to perform oxygen saturation test with pt which includes removing pt from oxygen both at rest and while ambulating.  Below are the results from that testing.     Patient Saturations on Room Air at Rest = spO2 83%  Patient Saturations on 2L of oxygen at rest= spO2 94%  Patient Saturations on 2 Liters of oxygen while Ambulating = sp02 91%  At end of testing pt left in room on 3  Liters of oxygen.  Reported results to nurse.   Pt sitting on the [facility] upon entry. Pt STS ModI and amb 80 ft in the hallway CGA, mild SOB present during and shortly after amb. During amb pt held on to railing and objects for support and stability, denied the use of a AD. Pt returned to the room, placed back on 3L O2, left supine with alarm set and needs within reach.  Zetta Bills Mobility Specialist 05/08/23 11:53 AM

## 2023-05-08 NOTE — Plan of Care (Signed)

## 2023-05-08 NOTE — TOC Initial Note (Signed)
Transition of Care [facility]) - Initial/Assessment Note    Patient Details  Name: Audrey Sexton MRN: [id number] Date of Birth: 31-Mar-1951  Transition of Care [facility]) CM/SW Contact:    Allena Katz, LCSW Phone Number: 05/08/2023, 10:30 AM  Clinical Narrative:   Pt admitted with COPD. Pt on chronic 3L of oxygen through adapt. Pt also has a nebulizer she uses. Pt desatting significantly with ambulation. Pt's daughter assists patient with appointments as patient does not drive. Pt is active with her PCP Dr. Lemar Livings.  At baseline pt walks with a walker and has a rollator.                Expected Discharge Plan: Home w Home Health Services Barriers to Discharge: Continued Medical Work up   Patient Goals and CMS Choice Patient states their goals for this hospitalization and ongoing recovery are:: return home CMS Medicare.gov Compare Post Acute Care list provided to:: Patient Choice offered to / list presented to : Patient      Expected Discharge Plan and Services                         DME Arranged: Oxygen DME Agency: [facility]                  Prior Living Arrangements/Services   Lives with:: Self Patient language and need for interpreter reviewed:: Yes            Current home services: DME    Activities of Daily Living Home Assistive Devices/Equipment: None ADL Screening (condition at time of admission) Patient's cognitive ability adequate to safely complete daily activities?: Yes Is the patient deaf or have difficulty hearing?: No Does the patient have difficulty seeing, even when wearing glasses/contacts?: No Does the patient have difficulty concentrating, remembering, or making decisions?: No Patient able to express need for assistance with ADLs?: Yes Does the patient have difficulty dressing or bathing?: Yes Independently performs ADLs?: Yes (appropriate for developmental age) Does the patient have difficulty walking or climbing stairs?:  No Weakness of Legs: Both Weakness of Arms/Hands: None  Permission Sought/Granted                  Emotional Assessment       Orientation: : Oriented to Self, Oriented to Place, Oriented to  Time, Oriented to Situation      Admission diagnosis:  COPD exacerbation (HCC) [J44.1] Patient Active Problem List   Diagnosis Date Noted   Acute on chronic respiratory failure with hypoxia (HCC) 05/05/2023   Dyslipidemia 05/05/2023   Essential hypertension 05/05/2023   GERD without esophagitis 05/05/2023   COPD exacerbation (HCC) 05/04/2023   Sepsis without acute organ dysfunction (HCC) 09/17/2022   Hematochezia 09/15/2022   Chronic respiratory failure with hypoxia (HCC) 09/15/2022   Leukocytosis 09/15/2022   Acute GI bleeding 09/15/2022   Acute on chronic respiratory failure with hypoxia and hypercapnia (HCC) 02/08/2020   Hypokalemia 02/08/2020   COPD with acute exacerbation (HCC) 02/08/2020   Chronic diastolic CHF (congestive heart failure) (HCC) 02/08/2020   Hypomagnesemia 02/08/2020   Sinus tachycardia 02/08/2020   Right leg weakness 09/06/2018   HTN (hypertension) 09/06/2018   HLD (hyperlipidemia) 09/06/2018   Stroke (HCC) 09/06/2018   Acute respiratory distress    DNR (do not resuscitate) discussion 08/23/2016   Palliative care by specialist 08/23/2016   Dyspnea 08/23/2016   COPD (chronic obstructive pulmonary disease) (HCC) 08/18/2016   PCP:  Ailene Ravel, MD Pharmacy:  [facility] [phone] - Closed - [place], [place] - [address] [place] [address] Phone: [phone] Fax: [phone]  [id number] - [place],  - [address] [place] [address] Phone: [phone] Fax: [phone]  [id number] - [place], [place] - [address] AT [facility] [address] [place] [address] Phone: [phone] Fax: [phone]     Social Determinants of Health (SDOH) Social History: SDOH  Screenings   Food Insecurity: No Food Insecurity (09/15/2022)  Housing: Low Risk  (09/15/2022)  Transportation Needs: Unmet Transportation Needs (09/15/2022)  Utilities: Not At Risk (09/15/2022)  Financial Resource Strain: Low Risk  (09/09/2018)  Physical Activity: Unknown (09/09/2018)  Social Connections: Unknown (09/09/2018)  Tobacco Use: Medium Risk (05/04/2023)   SDOH Interventions: Transportation Interventions: Inpatient TOC   Readmission Risk Interventions     No data to display

## 2023-05-09 ENCOUNTER — Inpatient Hospital Stay: Payer: Medicare HMO

## 2023-05-09 DIAGNOSIS — J441 Chronic obstructive pulmonary disease with (acute) exacerbation: Secondary | ICD-10-CM | POA: Diagnosis not present

## 2023-05-09 MED ORDER — GUAIFENESIN ER 600 MG PO TB12: 600.0000 mg | ORAL_TABLET | Freq: Two times a day (BID) | ORAL | Status: AC | PRN

## 2023-05-09 MED ORDER — IPRATROPIUM-ALBUTEROL 0.5-2.5 (3) MG/3ML IN SOLN
3.0000 mL | Freq: Three times a day (TID) | RESPIRATORY_TRACT | Status: DC
  Administered 2023-05-09: 3 mL via RESPIRATORY_TRACT

## 2023-05-09 MED ORDER — PSEUDOEPHEDRINE HCL ER 120 MG PO TB12
120.0000 mg | ORAL_TABLET | Freq: Two times a day (BID) | ORAL | Status: DC
  Administered 2023-05-09: 120 mg via ORAL
  Filled 2023-05-09 (×2): qty 1

## 2023-05-09 NOTE — Consult Note (Signed)
   [facility] CM Inpatient Consult   05/09/2023  Audrey Sexton Mar 16, 1951 [id number]  Primary Care Provider:  Dr. Nathanial Rancher [facility])  Patient is currently active with Care Management for chronic disease management services.  Patient has been engaged by a  care coordinator.  Our community based plan of care has focused on disease management and community resource support.   Patient will receive a post hospital call and will be evaluated for assessments and disease process education.   Plan: Discharge today with [facility] ([facility]).   Inpatient Transition Of Care [TOC] team member to make aware that Care Management following.  Of note, Care Management services does not replace or interfere with any services that are needed or arranged by inpatient [facility] care management team.   For additional questions or referrals please contact:  Elliot Cousin, RN, [facility] Liaison [place]   Population Health Office Hours MTWF  8:00 am-6:00 pm [phone] mobile [phone] [Office toll free line] Office Hours are M-F 8:30 - 5 pm [email or web address]

## 2023-05-09 NOTE — TOC Transition Note (Addendum)
Transition of Care [facility]) - CM/SW Discharge Note   Patient Details  Name: Audrey Sexton MRN: [id number] Date of Birth: 1951/07/16  Transition of Care [facility]) CM/SW Contact:  Allena Katz, LCSW Phone Number: 05/09/2023, 1:54 PM   Clinical Narrative:   Pt has orders to discharge home. Pt reports she would like HH nursing. No preference. [facility] accepted for [facility] nursing. Pt also wants resources for aides at home as she lives alone. CSW informed her about helper bees program through [facility]. Pt agreeable for referral to be made to [facility] to screen for this.   Final next level of care: Home/Self Care Barriers to Discharge: Barriers Resolved   Patient Goals and CMS Choice CMS Medicare.gov Compare Post Acute Care list provided to:: Patient Represenative (must comment) (daughter) Choice offered to / list presented to : Patient  Discharge Placement                  Patient to be transferred to facility by: daughter      Discharge Plan and Services Additional resources added to the After Visit Summary for                  DME Arranged: Oxygen DME Agency: [facility]                  Social Determinants of Health (SDOH) Interventions SDOH Screenings   Food Insecurity: No Food Insecurity (09/15/2022)  Housing: Low Risk  (09/15/2022)  Transportation Needs: Unmet Transportation Needs (09/15/2022)  Utilities: Not At Risk (09/15/2022)  Financial Resource Strain: Low Risk  (09/09/2018)  Physical Activity: Unknown (09/09/2018)  Social Connections: Unknown (09/09/2018)  Tobacco Use: Medium Risk (05/04/2023)     Readmission Risk Interventions     No data to display

## 2023-05-09 NOTE — Discharge Summary (Incomplete)
Physician Discharge Summary  Patient: Audrey Sexton ZOX:[id number] DOB: 05-12-1951   Code Status: Limited: Do not attempt resuscitation (DNR) -DNR-LIMITED -Do Not Intubate/DNI  Admit date: 05/04/2023 Discharge date: 05/09/2023 Disposition: Home health, PT and OT PCP: Hamrick, Maura L, MD  Recommendations for Outpatient Follow-up:  Follow up with PCP within 1-2 weeks Regarding general hospital follow up and preventative care Recommend    Discharge Diagnoses:  Principal Problem:   COPD exacerbation (HCC) Active Problems:   Acute on chronic respiratory failure with hypoxia (HCC)   Essential hypertension   Dyslipidemia   COPD (chronic obstructive pulmonary disease) (HCC)   HTN (hypertension)   HLD (hyperlipidemia)   COPD with acute exacerbation (HCC)   Chronic respiratory failure with hypoxia (HCC)   GERD without esophagitis  Brief Hospital Course Summary: Pt ***  Discharge Condition: {DISCHARGE CONDITION:19696}, improved Recommended discharge diet: {Discharge WJXB:[id number]}  Consultations: ***  Procedures/Studies: ***  Discharge Instructions     Discharge patient   Complete by: As directed    Discharge disposition: 06-Home-Health Care Svc   Discharge patient date: 05/09/2023      Allergies as of 05/09/2023       Reactions   Bee Venom Shortness Of Breath, Swelling   Lactose Intolerance (gi) Diarrhea   Percocet [oxycodone-acetaminophen] Nausea And Vomiting   Vicodin [hydrocodone-acetaminophen] Nausea And Vomiting   Penicillins Hives   Has patient had a PCN reaction causing immediate rash, facial/tongue/throat swelling, SOB or lightheadedness with hypotension: no Has patient had a PCN reaction causing severe rash involving mucus membranes or skin necrosis: no Has patient had a PCN reaction that required hospitalization  no Has patient had a PCN reaction occurring within the last 10 years: no If all of the above answers are "NO", then may proceed with  Cephalosporin use.        Medication List     STOP taking these medications    famotidine 20 MG tablet Commonly known as: PEPCID       TAKE these medications    albuterol 108 (90 Base) MCG/ACT inhaler Commonly known as: VENTOLIN HFA Inhale 2-4 puffs by mouth every 4 hours as needed for wheezing, cough, and/or shortness of breath   aspirin EC 81 MG tablet Take 1 tablet (81 mg total) by mouth daily.   atorvastatin 80 MG tablet Commonly known as: LIPITOR Take 80 mg by mouth at bedtime.   Cholecalciferol 25 MCG (1000 UT) tablet Take 1,000 Units by mouth daily.   cyanocobalamin 1000 MCG tablet Commonly known as: VITAMIN B12 Take 1,000 mcg by mouth daily.   diltiazem 300 MG 24 hr capsule Commonly known as: CARDIZEM CD Take 1 capsule (300 mg total) by mouth at bedtime.   DULoxetine 60 MG capsule Commonly known as: CYMBALTA Take 60 mg by mouth every morning.   fluticasone 50 MCG/ACT nasal spray Commonly known as: FLONASE Place 1 spray into both nostrils daily as needed for allergies.   fluticasone furoate-vilanterol 100-25 MCG/INH Aepb Commonly known as: BREO ELLIPTA Inhale 1 puff into the lungs daily.   guaiFENesin 600 MG 12 hr tablet Commonly known as: MUCINEX Take 1 tablet (600 mg total) by mouth 2 (two) times daily as needed. What changed:  when to take this reasons to take this   ipratropium-albuterol 0.5-2.5 (3) MG/3ML Soln Commonly known as: DUONEB Take 3 mLs by nebulization every 6 (six) hours as needed.   lisinopril 20 MG tablet Commonly known as: ZESTRIL Take 20 mg by mouth daily.  montelukast 10 MG tablet Commonly known as: SINGULAIR Take 10 mg by mouth daily.   MULTI-VITAMIN DAILY PO Take 1 tablet by mouth daily.   omeprazole 20 MG capsule Commonly known as: PRILOSEC Take 20 mg by mouth daily.   oxybutynin 15 MG 24 hr tablet Commonly known as: DITROPAN XL Take 15 mg by mouth daily.   potassium chloride SA 20 MEQ tablet Commonly  known as: KLOR-CON M Take 1 tablet (20 mEq total) by mouth daily.   umeclidinium bromide 62.5 MCG/INH Aepb Commonly known as: INCRUSE ELLIPTA Inhale 1 puff into the lungs daily.   vitamin C 1000 MG tablet Take 1,000 mg by mouth daily.        Follow-up Information     Hamrick, Durward Fortes, MD Follow up.   Specialty: Family Medicine [address] information[address] [place] [address] [phone]                 Subjective   Pt reports ***  All questions and concerns were addressed at time of discharge.  Objective  Blood pressure (!) 134/99, pulse 97, temperature 97.9 F (36.6 C), resp. rate 19, height 5\' 4"  (1.626 m), weight 86.4 kg, SpO2 90%.   General: Pt is alert, awake, not in acute distress Cardiovascular: RRR, S1/S2 +, no rubs, no gallops Respiratory: CTA bilaterally, no wheezing, no rhonchi Abdominal: Soft, NT, ND, bowel sounds + Extremities: no edema, no cyanosis  The results of significant diagnostics from this hospitalization (including imaging, microbiology, ancillary and laboratory) are listed below for reference.   Imaging studies: DG Chest Port 1 View  Result Date: 05/09/2023 CLINICAL DATA:  Respiratory distress.  History of COPD EXAM: PORTABLE CHEST 1 VIEW COMPARISON:  05/04/2023 FINDINGS: Hyperinflation and apical lucency. Bronchopneumonia may be present at the left lung base. There is a background of pulmonary scarring especially at the right lung base. No effusion or pneumothorax. Normal heart size and mediastinal contours. IMPRESSION: Emphysema and pulmonary scarring on the right. Bronchopneumonia is possible at the left lower lobe. Electronically Signed   By: Tiburcio Pea M.D.   On: 05/09/2023 13:25   DG Chest 2 View  Result Date: 05/04/2023 CLINICAL DATA:  COPD. EXAM: CHEST - 2 VIEW COMPARISON:  November 15, 2020 FINDINGS: The heart size and mediastinal contours are within normal limits. There is marked severity calcification of the  thoracic aorta. There is evidence of emphysematous lung disease with mild, chronic mid right lung and bibasilar scarring and/or atelectasis. Mild, chronic right-sided volume loss is also seen. There is no evidence of acute infiltrate, pleural effusion or pneumothorax. Multilevel degenerative changes seen throughout the thoracic spine. IMPRESSION: Emphysematous lung disease with mild, chronic mid right lung and bibasilar scarring and/or atelectasis. Electronically Signed   By: Aram Candela M.D.   On: 05/04/2023 20:22    Labs: Basic Metabolic Panel: Recent Labs  Lab 05/04/23 1859 05/05/23 0104 05/06/23 0453  NA 142 141 141  K 3.6 3.8 3.9  CL 104 104 105  CO2 29 28 28   GLUCOSE 121* 241* 143*  BUN 18 16 22   CREATININE 1.05* 1.10* 1.01*  CALCIUM 9.3 9.2 9.0   CBC: Recent Labs  Lab 05/04/23 1859 05/05/23 0104  WBC 9.4 8.2  HGB 12.0 12.2  HCT 37.7 37.9  MCV 91.3 89.8  PLT 246 239   Microbiology: ***  Time coordinating discharge: Over 30 minutes  Leeroy Bock, MD  [facility] 05/09/2023, 1:48 PM

## 2023-05-09 NOTE — Progress Notes (Incomplete)
PROGRESS NOTE    Audrey Sexton   YHC:[id number] DOB: [birth year]  DOA: 05/04/2023 Date of Service: 05/09/23 PCP: Ailene Ravel, MD     Brief Narrative / Hospital Course:  INNOCENCE SOSNA is a 73 y.o. female with medical history significant for asthma, COPD on 3L O2, hypertension, dyslipidemia, and CVA, who presented to the emergency room with acute onset of worsening dyspnea with associated cough productive of yellowish sputum as well as wheezing x4 days.  09/07: to ED, HR 114 (EKG sinus tach), RR 24, SpO2 94% on 3L O2  but occasionally requiring 4L. CXR (+)emphysematous lung disease with mild chronic mid right lung and bibasal scarring and/or atelectasis. Tx nebs w/ EMS/ED and IV steroid x1 w/ EMS, admitted to hospitalist service for COPD exacerbation w/ (+)SIRS criteria.  09/08-09/09: significant O2 desat on ambulation. No discharge for now  09/10: lung sounds less wheezing, still desat and SOB on ambulation, possible for d/c tomorrow if improving        Consultants:  none   Procedures: none           ASSESSMENT & PLAN:   Principal Problem:   COPD exacerbation (HCC) Active Problems:   Acute on chronic respiratory failure with hypoxia (HCC)   Essential hypertension   Dyslipidemia   COPD (chronic obstructive pulmonary disease) (HCC)   HTN (hypertension)   HLD (hyperlipidemia)   COPD with acute exacerbation (HCC)   Chronic respiratory failure with hypoxia (HCC)   GERD without esophagitis   COPD/Asthma exacerbation+ Acute on chronic hypoxic respiratory failure  Bronchodilators - DuoNeb q4h scheduled + additional prn IV steroids transitioned to po  significant wheezing, ICS w/ budesonide Mucolytics  Cough suppressant as needed w/ Tussionex IV ceftriaxone for now - will stay on abx given severity of COPD  Continue home montelukast  Supplemental O2 Hold home ICS-LABA Breo for now  Hold home LAMA Incruse    Hx HFpEF - Echo 2021 LVEF 65-70%, G1DD BNP here  normal Monitor fluid status, no diuretics needed at this time  D/c IV fluids   Allergies  Continue home Flonase    Essential HTN Continue home lisinopril, diltiazem,    Hx CVA ASA + statin    HLD Statin   GERD w/o esophagitis PPI + H2B   Anxiety/Depression Continue home Cymbalta   Overactive Bladder  Continue home oxybutinin         Body mass index is 33.38 kg/m.      DVT prophylaxis: lovenox  IV fluids: no continuous IV fluids  Nutrition: cardiac diet  Central lines / invasive devices: none   Code Status: DNR ACP documentation reviewed: 05/05/23 has living will on file in [facility]   Current Admission Status: inpatient   LOS: 3 TOC needs: none at this time  Barriers to discharge / significant pending items: COPD exacerbation, await clinical improvement, improving slowly, hopeful for d/c home tomorrow if ambulating well   Subjective / Brief ROS:  Patient reports still significant SOB especially on exertion but somewhat better than yesterday and day before  Denies new weakness.  Tolerating diet.  NO chest pain   Family Communication: none at this time    Objective Findings:  Vitals:   05/08/23 1620 05/08/23 2004 05/08/23 2007 05/09/23 0423  BP: (!) 147/78 (!) 147/71  (!) 134/99  Pulse: 95 (!) 101  97  Resp: 18 20  19   Temp: 98.6 F (37 C) 98.5 F (36.9 C)  97.9 F (36.6 C)  TempSrc:  SpO2: 95% 98% 96% 90%  Weight:      Height:        Intake/Output Summary (Last 24 hours) at 05/09/2023 0713 Last data filed at 05/09/2023 2956 Gross per 24 hour  Intake 100 ml  Output 650 ml  Net -550 ml   Filed Weights   05/04/23 1850 05/05/23 0006 05/08/23 0749  Weight: 83.5 kg 88.2 kg 86.4 kg    Examination:  Physical Exam Constitutional:      General: Audrey Sexton is not in acute distress. Cardiovascular:     Rate and Rhythm: Normal rate and regular rhythm.  Pulmonary:     Effort: Pulmonary effort is normal. No accessory muscle usage.     Breath sounds:  Wheezing (furhter improved from yesterday - scattered all fields) present.  Neurological:     General: No focal deficit present.     Mental Status: Audrey Sexton is alert and oriented to person, place, and time.  Psychiatric:        Mood and Affect: Mood normal.        Behavior: Behavior normal.          Scheduled Medications:   aspirin EC  81 mg Oral Daily   atorvastatin  80 mg Oral Daily   budesonide (PULMICORT) nebulizer solution  0.5 mg Nebulization BID   diltiazem  300 mg Oral QHS   DULoxetine  60 mg Oral q morning   enoxaparin (LOVENOX) injection  40 mg Subcutaneous Q24H   guaiFENesin  600 mg Oral BID   ipratropium-albuterol  3 mL Nebulization QID   lisinopril  20 mg Oral Daily   montelukast  10 mg Oral Daily   multivitamin with minerals   Oral Daily   oxybutynin  15 mg Oral Daily   pantoprazole  40 mg Oral Daily   predniSONE  50 mg Oral Q breakfast    Continuous Infusions:    PRN Medications:  acetaminophen **OR** acetaminophen, chlorpheniramine-HYDROcodone, fluticasone, ipratropium-albuterol, magnesium hydroxide, ondansetron **OR** ondansetron (ZOFRAN) IV, traZODone  Antimicrobials from admission:  Anti-infectives (From admission, onward)    Start     Dose/Rate Route Frequency Ordered Stop   05/04/23 2345  cefTRIAXone (ROCEPHIN) 1 g in sodium chloride 0.9 % 100 mL IVPB        1 g 200 mL/hr over 30 Minutes Intravenous Every 24 hours 05/04/23 2341 05/09/23 0036           Data Reviewed:  I have personally reviewed the following...  CBC: Recent Labs  Lab 05/04/23 1859 05/05/23 0104  WBC 9.4 8.2  HGB 12.0 12.2  HCT 37.7 37.9  MCV 91.3 89.8  PLT 246 239   Basic Metabolic Panel: Recent Labs  Lab 05/04/23 1859 05/05/23 0104 05/06/23 0453  NA 142 141 141  K 3.6 3.8 3.9  CL 104 104 105  CO2 29 28 28   GLUCOSE 121* 241* 143*  BUN 18 16 22   CREATININE 1.05* 1.10* 1.01*  CALCIUM 9.3 9.2 9.0   GFR: Estimated Creatinine Clearance: 52.8 mL/min (A) (by  C-G formula based on SCr of 1.01 mg/dL (H)). Liver Function Tests: Recent Labs  Lab 05/04/23 2058  AST 33  ALT 32  ALKPHOS 54  BILITOT 0.5  PROT 6.9  ALBUMIN 3.6   No results for input(s): "LIPASE", "AMYLASE" in the last 168 hours. No results for input(s): "AMMONIA" in the last 168 hours. Coagulation Profile: No results for input(s): "INR", "PROTIME" in the last 168 hours. Cardiac Enzymes: No results for input(s): "CKTOTAL", "CKMB", "CKMBINDEX", "  TROPONINI" in the last 168 hours. BNP (last 3 results) No results for input(s): "PROBNP" in the last 8760 hours. HbA1C: No results for input(s): "HGBA1C" in the last 72 hours. CBG: No results for input(s): "GLUCAP" in the last 168 hours. Lipid Profile: No results for input(s): "CHOL", "HDL", "LDLCALC", "TRIG", "CHOLHDL", "LDLDIRECT" in the last 72 hours. Thyroid Function Tests: No results for input(s): "TSH", "T4TOTAL", "FREET4", "T3FREE", "THYROIDAB" in the last 72 hours. Anemia Panel: No results for input(s): "VITAMINB12", "FOLATE", "FERRITIN", "TIBC", "IRON", "RETICCTPCT" in the last 72 hours. Most Recent Urinalysis On File:     Component Value Date/Time   COLORURINE AMBER (A) 09/14/2022 2023   APPEARANCEUR CLOUDY (A) 09/14/2022 2023   APPEARANCEUR Clear 06/16/2014 2108   LABSPEC 1.025 09/14/2022 2023   LABSPEC 1.003 06/16/2014 2108   PHURINE 5.0 09/14/2022 2023   GLUCOSEU NEGATIVE 09/14/2022 2023   GLUCOSEU Negative 06/16/2014 2108   HGBUR NEGATIVE 09/14/2022 2023   BILIRUBINUR NEGATIVE 09/14/2022 2023   BILIRUBINUR Negative 06/16/2014 2108   KETONESUR 5 (A) 09/14/2022 2023   PROTEINUR 100 (A) 09/14/2022 2023   NITRITE NEGATIVE 09/14/2022 2023   LEUKOCYTESUR TRACE (A) 09/14/2022 2023   LEUKOCYTESUR Negative 06/16/2014 2108   Sepsis Labs: @LABRCNTIP (procalcitonin:4,lacticidven:4) Microbiology: No results found for this or any previous visit (from the past 240 hour(s)).    Radiology Studies last 3 days: No results  found.           LOS: 5 days       Leeroy Bock, DO [facility] 05/09/2023, 7:13 AM    Dictation software may have been used to generate the above note. Typos may occur and escape review in typed/dictated notes. Please contact Dr Lyn Hollingshead directly for clarity if needed.  Staff may message me via secure chat in Epic  but this may not receive an immediate response,  please page me for urgent matters!  If 7PM-7AM, please contact night coverage [email or web address]

## 2023-05-09 NOTE — Progress Notes (Signed)
Mobility Specialist - Progress Note   05/09/23 1421  Mobility  Activity Ambulated with assistance in room  Level of Assistance Standby assist, set-up cues, supervision of patient - no hands on  Assistive Device None  Distance Ambulated (ft) 48 ft  Activity Response Tolerated well  $Mobility charge 1 Mobility  Mobility Specialist Start Time (ACUTE ONLY) 1414  Mobility Specialist Stop Time (ACUTE ONLY) 1420  Mobility Specialist Time Calculation (min) (ACUTE ONLY) 6 min   Zetta Bills Mobility Specialist 05/09/23 2:22 PM

## 2023-05-09 NOTE — Progress Notes (Signed)
Nutrition Education Note  RD consulted for nutrition education regarding a Heart Healthy diet.   Lipid Panel     Component Value Date/Time   CHOL 130 09/06/2018 0529   TRIG 146 09/06/2018 0529   HDL 33 (L) 09/06/2018 0529   CHOLHDL 3.9 09/06/2018 0529   VLDL 29 09/06/2018 0529   LDLCALC 68 09/06/2018 0529    RD provided "Heart Healthy Nutrition Therapy" handout from the Academy of [employer]etics. Reviewed patient's dietary recall. Provided examples on ways to decrease sodium and fat intake in diet. Discouraged intake of processed foods and use of salt shaker. Encouraged fresh fruits and vegetables as well as whole grain sources of carbohydrates to maximize fiber intake. Teach back method used.  Expect fair compliance.  Body mass index is 32.7 kg/m. Pt meets criteria for obese based on current BMI.  Current diet order is Heart Healthy, patient is consuming approximately 100% of meals at this time. Labs and medications reviewed. No further nutrition interventions warranted at this time. RD contact information provided. If additional nutrition issues arise, please re-consult RD.  Bethann Humble, RD, LDN, CNSC.

## 2023-05-15 DIAGNOSIS — I5032 Chronic diastolic (congestive) heart failure: Secondary | ICD-10-CM | POA: Diagnosis not present

## 2023-05-15 DIAGNOSIS — J45901 Unspecified asthma with (acute) exacerbation: Secondary | ICD-10-CM | POA: Diagnosis not present

## 2023-05-15 DIAGNOSIS — E785 Hyperlipidemia, unspecified: Secondary | ICD-10-CM | POA: Diagnosis not present

## 2023-05-15 DIAGNOSIS — J9621 Acute and chronic respiratory failure with hypoxia: Secondary | ICD-10-CM | POA: Diagnosis not present

## 2023-05-15 DIAGNOSIS — I110 Hypertensive heart disease with heart failure: Secondary | ICD-10-CM | POA: Diagnosis not present

## 2023-05-15 DIAGNOSIS — J441 Chronic obstructive pulmonary disease with (acute) exacerbation: Secondary | ICD-10-CM | POA: Diagnosis not present

## 2023-05-15 DIAGNOSIS — K219 Gastro-esophageal reflux disease without esophagitis: Secondary | ICD-10-CM | POA: Diagnosis not present

## 2023-05-15 DIAGNOSIS — J9622 Acute and chronic respiratory failure with hypercapnia: Secondary | ICD-10-CM | POA: Diagnosis not present

## 2023-05-15 DIAGNOSIS — D649 Anemia, unspecified: Secondary | ICD-10-CM | POA: Diagnosis not present

## 2023-05-17 DIAGNOSIS — I5032 Chronic diastolic (congestive) heart failure: Secondary | ICD-10-CM | POA: Diagnosis not present

## 2023-05-17 DIAGNOSIS — J45901 Unspecified asthma with (acute) exacerbation: Secondary | ICD-10-CM | POA: Diagnosis not present

## 2023-05-17 DIAGNOSIS — K219 Gastro-esophageal reflux disease without esophagitis: Secondary | ICD-10-CM | POA: Diagnosis not present

## 2023-05-17 DIAGNOSIS — J9621 Acute and chronic respiratory failure with hypoxia: Secondary | ICD-10-CM | POA: Diagnosis not present

## 2023-05-17 DIAGNOSIS — I110 Hypertensive heart disease with heart failure: Secondary | ICD-10-CM | POA: Diagnosis not present

## 2023-05-17 DIAGNOSIS — E785 Hyperlipidemia, unspecified: Secondary | ICD-10-CM | POA: Diagnosis not present

## 2023-05-17 DIAGNOSIS — J441 Chronic obstructive pulmonary disease with (acute) exacerbation: Secondary | ICD-10-CM | POA: Diagnosis not present

## 2023-05-17 DIAGNOSIS — D649 Anemia, unspecified: Secondary | ICD-10-CM | POA: Diagnosis not present

## 2023-05-17 DIAGNOSIS — J9622 Acute and chronic respiratory failure with hypercapnia: Secondary | ICD-10-CM | POA: Diagnosis not present

## 2023-05-21 DIAGNOSIS — J449 Chronic obstructive pulmonary disease, unspecified: Secondary | ICD-10-CM | POA: Diagnosis not present

## 2023-05-21 DIAGNOSIS — R6889 Other general symptoms and signs: Secondary | ICD-10-CM | POA: Diagnosis not present

## 2023-05-21 DIAGNOSIS — I10 Essential (primary) hypertension: Secondary | ICD-10-CM | POA: Diagnosis not present

## 2023-05-21 DIAGNOSIS — Z79899 Other long term (current) drug therapy: Secondary | ICD-10-CM | POA: Diagnosis not present

## 2023-05-22 DIAGNOSIS — I5032 Chronic diastolic (congestive) heart failure: Secondary | ICD-10-CM | POA: Diagnosis not present

## 2023-05-22 DIAGNOSIS — E785 Hyperlipidemia, unspecified: Secondary | ICD-10-CM | POA: Diagnosis not present

## 2023-05-22 DIAGNOSIS — K219 Gastro-esophageal reflux disease without esophagitis: Secondary | ICD-10-CM | POA: Diagnosis not present

## 2023-05-22 DIAGNOSIS — J9621 Acute and chronic respiratory failure with hypoxia: Secondary | ICD-10-CM | POA: Diagnosis not present

## 2023-05-22 DIAGNOSIS — I110 Hypertensive heart disease with heart failure: Secondary | ICD-10-CM | POA: Diagnosis not present

## 2023-05-22 DIAGNOSIS — D649 Anemia, unspecified: Secondary | ICD-10-CM | POA: Diagnosis not present

## 2023-05-22 DIAGNOSIS — J9622 Acute and chronic respiratory failure with hypercapnia: Secondary | ICD-10-CM | POA: Diagnosis not present

## 2023-05-22 DIAGNOSIS — J45901 Unspecified asthma with (acute) exacerbation: Secondary | ICD-10-CM | POA: Diagnosis not present

## 2023-05-22 DIAGNOSIS — J441 Chronic obstructive pulmonary disease with (acute) exacerbation: Secondary | ICD-10-CM | POA: Diagnosis not present

## 2023-05-24 DIAGNOSIS — E785 Hyperlipidemia, unspecified: Secondary | ICD-10-CM | POA: Diagnosis not present

## 2023-05-24 DIAGNOSIS — I110 Hypertensive heart disease with heart failure: Secondary | ICD-10-CM | POA: Diagnosis not present

## 2023-05-24 DIAGNOSIS — J9621 Acute and chronic respiratory failure with hypoxia: Secondary | ICD-10-CM | POA: Diagnosis not present

## 2023-05-24 DIAGNOSIS — J9622 Acute and chronic respiratory failure with hypercapnia: Secondary | ICD-10-CM | POA: Diagnosis not present

## 2023-05-24 DIAGNOSIS — J441 Chronic obstructive pulmonary disease with (acute) exacerbation: Secondary | ICD-10-CM | POA: Diagnosis not present

## 2023-05-24 DIAGNOSIS — I5032 Chronic diastolic (congestive) heart failure: Secondary | ICD-10-CM | POA: Diagnosis not present

## 2023-05-24 DIAGNOSIS — J45901 Unspecified asthma with (acute) exacerbation: Secondary | ICD-10-CM | POA: Diagnosis not present

## 2023-05-24 DIAGNOSIS — D649 Anemia, unspecified: Secondary | ICD-10-CM | POA: Diagnosis not present

## 2023-05-24 DIAGNOSIS — K219 Gastro-esophageal reflux disease without esophagitis: Secondary | ICD-10-CM | POA: Diagnosis not present

## 2023-05-30 DIAGNOSIS — E785 Hyperlipidemia, unspecified: Secondary | ICD-10-CM | POA: Diagnosis not present

## 2023-05-30 DIAGNOSIS — J441 Chronic obstructive pulmonary disease with (acute) exacerbation: Secondary | ICD-10-CM | POA: Diagnosis not present

## 2023-05-30 DIAGNOSIS — J9621 Acute and chronic respiratory failure with hypoxia: Secondary | ICD-10-CM | POA: Diagnosis not present

## 2023-05-30 DIAGNOSIS — I5032 Chronic diastolic (congestive) heart failure: Secondary | ICD-10-CM | POA: Diagnosis not present

## 2023-05-30 DIAGNOSIS — J45901 Unspecified asthma with (acute) exacerbation: Secondary | ICD-10-CM | POA: Diagnosis not present

## 2023-05-30 DIAGNOSIS — J9622 Acute and chronic respiratory failure with hypercapnia: Secondary | ICD-10-CM | POA: Diagnosis not present

## 2023-05-30 DIAGNOSIS — D649 Anemia, unspecified: Secondary | ICD-10-CM | POA: Diagnosis not present

## 2023-05-30 DIAGNOSIS — K219 Gastro-esophageal reflux disease without esophagitis: Secondary | ICD-10-CM | POA: Diagnosis not present

## 2023-05-30 DIAGNOSIS — I110 Hypertensive heart disease with heart failure: Secondary | ICD-10-CM | POA: Diagnosis not present

## 2023-06-05 DIAGNOSIS — J45901 Unspecified asthma with (acute) exacerbation: Secondary | ICD-10-CM | POA: Diagnosis not present

## 2023-06-05 DIAGNOSIS — K219 Gastro-esophageal reflux disease without esophagitis: Secondary | ICD-10-CM | POA: Diagnosis not present

## 2023-06-05 DIAGNOSIS — I110 Hypertensive heart disease with heart failure: Secondary | ICD-10-CM | POA: Diagnosis not present

## 2023-06-05 DIAGNOSIS — J9621 Acute and chronic respiratory failure with hypoxia: Secondary | ICD-10-CM | POA: Diagnosis not present

## 2023-06-05 DIAGNOSIS — J9622 Acute and chronic respiratory failure with hypercapnia: Secondary | ICD-10-CM | POA: Diagnosis not present

## 2023-06-05 DIAGNOSIS — E785 Hyperlipidemia, unspecified: Secondary | ICD-10-CM | POA: Diagnosis not present

## 2023-06-05 DIAGNOSIS — I5032 Chronic diastolic (congestive) heart failure: Secondary | ICD-10-CM | POA: Diagnosis not present

## 2023-06-05 DIAGNOSIS — J441 Chronic obstructive pulmonary disease with (acute) exacerbation: Secondary | ICD-10-CM | POA: Diagnosis not present

## 2023-06-05 DIAGNOSIS — D649 Anemia, unspecified: Secondary | ICD-10-CM | POA: Diagnosis not present

## 2023-06-12 DIAGNOSIS — J9622 Acute and chronic respiratory failure with hypercapnia: Secondary | ICD-10-CM | POA: Diagnosis not present

## 2023-06-12 DIAGNOSIS — E785 Hyperlipidemia, unspecified: Secondary | ICD-10-CM | POA: Diagnosis not present

## 2023-06-12 DIAGNOSIS — I5032 Chronic diastolic (congestive) heart failure: Secondary | ICD-10-CM | POA: Diagnosis not present

## 2023-06-12 DIAGNOSIS — K219 Gastro-esophageal reflux disease without esophagitis: Secondary | ICD-10-CM | POA: Diagnosis not present

## 2023-06-12 DIAGNOSIS — I110 Hypertensive heart disease with heart failure: Secondary | ICD-10-CM | POA: Diagnosis not present

## 2023-06-12 DIAGNOSIS — J45901 Unspecified asthma with (acute) exacerbation: Secondary | ICD-10-CM | POA: Diagnosis not present

## 2023-06-12 DIAGNOSIS — J441 Chronic obstructive pulmonary disease with (acute) exacerbation: Secondary | ICD-10-CM | POA: Diagnosis not present

## 2023-06-12 DIAGNOSIS — J9621 Acute and chronic respiratory failure with hypoxia: Secondary | ICD-10-CM | POA: Diagnosis not present

## 2023-06-12 DIAGNOSIS — D649 Anemia, unspecified: Secondary | ICD-10-CM | POA: Diagnosis not present

## 2023-06-13 DIAGNOSIS — D649 Anemia, unspecified: Secondary | ICD-10-CM | POA: Diagnosis not present

## 2023-06-13 DIAGNOSIS — I110 Hypertensive heart disease with heart failure: Secondary | ICD-10-CM | POA: Diagnosis not present

## 2023-06-13 DIAGNOSIS — J9622 Acute and chronic respiratory failure with hypercapnia: Secondary | ICD-10-CM | POA: Diagnosis not present

## 2023-06-13 DIAGNOSIS — J9621 Acute and chronic respiratory failure with hypoxia: Secondary | ICD-10-CM | POA: Diagnosis not present

## 2023-06-13 DIAGNOSIS — I5032 Chronic diastolic (congestive) heart failure: Secondary | ICD-10-CM | POA: Diagnosis not present

## 2023-06-13 DIAGNOSIS — K219 Gastro-esophageal reflux disease without esophagitis: Secondary | ICD-10-CM | POA: Diagnosis not present

## 2023-06-13 DIAGNOSIS — J441 Chronic obstructive pulmonary disease with (acute) exacerbation: Secondary | ICD-10-CM | POA: Diagnosis not present

## 2023-06-13 DIAGNOSIS — J45901 Unspecified asthma with (acute) exacerbation: Secondary | ICD-10-CM | POA: Diagnosis not present

## 2023-06-13 DIAGNOSIS — E785 Hyperlipidemia, unspecified: Secondary | ICD-10-CM | POA: Diagnosis not present

## 2023-06-14 DIAGNOSIS — E785 Hyperlipidemia, unspecified: Secondary | ICD-10-CM | POA: Diagnosis not present

## 2023-06-14 DIAGNOSIS — J9621 Acute and chronic respiratory failure with hypoxia: Secondary | ICD-10-CM | POA: Diagnosis not present

## 2023-06-14 DIAGNOSIS — J45901 Unspecified asthma with (acute) exacerbation: Secondary | ICD-10-CM | POA: Diagnosis not present

## 2023-06-14 DIAGNOSIS — I110 Hypertensive heart disease with heart failure: Secondary | ICD-10-CM | POA: Diagnosis not present

## 2023-06-14 DIAGNOSIS — J9622 Acute and chronic respiratory failure with hypercapnia: Secondary | ICD-10-CM | POA: Diagnosis not present

## 2023-06-14 DIAGNOSIS — K219 Gastro-esophageal reflux disease without esophagitis: Secondary | ICD-10-CM | POA: Diagnosis not present

## 2023-06-14 DIAGNOSIS — J441 Chronic obstructive pulmonary disease with (acute) exacerbation: Secondary | ICD-10-CM | POA: Diagnosis not present

## 2023-06-14 DIAGNOSIS — D649 Anemia, unspecified: Secondary | ICD-10-CM | POA: Diagnosis not present

## 2023-06-14 DIAGNOSIS — I5032 Chronic diastolic (congestive) heart failure: Secondary | ICD-10-CM | POA: Diagnosis not present

## 2023-06-18 DIAGNOSIS — J9622 Acute and chronic respiratory failure with hypercapnia: Secondary | ICD-10-CM | POA: Diagnosis not present

## 2023-06-18 DIAGNOSIS — J441 Chronic obstructive pulmonary disease with (acute) exacerbation: Secondary | ICD-10-CM | POA: Diagnosis not present

## 2023-06-18 DIAGNOSIS — D649 Anemia, unspecified: Secondary | ICD-10-CM | POA: Diagnosis not present

## 2023-06-18 DIAGNOSIS — E785 Hyperlipidemia, unspecified: Secondary | ICD-10-CM | POA: Diagnosis not present

## 2023-06-18 DIAGNOSIS — I110 Hypertensive heart disease with heart failure: Secondary | ICD-10-CM | POA: Diagnosis not present

## 2023-06-18 DIAGNOSIS — K219 Gastro-esophageal reflux disease without esophagitis: Secondary | ICD-10-CM | POA: Diagnosis not present

## 2023-06-18 DIAGNOSIS — I5032 Chronic diastolic (congestive) heart failure: Secondary | ICD-10-CM | POA: Diagnosis not present

## 2023-06-18 DIAGNOSIS — J45901 Unspecified asthma with (acute) exacerbation: Secondary | ICD-10-CM | POA: Diagnosis not present

## 2023-06-18 DIAGNOSIS — J9621 Acute and chronic respiratory failure with hypoxia: Secondary | ICD-10-CM | POA: Diagnosis not present

## 2023-06-19 DIAGNOSIS — K219 Gastro-esophageal reflux disease without esophagitis: Secondary | ICD-10-CM | POA: Diagnosis not present

## 2023-06-19 DIAGNOSIS — D649 Anemia, unspecified: Secondary | ICD-10-CM | POA: Diagnosis not present

## 2023-06-19 DIAGNOSIS — J441 Chronic obstructive pulmonary disease with (acute) exacerbation: Secondary | ICD-10-CM | POA: Diagnosis not present

## 2023-06-19 DIAGNOSIS — I110 Hypertensive heart disease with heart failure: Secondary | ICD-10-CM | POA: Diagnosis not present

## 2023-06-19 DIAGNOSIS — E785 Hyperlipidemia, unspecified: Secondary | ICD-10-CM | POA: Diagnosis not present

## 2023-06-19 DIAGNOSIS — J9621 Acute and chronic respiratory failure with hypoxia: Secondary | ICD-10-CM | POA: Diagnosis not present

## 2023-06-19 DIAGNOSIS — I5032 Chronic diastolic (congestive) heart failure: Secondary | ICD-10-CM | POA: Diagnosis not present

## 2023-06-19 DIAGNOSIS — J9622 Acute and chronic respiratory failure with hypercapnia: Secondary | ICD-10-CM | POA: Diagnosis not present

## 2023-06-19 DIAGNOSIS — J45901 Unspecified asthma with (acute) exacerbation: Secondary | ICD-10-CM | POA: Diagnosis not present

## 2023-06-25 DIAGNOSIS — J45901 Unspecified asthma with (acute) exacerbation: Secondary | ICD-10-CM | POA: Diagnosis not present

## 2023-06-25 DIAGNOSIS — D649 Anemia, unspecified: Secondary | ICD-10-CM | POA: Diagnosis not present

## 2023-06-25 DIAGNOSIS — J9621 Acute and chronic respiratory failure with hypoxia: Secondary | ICD-10-CM | POA: Diagnosis not present

## 2023-06-25 DIAGNOSIS — J441 Chronic obstructive pulmonary disease with (acute) exacerbation: Secondary | ICD-10-CM | POA: Diagnosis not present

## 2023-06-25 DIAGNOSIS — E785 Hyperlipidemia, unspecified: Secondary | ICD-10-CM | POA: Diagnosis not present

## 2023-06-25 DIAGNOSIS — K219 Gastro-esophageal reflux disease without esophagitis: Secondary | ICD-10-CM | POA: Diagnosis not present

## 2023-06-25 DIAGNOSIS — I5032 Chronic diastolic (congestive) heart failure: Secondary | ICD-10-CM | POA: Diagnosis not present

## 2023-06-25 DIAGNOSIS — J9622 Acute and chronic respiratory failure with hypercapnia: Secondary | ICD-10-CM | POA: Diagnosis not present

## 2023-06-25 DIAGNOSIS — I110 Hypertensive heart disease with heart failure: Secondary | ICD-10-CM | POA: Diagnosis not present

## 2023-06-27 DIAGNOSIS — J45901 Unspecified asthma with (acute) exacerbation: Secondary | ICD-10-CM | POA: Diagnosis not present

## 2023-06-27 DIAGNOSIS — I5032 Chronic diastolic (congestive) heart failure: Secondary | ICD-10-CM | POA: Diagnosis not present

## 2023-06-27 DIAGNOSIS — E785 Hyperlipidemia, unspecified: Secondary | ICD-10-CM | POA: Diagnosis not present

## 2023-06-27 DIAGNOSIS — J9621 Acute and chronic respiratory failure with hypoxia: Secondary | ICD-10-CM | POA: Diagnosis not present

## 2023-06-27 DIAGNOSIS — J9622 Acute and chronic respiratory failure with hypercapnia: Secondary | ICD-10-CM | POA: Diagnosis not present

## 2023-06-27 DIAGNOSIS — I110 Hypertensive heart disease with heart failure: Secondary | ICD-10-CM | POA: Diagnosis not present

## 2023-06-27 DIAGNOSIS — D649 Anemia, unspecified: Secondary | ICD-10-CM | POA: Diagnosis not present

## 2023-06-27 DIAGNOSIS — J441 Chronic obstructive pulmonary disease with (acute) exacerbation: Secondary | ICD-10-CM | POA: Diagnosis not present

## 2023-06-27 DIAGNOSIS — K219 Gastro-esophageal reflux disease without esophagitis: Secondary | ICD-10-CM | POA: Diagnosis not present

## 2023-06-28 DIAGNOSIS — K219 Gastro-esophageal reflux disease without esophagitis: Secondary | ICD-10-CM | POA: Diagnosis not present

## 2023-06-28 DIAGNOSIS — D649 Anemia, unspecified: Secondary | ICD-10-CM | POA: Diagnosis not present

## 2023-06-28 DIAGNOSIS — J45901 Unspecified asthma with (acute) exacerbation: Secondary | ICD-10-CM | POA: Diagnosis not present

## 2023-06-28 DIAGNOSIS — E785 Hyperlipidemia, unspecified: Secondary | ICD-10-CM | POA: Diagnosis not present

## 2023-06-28 DIAGNOSIS — J9622 Acute and chronic respiratory failure with hypercapnia: Secondary | ICD-10-CM | POA: Diagnosis not present

## 2023-06-28 DIAGNOSIS — I110 Hypertensive heart disease with heart failure: Secondary | ICD-10-CM | POA: Diagnosis not present

## 2023-06-28 DIAGNOSIS — J9621 Acute and chronic respiratory failure with hypoxia: Secondary | ICD-10-CM | POA: Diagnosis not present

## 2023-06-28 DIAGNOSIS — J441 Chronic obstructive pulmonary disease with (acute) exacerbation: Secondary | ICD-10-CM | POA: Diagnosis not present

## 2023-06-28 DIAGNOSIS — I5032 Chronic diastolic (congestive) heart failure: Secondary | ICD-10-CM | POA: Diagnosis not present

## 2023-07-02 DIAGNOSIS — J441 Chronic obstructive pulmonary disease with (acute) exacerbation: Secondary | ICD-10-CM | POA: Diagnosis not present

## 2023-07-02 DIAGNOSIS — I5032 Chronic diastolic (congestive) heart failure: Secondary | ICD-10-CM | POA: Diagnosis not present

## 2023-07-02 DIAGNOSIS — I110 Hypertensive heart disease with heart failure: Secondary | ICD-10-CM | POA: Diagnosis not present

## 2023-07-02 DIAGNOSIS — E785 Hyperlipidemia, unspecified: Secondary | ICD-10-CM | POA: Diagnosis not present

## 2023-07-02 DIAGNOSIS — J9622 Acute and chronic respiratory failure with hypercapnia: Secondary | ICD-10-CM | POA: Diagnosis not present

## 2023-07-02 DIAGNOSIS — J9621 Acute and chronic respiratory failure with hypoxia: Secondary | ICD-10-CM | POA: Diagnosis not present

## 2023-07-02 DIAGNOSIS — J45901 Unspecified asthma with (acute) exacerbation: Secondary | ICD-10-CM | POA: Diagnosis not present

## 2023-07-02 DIAGNOSIS — K219 Gastro-esophageal reflux disease without esophagitis: Secondary | ICD-10-CM | POA: Diagnosis not present

## 2023-07-02 DIAGNOSIS — D649 Anemia, unspecified: Secondary | ICD-10-CM | POA: Diagnosis not present

## 2023-07-05 DIAGNOSIS — R6889 Other general symptoms and signs: Secondary | ICD-10-CM | POA: Diagnosis not present

## 2023-07-06 DIAGNOSIS — J45901 Unspecified asthma with (acute) exacerbation: Secondary | ICD-10-CM | POA: Diagnosis not present

## 2023-07-06 DIAGNOSIS — J441 Chronic obstructive pulmonary disease with (acute) exacerbation: Secondary | ICD-10-CM | POA: Diagnosis not present

## 2023-07-06 DIAGNOSIS — I110 Hypertensive heart disease with heart failure: Secondary | ICD-10-CM | POA: Diagnosis not present

## 2023-07-06 DIAGNOSIS — J9622 Acute and chronic respiratory failure with hypercapnia: Secondary | ICD-10-CM | POA: Diagnosis not present

## 2023-07-06 DIAGNOSIS — D649 Anemia, unspecified: Secondary | ICD-10-CM | POA: Diagnosis not present

## 2023-07-06 DIAGNOSIS — K219 Gastro-esophageal reflux disease without esophagitis: Secondary | ICD-10-CM | POA: Diagnosis not present

## 2023-07-06 DIAGNOSIS — E785 Hyperlipidemia, unspecified: Secondary | ICD-10-CM | POA: Diagnosis not present

## 2023-07-06 DIAGNOSIS — I5032 Chronic diastolic (congestive) heart failure: Secondary | ICD-10-CM | POA: Diagnosis not present

## 2023-07-06 DIAGNOSIS — J9621 Acute and chronic respiratory failure with hypoxia: Secondary | ICD-10-CM | POA: Diagnosis not present

## 2023-07-09 DIAGNOSIS — K219 Gastro-esophageal reflux disease without esophagitis: Secondary | ICD-10-CM | POA: Diagnosis not present

## 2023-07-09 DIAGNOSIS — I5032 Chronic diastolic (congestive) heart failure: Secondary | ICD-10-CM | POA: Diagnosis not present

## 2023-07-09 DIAGNOSIS — J9621 Acute and chronic respiratory failure with hypoxia: Secondary | ICD-10-CM | POA: Diagnosis not present

## 2023-07-09 DIAGNOSIS — J441 Chronic obstructive pulmonary disease with (acute) exacerbation: Secondary | ICD-10-CM | POA: Diagnosis not present

## 2023-07-09 DIAGNOSIS — I110 Hypertensive heart disease with heart failure: Secondary | ICD-10-CM | POA: Diagnosis not present

## 2023-07-09 DIAGNOSIS — E785 Hyperlipidemia, unspecified: Secondary | ICD-10-CM | POA: Diagnosis not present

## 2023-07-09 DIAGNOSIS — D649 Anemia, unspecified: Secondary | ICD-10-CM | POA: Diagnosis not present

## 2023-07-09 DIAGNOSIS — J45901 Unspecified asthma with (acute) exacerbation: Secondary | ICD-10-CM | POA: Diagnosis not present

## 2023-07-09 DIAGNOSIS — J9622 Acute and chronic respiratory failure with hypercapnia: Secondary | ICD-10-CM | POA: Diagnosis not present

## 2023-08-27 ENCOUNTER — Other Ambulatory Visit: Payer: Self-pay | Admitting: Family Medicine

## 2023-08-27 DIAGNOSIS — Z87891 Personal history of nicotine dependence: Secondary | ICD-10-CM

## 2023-08-27 DIAGNOSIS — Z0000 Encounter for general adult medical examination without abnormal findings: Secondary | ICD-10-CM | POA: Diagnosis not present

## 2023-08-27 DIAGNOSIS — Z139 Encounter for screening, unspecified: Secondary | ICD-10-CM | POA: Diagnosis not present

## 2023-08-27 DIAGNOSIS — Z1331 Encounter for screening for depression: Secondary | ICD-10-CM | POA: Diagnosis not present

## 2023-08-27 DIAGNOSIS — Z9181 History of falling: Secondary | ICD-10-CM | POA: Diagnosis not present

## 2023-08-27 DIAGNOSIS — Z780 Asymptomatic menopausal state: Secondary | ICD-10-CM | POA: Diagnosis not present

## 2023-08-27 DIAGNOSIS — Z1231 Encounter for screening mammogram for malignant neoplasm of breast: Secondary | ICD-10-CM | POA: Diagnosis not present

## 2023-08-28 ENCOUNTER — Other Ambulatory Visit: Payer: Self-pay | Admitting: Family Medicine

## 2023-08-28 DIAGNOSIS — E2839 Other primary ovarian failure: Secondary | ICD-10-CM

## 2023-09-05 ENCOUNTER — Ambulatory Visit
Admission: RE | Admit: 2023-09-05 | Discharge: 2023-09-05 | Disposition: A | Discharge status: Home / Self Care | Payer: 59 | Source: Ambulatory Visit | Attending: Family Medicine | Admitting: Family Medicine

## 2023-09-05 DIAGNOSIS — Z87891 Personal history of nicotine dependence: Secondary | ICD-10-CM | POA: Diagnosis not present

## 2023-09-06 DIAGNOSIS — J449 Chronic obstructive pulmonary disease, unspecified: Secondary | ICD-10-CM | POA: Diagnosis not present

## 2023-09-17 ENCOUNTER — Other Ambulatory Visit: Payer: Self-pay | Admitting: Family Medicine

## 2023-09-17 DIAGNOSIS — R911 Solitary pulmonary nodule: Secondary | ICD-10-CM

## 2023-09-25 ENCOUNTER — Ambulatory Visit
Admission: RE | Admit: 2023-09-25 | Discharge: 2023-09-25 | Disposition: A | Discharge status: Home / Self Care | Payer: 59 | Source: Ambulatory Visit | Attending: Family Medicine | Admitting: Family Medicine

## 2023-09-25 DIAGNOSIS — I700 Atherosclerosis of aorta: Secondary | ICD-10-CM | POA: Insufficient documentation

## 2023-09-25 DIAGNOSIS — J439 Emphysema, unspecified: Secondary | ICD-10-CM | POA: Insufficient documentation

## 2023-09-25 DIAGNOSIS — R911 Solitary pulmonary nodule: Secondary | ICD-10-CM | POA: Insufficient documentation

## 2023-09-25 DIAGNOSIS — R918 Other nonspecific abnormal finding of lung field: Secondary | ICD-10-CM | POA: Diagnosis not present

## 2023-09-25 LAB — GLUCOSE, CAPILLARY: Glucose-Capillary: 96 mg/dL (ref 70–99)

## 2023-09-25 MED ORDER — FLUDEOXYGLUCOSE F - 18 (FDG) INJECTION
10.5800 | Freq: Once | INTRAVENOUS | Status: AC | PRN
  Administered 2023-09-25: 10.58 via INTRAVENOUS

## 2023-10-01 DIAGNOSIS — E785 Hyperlipidemia, unspecified: Secondary | ICD-10-CM | POA: Diagnosis not present

## 2023-10-01 DIAGNOSIS — J9611 Chronic respiratory failure with hypoxia: Secondary | ICD-10-CM | POA: Diagnosis not present

## 2023-10-01 DIAGNOSIS — I471 Supraventricular tachycardia, unspecified: Secondary | ICD-10-CM | POA: Diagnosis not present

## 2023-10-01 DIAGNOSIS — D692 Other nonthrombocytopenic purpura: Secondary | ICD-10-CM | POA: Diagnosis not present

## 2023-10-01 DIAGNOSIS — J449 Chronic obstructive pulmonary disease, unspecified: Secondary | ICD-10-CM | POA: Diagnosis not present

## 2023-10-01 DIAGNOSIS — N1831 Chronic kidney disease, stage 3a: Secondary | ICD-10-CM | POA: Diagnosis not present

## 2023-10-01 DIAGNOSIS — Z23 Encounter for immunization: Secondary | ICD-10-CM | POA: Diagnosis not present

## 2023-10-01 DIAGNOSIS — I10 Essential (primary) hypertension: Secondary | ICD-10-CM | POA: Diagnosis not present

## 2023-10-01 DIAGNOSIS — I69359 Hemiplegia and hemiparesis following cerebral infarction affecting unspecified side: Secondary | ICD-10-CM | POA: Diagnosis not present

## 2023-10-01 DIAGNOSIS — R911 Solitary pulmonary nodule: Secondary | ICD-10-CM | POA: Diagnosis not present

## 2023-10-01 DIAGNOSIS — R7303 Prediabetes: Secondary | ICD-10-CM | POA: Diagnosis not present

## 2023-10-07 DIAGNOSIS — J449 Chronic obstructive pulmonary disease, unspecified: Secondary | ICD-10-CM | POA: Diagnosis not present

## 2023-10-29 ENCOUNTER — Institutional Professional Consult (permissible substitution): Payer: 59 | Admitting: Pulmonary Disease

## 2023-10-30 ENCOUNTER — Encounter: Payer: Self-pay | Admitting: *Deleted

## 2023-10-30 ENCOUNTER — Emergency Department

## 2023-10-30 ENCOUNTER — Other Ambulatory Visit: Payer: Self-pay

## 2023-10-30 ENCOUNTER — Emergency Department
Admission: EM | Admit: 2023-10-30 | Discharge: 2023-10-31 | Disposition: A | Discharge status: Home / Self Care | Attending: Emergency Medicine | Admitting: Emergency Medicine

## 2023-10-30 DIAGNOSIS — S0181XA Laceration without foreign body of other part of head, initial encounter: Secondary | ICD-10-CM | POA: Diagnosis not present

## 2023-10-30 DIAGNOSIS — I10 Essential (primary) hypertension: Secondary | ICD-10-CM | POA: Insufficient documentation

## 2023-10-30 DIAGNOSIS — M545 Low back pain, unspecified: Secondary | ICD-10-CM | POA: Diagnosis not present

## 2023-10-30 DIAGNOSIS — M25562 Pain in left knee: Secondary | ICD-10-CM | POA: Diagnosis not present

## 2023-10-30 DIAGNOSIS — S8002XA Contusion of left knee, initial encounter: Secondary | ICD-10-CM | POA: Diagnosis not present

## 2023-10-30 DIAGNOSIS — W19XXXA Unspecified fall, initial encounter: Secondary | ICD-10-CM | POA: Diagnosis not present

## 2023-10-30 DIAGNOSIS — S0990XA Unspecified injury of head, initial encounter: Secondary | ICD-10-CM | POA: Diagnosis not present

## 2023-10-30 DIAGNOSIS — J449 Chronic obstructive pulmonary disease, unspecified: Secondary | ICD-10-CM | POA: Insufficient documentation

## 2023-10-30 DIAGNOSIS — R519 Headache, unspecified: Secondary | ICD-10-CM | POA: Insufficient documentation

## 2023-10-30 DIAGNOSIS — I6782 Cerebral ischemia: Secondary | ICD-10-CM | POA: Diagnosis not present

## 2023-10-30 DIAGNOSIS — Z23 Encounter for immunization: Secondary | ICD-10-CM | POA: Insufficient documentation

## 2023-10-30 DIAGNOSIS — Z7982 Long term (current) use of aspirin: Secondary | ICD-10-CM | POA: Insufficient documentation

## 2023-10-30 DIAGNOSIS — M5136 Other intervertebral disc degeneration, lumbar region with discogenic back pain only: Secondary | ICD-10-CM | POA: Diagnosis not present

## 2023-10-30 DIAGNOSIS — M47816 Spondylosis without myelopathy or radiculopathy, lumbar region: Secondary | ICD-10-CM | POA: Diagnosis not present

## 2023-10-30 DIAGNOSIS — W010XXA Fall on same level from slipping, tripping and stumbling without subsequent striking against object, initial encounter: Secondary | ICD-10-CM | POA: Insufficient documentation

## 2023-10-30 DIAGNOSIS — S01312A Laceration without foreign body of left ear, initial encounter: Secondary | ICD-10-CM

## 2023-10-30 DIAGNOSIS — R0902 Hypoxemia: Secondary | ICD-10-CM | POA: Diagnosis not present

## 2023-10-30 DIAGNOSIS — S52501A Unspecified fracture of the lower end of right radius, initial encounter for closed fracture: Secondary | ICD-10-CM | POA: Diagnosis not present

## 2023-10-30 DIAGNOSIS — M1712 Unilateral primary osteoarthritis, left knee: Secondary | ICD-10-CM | POA: Diagnosis not present

## 2023-10-30 DIAGNOSIS — S5291XA Unspecified fracture of right forearm, initial encounter for closed fracture: Secondary | ICD-10-CM | POA: Diagnosis present

## 2023-10-30 DIAGNOSIS — I959 Hypotension, unspecified: Secondary | ICD-10-CM | POA: Diagnosis not present

## 2023-10-30 MED ORDER — LIDOCAINE-EPINEPHRINE-TETRACAINE (LET) TOPICAL GEL
3.0000 mL | Freq: Once | TOPICAL | Status: AC
  Administered 2023-10-30: 3 mL via TOPICAL
  Filled 2023-10-30: qty 3

## 2023-10-30 MED ORDER — TRAMADOL HCL 50 MG PO TABS: 50.0000 mg | ORAL_TABLET | Freq: Four times a day (QID) | ORAL | 0 refills | Status: AC | PRN

## 2023-10-30 MED ORDER — TRAMADOL HCL 50 MG PO TABS
50.0000 mg | ORAL_TABLET | Freq: Once | ORAL | Status: AC
  Administered 2023-10-30: 50 mg via ORAL
  Filled 2023-10-30: qty 1

## 2023-10-30 MED ORDER — TETANUS-DIPHTH-ACELL PERTUSSIS 5-2.5-18.5 LF-MCG/0.5 IM SUSY
0.5000 mL | PREFILLED_SYRINGE | Freq: Once | INTRAMUSCULAR | Status: AC
  Administered 2023-10-30: 0.5 mL via INTRAMUSCULAR
  Filled 2023-10-30: qty 0.5

## 2023-10-30 NOTE — ED Notes (Signed)
 Attempted to call daughter to pick patient up. No answer x2.

## 2023-10-30 NOTE — ED Triage Notes (Signed)
 Pt is here by EMS after she tripped and fell outside.  She has pain in her right wrist, splint in place from ems and left knee, swelling and bruising noted and she struck her head, no LOC and pt is not on blood thinners, she states that she "saw stars" Pt is alert and oriented. She had fentanyl from ems. Pt has hx of copd and is on 3L home O2

## 2023-10-30 NOTE — ED Notes (Signed)
 3rd attempt to call daughter at this time without answer

## 2023-10-30 NOTE — Discharge Instructions (Addendum)
 Please follow-up with primary care provider in 1 week for suture removal to the left ear.  Follow-up with orthopedics for repeat evaluation of your right wrist fracture.  Do not remove splint, keep splint clean and dry until you follow-up with orthopedics.  Take Tylenol and tramadol as needed for pain

## 2023-10-30 NOTE — ED Notes (Signed)
Tech applying splint at this time.

## 2023-10-30 NOTE — ED Notes (Signed)
 Called [facility] @2202  to arrange transport home for patient. Per Pam at this time there is no truck available until 00:45 if then at all. Placed patient on list for pickup

## 2023-10-30 NOTE — ED Provider Notes (Signed)
  EMERGENCY DEPARTMENT AT [facility] Provider Note   CSN: [id number] Arrival date & time: 10/30/23  1645     History  Chief Complaint  Patient presents with   Audrey Sexton is a 73 y.o. female with past medical history of COPD, hypertension, GERD presents to the emergency department for evaluation of a fall.  Fall occurred just prior to arrival.  Patient states she tripped, fell landing on her right wrist, left knee and hit her head on the step.  She has a small laceration to the top of the left ear along the helix/helical crus.  Tetanus status unknown.  She denies any groin or thigh pain.  No neck pain.  No chest pain or shortness of breath.  No dizziness or lightheadedness.  She does complain of some aching pain in her lower back while being at the hospital.  No radicular symptoms in lower extremities.  She is on aspirin but no other blood thinners.  HPI     Home Medications Prior to Admission medications   Medication Sig Start Date End Date Taking? Authorizing Provider  traMADol (ULTRAM) 50 MG tablet Take 1 tablet (50 mg total) by mouth every 6 (six) hours as needed. 10/30/23  Yes Evon Slack, PA-C  albuterol (PROVENTIL HFA;VENTOLIN HFA) 108 (90 Base) MCG/ACT inhaler Inhale 2-4 puffs by mouth every 4 hours as needed for wheezing, cough, and/or shortness of breath 01/01/17   Loleta Rose, MD  Ascorbic Acid (VITAMIN C) 1000 MG tablet Take 1,000 mg by mouth daily.    [provider]  aspirin EC 81 MG EC tablet Take 1 tablet (81 mg total) by mouth daily. 09/12/18   Houston Siren, MD  atorvastatin (LIPITOR) 80 MG tablet Take 80 mg by mouth at bedtime. 06/30/22   [provider]  Cholecalciferol 25 MCG (1000 UT) tablet Take 1,000 Units by mouth daily.    [provider]  diltiazem (CARDIZEM CD) 300 MG 24 hr capsule Take 1 capsule (300 mg total) by mouth at bedtime. 02/08/20   Dhungel, Theda Belfast, MD  DULoxetine (CYMBALTA) 60 MG capsule  Take 60 mg by mouth every morning. 09/07/22   [provider]  fluticasone (FLONASE) 50 MCG/ACT nasal spray Place 1 spray into both nostrils daily as needed for allergies. 08/27/22   [provider]  fluticasone furoate-vilanterol (BREO ELLIPTA) 100-25 MCG/INH AEPB Inhale 1 puff into the lungs daily. 06/14/17   Milagros Loll, MD  guaiFENesin (MUCINEX) 600 MG 12 hr tablet Take 1 tablet (600 mg total) by mouth 2 (two) times daily as needed. 05/09/23   Leeroy Bock, MD  ipratropium-albuterol (DUONEB) 0.5-2.5 (3) MG/3ML SOLN Take 3 mLs by nebulization every 6 (six) hours as needed. 02/08/20   Dhungel, Nishant, MD  lisinopril (ZESTRIL) 20 MG tablet Take 20 mg by mouth daily. 06/30/22   [provider]  montelukast (SINGULAIR) 10 MG tablet Take 10 mg by mouth daily. 07/31/22   [provider]  Multiple Vitamin (MULTI-VITAMIN DAILY PO) Take 1 tablet by mouth daily.    [provider]  omeprazole (PRILOSEC) 20 MG capsule Take 20 mg by mouth daily.    [provider]  oxybutynin (DITROPAN XL) 15 MG 24 hr tablet Take 15 mg by mouth daily. 02/03/20   [provider]  potassium chloride SA (K-DUR,KLOR-CON) 20 MEQ tablet Take 1 tablet (20 mEq total) by mouth daily. 06/15/17   Milagros Loll, MD  umeclidinium bromide (INCRUSE ELLIPTA) 62.5  MCG/INH AEPB Inhale 1 puff into the lungs daily.    [provider]  vitamin B-12 (CYANOCOBALAMIN) 1000 MCG tablet Take 1,000 mcg by mouth daily.     [provider]      Allergies    Bee venom, Lactose intolerance (gi), Percocet [oxycodone-acetaminophen], Vicodin [hydrocodone-acetaminophen], and Penicillins    Review of Systems   Review of Systems  Physical Exam Updated Vital Signs BP (!) 133/52 (BP Location: Left Arm)   Pulse 83   Temp 98.1 F (36.7 C) (Oral)   Resp 16   SpO2 93%  Physical Exam Constitutional:      Appearance: She is well-developed.  HENT:     Head:  Normocephalic.     Comments: Small laceration to the top of the left ear along the helix/helical crus Eyes:     Conjunctiva/sclera: Conjunctivae normal.  Cardiovascular:     Rate and Rhythm: Normal rate.  Pulmonary:     Effort: Pulmonary effort is normal. No respiratory distress.  Musculoskeletal:        General: Normal range of motion.     Cervical back: Normal range of motion.     Comments: Mild swelling to the right wrist.  Splint is applied, no obvious deformity.  No skin breakdown noted.  She is tender along the distal radius but nontender along the thumb, carpals or phalanges.  Both hips with negative logrolling test.  I am able to logroll both hips with no pain or discomfort.  She has no tenderness palpation on the greater trochanteric bursa's bilaterally.  She is tender along the lower lumbar spinous process but no sacral or iliac crest tenderness.  Left knee with mild bruising anteriorly.  There is mild skin abrasions anteriorly along the patella and proximal tibia region.  She is able to straight leg raise and there is no defect in the quad tendon or patellar tendon.  Knee is stable to valgus varus stress testing.  Skin:    General: Skin is warm.     Findings: No rash.  Neurological:     Mental Status: She is alert and oriented to person, place, and time.  Psychiatric:        Behavior: Behavior normal.        Thought Content: Thought content normal.     ED Results / Procedures / Treatments   Labs (all labs ordered are listed, but only abnormal results are displayed) Labs Reviewed - No data to display  EKG None  Radiology DG Lumbar Spine 2-3 Views Result Date: 10/30/2023 CLINICAL DATA:  Low back pain.  Fall. EXAM: LUMBAR SPINE - 2-3 VIEW COMPARISON:  None Available. FINDINGS: Five non-rib-bearing lumbar vertebra. Slight broad-based levo scoliotic curvature. No fracture or compression deformity. Moderate multilevel degenerative disc disease most prominent at L4-L5 and  L5-S1. Moderate multilevel facet hypertrophy. Sacroiliac joints are congruent. IMPRESSION: 1. No acute fracture of the lumbar spine. 2. Multilevel degenerative change. Electronically Signed   By: Narda Rutherford M.D.   On: 10/30/2023 19:53   DG Wrist Complete Right Result Date: 10/30/2023 CLINICAL DATA:  Wrist pain after fall today. EXAM: RIGHT WRIST - COMPLETE 3+ VIEW COMPARISON:  None Available. FINDINGS: Impaction fracture of the distal radial metaphysis. There is mild apex volar angulation. Fracture extends to the distal radioulnar joint. No radiocarpal involvement. No definite ulnar styloid fracture. Carpal bones are intact. Generalized soft tissue edema. IMPRESSION: Acute impaction fracture of the distal radial metaphysis. Electronically Signed   By: Ivette Loyal.D.  On: 10/30/2023 19:51   CT Head Wo Contrast Result Date: 10/30/2023 CLINICAL DATA:  Head trauma, minor (Age >= 65y) EXAM: CT HEAD WITHOUT CONTRAST TECHNIQUE: Contiguous axial images were obtained from the base of the skull through the vertex without intravenous contrast. RADIATION DOSE REDUCTION: This exam was performed according to the departmental dose-optimization program which includes automated exposure control, adjustment of the mA and/or kV according to patient size and/or use of iterative reconstruction technique. COMPARISON:  None Available. FINDINGS: Brain: No evidence of acute infarction, hemorrhage, hydrocephalus, extra-axial collection or mass lesion/mass effect. Moderate patchy white matter hypodensities are nonspecific but compatible with chronic microvascular ischemic disease. Vascular: No hyperdense vessel. Skull: No acute fracture. Sinuses/Orbits: Clear sinuses.  No acute orbital findings. Other: No sizable mastoid effusions. IMPRESSION: 1. No evidence of acute intracranial abnormality. 2. Chronic microvascular ischemic disease. Electronically Signed   By: Feliberto Harts M.D.   On: 10/30/2023 19:51   DG Knee  Complete 4 Views Left Result Date: 10/30/2023 CLINICAL DATA:  Pain after fall. EXAM: LEFT KNEE - COMPLETE 4+ VIEW COMPARISON:  None Available. FINDINGS: The bones are subjectively under mineralized. No evidence of acute fracture or dislocation. Mild osteoarthritis with joint space narrowing and spurring. No significant knee joint effusion. No focal soft tissue abnormality. IMPRESSION: 1. No acute fracture or dislocation of the left knee. 2. Bony under mineralization. 3. Osteoarthritis. Electronically Signed   By: Narda Rutherford M.D.   On: 10/30/2023 19:50    Procedures .Laceration Repair  Date/Time: 10/30/2023 8:17 PM  Performed by: Evon Slack, PA-C Authorized by: Evon Slack, PA-C   Consent:    Consent obtained:  Verbal   Consent given by:  Patient Laceration details:    Location:  Ear   Ear location:  L ear   Length (cm):  3 Pre-procedure details:    Preparation:  Patient was prepped and draped in usual sterile fashion Exploration:    Wound exploration: wound explored through full range of motion     Contaminated: no   Treatment:    Area cleansed with:  Povidone-iodine and saline   Amount of cleaning:  Standard   Irrigation solution:  Sterile saline   Irrigation method:  Pressure wash Skin repair:    Repair method:  Sutures   Suture size:  6-0   Suture material:  Nylon   Suture technique:  Simple interrupted   Number of sutures:  6 Approximation:    Approximation:  Close Repair type:    Repair type:  Simple Post-procedure details:    Dressing:  Open (no dressing)     Medications Ordered in ED Medications  Tdap (BOOSTRIX) injection 0.5 mL (0.5 mLs Intramuscular Given 10/30/23 1813)  lidocaine-EPINEPHrine-tetracaine (LET) topical gel (3 mLs Topical Given 10/30/23 1812)  traMADol (ULTRAM) tablet 50 mg (50 mg Oral Given 10/30/23 1809)    ED Course/ Medical Decision Making/ A&P                                 Medical Decision Making Amount and/or Complexity of  Data Reviewed Radiology: ordered.  Risk Prescription drug management.   73 year old female with a fall earlier today.  She fell onto her right wrist, left knee and hit her head and suffered a left ear laceration.  Tetanus updated today.  CT of the head negative for any acute intracranial abnormality.  Right wrist x-ray shows minimally displaced distal radial metaphyseal fracture.  She is placed into a volar splint and educated on follow-up with orthopedics.  She is given Tylenol and tramadol for pain.  Pain well-controlled at time of discharge.  Left knee, lumbar spine x-rays negative for any acute bony abnormality.  She has no neurological deficits or ligamentous laxity or extensor mechanism loss in the lower extremities.  She is educated on follow-up and understands signs symptoms return to the ED for. Final Clinical Impression(s) / ED Diagnoses Final diagnoses:  Contusion of left knee, initial encounter  Complex laceration of left ear, initial encounter  Closed fracture of distal end of right radius, initial encounter  Acute midline low back pain without sciatica  Fall, initial encounter    Rx / DC Orders ED Discharge Orders          Ordered    traMADol (ULTRAM) 50 MG tablet  Every 6 hours PRN        10/30/23 2014              Ronnette Juniper 10/30/23 2019    Corena Herter, MD 10/31/23 0009

## 2023-10-31 MED ORDER — TRAMADOL HCL 50 MG PO TABS
50.0000 mg | ORAL_TABLET | Freq: Once | ORAL | Status: AC
  Administered 2023-10-31: 50 mg via ORAL
  Filled 2023-10-31: qty 1

## 2023-10-31 NOTE — ED Notes (Signed)
 Attempted to call pt's daughter, latoya, no answer at this time. VM left

## 2023-10-31 NOTE — ED Notes (Signed)
 This RN received report from [facility] RN and performed bedside care handoff. This RN introduced self to pt. Call light in reach, bed wheels locked, side rail raised, pt updated on plan of care. Rounding completed.

## 2023-10-31 NOTE — ED Notes (Signed)
 Attempted to call pt's daughter. No answer at this time.

## 2023-10-31 NOTE — ED Notes (Signed)
 Called pt's daughter, no answer at this time. VM left

## 2023-10-31 NOTE — ED Notes (Signed)
 Attempted to call latoya, pt's daughter, no answer at this time. VM left.

## 2023-10-31 NOTE — ED Notes (Signed)
 Pt moved to 5h.  Report off to [facility] RN

## 2023-10-31 NOTE — ED Notes (Signed)
 Writer attempted to call pt's daughter, Glee Arvin at this time with no answer. Voicemail left.

## 2023-10-31 NOTE — ED Notes (Addendum)
 Attempted to call pt's daughter, latoya. No answer at this time.

## 2023-11-04 DIAGNOSIS — J449 Chronic obstructive pulmonary disease, unspecified: Secondary | ICD-10-CM | POA: Diagnosis not present

## 2023-11-07 DIAGNOSIS — S8002XA Contusion of left knee, initial encounter: Secondary | ICD-10-CM | POA: Diagnosis not present

## 2023-11-07 DIAGNOSIS — S52501A Unspecified fracture of the lower end of right radius, initial encounter for closed fracture: Secondary | ICD-10-CM | POA: Diagnosis not present

## 2023-11-07 DIAGNOSIS — Z4802 Encounter for removal of sutures: Secondary | ICD-10-CM | POA: Diagnosis not present

## 2023-11-07 DIAGNOSIS — S01312A Laceration without foreign body of left ear, initial encounter: Secondary | ICD-10-CM | POA: Diagnosis not present

## 2023-11-19 ENCOUNTER — Institutional Professional Consult (permissible substitution): Admitting: Pulmonary Disease

## 2023-11-23 DIAGNOSIS — S52501A Unspecified fracture of the lower end of right radius, initial encounter for closed fracture: Secondary | ICD-10-CM | POA: Diagnosis not present

## 2023-11-23 DIAGNOSIS — M25431 Effusion, right wrist: Secondary | ICD-10-CM | POA: Diagnosis not present

## 2023-11-23 DIAGNOSIS — M25531 Pain in right wrist: Secondary | ICD-10-CM | POA: Diagnosis not present

## 2023-12-05 DIAGNOSIS — J449 Chronic obstructive pulmonary disease, unspecified: Secondary | ICD-10-CM | POA: Diagnosis not present

## 2023-12-13 DIAGNOSIS — S52501A Unspecified fracture of the lower end of right radius, initial encounter for closed fracture: Secondary | ICD-10-CM | POA: Diagnosis not present

## 2023-12-13 DIAGNOSIS — S52614A Nondisplaced fracture of right ulna styloid process, initial encounter for closed fracture: Secondary | ICD-10-CM | POA: Diagnosis not present

## 2024-01-04 DIAGNOSIS — J449 Chronic obstructive pulmonary disease, unspecified: Secondary | ICD-10-CM | POA: Diagnosis not present

## 2024-02-04 DIAGNOSIS — J449 Chronic obstructive pulmonary disease, unspecified: Secondary | ICD-10-CM | POA: Diagnosis not present

## 2024-02-05 ENCOUNTER — Other Ambulatory Visit: Payer: Self-pay | Admitting: Family Medicine

## 2024-02-05 DIAGNOSIS — Z1231 Encounter for screening mammogram for malignant neoplasm of breast: Secondary | ICD-10-CM

## 2024-03-05 DIAGNOSIS — J449 Chronic obstructive pulmonary disease, unspecified: Secondary | ICD-10-CM | POA: Diagnosis not present

## 2024-04-01 DIAGNOSIS — R911 Solitary pulmonary nodule: Secondary | ICD-10-CM | POA: Diagnosis not present

## 2024-04-01 DIAGNOSIS — I471 Supraventricular tachycardia, unspecified: Secondary | ICD-10-CM | POA: Diagnosis not present

## 2024-04-01 DIAGNOSIS — R7303 Prediabetes: Secondary | ICD-10-CM | POA: Diagnosis not present

## 2024-04-01 DIAGNOSIS — K219 Gastro-esophageal reflux disease without esophagitis: Secondary | ICD-10-CM | POA: Diagnosis not present

## 2024-04-01 DIAGNOSIS — N1831 Chronic kidney disease, stage 3a: Secondary | ICD-10-CM | POA: Diagnosis not present

## 2024-04-01 DIAGNOSIS — H9190 Unspecified hearing loss, unspecified ear: Secondary | ICD-10-CM | POA: Diagnosis not present

## 2024-04-01 DIAGNOSIS — J9611 Chronic respiratory failure with hypoxia: Secondary | ICD-10-CM | POA: Diagnosis not present

## 2024-04-01 DIAGNOSIS — Z1159 Encounter for screening for other viral diseases: Secondary | ICD-10-CM | POA: Diagnosis not present

## 2024-04-01 DIAGNOSIS — E785 Hyperlipidemia, unspecified: Secondary | ICD-10-CM | POA: Diagnosis not present

## 2024-04-01 DIAGNOSIS — J449 Chronic obstructive pulmonary disease, unspecified: Secondary | ICD-10-CM | POA: Diagnosis not present

## 2024-04-01 DIAGNOSIS — R942 Abnormal results of pulmonary function studies: Secondary | ICD-10-CM | POA: Diagnosis not present

## 2024-04-01 DIAGNOSIS — I10 Essential (primary) hypertension: Secondary | ICD-10-CM | POA: Diagnosis not present

## 2024-04-03 ENCOUNTER — Ambulatory Visit
Admission: RE | Admit: 2024-04-03 | Discharge: 2024-04-03 | Disposition: A | Discharge status: Home / Self Care | Source: Ambulatory Visit | Attending: Family Medicine | Admitting: Family Medicine

## 2024-04-03 DIAGNOSIS — M8589 Other specified disorders of bone density and structure, multiple sites: Secondary | ICD-10-CM | POA: Diagnosis not present

## 2024-04-03 DIAGNOSIS — Z1231 Encounter for screening mammogram for malignant neoplasm of breast: Secondary | ICD-10-CM | POA: Diagnosis not present

## 2024-04-03 DIAGNOSIS — Z780 Asymptomatic menopausal state: Secondary | ICD-10-CM | POA: Diagnosis not present

## 2024-04-03 DIAGNOSIS — E2839 Other primary ovarian failure: Secondary | ICD-10-CM | POA: Diagnosis present

## 2024-04-05 DIAGNOSIS — J449 Chronic obstructive pulmonary disease, unspecified: Secondary | ICD-10-CM | POA: Diagnosis not present

## 2024-04-11 ENCOUNTER — Telehealth: Payer: Self-pay | Admitting: Internal Medicine

## 2024-04-11 NOTE — Telephone Encounter (Signed)
 Needs a nodule visit.  Please add Dr. Isaiah to the provider list when scheduling

## 2024-04-22 ENCOUNTER — Ambulatory Visit: Admitting: Internal Medicine

## 2024-04-30 ENCOUNTER — Telehealth: Payer: Self-pay

## 2024-04-30 ENCOUNTER — Encounter: Payer: Self-pay | Admitting: Student in an Organized Health Care Education/Training Program

## 2024-04-30 ENCOUNTER — Ambulatory Visit (INDEPENDENT_AMBULATORY_CARE_PROVIDER_SITE_OTHER): Admitting: Student in an Organized Health Care Education/Training Program

## 2024-04-30 VITALS — BP 130/70 | HR 84 | Temp 97.4°F | Ht 64.0 in | Wt 184.0 lb

## 2024-04-30 DIAGNOSIS — J42 Unspecified chronic bronchitis: Secondary | ICD-10-CM

## 2024-04-30 DIAGNOSIS — R911 Solitary pulmonary nodule: Secondary | ICD-10-CM | POA: Insufficient documentation

## 2024-04-30 MED ORDER — AZITHROMYCIN 250 MG PO TABS: ORAL_TABLET | ORAL | 0 refills | Status: AC

## 2024-04-30 NOTE — Telephone Encounter (Signed)
 Noted. Nothing further needed.

## 2024-04-30 NOTE — H&P (View-Only) (Signed)
 Assessment & Plan:   1. Lung nodule (Primary)  Nodule Location: LUL Nodule Size: 17 mm Nodule Spiculation: No Associated Lymphadenopathy: No Smoking Status (former) and pack years: 40 Extrathoracic cancer > 5 years prior (no) SPN malignancy risk score [facility]): 45.2 %risk of malignancy ECOG: 2  The patient is here to discuss their imaging abnormalities which include an FDG avid left upper lobe pulmonary nodule which in the setting of her smoking history is highly concerning for malignancy.  Patient unfortunately had her initial set of imaging studies in January 2025 and with the 57-month delay, stage shift is possible.  Given this, I will repeat her CT scan as well as her PET/CT in preparation for a biopsy.  Patient is furthermore experiencing shortness of breath and some wheezing which could suggest somewhat poorly controlled airway disease which will need to be treated before we proceed with any biopsy procedure  We discussed the importance of diagnosis and staging in lung malignancies, and the approach to obtaining a tissue diagnosis which would include robotic assisted navigational bronchoscopy with endobronchial ultrasound guided sampling.  We also discussed the risks associated with the procedure which include a 2% risk of pneumothorax, infection, bleeding, and nondiagnostic procedure in detail.  I explained that patients typically are able to return home the same day of the procedure, but in rare cases admission to the hospital for observation and treatment is required.  After our discussion, the patient elected to proceed with the procedure  Recommendations:  - CT SUPER D CHEST WO CONTRAST; Future - Procedural/ Surgical Case Request: VIDEO BRONCHOSCOPY WITH ENDOBRONCHIAL NAVIGATION; Future - NM PET Image Restage (PS) Skull Base to Thigh (F-18 FDG); Future  2. Chronic bronchitis, unspecified chronic bronchitis type (HCC)  She has a history of airway disease followed  closely by PCP.  She is maintained on ICS/LABA (Breo) and LAMA (Incruse) which is optimal therapy for her.  She does have a CBC from 2015 with an eosinophil count of 1100 which could suggest asthma or an asthma/COPD overlap syndrome given her smoking history and previous work in [employer].  I will obtain full PFTs and give her a course of azithromycin  to treat any COPD exacerbation. I did offer her a course of prednisone  but she would prefer not to as it leads to uncontrollable sugars. I will order PFT's today, and should they suggest significant reversibility, I will either step up her ICS dose or consider Biologics.  - azithromycin  (ZITHROMAX ) 250 MG tablet; Take 2 tablets (500 mg) on  Day 1,  followed by 1 tablet (250 mg) once daily on Days 2 through 5.  Dispense: 6 each; Refill: 0 - Obtain full PFT's - Continue Breo and Incruse - Continue Montelukast    I spent 63 minutes caring for this patient today, including preparing to see the patient, obtaining a medical history , reviewing a separately obtained history, performing a medically appropriate examination and/or evaluation, counseling and educating the patient/family/caregiver, ordering medications, tests, or procedures, documenting clinical information in the electronic health record, and independently interpreting results (not separately reported/billed) and communicating results to the patient/family/caregiver  Belva November, MD [facility]   End of visit medications:  Meds ordered this encounter  Medications   azithromycin  (ZITHROMAX ) 250 MG tablet    Sig: Take 2 tablets (500 mg) on  Day 1,  followed by 1 tablet (250 mg) once daily on Days 2 through 5.    Dispense:  6 each  Refill:  0     Current Outpatient Medications:    albuterol  (PROVENTIL  HFA;VENTOLIN  HFA) 108 (90 Base) MCG/ACT inhaler, Inhale 2-4 puffs by mouth every 4 hours as needed for wheezing, cough, and/or shortness of breath, Disp: 1  Inhaler, Rfl: 1   alendronate (FOSAMAX) 70 MG tablet, Take 70 mg by mouth once a week., Disp: , Rfl:    aspirin  EC 81 MG EC tablet, Take 1 tablet (81 mg total) by mouth daily., Disp: , Rfl:    atorvastatin  (LIPITOR) 80 MG tablet, Take 80 mg by mouth at bedtime., Disp: , Rfl:    azithromycin  (ZITHROMAX ) 250 MG tablet, Take 2 tablets (500 mg) on  Day 1,  followed by 1 tablet (250 mg) once daily on Days 2 through 5., Disp: 6 each, Rfl: 0   Cholecalciferol  25 MCG (1000 UT) tablet, Take 1,000 Units by mouth daily., Disp: , Rfl:    diltiazem  (CARDIZEM  CD) 300 MG 24 hr capsule, Take 1 capsule (300 mg total) by mouth at bedtime., Disp: 30 capsule, Rfl: 1   DULoxetine  (CYMBALTA ) 60 MG capsule, Take 60 mg by mouth every morning., Disp: , Rfl:    ezetimibe (ZETIA) 10 MG tablet, Take 10 mg by mouth daily., Disp: , Rfl:    famotidine  (PEPCID ) 40 MG tablet, Take 40 mg by mouth daily., Disp: , Rfl:    fluticasone  (FLONASE ) 50 MCG/ACT nasal spray, Place 1 spray into both nostrils daily as needed for allergies., Disp: , Rfl:    fluticasone  furoate-vilanterol (BREO ELLIPTA ) 100-25 MCG/INH AEPB, Inhale 1 puff into the lungs daily., Disp: , Rfl:    guaiFENesin  (MUCINEX ) 600 MG 12 hr tablet, Take 1 tablet (600 mg total) by mouth 2 (two) times daily as needed., Disp: , Rfl:    hydrocortisone 2.5 % cream, Apply 1 Application topically 3 (three) times daily., Disp: , Rfl:    ipratropium-albuterol  (DUONEB) 0.5-2.5 (3) MG/3ML SOLN, Take 3 mLs by nebulization every 6 (six) hours as needed., Disp: 360 mL, Rfl: 1   lisinopril  (ZESTRIL ) 20 MG tablet, Take 20 mg by mouth daily., Disp: , Rfl:    montelukast  (SINGULAIR ) 10 MG tablet, Take 10 mg by mouth daily., Disp: , Rfl:    Multiple Vitamin (MULTI-VITAMIN DAILY PO), Take 1 tablet by mouth daily., Disp: , Rfl:    omeprazole (PRILOSEC) 20 MG capsule, Take 20 mg by mouth daily., Disp: , Rfl:    oxybutynin  (DITROPAN  XL) 15 MG 24 hr tablet, Take 15 mg by mouth daily., Disp: , Rfl:     potassium chloride  SA (K-DUR,KLOR-CON ) 20 MEQ tablet, Take 1 tablet (20 mEq total) by mouth daily., Disp: 15 tablet, Rfl: 0   traMADol  (ULTRAM ) 50 MG tablet, Take 1 tablet (50 mg total) by mouth every 6 (six) hours as needed., Disp: 5 tablet, Rfl: 0   umeclidinium bromide  (INCRUSE ELLIPTA ) 62.5 MCG/INH AEPB, Inhale 1 puff into the lungs daily., Disp: , Rfl:    vitamin B-12 (CYANOCOBALAMIN ) 1000 MCG tablet, Take 1,000 mcg by mouth daily. , Disp: , Rfl:    Ascorbic Acid  (VITAMIN C ) 1000 MG tablet, Take 1,000 mg by mouth daily. (Patient not taking: Reported on 04/30/2024), Disp: , Rfl:    Subjective:   PATIENT ID: Audrey Sexton GENDER: female DOB: 09-25-1950, MRN: [id number]  Chief Complaint  Patient presents with   Lung Mass    Nodule. DOE. Wheezing. Cough. Has had a cold for a few days. Having increased SOB.    HPI  Patient is a  73 year old female with a history of COPD presenting for the evaluation of a pulmonary nodule.  Patient has a longstanding history of airway disease thought to be COPD secondary to tobacco abuse.  She is followed closely by her primary care provider and is maintained on Breo Ellipta  and Incruse Ellipta  for triple therapy.  Her last exacerbation was in September 2024.  As part of her general preventative care, she underwent a low-dose CT for lung cancer screening in January 2025 which was notable for a left upper lobe pulmonary nodule. This was further evaluated with a PET/CT showing the nodule to be FDG avid with an SUV of 9.7.  Patient was referred to pulmonary but her evaluation was delayed after she suffered multiple falls.  She was finally able to schedule an appointment and presents to day to establish care.  Today, patient presents to clinic on oxygen  therapy.  She is visibly short of breath with exertion.  She does feel a little more short of breath than her baseline.  She does report a chronic cough and chronic wheezing.  She does not have any change in the  amount or nature of her sputum production.  She has not had any fevers or chills recently nor does she report any signs of an upper respiratory tract infection.  Patient reports a longstanding history of smoking, having quit in 2010.  She has at least 40 pack years of smoking history.  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   Ancillary information including prior medications, full medical/surgical/family/social histories, and PFTs (when available) are listed below and have been reviewed.   Review of Systems  Constitutional:  Negative for chills, fever and weight loss.  Respiratory:  Positive for cough, shortness of breath and wheezing. Negative for hemoptysis and sputum production.   Cardiovascular:  Negative for chest pain.     Objective:   Vitals:   04/30/24 1134 04/30/24 1135  BP:  130/70  Pulse:  84  Temp:  (!) 97.4 F (36.3 C)  SpO2: (!) 80% 91%  Weight:  184 lb (83.5 kg)  Height:  5' 4 (1.626 m)   91% on 3 LPM  BMI Readings from Last 3 Encounters:  04/30/24 31.58 kg/m  05/08/23 32.70 kg/m  09/15/22 33.64 kg/m   Wt Readings from Last 3 Encounters:  04/30/24 184 lb (83.5 kg)  05/08/23 190 lb 7.6 oz (86.4 kg)  09/15/22 196 lb (88.9 kg)    Physical Exam Constitutional:      Appearance: Normal appearance.  Cardiovascular:     Rate and Rhythm: Normal rate and regular rhythm.     Pulses: Normal pulses.     Heart sounds: Normal heart sounds.  Pulmonary:     Effort: No respiratory distress.     Breath sounds: Wheezing present.  Neurological:     General: No focal deficit present.     Mental Status: She is alert and oriented to person, place, and time. Mental status is at baseline.       Ancillary Information    Past Medical History:  Diagnosis Date   Anemia    Asthma    Carpal tunnel syndrome of left wrist    COPD (chronic obstructive pulmonary disease) (HCC)    History of kidney stones    Hyperlipemia    Hypertension    Stroke  [facility])    Jan. 2020     Family History  Problem Relation Age of Onset   Heart disease Mother  died at 5   Diabetes Mother    Alzheimer's disease Father        died at 15   Parkinson's disease Sister    Heart disease Brother        died at 50   Diabetes Brother    Kidney disease Sister    Breast cancer Sister      Past Surgical History:  Procedure Laterality Date   CARPAL TUNNEL RELEASE Left    COLONOSCOPY N/A 09/07/2020   Procedure: COLONOSCOPY;  Surgeon: Maryruth Ole DASEN, MD;  Location: [facility] ENDOSCOPY;  Service: Endoscopy;  Laterality: N/A;   COLONOSCOPY WITH PROPOFOL  N/A 09/16/2022   Procedure: COLONOSCOPY WITH PROPOFOL ;  Surgeon: Therisa Bi, MD;  Location: [facility] ENDOSCOPY;  Service: Endoscopy;  Laterality: N/A;   ESOPHAGOGASTRODUODENOSCOPY (EGD) WITH PROPOFOL  N/A 09/15/2022   Procedure: ESOPHAGOGASTRODUODENOSCOPY (EGD) WITH PROPOFOL ;  Surgeon: Onita Elspeth Sharper, DO;  Location: [facility] ENDOSCOPY;  Service: Gastroenterology;  Laterality: N/A;   fractured tibia Left    repair   ROTATOR CUFF REPAIR Left    TEE WITHOUT CARDIOVERSION N/A 09/09/2018   Procedure: TRANSESOPHAGEAL ECHOCARDIOGRAM (TEE);  Surgeon: Bosie Vinie LABOR, MD;  Location: [facility] ORS;  Service: Cardiovascular;  Laterality: N/A;   TUBAL LIGATION      Social History   Socioeconomic History   Marital status: Widowed    Spouse name: Not on file   Number of children: Not on file   Years of education: Not on file   Highest education level: Not on file  Occupational History   Occupation: retired     Comment: Conservation officer, nature    Comment: school bus driver  Tobacco Use   Smoking status: Former    Current packs/day: 0.00    Average packs/day: 1 pack/day for 40.0 years (40.0 ttl pk-yrs)    Types: Cigarettes    Quit date: 2010    Years since quitting: 15.6   Smokeless tobacco: Never  Vaping Use   Vaping status: Never Used  Substance and Sexual Activity   Alcohol use: No   Drug use: No   Sexual activity: Not  on file  Other Topics Concern   Not on file  Social History Narrative   Not on file   Social Drivers of Health   Financial Resource Strain: Medium Risk (11/23/2023)   Received from [facility]   Overall Financial Resource Strain (CARDIA)    Difficulty of Paying Living Expenses: Somewhat hard  Food Insecurity: No Food Insecurity (11/23/2023)   Received from [facility]   Hunger Vital Sign    Within the past 12 months, you worried that your food would run out before you got the money to buy more.: Never true    Within the past 12 months, the food you bought just didn't last and you didn't have money to get more.: Never true  Transportation Needs: No Transportation Needs (11/23/2023)   Received from [facility] - Transportation    In the past 12 months, has lack of transportation kept you from medical appointments or from getting medications?: No    Lack of Transportation (Non-Medical): No  Physical Activity: Unknown (09/09/2018)   Exercise Vital Sign    Days of Exercise per Week: 0 days    Minutes of Exercise per Session: Not on file  Stress: Not on file  Social Connections: Unknown (09/09/2018)   Social Connection and Isolation Panel    Frequency of Communication with Friends and Family: More than  three times a week    Frequency of Social Gatherings with Friends and Family: Once a week    Attends Religious Services: Never    Database administrator or Organizations: Not on file    Attends Banker Meetings: Not on file    Marital Status: Widowed  Intimate Partner Violence: Not At Risk (09/15/2022)   Humiliation, Afraid, Rape, and Kick questionnaire    Fear of Current or Ex-Partner: No    Emotionally Abused: No    Physically Abused: No    Sexually Abused: No     Allergies  Allergen Reactions   Bee Venom Shortness Of Breath and Swelling   Hydrocodone-Acetaminophen  Nausea And Vomiting   Lactose Diarrhea    Lactose Intolerance (Gi) Diarrhea   Percocet [Oxycodone -Acetaminophen ] Nausea And Vomiting   Vicodin [Hydrocodone-Acetaminophen ] Nausea And Vomiting   Penicillins Hives    Has patient had a PCN reaction causing immediate rash, facial/tongue/throat swelling, SOB or lightheadedness with hypotension: no  Has patient had a PCN reaction causing severe rash involving mucus membranes or skin necrosis: no  Has patient had a PCN reaction that required hospitalization  no  Has patient had a PCN reaction occurring within the last 10 years: no  If all of the above answers are NO, then may proceed with Cephalosporin use.  Has patient had a PCN reaction causing immediate rash, facial/tongue/throat swelling, SOB or lightheadedness with hypotension: no Has patient had a PCN reaction causing severe rash involving mucus membranes or skin necrosis: no Has patient had a PCN reaction that required hospitalization  no Has patient had a PCN reaction occurring within the last 10 years: no If all of the above answers are NO, then may proceed with Cephalosporin use.     CBC    Component Value Date/Time   WBC 8.2 05/05/2023 0104   RBC 4.22 05/05/2023 0104   HGB 12.2 05/05/2023 0104   HGB 9.8 (L) 06/24/2014 0357   HCT 37.9 05/05/2023 0104   HCT 30.3 (L) 06/24/2014 0357   PLT 239 05/05/2023 0104   PLT 319 06/24/2014 0357   MCV 89.8 05/05/2023 0104   MCV 85 06/24/2014 0357   MCH 28.9 05/05/2023 0104   MCHC 32.2 05/05/2023 0104   RDW 14.0 05/05/2023 0104   RDW 17.7 (H) 06/24/2014 0357   LYMPHSABS 1.9 09/14/2022 2023   LYMPHSABS 2.3 06/24/2014 0357   MONOABS 1.9 (H) 09/14/2022 2023   MONOABS 1.5 (H) 06/24/2014 0357   EOSABS 0.1 09/14/2022 2023   EOSABS 0.5 06/24/2014 0357   BASOSABS 0.1 09/14/2022 2023   BASOSABS 0.1 06/24/2014 0357    Pulmonary Functions Testing Results:     No data to display          Outpatient Medications Prior to Visit  Medication Sig Dispense Refill   albuterol   (PROVENTIL  HFA;VENTOLIN  HFA) 108 (90 Base) MCG/ACT inhaler Inhale 2-4 puffs by mouth every 4 hours as needed for wheezing, cough, and/or shortness of breath 1 Inhaler 1   alendronate (FOSAMAX) 70 MG tablet Take 70 mg by mouth once a week.     aspirin  EC 81 MG EC tablet Take 1 tablet (81 mg total) by mouth daily.     atorvastatin  (LIPITOR) 80 MG tablet Take 80 mg by mouth at bedtime.     Cholecalciferol  25 MCG (1000 UT) tablet Take 1,000 Units by mouth daily.     diltiazem  (CARDIZEM  CD) 300 MG 24 hr capsule Take 1 capsule (300 mg total) by mouth at  bedtime. 30 capsule 1   DULoxetine  (CYMBALTA ) 60 MG capsule Take 60 mg by mouth every morning.     ezetimibe (ZETIA) 10 MG tablet Take 10 mg by mouth daily.     famotidine  (PEPCID ) 40 MG tablet Take 40 mg by mouth daily.     fluticasone  (FLONASE ) 50 MCG/ACT nasal spray Place 1 spray into both nostrils daily as needed for allergies.     fluticasone  furoate-vilanterol (BREO ELLIPTA ) 100-25 MCG/INH AEPB Inhale 1 puff into the lungs daily.     guaiFENesin  (MUCINEX ) 600 MG 12 hr tablet Take 1 tablet (600 mg total) by mouth 2 (two) times daily as needed.     hydrocortisone 2.5 % cream Apply 1 Application topically 3 (three) times daily.     ipratropium-albuterol  (DUONEB) 0.5-2.5 (3) MG/3ML SOLN Take 3 mLs by nebulization every 6 (six) hours as needed. 360 mL 1   lisinopril  (ZESTRIL ) 20 MG tablet Take 20 mg by mouth daily.     montelukast  (SINGULAIR ) 10 MG tablet Take 10 mg by mouth daily.     Multiple Vitamin (MULTI-VITAMIN DAILY PO) Take 1 tablet by mouth daily.     omeprazole (PRILOSEC) 20 MG capsule Take 20 mg by mouth daily.     oxybutynin  (DITROPAN  XL) 15 MG 24 hr tablet Take 15 mg by mouth daily.     potassium chloride  SA (K-DUR,KLOR-CON ) 20 MEQ tablet Take 1 tablet (20 mEq total) by mouth daily. 15 tablet 0   traMADol  (ULTRAM ) 50 MG tablet Take 1 tablet (50 mg total) by mouth every 6 (six) hours as needed. 5 tablet 0   umeclidinium bromide  (INCRUSE  ELLIPTA) 62.5 MCG/INH AEPB Inhale 1 puff into the lungs daily.     vitamin B-12 (CYANOCOBALAMIN ) 1000 MCG tablet Take 1,000 mcg by mouth daily.      Ascorbic Acid  (VITAMIN C ) 1000 MG tablet Take 1,000 mg by mouth daily. (Patient not taking: Reported on 04/30/2024)     No facility-administered medications prior to visit.

## 2024-04-30 NOTE — Patient Instructions (Signed)
 We will get a repeat CT scan of your chest as well as a repeat PET scan to evaluate your lung nodule. We will then use this to plan your procedure. We will schedule you for bronchoscopy to biopsy the lung nodule.  I will prescribe you a course of antibiotics today given your wheezing. Continue to use your oxygen  therapy as indicated.

## 2024-04-30 NOTE — Telephone Encounter (Signed)
 Robotic Bronch with EBUS 05/20/2024 at 9:30am Lung nodule [id number], [id number], [id number]  Donzell please see bronch info.   Patient is aware of date and time. Bronch email has been sent.

## 2024-04-30 NOTE — Progress Notes (Signed)
 Assessment & Plan:   1. Lung nodule (Primary)  Nodule Location: LUL Nodule Size: 17 mm Nodule Spiculation: No Associated Lymphadenopathy: No Smoking Status (former) and pack years: 40 Extrathoracic cancer > 5 years prior (no) SPN malignancy risk score [facility]): 45.2 %risk of malignancy ECOG: 2  The patient is here to discuss their imaging abnormalities which include an FDG avid left upper lobe pulmonary nodule which in the setting of her smoking history is highly concerning for malignancy.  Patient unfortunately had her initial set of imaging studies in January 2025 and with the 57-month delay, stage shift is possible.  Given this, I will repeat her CT scan as well as her PET/CT in preparation for a biopsy.  Patient is furthermore experiencing shortness of breath and some wheezing which could suggest somewhat poorly controlled airway disease which will need to be treated before we proceed with any biopsy procedure  We discussed the importance of diagnosis and staging in lung malignancies, and the approach to obtaining a tissue diagnosis which would include robotic assisted navigational bronchoscopy with endobronchial ultrasound guided sampling.  We also discussed the risks associated with the procedure which include a 2% risk of pneumothorax, infection, bleeding, and nondiagnostic procedure in detail.  I explained that patients typically are able to return home the same day of the procedure, but in rare cases admission to the hospital for observation and treatment is required.  After our discussion, the patient elected to proceed with the procedure  Recommendations:  - CT SUPER D CHEST WO CONTRAST; Future - Procedural/ Surgical Case Request: VIDEO BRONCHOSCOPY WITH ENDOBRONCHIAL NAVIGATION; Future - NM PET Image Restage (PS) Skull Base to Thigh (F-18 FDG); Future  2. Chronic bronchitis, unspecified chronic bronchitis type (HCC)  She has a history of airway disease followed  closely by PCP.  She is maintained on ICS/LABA (Breo) and LAMA (Incruse) which is optimal therapy for her.  She does have a CBC from 2015 with an eosinophil count of 1100 which could suggest asthma or an asthma/COPD overlap syndrome given her smoking history and previous work in [employer].  I will obtain full PFTs and give her a course of azithromycin  to treat any COPD exacerbation. I did offer her a course of prednisone  but she would prefer not to as it leads to uncontrollable sugars. I will order PFT's today, and should they suggest significant reversibility, I will either step up her ICS dose or consider Biologics.  - azithromycin  (ZITHROMAX ) 250 MG tablet; Take 2 tablets (500 mg) on  Day 1,  followed by 1 tablet (250 mg) once daily on Days 2 through 5.  Dispense: 6 each; Refill: 0 - Obtain full PFT's - Continue Breo and Incruse - Continue Montelukast    I spent 63 minutes caring for this patient today, including preparing to see the patient, obtaining a medical history , reviewing a separately obtained history, performing a medically appropriate examination and/or evaluation, counseling and educating the patient/family/caregiver, ordering medications, tests, or procedures, documenting clinical information in the electronic health record, and independently interpreting results (not separately reported/billed) and communicating results to the patient/family/caregiver  Belva November, MD [facility]   End of visit medications:  Meds ordered this encounter  Medications   azithromycin  (ZITHROMAX ) 250 MG tablet    Sig: Take 2 tablets (500 mg) on  Day 1,  followed by 1 tablet (250 mg) once daily on Days 2 through 5.    Dispense:  6 each  Refill:  0     Current Outpatient Medications:    albuterol  (PROVENTIL  HFA;VENTOLIN  HFA) 108 (90 Base) MCG/ACT inhaler, Inhale 2-4 puffs by mouth every 4 hours as needed for wheezing, cough, and/or shortness of breath, Disp: 1  Inhaler, Rfl: 1   alendronate (FOSAMAX) 70 MG tablet, Take 70 mg by mouth once a week., Disp: , Rfl:    aspirin  EC 81 MG EC tablet, Take 1 tablet (81 mg total) by mouth daily., Disp: , Rfl:    atorvastatin  (LIPITOR) 80 MG tablet, Take 80 mg by mouth at bedtime., Disp: , Rfl:    azithromycin  (ZITHROMAX ) 250 MG tablet, Take 2 tablets (500 mg) on  Day 1,  followed by 1 tablet (250 mg) once daily on Days 2 through 5., Disp: 6 each, Rfl: 0   Cholecalciferol  25 MCG (1000 UT) tablet, Take 1,000 Units by mouth daily., Disp: , Rfl:    diltiazem  (CARDIZEM  CD) 300 MG 24 hr capsule, Take 1 capsule (300 mg total) by mouth at bedtime., Disp: 30 capsule, Rfl: 1   DULoxetine  (CYMBALTA ) 60 MG capsule, Take 60 mg by mouth every morning., Disp: , Rfl:    ezetimibe (ZETIA) 10 MG tablet, Take 10 mg by mouth daily., Disp: , Rfl:    famotidine  (PEPCID ) 40 MG tablet, Take 40 mg by mouth daily., Disp: , Rfl:    fluticasone  (FLONASE ) 50 MCG/ACT nasal spray, Place 1 spray into both nostrils daily as needed for allergies., Disp: , Rfl:    fluticasone  furoate-vilanterol (BREO ELLIPTA ) 100-25 MCG/INH AEPB, Inhale 1 puff into the lungs daily., Disp: , Rfl:    guaiFENesin  (MUCINEX ) 600 MG 12 hr tablet, Take 1 tablet (600 mg total) by mouth 2 (two) times daily as needed., Disp: , Rfl:    hydrocortisone 2.5 % cream, Apply 1 Application topically 3 (three) times daily., Disp: , Rfl:    ipratropium-albuterol  (DUONEB) 0.5-2.5 (3) MG/3ML SOLN, Take 3 mLs by nebulization every 6 (six) hours as needed., Disp: 360 mL, Rfl: 1   lisinopril  (ZESTRIL ) 20 MG tablet, Take 20 mg by mouth daily., Disp: , Rfl:    montelukast  (SINGULAIR ) 10 MG tablet, Take 10 mg by mouth daily., Disp: , Rfl:    Multiple Vitamin (MULTI-VITAMIN DAILY PO), Take 1 tablet by mouth daily., Disp: , Rfl:    omeprazole (PRILOSEC) 20 MG capsule, Take 20 mg by mouth daily., Disp: , Rfl:    oxybutynin  (DITROPAN  XL) 15 MG 24 hr tablet, Take 15 mg by mouth daily., Disp: , Rfl:     potassium chloride  SA (K-DUR,KLOR-CON ) 20 MEQ tablet, Take 1 tablet (20 mEq total) by mouth daily., Disp: 15 tablet, Rfl: 0   traMADol  (ULTRAM ) 50 MG tablet, Take 1 tablet (50 mg total) by mouth every 6 (six) hours as needed., Disp: 5 tablet, Rfl: 0   umeclidinium bromide  (INCRUSE ELLIPTA ) 62.5 MCG/INH AEPB, Inhale 1 puff into the lungs daily., Disp: , Rfl:    vitamin B-12 (CYANOCOBALAMIN ) 1000 MCG tablet, Take 1,000 mcg by mouth daily. , Disp: , Rfl:    Ascorbic Acid  (VITAMIN C ) 1000 MG tablet, Take 1,000 mg by mouth daily. (Patient not taking: Reported on 04/30/2024), Disp: , Rfl:    Subjective:   PATIENT ID: Audrey Sexton GENDER: female DOB: 09-25-1950, MRN: [id number]  Chief Complaint  Patient presents with   Lung Mass    Nodule. DOE. Wheezing. Cough. Has had a cold for a few days. Having increased SOB.    HPI  Patient is a  73 year old female with a history of COPD presenting for the evaluation of a pulmonary nodule.  Patient has a longstanding history of airway disease thought to be COPD secondary to tobacco abuse.  She is followed closely by her primary care provider and is maintained on Breo Ellipta  and Incruse Ellipta  for triple therapy.  Her last exacerbation was in September 2024.  As part of her general preventative care, she underwent a low-dose CT for lung cancer screening in January 2025 which was notable for a left upper lobe pulmonary nodule. This was further evaluated with a PET/CT showing the nodule to be FDG avid with an SUV of 9.7.  Patient was referred to pulmonary but her evaluation was delayed after she suffered multiple falls.  She was finally able to schedule an appointment and presents to day to establish care.  Today, patient presents to clinic on oxygen  therapy.  She is visibly short of breath with exertion.  She does feel a little more short of breath than her baseline.  She does report a chronic cough and chronic wheezing.  She does not have any change in the  amount or nature of her sputum production.  She has not had any fevers or chills recently nor does she report any signs of an upper respiratory tract infection.  Patient reports a longstanding history of smoking, having quit in 2010.  She has at least 40 pack years of smoking history.  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   Ancillary information including prior medications, full medical/surgical/family/social histories, and PFTs (when available) are listed below and have been reviewed.   Review of Systems  Constitutional:  Negative for chills, fever and weight loss.  Respiratory:  Positive for cough, shortness of breath and wheezing. Negative for hemoptysis and sputum production.   Cardiovascular:  Negative for chest pain.     Objective:   Vitals:   04/30/24 1134 04/30/24 1135  BP:  130/70  Pulse:  84  Temp:  (!) 97.4 F (36.3 C)  SpO2: (!) 80% 91%  Weight:  184 lb (83.5 kg)  Height:  5' 4 (1.626 m)   91% on 3 LPM  BMI Readings from Last 3 Encounters:  04/30/24 31.58 kg/m  05/08/23 32.70 kg/m  09/15/22 33.64 kg/m   Wt Readings from Last 3 Encounters:  04/30/24 184 lb (83.5 kg)  05/08/23 190 lb 7.6 oz (86.4 kg)  09/15/22 196 lb (88.9 kg)    Physical Exam Constitutional:      Appearance: Normal appearance.  Cardiovascular:     Rate and Rhythm: Normal rate and regular rhythm.     Pulses: Normal pulses.     Heart sounds: Normal heart sounds.  Pulmonary:     Effort: No respiratory distress.     Breath sounds: Wheezing present.  Neurological:     General: No focal deficit present.     Mental Status: She is alert and oriented to person, place, and time. Mental status is at baseline.       Ancillary Information    Past Medical History:  Diagnosis Date   Anemia    Asthma    Carpal tunnel syndrome of left wrist    COPD (chronic obstructive pulmonary disease) (HCC)    History of kidney stones    Hyperlipemia    Hypertension    Stroke  [facility])    Jan. 2020     Family History  Problem Relation Age of Onset   Heart disease Mother  died at 5   Diabetes Mother    Alzheimer's disease Father        died at 15   Parkinson's disease Sister    Heart disease Brother        died at 50   Diabetes Brother    Kidney disease Sister    Breast cancer Sister      Past Surgical History:  Procedure Laterality Date   CARPAL TUNNEL RELEASE Left    COLONOSCOPY N/A 09/07/2020   Procedure: COLONOSCOPY;  Surgeon: Maryruth Ole DASEN, MD;  Location: [facility] ENDOSCOPY;  Service: Endoscopy;  Laterality: N/A;   COLONOSCOPY WITH PROPOFOL  N/A 09/16/2022   Procedure: COLONOSCOPY WITH PROPOFOL ;  Surgeon: Therisa Bi, MD;  Location: [facility] ENDOSCOPY;  Service: Endoscopy;  Laterality: N/A;   ESOPHAGOGASTRODUODENOSCOPY (EGD) WITH PROPOFOL  N/A 09/15/2022   Procedure: ESOPHAGOGASTRODUODENOSCOPY (EGD) WITH PROPOFOL ;  Surgeon: Onita Elspeth Sharper, DO;  Location: [facility] ENDOSCOPY;  Service: Gastroenterology;  Laterality: N/A;   fractured tibia Left    repair   ROTATOR CUFF REPAIR Left    TEE WITHOUT CARDIOVERSION N/A 09/09/2018   Procedure: TRANSESOPHAGEAL ECHOCARDIOGRAM (TEE);  Surgeon: Bosie Vinie LABOR, MD;  Location: [facility] ORS;  Service: Cardiovascular;  Laterality: N/A;   TUBAL LIGATION      Social History   Socioeconomic History   Marital status: Widowed    Spouse name: Not on file   Number of children: Not on file   Years of education: Not on file   Highest education level: Not on file  Occupational History   Occupation: retired     Comment: Conservation officer, nature    Comment: school bus driver  Tobacco Use   Smoking status: Former    Current packs/day: 0.00    Average packs/day: 1 pack/day for 40.0 years (40.0 ttl pk-yrs)    Types: Cigarettes    Quit date: 2010    Years since quitting: 15.6   Smokeless tobacco: Never  Vaping Use   Vaping status: Never Used  Substance and Sexual Activity   Alcohol use: No   Drug use: No   Sexual activity: Not  on file  Other Topics Concern   Not on file  Social History Narrative   Not on file   Social Drivers of Health   Financial Resource Strain: Medium Risk (11/23/2023)   Received from [facility]   Overall Financial Resource Strain (CARDIA)    Difficulty of Paying Living Expenses: Somewhat hard  Food Insecurity: No Food Insecurity (11/23/2023)   Received from [facility]   Hunger Vital Sign    Within the past 12 months, you worried that your food would run out before you got the money to buy more.: Never true    Within the past 12 months, the food you bought just didn't last and you didn't have money to get more.: Never true  Transportation Needs: No Transportation Needs (11/23/2023)   Received from [facility] - Transportation    In the past 12 months, has lack of transportation kept you from medical appointments or from getting medications?: No    Lack of Transportation (Non-Medical): No  Physical Activity: Unknown (09/09/2018)   Exercise Vital Sign    Days of Exercise per Week: 0 days    Minutes of Exercise per Session: Not on file  Stress: Not on file  Social Connections: Unknown (09/09/2018)   Social Connection and Isolation Panel    Frequency of Communication with Friends and Family: More than  three times a week    Frequency of Social Gatherings with Friends and Family: Once a week    Attends Religious Services: Never    Database administrator or Organizations: Not on file    Attends Banker Meetings: Not on file    Marital Status: Widowed  Intimate Partner Violence: Not At Risk (09/15/2022)   Humiliation, Afraid, Rape, and Kick questionnaire    Fear of Current or Ex-Partner: No    Emotionally Abused: No    Physically Abused: No    Sexually Abused: No     Allergies  Allergen Reactions   Bee Venom Shortness Of Breath and Swelling   Hydrocodone-Acetaminophen  Nausea And Vomiting   Lactose Diarrhea    Lactose Intolerance (Gi) Diarrhea   Percocet [Oxycodone -Acetaminophen ] Nausea And Vomiting   Vicodin [Hydrocodone-Acetaminophen ] Nausea And Vomiting   Penicillins Hives    Has patient had a PCN reaction causing immediate rash, facial/tongue/throat swelling, SOB or lightheadedness with hypotension: no  Has patient had a PCN reaction causing severe rash involving mucus membranes or skin necrosis: no  Has patient had a PCN reaction that required hospitalization  no  Has patient had a PCN reaction occurring within the last 10 years: no  If all of the above answers are NO, then may proceed with Cephalosporin use.  Has patient had a PCN reaction causing immediate rash, facial/tongue/throat swelling, SOB or lightheadedness with hypotension: no Has patient had a PCN reaction causing severe rash involving mucus membranes or skin necrosis: no Has patient had a PCN reaction that required hospitalization  no Has patient had a PCN reaction occurring within the last 10 years: no If all of the above answers are NO, then may proceed with Cephalosporin use.     CBC    Component Value Date/Time   WBC 8.2 05/05/2023 0104   RBC 4.22 05/05/2023 0104   HGB 12.2 05/05/2023 0104   HGB 9.8 (L) 06/24/2014 0357   HCT 37.9 05/05/2023 0104   HCT 30.3 (L) 06/24/2014 0357   PLT 239 05/05/2023 0104   PLT 319 06/24/2014 0357   MCV 89.8 05/05/2023 0104   MCV 85 06/24/2014 0357   MCH 28.9 05/05/2023 0104   MCHC 32.2 05/05/2023 0104   RDW 14.0 05/05/2023 0104   RDW 17.7 (H) 06/24/2014 0357   LYMPHSABS 1.9 09/14/2022 2023   LYMPHSABS 2.3 06/24/2014 0357   MONOABS 1.9 (H) 09/14/2022 2023   MONOABS 1.5 (H) 06/24/2014 0357   EOSABS 0.1 09/14/2022 2023   EOSABS 0.5 06/24/2014 0357   BASOSABS 0.1 09/14/2022 2023   BASOSABS 0.1 06/24/2014 0357    Pulmonary Functions Testing Results:     No data to display          Outpatient Medications Prior to Visit  Medication Sig Dispense Refill   albuterol   (PROVENTIL  HFA;VENTOLIN  HFA) 108 (90 Base) MCG/ACT inhaler Inhale 2-4 puffs by mouth every 4 hours as needed for wheezing, cough, and/or shortness of breath 1 Inhaler 1   alendronate (FOSAMAX) 70 MG tablet Take 70 mg by mouth once a week.     aspirin  EC 81 MG EC tablet Take 1 tablet (81 mg total) by mouth daily.     atorvastatin  (LIPITOR) 80 MG tablet Take 80 mg by mouth at bedtime.     Cholecalciferol  25 MCG (1000 UT) tablet Take 1,000 Units by mouth daily.     diltiazem  (CARDIZEM  CD) 300 MG 24 hr capsule Take 1 capsule (300 mg total) by mouth at  bedtime. 30 capsule 1   DULoxetine  (CYMBALTA ) 60 MG capsule Take 60 mg by mouth every morning.     ezetimibe (ZETIA) 10 MG tablet Take 10 mg by mouth daily.     famotidine  (PEPCID ) 40 MG tablet Take 40 mg by mouth daily.     fluticasone  (FLONASE ) 50 MCG/ACT nasal spray Place 1 spray into both nostrils daily as needed for allergies.     fluticasone  furoate-vilanterol (BREO ELLIPTA ) 100-25 MCG/INH AEPB Inhale 1 puff into the lungs daily.     guaiFENesin  (MUCINEX ) 600 MG 12 hr tablet Take 1 tablet (600 mg total) by mouth 2 (two) times daily as needed.     hydrocortisone 2.5 % cream Apply 1 Application topically 3 (three) times daily.     ipratropium-albuterol  (DUONEB) 0.5-2.5 (3) MG/3ML SOLN Take 3 mLs by nebulization every 6 (six) hours as needed. 360 mL 1   lisinopril  (ZESTRIL ) 20 MG tablet Take 20 mg by mouth daily.     montelukast  (SINGULAIR ) 10 MG tablet Take 10 mg by mouth daily.     Multiple Vitamin (MULTI-VITAMIN DAILY PO) Take 1 tablet by mouth daily.     omeprazole (PRILOSEC) 20 MG capsule Take 20 mg by mouth daily.     oxybutynin  (DITROPAN  XL) 15 MG 24 hr tablet Take 15 mg by mouth daily.     potassium chloride  SA (K-DUR,KLOR-CON ) 20 MEQ tablet Take 1 tablet (20 mEq total) by mouth daily. 15 tablet 0   traMADol  (ULTRAM ) 50 MG tablet Take 1 tablet (50 mg total) by mouth every 6 (six) hours as needed. 5 tablet 0   umeclidinium bromide  (INCRUSE  ELLIPTA) 62.5 MCG/INH AEPB Inhale 1 puff into the lungs daily.     vitamin B-12 (CYANOCOBALAMIN ) 1000 MCG tablet Take 1,000 mcg by mouth daily.      Ascorbic Acid  (VITAMIN C ) 1000 MG tablet Take 1,000 mg by mouth daily. (Patient not taking: Reported on 04/30/2024)     No facility-administered medications prior to visit.

## 2024-04-30 NOTE — Telephone Encounter (Signed)
 For the codes 68372, [id number], [phone] Prior Auth Not Required Refer # [id number]

## 2024-05-02 DIAGNOSIS — H903 Sensorineural hearing loss, bilateral: Secondary | ICD-10-CM | POA: Diagnosis not present

## 2024-05-02 DIAGNOSIS — H6981 Other specified disorders of Eustachian tube, right ear: Secondary | ICD-10-CM | POA: Diagnosis not present

## 2024-05-07 ENCOUNTER — Ambulatory Visit: Admitting: Student in an Organized Health Care Education/Training Program

## 2024-05-07 DIAGNOSIS — J42 Unspecified chronic bronchitis: Secondary | ICD-10-CM | POA: Diagnosis not present

## 2024-05-07 LAB — PULMONARY FUNCTION TEST
DL/VA % pred: 62 %
DL/VA: 2.58 ml/min/mmHg/L
DLCO unc % pred: 46 %
DLCO unc: 8.77 ml/min/mmHg
FEF 25-75 Post: 0.37 L/s
FEF 25-75 Pre: 0.43 L/s
FEF2575-%Change-Post: -14 %
FEF2575-%Pred-Post: 21 %
FEF2575-%Pred-Pre: 25 %
FEV1-%Change-Post: 3 %
FEV1-%Pred-Post: 49 %
FEV1-%Pred-Pre: 47 %
FEV1-Post: 1.06 L
FEV1-Pre: 1.02 L
FEV1FVC-%Change-Post: 7 %
FEV1FVC-%Pred-Pre: 65 %
FEV6-%Change-Post: -3 %
FEV6-%Pred-Post: 71 %
FEV6-%Pred-Pre: 74 %
FEV6-Post: 1.94 L
FEV6-Pre: 2.01 L
FEV6FVC-%Change-Post: 0 %
FEV6FVC-%Pred-Post: 103 %
FEV6FVC-%Pred-Pre: 102 %
FVC-%Change-Post: -3 %
FVC-%Pred-Post: 70 %
FVC-%Pred-Pre: 72 %
FVC-Post: 2 L
FVC-Pre: 2.08 L
Post FEV1/FVC ratio: 53 %
Post FEV6/FVC ratio: 98 %
Pre FEV1/FVC ratio: 49 %
Pre FEV6/FVC Ratio: 97 %
RV % pred: 135 %
RV: 3.08 L
TLC % pred: 104 %
TLC: 5.29 L

## 2024-05-07 NOTE — Progress Notes (Signed)
 Full PFT completed today ? ?

## 2024-05-07 NOTE — Patient Instructions (Signed)
 Full PFT completed today ? ?

## 2024-05-09 ENCOUNTER — Ambulatory Visit
Admission: RE | Admit: 2024-05-09 | Discharge: 2024-05-09 | Disposition: A | Discharge status: Home / Self Care | Source: Ambulatory Visit | Attending: Student in an Organized Health Care Education/Training Program | Admitting: Student in an Organized Health Care Education/Training Program

## 2024-05-09 DIAGNOSIS — R911 Solitary pulmonary nodule: Secondary | ICD-10-CM

## 2024-05-09 DIAGNOSIS — R918 Other nonspecific abnormal finding of lung field: Secondary | ICD-10-CM | POA: Diagnosis not present

## 2024-05-09 LAB — GLUCOSE, CAPILLARY: Glucose-Capillary: 110 mg/dL — ABNORMAL HIGH (ref 70–99)

## 2024-05-09 MED ORDER — FLUDEOXYGLUCOSE F - 18 (FDG) INJECTION
10.2600 | Freq: Once | INTRAVENOUS | Status: AC | PRN
  Administered 2024-05-09: 10.26 via INTRAVENOUS

## 2024-05-13 ENCOUNTER — Encounter
Admission: RE | Admit: 2024-05-13 | Discharge: 2024-05-13 | Disposition: A | Discharge status: Home / Self Care | Source: Ambulatory Visit | Attending: Student in an Organized Health Care Education/Training Program | Admitting: Student in an Organized Health Care Education/Training Program

## 2024-05-13 ENCOUNTER — Other Ambulatory Visit: Payer: Self-pay

## 2024-05-13 DIAGNOSIS — Z01812 Encounter for preprocedural laboratory examination: Secondary | ICD-10-CM

## 2024-05-13 DIAGNOSIS — I5032 Chronic diastolic (congestive) heart failure: Secondary | ICD-10-CM

## 2024-05-13 DIAGNOSIS — J449 Chronic obstructive pulmonary disease, unspecified: Secondary | ICD-10-CM

## 2024-05-13 DIAGNOSIS — I10 Essential (primary) hypertension: Secondary | ICD-10-CM

## 2024-05-13 HISTORY — DX: Dyspnea, unspecified: R06.00

## 2024-05-13 HISTORY — DX: Unspecified osteoarthritis, unspecified site: M19.90

## 2024-05-13 HISTORY — DX: Pneumonia, unspecified organism: J18.9

## 2024-05-13 HISTORY — DX: Anxiety disorder, unspecified: F41.9

## 2024-05-13 NOTE — Patient Instructions (Addendum)
 Your procedure is scheduled on: 05/20/24 - Tuesday Report to the Registration Desk on the 1st floor of the [facility]. To find out your arrival time, please call [phone] between 1PM - 3PM on: 05/19/24 - Monday If your arrival time is 6:00 am, do not arrive before that time as the [facility] entrance doors do not open until 6:00 am.  REMEMBER: Instructions that are not followed completely may result in serious medical risk, up to and including death; or upon the discretion of your surgeon and anesthesiologist your surgery may need to be rescheduled.  Do not eat food or drink any liquids after midnight the night before surgery.  No gum chewing or hard candies.  One week prior to surgery: Stop Anti-inflammatories (NSAIDS) such as Advil, Aleve, Ibuprofen, Motrin, Naproxen, Naprosyn and Aspirin  based products such as Excedrin, Goody's Powder, BC Powder. You may take Tylenol  if needed for pain up until the day of surgery.  Stop ANY OVER THE COUNTER supplements until after surgery.  ON THE DAY OF SURGERY ONLY TAKE THESE MEDICATIONS WITH SIPS OF WATER:  DULoxetine  (CYMBALTA )  omeprazole (PRILOSEC)  BREO ELLIPTA  oxybutynin  (DITROPAN  XL)  INCRUSE ELLIPTA   Use inhaler albuterol  (PROVENTIL  HFA;VENTOLIN  HFA) on the day of surgery and bring to the hospital.   No Alcohol for 24 hours before or after surgery.  No Smoking including e-cigarettes for 24 hours before surgery.  No chewable tobacco products for at least 6 hours before surgery.  No nicotine patches on the day of surgery.  Do not use any recreational drugs for at least a week (preferably 2 weeks) before your surgery.  Please be advised that the combination of cocaine and anesthesia may have negative outcomes, up to and including death. If you test positive for cocaine, your surgery will be cancelled.  On the morning of surgery brush your teeth with toothpaste and water, you may rinse your mouth with mouthwash if you  wish. Do not swallow any toothpaste or mouthwash.  Do not wear jewelry, make-up, hairpins, clips or nail polish.  For welded (permanent) jewelry: bracelets, anklets, waist bands, etc.  Please have this removed prior to surgery.  If it is not removed, there is a chance that hospital personnel will need to cut it off on the day of surgery.  Do not wear lotions, powders, or perfumes.   Do not shave body hair from the neck down 48 hours before surgery.  Contact lenses, hearing aids and dentures may not be worn into surgery.  Do not bring valuables to the hospital. [facility] is not responsible for any missing/lost belongings or valuables.   Notify your doctor if there is any change in your medical condition (cold, fever, infection).  Wear comfortable clothing (specific to your surgery type) to the hospital.  After surgery, you can help prevent lung complications by doing breathing exercises.  Take deep breaths and cough every 1-2 hours. Your doctor may order a device called an Incentive Spirometer to help you take deep breaths.  When coughing or sneezing, hold a pillow firmly against your incision with both hands. This is called "splinting." Doing this helps protect your incision. It also decreases belly discomfort.  If you are being admitted to the hospital overnight, leave your suitcase in the car. After surgery it may be brought to your room.  In case of increased patient census, it may be necessary for you, the patient, to continue your postoperative care in the Same Day Surgery department.  If you  are being discharged the day of surgery, you will not be allowed to drive home. You will need a responsible individual to drive you home and stay with you for 24 hours after surgery.   If you are taking public transportation, you will need to have a responsible individual with you.  Please call the Pre-admissions Testing Dept. at [phone] if you have any questions about these  instructions.  Surgery Visitation Policy:  Patients having surgery or a procedure may have two visitors.  Children under the age of 65 must have an adult with them who is not the patient.  Inpatient Visitation:    Visiting hours are 7 a.m. to 8 p.m. Up to four visitors are allowed at one time in a patient room. The visitors may rotate out with other people during the day.  One visitor age 49 or older may stay with the patient overnight and must be in the room by 8 p.m.   Merchandiser, retail to address health-related social needs:  [email or web address]

## 2024-05-14 NOTE — Telephone Encounter (Signed)
 I notified the patient. She was okay with the time change. She will be her at 1:30 for a 2:30pm procedure time.   Nothing further needed.

## 2024-05-14 NOTE — Telephone Encounter (Signed)
 Lm for the patient. I am trying to move her Bronchoscopy on 9/23 from 9:30am to 2:30pm.

## 2024-05-16 ENCOUNTER — Other Ambulatory Visit

## 2024-05-19 ENCOUNTER — Inpatient Hospital Stay: Admission: RE | Admit: 2024-05-19 | Source: Ambulatory Visit

## 2024-05-20 ENCOUNTER — Encounter: Payer: Self-pay | Admitting: Student in an Organized Health Care Education/Training Program

## 2024-05-20 ENCOUNTER — Other Ambulatory Visit: Payer: Self-pay

## 2024-05-20 ENCOUNTER — Ambulatory Visit: Payer: Self-pay | Admitting: Urgent Care

## 2024-05-20 ENCOUNTER — Ambulatory Visit
Admission: RE | Admit: 2024-05-20 | Discharge: 2024-05-20 | Disposition: A | Discharge status: Home / Self Care | Attending: Student in an Organized Health Care Education/Training Program | Admitting: Student in an Organized Health Care Education/Training Program

## 2024-05-20 ENCOUNTER — Ambulatory Visit

## 2024-05-20 ENCOUNTER — Encounter
Admission: RE | Disposition: A | Discharge status: Home / Self Care | Payer: Self-pay | Source: Home / Self Care | Attending: Student in an Organized Health Care Education/Training Program

## 2024-05-20 ENCOUNTER — Ambulatory Visit: Admitting: General Practice

## 2024-05-20 DIAGNOSIS — K219 Gastro-esophageal reflux disease without esophagitis: Secondary | ICD-10-CM | POA: Insufficient documentation

## 2024-05-20 DIAGNOSIS — Z48813 Encounter for surgical aftercare following surgery on the respiratory system: Secondary | ICD-10-CM | POA: Diagnosis not present

## 2024-05-20 DIAGNOSIS — I5032 Chronic diastolic (congestive) heart failure: Secondary | ICD-10-CM

## 2024-05-20 DIAGNOSIS — R918 Other nonspecific abnormal finding of lung field: Secondary | ICD-10-CM | POA: Diagnosis not present

## 2024-05-20 DIAGNOSIS — I69931 Monoplegia of upper limb following unspecified cerebrovascular disease affecting right dominant side: Secondary | ICD-10-CM | POA: Diagnosis not present

## 2024-05-20 DIAGNOSIS — J4489 Other specified chronic obstructive pulmonary disease: Secondary | ICD-10-CM | POA: Diagnosis not present

## 2024-05-20 DIAGNOSIS — I10 Essential (primary) hypertension: Secondary | ICD-10-CM | POA: Diagnosis not present

## 2024-05-20 DIAGNOSIS — R911 Solitary pulmonary nodule: Secondary | ICD-10-CM | POA: Diagnosis not present

## 2024-05-20 DIAGNOSIS — Z87891 Personal history of nicotine dependence: Secondary | ICD-10-CM | POA: Diagnosis not present

## 2024-05-20 DIAGNOSIS — C3412 Malignant neoplasm of upper lobe, left bronchus or lung: Secondary | ICD-10-CM | POA: Diagnosis not present

## 2024-05-20 DIAGNOSIS — Z01810 Encounter for preprocedural cardiovascular examination: Secondary | ICD-10-CM | POA: Diagnosis not present

## 2024-05-20 DIAGNOSIS — J8410 Pulmonary fibrosis, unspecified: Secondary | ICD-10-CM | POA: Diagnosis not present

## 2024-05-20 DIAGNOSIS — J449 Chronic obstructive pulmonary disease, unspecified: Secondary | ICD-10-CM

## 2024-05-20 DIAGNOSIS — I700 Atherosclerosis of aorta: Secondary | ICD-10-CM | POA: Diagnosis not present

## 2024-05-20 DIAGNOSIS — I110 Hypertensive heart disease with heart failure: Secondary | ICD-10-CM | POA: Diagnosis not present

## 2024-05-20 HISTORY — PX: VIDEO BRONCHOSCOPY WITH ENDOBRONCHIAL NAVIGATION: SHX6175

## 2024-05-20 LAB — BASIC METABOLIC PANEL WITH GFR
Anion gap: 11 (ref 5–15)
BUN: 21 mg/dL (ref 8–23)
CO2: 26 mmol/L (ref 22–32)
Calcium: 9.2 mg/dL (ref 8.9–10.3)
Chloride: 104 mmol/L (ref 98–111)
Creatinine, Ser: 1.02 mg/dL — ABNORMAL HIGH (ref 0.44–1.00)
GFR, Estimated: 58 mL/min — ABNORMAL LOW (ref >60)
Glucose, Bld: 127 mg/dL — ABNORMAL HIGH (ref 70–99)
Potassium: 3.8 mmol/L (ref 3.5–5.1)
Sodium: 141 mmol/L (ref 135–145)

## 2024-05-20 LAB — CBC
HCT: 39.4 % (ref 36.0–46.0)
Hemoglobin: 12.8 g/dL (ref 12.0–15.0)
MCH: 28.8 pg (ref 26.0–34.0)
MCHC: 32.5 g/dL (ref 30.0–36.0)
MCV: 88.7 fL (ref 80.0–100.0)
Platelets: 271 K/uL (ref 150–400)
RBC: 4.44 MIL/uL (ref 3.87–5.11)
RDW: 15.6 % — ABNORMAL HIGH (ref 11.5–15.5)
WBC: 11.8 K/uL — ABNORMAL HIGH (ref 4.0–10.5)
nRBC: 0 % (ref 0.0–0.2)

## 2024-05-20 LAB — GLUCOSE, CAPILLARY: Glucose-Capillary: 97 mg/dL (ref 70–99)

## 2024-05-20 SURGERY — VIDEO BRONCHOSCOPY WITH ENDOBRONCHIAL NAVIGATION
Anesthesia: General | Laterality: Bilateral

## 2024-05-20 MED ORDER — ROCURONIUM BROMIDE 100 MG/10ML IV SOLN
INTRAVENOUS | Status: DC | PRN
  Administered 2024-05-20: 50 mg via INTRAVENOUS

## 2024-05-20 MED ORDER — IPRATROPIUM-ALBUTEROL 0.5-2.5 (3) MG/3ML IN SOLN
RESPIRATORY_TRACT | Status: AC
  Filled 2024-05-20: qty 3

## 2024-05-20 MED ORDER — FENTANYL CITRATE (PF) 100 MCG/2ML IJ SOLN
INTRAMUSCULAR | Status: AC
  Filled 2024-05-20: qty 2

## 2024-05-20 MED ORDER — CHLORHEXIDINE GLUCONATE 0.12 % MT SOLN
15.0000 mL | Freq: Once | OROMUCOSAL | Status: AC
  Administered 2024-05-20: 15 mL via OROMUCOSAL

## 2024-05-20 MED ORDER — SUGAMMADEX SODIUM 200 MG/2ML IV SOLN
INTRAVENOUS | Status: DC | PRN
  Administered 2024-05-20: 200 mg via INTRAVENOUS

## 2024-05-20 MED ORDER — PREDNISONE 20 MG PO TABS: 20.0000 mg | ORAL_TABLET | Freq: Every day | ORAL | 0 refills | Status: AC

## 2024-05-20 MED ORDER — DEXAMETHASONE SODIUM PHOSPHATE 10 MG/ML IJ SOLN
INTRAMUSCULAR | Status: AC
  Filled 2024-05-20: qty 2

## 2024-05-20 MED ORDER — PROPOFOL 10 MG/ML IV BOLUS
INTRAVENOUS | Status: DC | PRN
  Administered 2024-05-20: 100 mg via INTRAVENOUS

## 2024-05-20 MED ORDER — ORAL CARE MOUTH RINSE: 15.0000 mL | Freq: Once | OROMUCOSAL | Status: AC

## 2024-05-20 MED ORDER — ROCURONIUM BROMIDE 10 MG/ML (PF) SYRINGE
PREFILLED_SYRINGE | INTRAVENOUS | Status: AC
  Filled 2024-05-20: qty 10

## 2024-05-20 MED ORDER — AZITHROMYCIN 250 MG PO TABS: ORAL_TABLET | ORAL | 0 refills | Status: DC

## 2024-05-20 MED ORDER — IPRATROPIUM-ALBUTEROL 0.5-2.5 (3) MG/3ML IN SOLN
3.0000 mL | Freq: Once | RESPIRATORY_TRACT | Status: AC
  Administered 2024-05-20: 3 mL via RESPIRATORY_TRACT

## 2024-05-20 MED ORDER — PROPOFOL 500 MG/50ML IV EMUL
INTRAVENOUS | Status: DC | PRN
  Administered 2024-05-20: 125 ug/kg/min via INTRAVENOUS

## 2024-05-20 MED ORDER — CHLORHEXIDINE GLUCONATE 0.12 % MT SOLN
OROMUCOSAL | Status: AC
  Filled 2024-05-20: qty 15

## 2024-05-20 MED ORDER — LACTATED RINGERS IV SOLN
INTRAVENOUS | Status: DC
Start: 2024-05-20 — End: 2024-05-20

## 2024-05-20 MED ORDER — PROPOFOL 1000 MG/100ML IV EMUL
INTRAVENOUS | Status: AC
  Filled 2024-05-20: qty 100

## 2024-05-20 MED ORDER — LIDOCAINE HCL (CARDIAC) PF 100 MG/5ML IV SOSY
PREFILLED_SYRINGE | INTRAVENOUS | Status: DC | PRN
  Administered 2024-05-20: 100 mg via INTRAVENOUS

## 2024-05-20 MED ORDER — PROPOFOL 10 MG/ML IV BOLUS
INTRAVENOUS | Status: AC
  Filled 2024-05-20: qty 20

## 2024-05-20 MED ORDER — PHENYLEPHRINE 80 MCG/ML (10ML) SYRINGE FOR IV PUSH (FOR BLOOD PRESSURE SUPPORT)
PREFILLED_SYRINGE | INTRAVENOUS | Status: DC | PRN
  Administered 2024-05-20: 80 ug via INTRAVENOUS
  Administered 2024-05-20: 160 ug via INTRAVENOUS

## 2024-05-20 MED ORDER — PHENYLEPHRINE HCL-NACL 20-0.9 MG/250ML-% IV SOLN
INTRAVENOUS | Status: DC | PRN
  Administered 2024-05-20: 40 ug/min via INTRAVENOUS

## 2024-05-20 MED ORDER — LIDOCAINE HCL (PF) 2 % IJ SOLN
INTRAMUSCULAR | Status: AC
  Filled 2024-05-20: qty 5

## 2024-05-20 MED ORDER — FENTANYL CITRATE (PF) 100 MCG/2ML IJ SOLN
INTRAMUSCULAR | Status: DC | PRN
  Administered 2024-05-20: 50 ug via INTRAVENOUS

## 2024-05-20 NOTE — Interval H&P Note (Signed)
 Patient is presenting for the evaluation of an FDG avid LUL pulmonary nodule noted on CT. She is here for robotic assisted navigational bronchoscopy for nodule biopsy with EBUS to stage the mediastinum. Risks and benefits explained, included a < 2% chance of experiencing bleeding or pneumothorax. She is appropriate for the procedure and consents to proceed. All the questions were answered.  Belva November, MD [facility] 05/20/2024 3:06 PM

## 2024-05-20 NOTE — Op Note (Signed)
 Video Bronchoscopy with Robotic Assisted Bronchoscopic Navigation   Date of Operation: 05/20/2024   Pre-op Diagnosis: Lung Nodule  Surgeon: Belva November, MD  Anesthesia: General endotracheal anesthesia  Operation: Flexible video fiberoptic bronchoscopy with robotic assistance and biopsies.  Estimated Blood Loss: Minimal  Complications: None  Indications and History: Audrey Sexton is a 73 y.o. female with history of Emphysema. Recommendation made to achieve a tissue diagnosis via robotic assisted navigational bronchoscopy.  The risks, benefits, complications, treatment options and expected outcomes were discussed with the patient.  The possibilities of pneumothorax, pneumonia, reaction to medication, pulmonary aspiration, perforation of a viscus, bleeding, failure to diagnose a condition and creating a complication requiring transfusion or operation were discussed with the patient who freely signed the consent.    Description of Procedure: The patient was seen in the Preoperative Area, was examined and was deemed appropriate to proceed.  The patient was taken to [facility] procedure room, identified as Audrey Sexton and the procedure verified as Flexible Video Fiberoptic Bronchoscopy.  A Time Out was held and the above information confirmed.   Prior to the date of the procedure a high-resolution CT scan of the chest was performed. Utilizing ION software program a virtual tracheobronchial tree was generated to allow the creation of distinct navigation pathways to the patient's parenchymal abnormalities. After being taken to the operating room general anesthesia was initiated and the patient  was orally intubated. The video fiberoptic bronchoscope was introduced via the endotracheal tube and a general inspection was performed which showed normal right and left lung anatomy. Aspiration of the bilateral mainstems was completed to remove any remaining secretions. Robotic catheter inserted into  patient's endotracheal tube.   Target #1 LUL nodule: The distinct navigation pathways prepared prior to this procedure were then utilized to navigate to patient's lesion identified on CT scan. The robotic catheter was secured into place and the vision probe was withdrawn.  Lesion location was approximated using fluoroscopy.  We also used radial EBUS to approximate the location, and we obtained a concentric radial EBUS signal. Local registration and targeting was performed using GE 3D OEC mobile C-arm three-dimensional imaging. Under fluoroscopic guidance transbronchial brushings, transbronchial needle biopsies, and transbronchial forceps biopsies were performed to be sent for cytology and pathology.  Needle-in-lesion was confirmed using GE mobile C-arm.  A bronchioalveolar lavage was performed in the LUL and sent for microbiology.     Target #2 EBUS: The EBUS bronchoscope was introduced into the airway and the hilar and mediastinal lymph node stations were evaluated. Stations 7, 4L and 11L were noted to be enlarged and were biopsied with a 21G Olympus ViziShot 2 needle.  At the end of the procedure a general airway inspection was performed and there was no evidence of active bleeding. The bronchoscope was removed.  The patient tolerated the procedure well. There was no significant blood loss and there were no obvious complications. A post-procedural chest x-ray is pending.  Samples Target #1: LUL 1. Transbronchial brushings from LUL nodule 2. Transbronchial Wang needle biopsies from LUL nodule 3. Transbronchial forceps biopsies from LUL nodule 4. Bronchoalveolar lavage from LUL  Samples Target #2: EBUS to stations 7, 4L, and 11L. Biopsied with 21G ViziShot 2 needle  Plans:  The patient will be discharged from the PACU to home when recovered from anesthesia and after chest x-ray is reviewed. We will review the cytology, pathology and microbiology results with the patient when they become  available. Outpatient followup will be with myself.  Belva November, MD [facility] 05/20/2024 4:55 PM

## 2024-05-20 NOTE — Procedures (Signed)
 Procedure Note  Patient: Audrey Sexton  GE 3D OEC mobile C-arm was utilized to identify and biopsy of LUL nodule.  Needle-in-lesion was confirmed using real-time GE 3D OEC imaging, and images were uploaded to PACS.     Belva November, MD [facility] 05/20/2024 4:57 PM

## 2024-05-20 NOTE — Anesthesia Preprocedure Evaluation (Signed)
 Anesthesia Evaluation  Patient identified by MRN, date of birth, ID band Patient awake    Reviewed: Allergy & Precautions, NPO status , Patient's Chart, lab work & pertinent test results  History of Anesthesia Complications Negative for: history of anesthetic complications  Airway Mallampati: III   Neck ROM: Full    Dental  (+) Poor Dentition   Pulmonary asthma , COPD (on 3L home O2), former smoker   Pulmonary exam normal breath sounds clear to auscultation       Cardiovascular hypertension, Normal cardiovascular exam Rhythm:Regular Rate:Normal  ECG 09/15/22: Sinus tachycardia (HR 101)   Neuro/Psych 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)    GI/Hepatic ,GERD  ,,  Endo/Other  Obesity   Renal/GU      Musculoskeletal   Abdominal   Peds  Hematology  (+) Blood dyscrasia, anemia   Anesthesia Other Findings   Reproductive/Obstetrics                              Anesthesia Physical Anesthesia Plan  ASA: 4  Anesthesia Plan: General ETT   Post-op Pain Management:    Induction: Intravenous  PONV Risk Score and Plan: 3 and Ondansetron , Dexamethasone , Propofol  infusion and TIVA  Airway Management Planned: Oral ETT  Additional Equipment:   Intra-op Plan:   Post-operative Plan: Extubation in OR  Informed Consent: I have reviewed the patients History and Physical, chart, labs and discussed the procedure including the risks, benefits and alternatives for the proposed anesthesia with the patient or authorized representative who has indicated his/her understanding and acceptance.     Dental Advisory Given  Plan Discussed with: Anesthesiologist, CRNA and Surgeon  Anesthesia Plan Comments: (Patient consented for risks of anesthesia including but not limited to:  - adverse reactions to medications - damage to eyes, teeth, lips or other oral mucosa - nerve damage due to positioning  -  sore throat or hoarseness - Damage to heart, brain, nerves, lungs, other parts of body or loss of life  Patient voiced understanding and assent.)        Anesthesia Quick Evaluation

## 2024-05-20 NOTE — Anesthesia Procedure Notes (Signed)
 Procedure Name: Intubation Date/Time: 05/20/2024 3:49 PM  Performed by: Jackye Spanner, CRNAPre-anesthesia Checklist: Patient identified, Patient being monitored, Timeout performed, Emergency Drugs available and Suction available Patient Re-evaluated:Patient Re-evaluated prior to induction Oxygen  Delivery Method: Circle system utilized Preoxygenation: Pre-oxygenation with 100% oxygen  Induction Type: IV induction Ventilation: Mask ventilation without difficulty Laryngoscope Size: 3 and McGrath Grade View: Grade I Tube type: Oral Tube size: 8.5 mm Number of attempts: 1 Airway Equipment and Method: Stylet Placement Confirmation: ETT inserted through vocal cords under direct vision, positive ETCO2 and breath sounds checked- equal and bilateral Secured at: 21 cm Tube secured with: Tape Dental Injury: Teeth and Oropharynx as per pre-operative assessment  Comments: Smooth atraumatic intubation, no complications noted.

## 2024-05-20 NOTE — Anesthesia Postprocedure Evaluation (Signed)
 Anesthesia Post Note  Patient: Cindia KANDICE Gavel  Procedure(s) Performed: VIDEO BRONCHOSCOPY WITH ENDOBRONCHIAL NAVIGATION (Bilateral)  Patient location during evaluation: PACU Anesthesia Type: General Level of consciousness: awake and alert Pain management: pain level controlled Vital Signs Assessment: post-procedure vital signs reviewed and stable Respiratory status: spontaneous breathing, nonlabored ventilation, respiratory function stable and patient connected to nasal cannula oxygen  Cardiovascular status: blood pressure returned to baseline and stable Postop Assessment: no apparent nausea or vomiting Anesthetic complications: no   No notable events documented.   Last Vitals:  Vitals:   05/20/24 1810 05/20/24 1812  BP: (!) 140/90   Pulse: 94   Resp:    Temp: 36.5 C   SpO2:  91%    Last Pain:  Vitals:   05/20/24 1812  TempSrc:   PainSc: 0-No pain                 Debby Mines

## 2024-05-20 NOTE — Transfer of Care (Signed)
 Immediate Anesthesia Transfer of Care Note  Patient: Audrey Sexton  Procedure(s) Performed: VIDEO BRONCHOSCOPY WITH ENDOBRONCHIAL NAVIGATION (Bilateral)  Patient Location: PACU  Anesthesia Type:General  Level of Consciousness: awake  Airway & Oxygen  Therapy: Patient Spontanous Breathing and Patient connected to face mask oxygen   Post-op Assessment: Report given to RN and Post -op Vital signs reviewed and stable  Post vital signs: Reviewed and stable  Last Vitals:  Vitals Value Taken Time  BP 132/76 05/20/24 17:02  Temp 36.8 C 05/20/24 17:02  Pulse 76 05/20/24 17:13  Resp 24 05/20/24 17:13  SpO2 99 % 05/20/24 17:13  Vitals shown include unfiled device data.  Last Pain:  Vitals:   05/20/24 1702  PainSc: 0-No pain         Complications: No notable events documented.

## 2024-05-21 ENCOUNTER — Encounter: Payer: Self-pay | Admitting: Student in an Organized Health Care Education/Training Program

## 2024-05-22 LAB — ACID FAST SMEAR (AFB, MYCOBACTERIA): Acid Fast Smear: NEGATIVE

## 2024-05-23 ENCOUNTER — Ambulatory Visit: Payer: Self-pay | Admitting: Student in an Organized Health Care Education/Training Program

## 2024-05-23 DIAGNOSIS — C3412 Malignant neoplasm of upper lobe, left bronchus or lung: Secondary | ICD-10-CM

## 2024-05-23 DIAGNOSIS — R911 Solitary pulmonary nodule: Secondary | ICD-10-CM

## 2024-05-23 LAB — CULTURE, RESPIRATORY W GRAM STAIN: Culture: NO GROWTH

## 2024-05-23 LAB — SURGICAL PATHOLOGY

## 2024-05-23 LAB — CYTOLOGY - NON PAP

## 2024-05-30 ENCOUNTER — Inpatient Hospital Stay: Attending: Oncology | Admitting: Oncology

## 2024-05-30 ENCOUNTER — Encounter: Payer: Self-pay | Admitting: Oncology

## 2024-05-30 ENCOUNTER — Inpatient Hospital Stay

## 2024-05-30 VITALS — BP 120/66 | HR 100 | Temp 98.9°F | Resp 17 | Wt 188.0 lb

## 2024-05-30 DIAGNOSIS — J4489 Other specified chronic obstructive pulmonary disease: Secondary | ICD-10-CM | POA: Diagnosis not present

## 2024-05-30 DIAGNOSIS — Z87891 Personal history of nicotine dependence: Secondary | ICD-10-CM | POA: Diagnosis not present

## 2024-05-30 DIAGNOSIS — C3492 Malignant neoplasm of unspecified part of left bronchus or lung: Secondary | ICD-10-CM | POA: Insufficient documentation

## 2024-05-30 DIAGNOSIS — J9611 Chronic respiratory failure with hypoxia: Secondary | ICD-10-CM | POA: Insufficient documentation

## 2024-05-30 DIAGNOSIS — Z803 Family history of malignant neoplasm of breast: Secondary | ICD-10-CM | POA: Diagnosis not present

## 2024-05-30 DIAGNOSIS — C3490 Malignant neoplasm of unspecified part of unspecified bronchus or lung: Secondary | ICD-10-CM | POA: Insufficient documentation

## 2024-05-30 LAB — HEPATIC FUNCTION PANEL
ALT: 18 U/L (ref 0–44)
AST: 24 U/L (ref 15–41)
Albumin: 4 g/dL (ref 3.5–5.0)
Alkaline Phosphatase: 47 U/L (ref 38–126)
Bilirubin, Direct: 0.1 mg/dL (ref 0.0–0.2)
Indirect Bilirubin: 0.6 mg/dL (ref 0.3–0.9)
Total Bilirubin: 0.7 mg/dL (ref 0.0–1.2)
Total Protein: 7.1 g/dL (ref 6.5–8.1)

## 2024-05-30 NOTE — Progress Notes (Signed)
 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  Patient already has appointment with thoracic surgeon for evaluation of resection. She is likely not a good candidate due to chronic respiratory failure. Refer to radiation oncology for evaluation of SBRT.   Chronic respiratory failure with hypoxia (HCC) Patient is on nasal cannula oxygen .  COPD continue inhalers.  Follow-up with pulmonology   Orders Placed This Encounter  Procedures   Hepatic function panel    Standing Status:   Future    Number of Occurrences:   1    Expected Date:   05/30/2024    Expiration Date:   08/28/2024   Ambulatory referral to Radiation Oncology    Referral Priority:   Routine    Referral Type:   Consultation    Referral Reason:   Specialty Services Required    Requested Specialty:   Radiation Oncology    Number of Visits Requested:   1   Follow-up to be determined. All questions were answered. The patient knows to call the clinic with any problems, questions or concerns.  Call Babara, MD, PhD [facility] Hematology Oncology 05/30/2024    HISTORY OF PRESENTING ILLNESS:  Audrey Sexton 73 y.o. female presents to establish care for stage I left lung squamous cell carcinoma I have reviewed her chart and materials related to her cancer extensively and collaborated history with the patient. Summary of oncologic history is as follows: Oncology History  SCC (squamous cell carcinoma of lung) (HCC)  09/05/2023 Imaging   CT lung cancer  screening showed Lung-RADS 4BS  Multiple pulmonary nodules scattered throughout the lungs bilaterally, largest of which is in the central aspect of the left upper lobe (axial image 168), where there is a macrolobulated nodule with a volume derived mean diameter of 16.9 mm   09/25/2023 Imaging   PET scan showed 1. The central left upper lobe nodule measures 1.7 by 0.9 cm with maximum SUV of 9.7, compatible with malignancy. 2. An approximately 7 by 5 mm left lower lobe nodule is not appreciably hypermetabolic but is below sensitive PET-CT size thresholds. 3. Bosniak (2019 revision) category IIF lesion of the right kidney lower pole with 4 or more smooth thin internal septations, and some faint rim calcification, but no substantially enhancing component visible on the CT of 09/15/2022. This lesion appears photopenic but is otherwise incompletely characterized on today's exam. Renal protocol MRI with and without contrast recommended in 6 months time. 4.  Emphysema (ICD10-J43.9). 5.  Aortic and systemic atherosclerosis (ICD10-I70.0).   05/09/2024 Imaging   PET scan showed  1. The left upper lobe hilar nodule is mildly increased in size and metabolic activity compared to the prior exam, currently 2.3 by 1.2 cm with maximum SUV 11.6, formerly 1.7 by 1.0 cm with maximum SUV 9.7. This is suspicious for malignancy. 2. Stable 0.8 by 0.5 cm left lower lobe nodule is not substantially hypermetabolic although is just below sensitive PET-CT size thresholds. 3. Stable Bosniak category IIF cystic lesion of the right kidney lower pole with  thin internal septations and some faint calcification. Renal protocol MRI with and without contrast is recommended in 6 months time for surveillance. 4. Nonobstructive right nephrolithiasis. 5. Aortic Atherosclerosis (ICD10-I70.0) and Emphysema (ICD10-J43.9).     05/30/2024 Initial Diagnosis   SCC (squamous cell carcinoma of lung)   05/20/2024, patient  underwent biopsy of the left hilar nodule via bronchoscopy.    Pathology showed 1. Lung, biopsy, left :  MODERATE TO POORLY DIFFERENTIATED SQUAMOUS CELL CARCINOMA   cytology showed  1. Fine Needle Aspiration, LUL MALIGNANT SQUAMOUS CELL CARCINOMA 2. Bronchial Brushing, LUL MALIGNANT SQUAMOUS CELL CARCINOMA 3. Fine Needle Aspiration Lymph Node 7 NEGATIVE FOR MALIGNANCY COMPATIBLE WITH BENIGN LYMPH NODE 4. Fine Needle Aspiration Lymph Node 4L NEGATIVE FOR MALIGNANCY COMPATIBLE WITH A BENIGN LYMPH NODE 5. Fine Needle Aspiration, 11L NEGATIVE FOR MALIGNANCY SCANT CELLULARITY; SCATTERED LYMPHOCYTES RARE BENIGN BRONCHIAL CELLS   05/30/2024 Cancer Staging   Staging form: Lung, AJCC V9 - Clinical stage from 05/30/2024: Stage IA3 (cT1c, cN0, cM0) - Signed by Babara Call, MD on 05/30/2024 Stage prefix: Initial diagnosis    Patient presents to discuss pathology results and management plan.  Patient has been referred to thoracic surgery for evaluation.  Patient is a former smoker.  She has history of COPD, chronic respiratory failure on nasal cannula oxygen .  Other history includes arthritis, hyperlipidemia, hypertension, anemia, anxiety. Patient is accompanied by daughter today.  Patient lives at home by herself.  MEDICAL HISTORY:  Past Medical History:  Diagnosis Date   Anemia    Anxiety    Arthritis    Asthma    Carpal tunnel syndrome of left wrist    COPD (chronic obstructive pulmonary disease) (HCC)    Dyspnea    History of kidney stones    Hyperlipemia    Hypertension    Pneumonia    Stroke [facility])    Jan. 2020    SURGICAL HISTORY: Past Surgical History:  Procedure Laterality Date   CARPAL TUNNEL RELEASE Left    COLONOSCOPY N/A 09/07/2020   Procedure: COLONOSCOPY;  Surgeon: Maryruth Ole DASEN, MD;  Location: [facility] ENDOSCOPY;  Service: Endoscopy;  Laterality: N/A;   COLONOSCOPY WITH PROPOFOL  N/A 09/16/2022   Procedure: COLONOSCOPY WITH PROPOFOL ;  Surgeon: Therisa Bi, MD;   Location: [facility] ENDOSCOPY;  Service: Endoscopy;  Laterality: N/A;   ESOPHAGOGASTRODUODENOSCOPY (EGD) WITH PROPOFOL  N/A 09/15/2022   Procedure: ESOPHAGOGASTRODUODENOSCOPY (EGD) WITH PROPOFOL ;  Surgeon: Onita Elspeth Sharper, DO;  Location: [facility] ENDOSCOPY;  Service: Gastroenterology;  Laterality: N/A;   fractured tibia Left    repair   ROTATOR CUFF REPAIR Left    TEE WITHOUT CARDIOVERSION N/A 09/09/2018   Procedure: TRANSESOPHAGEAL ECHOCARDIOGRAM (TEE);  Surgeon: Bosie Vinie LABOR, MD;  Location: [facility] ORS;  Service: Cardiovascular;  Laterality: N/A;   TUBAL LIGATION     VIDEO BRONCHOSCOPY WITH ENDOBRONCHIAL NAVIGATION Bilateral 05/20/2024   Procedure: VIDEO BRONCHOSCOPY WITH ENDOBRONCHIAL NAVIGATION;  Surgeon: Isadora Hose, MD;  Location: [facility] ORS;  Service: Pulmonary;  Laterality: Bilateral;    SOCIAL HISTORY: Social History   Socioeconomic History   Marital status: Widowed    Spouse name: Not on file   Number of children: Not on file   Years of education: Not on file   Highest education level: Not on file  Occupational History   Occupation: retired     Comment: Conservation officer, nature    Comment: school bus driver  Tobacco Use   Smoking status: Former    Current packs/day: 0.00    Average packs/day: 1 pack/day for 40.0  years (40.0 ttl pk-yrs)    Types: Cigarettes    Quit date: 2010    Years since quitting: 15.7   Smokeless tobacco: Never  Vaping Use   Vaping status: Never Used  Substance and Sexual Activity   Alcohol use: No   Drug use: No   Sexual activity: Not Currently    Birth control/protection: None  Other Topics Concern   Not on file  Social History Narrative   Lives alone   Social Drivers of Health   Financial Resource Strain: Medium Risk (11/23/2023)   Received from [facility]   Overall Financial Resource Strain (CARDIA)    Difficulty of Paying Living Expenses: Somewhat hard  Food Insecurity: No Food Insecurity (05/30/2024)   Hunger Vital Sign    Worried  About Running Out of Food in the Last Year: Never true    Ran Out of Food in the Last Year: Never true  Transportation Needs: Unmet Transportation Needs (05/30/2024)   PRAPARE - Administrator, Civil Service (Medical): Yes    Lack of Transportation (Non-Medical): No  Physical Activity: Unknown (09/09/2018)   Exercise Vital Sign    Days of Exercise per Week: 0 days    Minutes of Exercise per Session: Not on file  Stress: Not on file  Social Connections: Unknown (09/09/2018)   Social Connection and Isolation Panel    Frequency of Communication with Friends and Family: More than three times a week    Frequency of Social Gatherings with Friends and Family: Once a week    Attends Religious Services: Never    Database administrator or Organizations: Not on file    Attends Banker Meetings: Not on file    Marital Status: Widowed  Intimate Partner Violence: Not At Risk (05/30/2024)   Humiliation, Afraid, Rape, and Kick questionnaire    Fear of Current or Ex-Partner: No    Emotionally Abused: No    Physically Abused: No    Sexually Abused: No    FAMILY HISTORY: Family History  Problem Relation Age of Onset   Heart disease Mother        died at 96   Diabetes Mother    Alzheimer's disease Father        died at 61   Parkinson's disease Sister    Heart disease Brother        died at 1   Diabetes Brother    Kidney disease Sister    Breast cancer Sister     ALLERGIES:  is allergic to bee venom, hydrocodone-acetaminophen , lactose, lactose intolerance (gi), percocet [oxycodone -acetaminophen ], vicodin [hydrocodone-acetaminophen ], and penicillins.  MEDICATIONS:  Current Outpatient Medications  Medication Sig Dispense Refill   albuterol  (PROVENTIL  HFA;VENTOLIN  HFA) 108 (90 Base) MCG/ACT inhaler Inhale 2-4 puffs by mouth every 4 hours as needed for wheezing, cough, and/or shortness of breath 1 Inhaler 1   alendronate (FOSAMAX) 70 MG tablet Take 70 mg by mouth once a  week.     Ascorbic Acid  (VITAMIN C ) 1000 MG tablet Take 1,000 mg by mouth daily.     aspirin  EC 81 MG EC tablet Take 1 tablet (81 mg total) by mouth daily.     atorvastatin  (LIPITOR) 80 MG tablet Take 80 mg by mouth at bedtime.     azithromycin  (ZITHROMAX ) 250 MG tablet 2 tabs today, then 1 tab daily on days 2-5 6 tablet 0   Cholecalciferol  25 MCG (1000 UT) tablet Take 1,000 Units by mouth daily.  diltiazem  (CARDIZEM  CD) 300 MG 24 hr capsule Take 1 capsule (300 mg total) by mouth at bedtime. 30 capsule 1   DULoxetine  (CYMBALTA ) 60 MG capsule Take 60 mg by mouth every morning.     ezetimibe (ZETIA) 10 MG tablet Take 10 mg by mouth daily.     famotidine  (PEPCID ) 40 MG tablet Take 40 mg by mouth daily.     fluticasone  (FLONASE ) 50 MCG/ACT nasal spray Place 1 spray into both nostrils daily as needed for allergies.     fluticasone  furoate-vilanterol (BREO ELLIPTA ) 100-25 MCG/INH AEPB Inhale 1 puff into the lungs daily.     guaiFENesin  (MUCINEX ) 600 MG 12 hr tablet Take 1 tablet (600 mg total) by mouth 2 (two) times daily as needed.     hydrocortisone 2.5 % cream Apply 1 Application topically 3 (three) times daily.     ipratropium-albuterol  (DUONEB) 0.5-2.5 (3) MG/3ML SOLN Take 3 mLs by nebulization every 6 (six) hours as needed. 360 mL 1   lisinopril  (ZESTRIL ) 20 MG tablet Take 20 mg by mouth daily.     montelukast  (SINGULAIR ) 10 MG tablet Take 10 mg by mouth daily.     Multiple Vitamin (MULTI-VITAMIN DAILY PO) Take 1 tablet by mouth daily.     omeprazole (PRILOSEC) 20 MG capsule Take 20 mg by mouth daily.     oxybutynin  (DITROPAN  XL) 15 MG 24 hr tablet Take 15 mg by mouth daily.     potassium chloride  SA (K-DUR,KLOR-CON ) 20 MEQ tablet Take 1 tablet (20 mEq total) by mouth daily. 15 tablet 0   traMADol  (ULTRAM ) 50 MG tablet Take 1 tablet (50 mg total) by mouth every 6 (six) hours as needed. 5 tablet 0   umeclidinium bromide  (INCRUSE ELLIPTA ) 62.5 MCG/INH AEPB Inhale 1 puff into the lungs daily.      vitamin B-12 (CYANOCOBALAMIN ) 1000 MCG tablet Take 1,000 mcg by mouth daily.      No current facility-administered medications for this visit.    Review of Systems  Constitutional:  Negative for appetite change, chills, fatigue and fever.  HENT:   Negative for hearing loss and voice change.   Eyes:  Negative for eye problems.  Respiratory:  Positive for shortness of breath. Negative for chest tightness and cough.   Cardiovascular:  Negative for chest pain.  Gastrointestinal:  Negative for abdominal distention, abdominal pain and blood in stool.  Endocrine: Negative for hot flashes.  Genitourinary:  Negative for difficulty urinating and frequency.   Musculoskeletal:  Negative for arthralgias.  Skin:  Negative for itching and rash.  Neurological:  Negative for extremity weakness.  Hematological:  Negative for adenopathy.  Psychiatric/Behavioral:  Negative for confusion.      PHYSICAL EXAMINATION: ECOG PERFORMANCE STATUS: 1 - Symptomatic but completely ambulatory  Vitals:   05/30/24 1118  BP: 120/66  Pulse: 100  Resp: 17  Temp: 98.9 F (37.2 C)  SpO2: 97%   Filed Weights   05/30/24 1118  Weight: 188 lb (85.3 kg)    Physical Exam Constitutional:      General: She is not in acute distress.    Appearance: She is not diaphoretic.  HENT:     Head: Normocephalic and atraumatic.     Nose: Nose normal.     Mouth/Throat:     Pharynx: No oropharyngeal exudate.  Eyes:     General: No scleral icterus.    Pupils: Pupils are equal, round, and reactive to light.  Cardiovascular:     Rate and Rhythm: Normal rate.  Heart sounds: No murmur heard. Pulmonary:     Effort: Pulmonary effort is normal. No respiratory distress.     Breath sounds: Normal breath sounds. No rales.     Comments: Decreased breath sound bilaterally. Nasal cannula oxygen  Chest:     Chest wall: No tenderness.  Abdominal:     General: There is no distension.     Palpations: Abdomen is soft.      Tenderness: There is no abdominal tenderness.  Musculoskeletal:        General: Normal range of motion.     Cervical back: Normal range of motion and neck supple.  Skin:    General: Skin is warm and dry.     Findings: No erythema.  Neurological:     Mental Status: She is alert and oriented to person, place, and time.     Cranial Nerves: No cranial nerve deficit.     Motor: No abnormal muscle tone.     Coordination: Coordination normal.  Psychiatric:        Mood and Affect: Affect normal.      LABORATORY DATA:  I have reviewed the data as listed    Latest Ref Rng & Units 05/20/2024    2:02 PM 05/05/2023    1:04 AM 05/04/2023    6:59 PM  CBC  WBC 4.0 - 10.5 K/uL 11.8  8.2  9.4   Hemoglobin 12.0 - 15.0 g/dL 87.1  87.7  87.9   Hematocrit 36.0 - 46.0 % 39.4  37.9  37.7   Platelets 150 - 400 K/uL 271  239  246       Latest Ref Rng & Units 05/30/2024   12:04 PM 05/20/2024    2:02 PM 05/06/2023    4:53 AM  CMP  Glucose 70 - 99 mg/dL  872  856   BUN 8 - 23 mg/dL  21  22   Creatinine 9.55 - 1.00 mg/dL  8.97  8.98   Sodium 864 - 145 mmol/L  141  141   Potassium 3.5 - 5.1 mmol/L  3.8  3.9   Chloride 98 - 111 mmol/L  104  105   CO2 22 - 32 mmol/L  26  28   Calcium  8.9 - 10.3 mg/dL  9.2  9.0   Total Protein 6.5 - 8.1 g/dL 7.1     Total Bilirubin 0.0 - 1.2 mg/dL 0.7     Alkaline Phos 38 - 126 U/L 47     AST 15 - 41 U/L 24     ALT 0 - 44 U/L 18        RADIOGRAPHIC STUDIES: I have personally reviewed the radiological images as listed and agreed with the findings in the report. DG Chest Port 1 View Result Date: 05/20/2024 CLINICAL DATA:  Post bronchoscopy EXAM: PORTABLE CHEST 1 VIEW COMPARISON:  CT chest 05/09/2024.  Chest radiograph 05/09/2023 FINDINGS: Shallow inspiration. Heart size and pulmonary vascularity are normal. Emphysematous changes in the lungs with scattered fibrosis. Patchy infiltrates demonstrated in both lung bases, greater on the right. Similar appearance to prior study  suggesting probable fibrosis. No pleural effusion or pneumothorax. Mediastinal contours appear intact. Calcification of the aorta. Degenerative changes in the spine and shoulders. IMPRESSION: Emphysematous changes and chronic fibrosis in the lungs. Similar appearance to previous studies. Electronically Signed   By: Elsie Gravely M.D.   On: 05/20/2024 17:22   DG C-Arm 1-60 Min-No Report Result Date: 05/20/2024 Fluoroscopy was utilized by the requesting physician.  No radiographic  interpretation.   CT SUPER D CHEST WO CONTRAST Result Date: 05/16/2024 EXAM: CT CHEST WITHOUT CONTRAST 05/09/2024 09:18:07 AM TECHNIQUE: CT of the chest was performed without the administration of intravenous contrast. Multiplanar reformatted images are provided for review. Automated exposure control, iterative reconstruction, and/or weight based adjustment of the mA/kV was utilized to reduce the radiation dose to as low as reasonably achievable. COMPARISON: PET CT dated 07/25/2024 and CT chest dated 09/05/2023. CLINICAL HISTORY: Lung nodule. FINDINGS: MEDIASTINUM: Heart and pericardium are unremarkable. The central airways are clear. Mild coronary atherosclerosis of the LAD (left anterior descending) and left circumflex arteries. Thoracic aortic atherosclerosis. LYMPH NODES: No suspicious mediastinal lymphadenopathy. LUNGS AND PLEURA: Severe centrilobular and paraseptal emphysematous changes. 2.3 x 1.2 cm central left upper lobe / perihilar nodule, favored to be mildly progressive, hypermetabolic on prior PET, suspicious. 7 mm subpleural left lower lobe nodule, grossly unchanged, possibly benign. No focal consolidation or pulmonary edema. No pleural effusion or pneumothorax. SOFT TISSUES/BONES: No acute abnormality of the bones or soft tissues. UPPER ABDOMEN: 2.6 cm central right hepatic cyst, likely benign. 3.3 cm medial right upper pole renal cyst, poorly evaluated, likely benign. IMPRESSION: 1. 2.3 x 1.2 cm central left upper  lobe/perihilar nodule, favored to be mildly progressive and hypermetabolic on prior PET, suspicious for primary bronchogenic carcinomina. 2. 7 mm subpleural left lower lobe nodule, grossly unchanged, possibly benign. 3. No findings suspicious for metastatic disease. Electronically signed by: Pinkie Pebbles MD 05/16/2024 08:23 PM EDT RP Workstation: [id number]   NM PET Image Restage (PS) Skull Base to Thigh (F-18 FDG) Result Date: 05/15/2024 CLINICAL DATA:  Subsequent treatment strategy for lung nodule. EXAM: NUCLEAR MEDICINE PET SKULL BASE TO THIGH TECHNIQUE: 10.3 mCi F-18 FDG was injected intravenously. Full-ring PET imaging was performed from the skull base to thigh after the radiotracer. CT data was obtained and used for attenuation correction and anatomic localization. Fasting blood glucose: 110 mg/dl COMPARISON:  8/71/7974 FINDINGS: Mediastinal blood pool activity: SUV max 2.6 Liver activity: SUV max NA NECK: No significant abnormal hypermetabolic activity in this region. Incidental CT findings: Bilateral common carotid atheromatous vascular calcification. CHEST: Left upper lobe hilar nodule 2.3 by 1.2 cm on image 53 series 6 with maximum SUV 11.6, formerly 1.7 by 1.0 cm with maximum SUV 9.7. Long segment accentuated esophageal activity is likely physiologic. Stable 8 by 5 mm left lower lobe nodule on image 60 of series 6 is not substantially hypermetabolic although is just below sensitive PET-CT size thresholds. Incidental CT findings: Emphysema. Scarring in the right lower lobe and right middle lobe. Coronary, aortic arch, and branch vessel atherosclerotic vascular disease. ABDOMEN/PELVIS: No significant abnormal hypermetabolic activity in this region. Incidental CT findings: Photopenic 2.5 cm hepatic cyst, image 73 series 6. Atherosclerosis is present, including aortoiliac atherosclerotic disease. Stable Bosniak category IIF cystic lesion of the right kidney lower pole with thin internal septations and  some faint calcification. Renal protocol MRI with and without contrast is recommended in 6 months time for surveillance. No significant associated hypermetabolic activity. Small umbilical hernia contains adipose tissues. Nonobstructive right nephrolithiasis. SKELETON: No significant abnormal hypermetabolic activity in this region. Incidental CT findings: None. IMPRESSION: 1. The left upper lobe hilar nodule is mildly increased in size and metabolic activity compared to the prior exam, currently 2.3 by 1.2 cm with maximum SUV 11.6, formerly 1.7 by 1.0 cm with maximum SUV 9.7. This is suspicious for malignancy. 2. Stable 0.8 by 0.5 cm left lower lobe nodule is not substantially hypermetabolic although is  just below sensitive PET-CT size thresholds. 3. Stable Bosniak category IIF cystic lesion of the right kidney lower pole with thin internal septations and some faint calcification. Renal protocol MRI with and without contrast is recommended in 6 months time for surveillance. 4. Nonobstructive right nephrolithiasis. 5. Aortic Atherosclerosis (ICD10-I70.0) and Emphysema (ICD10-J43.9). Electronically Signed   By: Ryan Salvage M.D.   On: 05/15/2024 18:39

## 2024-05-30 NOTE — Assessment & Plan Note (Signed)
 Patient is on nasal cannula oxygen .  COPD continue inhalers.  Follow-up with pulmonology

## 2024-05-30 NOTE — Assessment & Plan Note (Signed)
 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  Patient already has appointment with thoracic surgeon for evaluation of resection. She is likely not a good candidate due to chronic respiratory failure. Refer to radiation oncology for evaluation of SBRT.

## 2024-06-02 LAB — FUNGUS CULTURE RESULT

## 2024-06-02 LAB — FUNGUS CULTURE WITH STAIN

## 2024-06-02 LAB — FUNGAL ORGANISM REFLEX

## 2024-06-03 ENCOUNTER — Encounter: Payer: Self-pay | Admitting: Student in an Organized Health Care Education/Training Program

## 2024-06-03 NOTE — Telephone Encounter (Signed)
 FMLA fax received 06/03/2024.

## 2024-06-05 DIAGNOSIS — J449 Chronic obstructive pulmonary disease, unspecified: Secondary | ICD-10-CM | POA: Diagnosis not present

## 2024-06-06 ENCOUNTER — Ambulatory Visit
Attending: Thoracic Surgery (Cardiothoracic Vascular Surgery) | Admitting: Thoracic Surgery (Cardiothoracic Vascular Surgery)

## 2024-06-06 VITALS — BP 120/68 | HR 89 | Resp 18 | Ht 64.0 in | Wt 188.0 lb

## 2024-06-06 DIAGNOSIS — R911 Solitary pulmonary nodule: Secondary | ICD-10-CM | POA: Diagnosis not present

## 2024-06-06 NOTE — Progress Notes (Signed)
 [address]       [place] [address]             [phone]                    Audrey Sexton [facility] Record #[id number] Date of Birth: 11/03/1950  Referring: Isadora Hose, MD Primary Care: Stephanie Charlene CROME, MD Primary Cardiologist: None  Chief Complaint:    Chief Complaint  Patient presents with   Lung Lesion    Review workup    History of Present Illness:    Audrey Sexton is a 73 y.o. female who presents for surgical evaluation of a biopsy-proven left upper lobe squamous cell carcinoma.  She presents today in a wheelchair and on supplemental oxygen .    Zubrod Score: At the time of surgery this patient's most appropriate activity status/level should be described as: []     0    Normal activity, no symptoms []     1    Restricted in physical strenuous activity but ambulatory, able to do out light work []     2    Ambulatory and capable of self care, unable to do work activities, up and about               >50 % of waking hours                              [x]     3    Only limited self care, in bed greater than 50% of waking hours []     4    Completely disabled, no self care, confined to bed or chair []     5    Moribund     Past Medical History:  Diagnosis Date   Anemia    Anxiety    Arthritis    Asthma    Carpal tunnel syndrome of left wrist    COPD (chronic obstructive pulmonary disease) (HCC)    Dyspnea    History of kidney stones    Hyperlipemia    Hypertension    Pneumonia    Stroke [facility])    Jan. 2020    Past Surgical History:  Procedure Laterality Date   CARPAL TUNNEL RELEASE Left    COLONOSCOPY N/A 09/07/2020   Procedure: COLONOSCOPY;  Surgeon: Maryruth Ole DASEN, MD;  Location: [facility] ENDOSCOPY;  Service: Endoscopy;  Laterality: N/A;   COLONOSCOPY WITH PROPOFOL  N/A 09/16/2022   Procedure: COLONOSCOPY WITH PROPOFOL ;  Surgeon: Therisa Bi, MD;  Location: [facility] ENDOSCOPY;  Service: Endoscopy;  Laterality: N/A;    ESOPHAGOGASTRODUODENOSCOPY (EGD) WITH PROPOFOL  N/A 09/15/2022   Procedure: ESOPHAGOGASTRODUODENOSCOPY (EGD) WITH PROPOFOL ;  Surgeon: Onita Elspeth Sharper, DO;  Location: [facility] ENDOSCOPY;  Service: Gastroenterology;  Laterality: N/A;   fractured tibia Left    repair   ROTATOR CUFF REPAIR Left    TEE WITHOUT CARDIOVERSION N/A 09/09/2018   Procedure: TRANSESOPHAGEAL ECHOCARDIOGRAM (TEE);  Surgeon: Bosie Vinie LABOR, MD;  Location: [facility] ORS;  Service: Cardiovascular;  Laterality: N/A;   TUBAL LIGATION     VIDEO BRONCHOSCOPY WITH ENDOBRONCHIAL NAVIGATION Bilateral 05/20/2024   Procedure: VIDEO BRONCHOSCOPY WITH ENDOBRONCHIAL NAVIGATION;  Surgeon: Isadora Hose, MD;  Location: [facility] ORS;  Service: Pulmonary;  Laterality: Bilateral;    Family History  Problem Relation Age of Onset   Heart disease Mother        died at 37   Diabetes Mother  Alzheimer's disease Father        died at 58   Parkinson's disease Sister    Heart disease Brother        died at 15   Diabetes Brother    Kidney disease Sister    Breast cancer Sister      Social History   Tobacco Use  Smoking Status Former   Current packs/day: 0.00   Average packs/day: 1 pack/day for 40.0 years (40.0 ttl pk-yrs)   Types: Cigarettes   Quit date: 2010   Years since quitting: 15.7  Smokeless Tobacco Never    Social History   Substance and Sexual Activity  Alcohol Use No     Allergies  Allergen Reactions   Bee Venom Shortness Of Breath and Swelling   Hydrocodone-Acetaminophen  Nausea And Vomiting   Lactose Diarrhea   Lactose Intolerance (Gi) Diarrhea   Percocet [Oxycodone -Acetaminophen ] Nausea And Vomiting   Vicodin [Hydrocodone-Acetaminophen ] Nausea And Vomiting   Penicillins Hives    Has patient had a PCN reaction causing immediate rash, facial/tongue/throat swelling, SOB or lightheadedness with hypotension: no  Has patient had a PCN reaction causing severe rash involving mucus membranes or skin necrosis: no  Has  patient had a PCN reaction that required hospitalization  no  Has patient had a PCN reaction occurring within the last 10 years: no  If all of the above answers are NO, then may proceed with Cephalosporin use.  Has patient had a PCN reaction causing immediate rash, facial/tongue/throat swelling, SOB or lightheadedness with hypotension: no Has patient had a PCN reaction causing severe rash involving mucus membranes or skin necrosis: no Has patient had a PCN reaction that required hospitalization  no Has patient had a PCN reaction occurring within the last 10 years: no If all of the above answers are NO, then may proceed with Cephalosporin use.    Current Outpatient Medications  Medication Sig Dispense Refill   albuterol  (PROVENTIL  HFA;VENTOLIN  HFA) 108 (90 Base) MCG/ACT inhaler Inhale 2-4 puffs by mouth every 4 hours as needed for wheezing, cough, and/or shortness of breath 1 Inhaler 1   alendronate (FOSAMAX) 70 MG tablet Take 70 mg by mouth once a week.     Ascorbic Acid  (VITAMIN C ) 1000 MG tablet Take 1,000 mg by mouth daily.     aspirin  EC 81 MG EC tablet Take 1 tablet (81 mg total) by mouth daily.     atorvastatin  (LIPITOR) 80 MG tablet Take 80 mg by mouth at bedtime.     azithromycin  (ZITHROMAX ) 250 MG tablet 2 tabs today, then 1 tab daily on days 2-5 6 tablet 0   Cholecalciferol  25 MCG (1000 UT) tablet Take 1,000 Units by mouth daily.     diltiazem  (CARDIZEM  CD) 300 MG 24 hr capsule Take 1 capsule (300 mg total) by mouth at bedtime. 30 capsule 1   DULoxetine  (CYMBALTA ) 60 MG capsule Take 60 mg by mouth every morning.     ezetimibe (ZETIA) 10 MG tablet Take 10 mg by mouth daily.     famotidine  (PEPCID ) 40 MG tablet Take 40 mg by mouth daily.     fluticasone  (FLONASE ) 50 MCG/ACT nasal spray Place 1 spray into both nostrils daily as needed for allergies.     fluticasone  furoate-vilanterol (BREO ELLIPTA ) 100-25 MCG/INH AEPB Inhale 1 puff into the lungs daily.     guaiFENesin  (MUCINEX )  600 MG 12 hr tablet Take 1 tablet (600 mg total) by mouth 2 (two) times daily as needed.  hydrocortisone 2.5 % cream Apply 1 Application topically 3 (three) times daily.     ipratropium-albuterol  (DUONEB) 0.5-2.5 (3) MG/3ML SOLN Take 3 mLs by nebulization every 6 (six) hours as needed. 360 mL 1   lisinopril  (ZESTRIL ) 20 MG tablet Take 20 mg by mouth daily.     montelukast  (SINGULAIR ) 10 MG tablet Take 10 mg by mouth daily.     Multiple Vitamin (MULTI-VITAMIN DAILY PO) Take 1 tablet by mouth daily.     omeprazole (PRILOSEC) 20 MG capsule Take 20 mg by mouth daily.     oxybutynin  (DITROPAN  XL) 15 MG 24 hr tablet Take 15 mg by mouth daily.     potassium chloride  SA (K-DUR,KLOR-CON ) 20 MEQ tablet Take 1 tablet (20 mEq total) by mouth daily. 15 tablet 0   traMADol  (ULTRAM ) 50 MG tablet Take 1 tablet (50 mg total) by mouth every 6 (six) hours as needed. 5 tablet 0   umeclidinium bromide  (INCRUSE ELLIPTA ) 62.5 MCG/INH AEPB Inhale 1 puff into the lungs daily.     vitamin B-12 (CYANOCOBALAMIN ) 1000 MCG tablet Take 1,000 mcg by mouth daily.      No current facility-administered medications for this visit.    Review of Systems  Respiratory:  Positive for cough and shortness of breath.      PHYSICAL EXAMINATION: BP 120/68 (BP Location: Left Arm)   Pulse 89   Resp 18   Ht 5' 4 (1.626 m)   Wt 188 lb (85.3 kg)   SpO2 93% Comment: 3L O2  BMI 32.27 kg/m  Physical Exam Constitutional:      General: She is not in acute distress.    Appearance: She is ill-appearing. She is not toxic-appearing.     Comments: Frail  Cardiovascular:     Rate and Rhythm: Normal rate.  Abdominal:     General: There is no distension.  Musculoskeletal:     Comments: Presents today in a wheelchair          I have independently reviewed the above radiology studies  and reviewed the findings with the patient.   Recent Lab Findings: Lab Results  Component Value Date   WBC 11.8 (H) 05/20/2024   HGB 12.8  05/20/2024   HCT 39.4 05/20/2024   PLT 271 05/20/2024   GLUCOSE 127 (H) 05/20/2024   CHOL 130 09/06/2018   TRIG 146 09/06/2018   HDL 33 (L) 09/06/2018   LDLCALC 68 09/06/2018   ALT 18 05/30/2024   AST 24 05/30/2024   NA 141 05/20/2024   K 3.8 05/20/2024   CL 104 05/20/2024   CREATININE 1.02 (H) 05/20/2024   BUN 21 05/20/2024   CO2 26 05/20/2024   INR 1.1 09/14/2022   HGBA1C 6.7 (H) 09/06/2018    Diagnostic Studies & Laboratory data:     Recent Radiology Findings:   DG Chest Port 1 View Result Date: 05/20/2024 CLINICAL DATA:  Post bronchoscopy EXAM: PORTABLE CHEST 1 VIEW COMPARISON:  CT chest 05/09/2024.  Chest radiograph 05/09/2023 FINDINGS: Shallow inspiration. Heart size and pulmonary vascularity are normal. Emphysematous changes in the lungs with scattered fibrosis. Patchy infiltrates demonstrated in both lung bases, greater on the right. Similar appearance to prior study suggesting probable fibrosis. No pleural effusion or pneumothorax. Mediastinal contours appear intact. Calcification of the aorta. Degenerative changes in the spine and shoulders. IMPRESSION: Emphysematous changes and chronic fibrosis in the lungs. Similar appearance to previous studies. Electronically Signed   By: Elsie Gravely M.D.   On: 05/20/2024 17:22  DG C-Arm 1-60 Min-No Report Result Date: 05/20/2024 Fluoroscopy was utilized by the requesting physician.  No radiographic interpretation.   CT SUPER D CHEST WO CONTRAST Result Date: 05/16/2024 EXAM: CT CHEST WITHOUT CONTRAST 05/09/2024 09:18:07 AM TECHNIQUE: CT of the chest was performed without the administration of intravenous contrast. Multiplanar reformatted images are provided for review. Automated exposure control, iterative reconstruction, and/or weight based adjustment of the mA/kV was utilized to reduce the radiation dose to as low as reasonably achievable. COMPARISON: PET CT dated 07/25/2024 and CT chest dated 09/05/2023. CLINICAL HISTORY: Lung  nodule. FINDINGS: MEDIASTINUM: Heart and pericardium are unremarkable. The central airways are clear. Mild coronary atherosclerosis of the LAD (left anterior descending) and left circumflex arteries. Thoracic aortic atherosclerosis. LYMPH NODES: No suspicious mediastinal lymphadenopathy. LUNGS AND PLEURA: Severe centrilobular and paraseptal emphysematous changes. 2.3 x 1.2 cm central left upper lobe / perihilar nodule, favored to be mildly progressive, hypermetabolic on prior PET, suspicious. 7 mm subpleural left lower lobe nodule, grossly unchanged, possibly benign. No focal consolidation or pulmonary edema. No pleural effusion or pneumothorax. SOFT TISSUES/BONES: No acute abnormality of the bones or soft tissues. UPPER ABDOMEN: 2.6 cm central right hepatic cyst, likely benign. 3.3 cm medial right upper pole renal cyst, poorly evaluated, likely benign. IMPRESSION: 1. 2.3 x 1.2 cm central left upper lobe/perihilar nodule, favored to be mildly progressive and hypermetabolic on prior PET, suspicious for primary bronchogenic carcinomina. 2. 7 mm subpleural left lower lobe nodule, grossly unchanged, possibly benign. 3. No findings suspicious for metastatic disease. Electronically signed by: Pinkie Pebbles MD 05/16/2024 08:23 PM EDT RP Workstation: [id number]   NM PET Image Restage (PS) Skull Base to Thigh (F-18 FDG) Result Date: 05/15/2024 CLINICAL DATA:  Subsequent treatment strategy for lung nodule. EXAM: NUCLEAR MEDICINE PET SKULL BASE TO THIGH TECHNIQUE: 10.3 mCi F-18 FDG was injected intravenously. Full-ring PET imaging was performed from the skull base to thigh after the radiotracer. CT data was obtained and used for attenuation correction and anatomic localization. Fasting blood glucose: 110 mg/dl COMPARISON:  8/71/7974 FINDINGS: Mediastinal blood pool activity: SUV max 2.6 Liver activity: SUV max NA NECK: No significant abnormal hypermetabolic activity in this region. Incidental CT findings: Bilateral  common carotid atheromatous vascular calcification. CHEST: Left upper lobe hilar nodule 2.3 by 1.2 cm on image 53 series 6 with maximum SUV 11.6, formerly 1.7 by 1.0 cm with maximum SUV 9.7. Long segment accentuated esophageal activity is likely physiologic. Stable 8 by 5 mm left lower lobe nodule on image 60 of series 6 is not substantially hypermetabolic although is just below sensitive PET-CT size thresholds. Incidental CT findings: Emphysema. Scarring in the right lower lobe and right middle lobe. Coronary, aortic arch, and branch vessel atherosclerotic vascular disease. ABDOMEN/PELVIS: No significant abnormal hypermetabolic activity in this region. Incidental CT findings: Photopenic 2.5 cm hepatic cyst, image 73 series 6. Atherosclerosis is present, including aortoiliac atherosclerotic disease. Stable Bosniak category IIF cystic lesion of the right kidney lower pole with thin internal septations and some faint calcification. Renal protocol MRI with and without contrast is recommended in 6 months time for surveillance. No significant associated hypermetabolic activity. Small umbilical hernia contains adipose tissues. Nonobstructive right nephrolithiasis. SKELETON: No significant abnormal hypermetabolic activity in this region. Incidental CT findings: None. IMPRESSION: 1. The left upper lobe hilar nodule is mildly increased in size and metabolic activity compared to the prior exam, currently 2.3 by 1.2 cm with maximum SUV 11.6, formerly 1.7 by 1.0 cm with maximum SUV 9.7. This is suspicious  for malignancy. 2. Stable 0.8 by 0.5 cm left lower lobe nodule is not substantially hypermetabolic although is just below sensitive PET-CT size thresholds. 3. Stable Bosniak category IIF cystic lesion of the right kidney lower pole with thin internal septations and some faint calcification. Renal protocol MRI with and without contrast is recommended in 6 months time for surveillance. 4. Nonobstructive right nephrolithiasis. 5.  Aortic Atherosclerosis (ICD10-I70.0) and Emphysema (ICD10-J43.9). Electronically Signed   By: Ryan Salvage M.D.   On: 05/15/2024 18:39     PFTs:  - FVC: 72% - FEV1: 47% -DLCO: 46%    Assessment / Plan:   73 y.o. female with left upper lobe non-small cell lung cancer.  She also has severe COPD and is currently on supplemental oxygen  at home.  Based on her pulmonary function test that she would not be a candidate for surgical resection.  The tumor is also very central and thus would require lobectomy.  Given her functional status she would not have a good outcome with surgery.  She will be referred to radiation oncology.     I  spent 15 minutes with  the patient face to face in counseling and coordination of care.    Audrey Sexton 06/06/2024 12:22 PM

## 2024-06-09 ENCOUNTER — Ambulatory Visit
Admission: RE | Admit: 2024-06-09 | Discharge: 2024-06-09 | Disposition: A | Discharge status: Home / Self Care | Source: Ambulatory Visit | Attending: Radiation Oncology | Admitting: Radiation Oncology

## 2024-06-09 ENCOUNTER — Encounter: Payer: Self-pay | Admitting: Radiation Oncology

## 2024-06-09 ENCOUNTER — Encounter: Payer: Self-pay | Admitting: *Deleted

## 2024-06-09 VITALS — BP 121/74 | HR 99 | Temp 97.0°F | Resp 20

## 2024-06-09 DIAGNOSIS — Z87891 Personal history of nicotine dependence: Secondary | ICD-10-CM | POA: Diagnosis not present

## 2024-06-09 DIAGNOSIS — C3412 Malignant neoplasm of upper lobe, left bronchus or lung: Secondary | ICD-10-CM | POA: Insufficient documentation

## 2024-06-09 DIAGNOSIS — C3490 Malignant neoplasm of unspecified part of unspecified bronchus or lung: Secondary | ICD-10-CM

## 2024-06-09 DIAGNOSIS — Z510 Encounter for antineoplastic radiation therapy: Secondary | ICD-10-CM | POA: Diagnosis present

## 2024-06-09 DIAGNOSIS — C3492 Malignant neoplasm of unspecified part of left bronchus or lung: Secondary | ICD-10-CM | POA: Diagnosis not present

## 2024-06-09 NOTE — Consult Note (Signed)
 NEW PATIENT EVALUATION  Name: Audrey Sexton  MRN: [id number]  Date:   06/09/2024     DOB: 11/12/1950   This 73 y.o. female patient presents to the clinic for initial evaluation of stage I A3 (cT1c N0 M0) squamous cell carcinoma of the left lung.  REFERRING PHYSICIAN: Hamrick, Charlene CROME, MD  CHIEF COMPLAINT:  Chief Complaint  Patient presents with   Lung Cancer    DIAGNOSIS: The encounter diagnosis was Squamous cell carcinoma of lung, unspecified laterality (HCC).   PREVIOUS INVESTIGATIONS:  CT scans PET CT scan reviewed Pathology reports reviewed Clinical notes reviewed  HPI: Patient is a 73 year old female who presented with some increasing cough was found on screening CT scan to have an abnormal mass in the central aspect of the left upper lobe.  I believe she was lost to follow-up as this screening CT scan was back in January.  She recently had a PET CT scan back in September showing left upper lobe hilar nodule mildly increased in size and metabolic activity compared to prior examination.  She also has stable 0.8 cm left lower lobe nodule although not substantially less hypermetabolic.  She underwent bronchoscopy which was positive for squamous cell carcinoma.  She has been seen by thoracic surgery and declined for surgery based on her overall general condition and central location of the tumor.  Her central tumor now measures 2.3 x 1.2 cm.  She has no evidence of metastatic disease by PET/CT criteria.  She is now referred to radiation oncology for consideration of treatment.  She continues to have a mild cough no hemoptysis or chest tightness.  Patient is on continuous nasal oxygen  has history of COPD emphysema.  PLANNED TREATMENT REGIMEN: Ultra hypofractionated IMRT radiation therapy  PAST MEDICAL HISTORY:  has a past medical history of Anemia, Anxiety, Arthritis, Asthma, Carpal tunnel syndrome of left wrist, COPD (chronic obstructive pulmonary disease) (HCC), Dyspnea, History of  kidney stones, Hyperlipemia, Hypertension, Pneumonia, and Stroke (HCC).    PAST SURGICAL HISTORY:  Past Surgical History:  Procedure Laterality Date   CARPAL TUNNEL RELEASE Left    COLONOSCOPY N/A 09/07/2020   Procedure: COLONOSCOPY;  Surgeon: Maryruth Ole DASEN, MD;  Location: [facility] ENDOSCOPY;  Service: Endoscopy;  Laterality: N/A;   COLONOSCOPY WITH PROPOFOL  N/A 09/16/2022   Procedure: COLONOSCOPY WITH PROPOFOL ;  Surgeon: Therisa Bi, MD;  Location: [facility] ENDOSCOPY;  Service: Endoscopy;  Laterality: N/A;   ESOPHAGOGASTRODUODENOSCOPY (EGD) WITH PROPOFOL  N/A 09/15/2022   Procedure: ESOPHAGOGASTRODUODENOSCOPY (EGD) WITH PROPOFOL ;  Surgeon: Onita Elspeth Sharper, DO;  Location: [facility] ENDOSCOPY;  Service: Gastroenterology;  Laterality: N/A;   fractured tibia Left    repair   ROTATOR CUFF REPAIR Left    TEE WITHOUT CARDIOVERSION N/A 09/09/2018   Procedure: TRANSESOPHAGEAL ECHOCARDIOGRAM (TEE);  Surgeon: Bosie Vinie LABOR, MD;  Location: [facility] ORS;  Service: Cardiovascular;  Laterality: N/A;   TUBAL LIGATION     VIDEO BRONCHOSCOPY WITH ENDOBRONCHIAL NAVIGATION Bilateral 05/20/2024   Procedure: VIDEO BRONCHOSCOPY WITH ENDOBRONCHIAL NAVIGATION;  Surgeon: Isadora Hose, MD;  Location: [facility] ORS;  Service: Pulmonary;  Laterality: Bilateral;    FAMILY HISTORY: family history includes Alzheimer's disease in her father; Breast cancer in her sister; Diabetes in her brother and mother; Heart disease in her brother and mother; Kidney disease in her sister; Parkinson's disease in her sister.  SOCIAL HISTORY:  reports that she quit smoking about 15 years ago. Her smoking use included cigarettes. She has a 40 pack-year smoking history. She has never used smokeless tobacco. She  reports that she does not drink alcohol and does not use drugs.  ALLERGIES: Bee venom, Hydrocodone-acetaminophen , Lactose, Lactose intolerance (gi), Percocet [oxycodone -acetaminophen ], Vicodin [hydrocodone-acetaminophen ], and  Penicillins  MEDICATIONS:  Current Outpatient Medications  Medication Sig Dispense Refill   albuterol  (PROVENTIL  HFA;VENTOLIN  HFA) 108 (90 Base) MCG/ACT inhaler Inhale 2-4 puffs by mouth every 4 hours as needed for wheezing, cough, and/or shortness of breath 1 Inhaler 1   alendronate (FOSAMAX) 70 MG tablet Take 70 mg by mouth once a week.     Ascorbic Acid  (VITAMIN C ) 1000 MG tablet Take 1,000 mg by mouth daily.     aspirin  EC 81 MG EC tablet Take 1 tablet (81 mg total) by mouth daily.     atorvastatin  (LIPITOR) 80 MG tablet Take 80 mg by mouth at bedtime.     azithromycin  (ZITHROMAX ) 250 MG tablet 2 tabs today, then 1 tab daily on days 2-5 6 tablet 0   Cholecalciferol  25 MCG (1000 UT) tablet Take 1,000 Units by mouth daily.     diltiazem  (CARDIZEM  CD) 300 MG 24 hr capsule Take 1 capsule (300 mg total) by mouth at bedtime. 30 capsule 1   DULoxetine  (CYMBALTA ) 60 MG capsule Take 60 mg by mouth every morning.     ezetimibe (ZETIA) 10 MG tablet Take 10 mg by mouth daily.     famotidine  (PEPCID ) 40 MG tablet Take 40 mg by mouth daily.     fluticasone  (FLONASE ) 50 MCG/ACT nasal spray Place 1 spray into both nostrils daily as needed for allergies.     fluticasone  furoate-vilanterol (BREO ELLIPTA ) 100-25 MCG/INH AEPB Inhale 1 puff into the lungs daily.     guaiFENesin  (MUCINEX ) 600 MG 12 hr tablet Take 1 tablet (600 mg total) by mouth 2 (two) times daily as needed.     hydrocortisone 2.5 % cream Apply 1 Application topically 3 (three) times daily.     ipratropium-albuterol  (DUONEB) 0.5-2.5 (3) MG/3ML SOLN Take 3 mLs by nebulization every 6 (six) hours as needed. 360 mL 1   lisinopril  (ZESTRIL ) 20 MG tablet Take 20 mg by mouth daily.     montelukast  (SINGULAIR ) 10 MG tablet Take 10 mg by mouth daily.     Multiple Vitamin (MULTI-VITAMIN DAILY PO) Take 1 tablet by mouth daily.     omeprazole (PRILOSEC) 20 MG capsule Take 20 mg by mouth daily.     oxybutynin  (DITROPAN  XL) 15 MG 24 hr tablet Take 15 mg  by mouth daily.     potassium chloride  SA (K-DUR,KLOR-CON ) 20 MEQ tablet Take 1 tablet (20 mEq total) by mouth daily. 15 tablet 0   traMADol  (ULTRAM ) 50 MG tablet Take 1 tablet (50 mg total) by mouth every 6 (six) hours as needed. 5 tablet 0   umeclidinium bromide  (INCRUSE ELLIPTA ) 62.5 MCG/INH AEPB Inhale 1 puff into the lungs daily.     vitamin B-12 (CYANOCOBALAMIN ) 1000 MCG tablet Take 1,000 mcg by mouth daily.      No current facility-administered medications for this encounter.    ECOG PERFORMANCE STATUS:  0 - Asymptomatic  REVIEW OF SYSTEMS: Patient denies any weight loss, fatigue, weakness, fever, chills or night sweats. Patient denies any loss of vision, blurred vision. Patient denies any ringing  of the ears or hearing loss. No irregular heartbeat. Patient denies heart murmur or history of fainting. Patient denies any chest pain or pain radiating to her upper extremities. Patient denies any shortness of breath, difficulty breathing at night, cough or hemoptysis. Patient denies any swelling in  the lower legs. Patient denies any nausea vomiting, vomiting of blood, or coffee ground material in the vomitus. Patient denies any stomach pain. Patient states has had normal bowel movements no significant constipation or diarrhea. Patient denies any dysuria, hematuria or significant nocturia. Patient denies any problems walking, swelling in the joints or loss of balance. Patient denies any skin changes, loss of hair or loss of weight. Patient denies any excessive worrying or anxiety or significant depression. Patient denies any problems with insomnia. Patient denies excessive thirst, polyuria, polydipsia. Patient denies any swollen glands, patient denies easy bruising or easy bleeding. Patient denies any recent infections, allergies or URI. Patient s visual fields have not changed significantly in recent time.   PHYSICAL EXAM: BP 121/74   Pulse 99   Temp (!) 97 F (36.1 C) (Tympanic)   Resp 20   Frail elderly female wheelchair-bound in NAD on nasal oxygen .  Well-developed well-nourished patient in NAD. HEENT reveals PERLA, EOMI, discs not visualized.  Oral cavity is clear. No oral mucosal lesions are identified. Neck is clear without evidence of cervical or supraclavicular adenopathy. Lungs are clear to A&P. Cardiac examination is essentially unremarkable with regular rate and rhythm without murmur rub or thrill. Abdomen is benign with no organomegaly or masses noted. Motor sensory and DTR levels are equal and symmetric in the upper and lower extremities. Cranial nerves II through XII are grossly intact. Proprioception is intact. No peripheral adenopathy or edema is identified. No motor or sensory levels are noted. Crude visual fields are within normal range.  LABORATORY DATA: Pathology reports reviewed    RADIOLOGY RESULTS: CT scans and PET CT scans reviewed compatible with above-stated findings   IMPRESSION: Stage I A3 squamous cell carcinoma of the left upper lobe in 73 year old female  PLAN: At this time based on the central location of the tumor I would treat this with ultra hypofractionated radiation therapy 60 Gray in 15 fractions using IMRT treatment planning and delivery.  I would choose IMRT to spare critical structures such as esophagus normal lung volume heart and spinal cord.  Risks and benefits of treatment occluding increased cough fatigue alteration of blood counts skin reaction all were reviewed with the patient.  She comprehends her recommendations well.  I personally set up and ordered CT simulation for early next week.  I would like to take this opportunity to thank you for allowing me to participate in the care of your patient.SABRA Marcey Penton, MD

## 2024-06-09 NOTE — Progress Notes (Signed)
Met with patient during initial consult with Dr. Baruch Gouty. All questions answered during visit. Reviewed upcoming appts. Contact info given and instructed to call with any questions or needs. Pt verbalized understanding.

## 2024-06-11 ENCOUNTER — Inpatient Hospital Stay (HOSPITAL_BASED_OUTPATIENT_CLINIC_OR_DEPARTMENT_OTHER): Admitting: Hospice and Palliative Medicine

## 2024-06-11 DIAGNOSIS — C3492 Malignant neoplasm of unspecified part of left bronchus or lung: Secondary | ICD-10-CM

## 2024-06-11 NOTE — Progress Notes (Signed)
 Multidisciplinary Oncology Council Documentation  Audrey Sexton was presented by our [facility] on 06/11/2024, which included representatives from:  Palliative Care Dietitian  Physical/Occupational Therapist Nurse Navigator Genetics Social work Survivorship RN Financial Navigator Research RN   Audrey Sexton currently presents with history of lung cancer  We reviewed previous medical and familial history, history of present illness, and recent lab results along with all available histopathologic and imaging studies. The MOC considered available treatment options and made the following recommendations/referrals:  SW  The MOC is a meeting of clinicians from various specialty areas who evaluate and discuss patients for whom a multidisciplinary approach is being considered. Final determinations in the plan of care are those of the provider(s).   Today's extended care, comprehensive team conference, Audrey Sexton was not present for the discussion and was not examined.

## 2024-06-12 ENCOUNTER — Inpatient Hospital Stay

## 2024-06-12 NOTE — Progress Notes (Signed)
 [facility] Clinical Social Work  Initial Assessment   Audrey Sexton is a 73 y.o. year old female contacted by phone. Clinical Social Work was referred by [facility] protocol for assessment of psychosocial needs.   SDOH (Social Determinants of Health) assessments performed: No SDOH Interventions    Flowsheet Row ED to Hosp-Admission (Discharged) from 05/04/2023 in [facility] 1C MEDICAL TELEMETRY  SDOH Interventions   Transportation Interventions Inpatient TOC    SDOH Screenings   Food Insecurity: No Food Insecurity (05/30/2024)  Housing: Low Risk  (05/30/2024)  Transportation Needs: Unmet Transportation Needs (05/30/2024)  Utilities: Not At Risk (05/30/2024)  Depression (PHQ2-9): Low Risk  (06/09/2024)  Financial Resource Strain: Medium Risk (11/23/2023)   Received from [facility]  Physical Activity: Unknown (09/09/2018)  Social Connections: Unknown (09/09/2018)  Tobacco Use: Medium Risk (06/09/2024)    PHQ 2/9:    06/09/2024   11:33 AM 05/30/2024   11:26 AM  Depression screen PHQ 2/9  Decreased Interest 0 0  Down, Depressed, Hopeless 0 0  PHQ - 2 Score 0 0     Distress Screen completed: No    05/30/2024   11:21 AM  ONCBCN DISTRESS SCREENING  Screening Type Initial Screening  How much distress have you been experiencing in the past week? (0-10) 0      Family/Social Information:  Housing Arrangement: patient lives alone and daughter lives an hour away. Neighbors and friends close by. Family members/support persons in your life? Family and Neighbors Transportation concerns: no, daughter is currently bringing pt to apts, if need arises pt will contact CSW.  Employment: Retired .  Income source: Secretary/administrator concerns: No Type of concern: None Food access concerns: no Religious or spiritual practice: [employer], does not currently attend church due to health concerns Advanced directives: No Services Currently  in place:  [employer], [employer]  Coping/ Adjustment to diagnosis: Patient understands treatment plan and what happens next? yes, CT scan on 10/20, Tx plan not finalized. Concerns about diagnosis and/or treatment: Afraid of cancer and What if the treatment doesn't work. Patient reported stressors: Anxiety/ nervousness and Adjusting to my illness Current coping skills/ strengths: Ability for insight , Capable of independent living , Motivation for treatment/growth , Religious Affiliation , and Supportive family/friends     SUMMARY: Current SDOH Barriers:  None identified at this time.  Clinical Social Work Clinical Goal(s):  No clinical social work goals at this time  Interventions: Discussed common feeling and emotions when being diagnosed with cancer, and the importance of support during treatment Informed patient of the support team roles and support services at [facility] Provided CSW contact information and encouraged patient to call with any questions or concerns    Follow Up Plan: CSW will follow-up with patient by phone  Patient verbalizes understanding of plan: Yes    Thersia KATHEE Daring Clinical Social Work Intern [employer]

## 2024-06-16 ENCOUNTER — Encounter: Payer: Self-pay | Admitting: *Deleted

## 2024-06-16 ENCOUNTER — Ambulatory Visit
Admission: RE | Admit: 2024-06-16 | Discharge: 2024-06-16 | Disposition: A | Discharge status: Home / Self Care | Source: Ambulatory Visit | Attending: Radiation Oncology | Admitting: Radiation Oncology

## 2024-06-16 DIAGNOSIS — Z510 Encounter for antineoplastic radiation therapy: Secondary | ICD-10-CM | POA: Diagnosis not present

## 2024-06-17 ENCOUNTER — Other Ambulatory Visit: Payer: Self-pay | Admitting: *Deleted

## 2024-06-17 DIAGNOSIS — C3490 Malignant neoplasm of unspecified part of unspecified bronchus or lung: Secondary | ICD-10-CM

## 2024-06-19 ENCOUNTER — Ambulatory Visit: Admitting: Student in an Organized Health Care Education/Training Program

## 2024-06-19 ENCOUNTER — Encounter: Payer: Self-pay | Admitting: Student in an Organized Health Care Education/Training Program

## 2024-06-19 ENCOUNTER — Telehealth: Payer: Self-pay

## 2024-06-19 VITALS — BP 128/80 | HR 84 | Temp 97.5°F | Ht 64.0 in | Wt 180.0 lb

## 2024-06-19 DIAGNOSIS — Z23 Encounter for immunization: Secondary | ICD-10-CM | POA: Diagnosis not present

## 2024-06-19 DIAGNOSIS — C3492 Malignant neoplasm of unspecified part of left bronchus or lung: Secondary | ICD-10-CM

## 2024-06-19 DIAGNOSIS — J42 Unspecified chronic bronchitis: Secondary | ICD-10-CM

## 2024-06-19 DIAGNOSIS — J9611 Chronic respiratory failure with hypoxia: Secondary | ICD-10-CM | POA: Diagnosis not present

## 2024-06-19 DIAGNOSIS — C3490 Malignant neoplasm of unspecified part of unspecified bronchus or lung: Secondary | ICD-10-CM

## 2024-06-19 DIAGNOSIS — J4489 Other specified chronic obstructive pulmonary disease: Secondary | ICD-10-CM | POA: Diagnosis not present

## 2024-06-19 MED ORDER — ENSIFENTRINE 3 MG/2.5ML IN SUSP: 3.0000 mg | Freq: Two times a day (BID) | RESPIRATORY_TRACT | Status: DC

## 2024-06-19 NOTE — Telephone Encounter (Signed)
 Form has been signed and faxed back. Clint Rockford is aware.  Nothing further needed.

## 2024-06-19 NOTE — Progress Notes (Signed)
 Assessment & Plan:   #Chronic bronchitis #COPD GOLD 3B #Asthma/COPD Overlap #Chronic Hypoxic Respiratory Failure  COPD presents with increased cough and yellow sputum production, but no worsening of wheezing or shortness of breath. Incruse and Breo are providing symptomatic relief, and she is also on montelukast . Historic CBC data show an eosinophil count of 1100 in 2015 which suggests possible Asthma/COPD overlap.   For management, will start ensifentrine  (Ohtuvayre ) as add on therapy. Will continue Incruse and Breo as prescribed. Will consider increasing ICS dose of Breo on follow up.  - Ensifentrine  3 MG/2.5ML SUSP; Inhale 3 mg into the lungs in the morning and at bedtime. - Flu Vaccine today - RSV vaccine recommended - she will check with PCP regarding pneumococcal vaccination. 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 Seen by oncology and thoracic surgery. Not a surgical candidate, and referred to Rad/Onc for SBRT. She is undergoing radiation therapy for Stage 1 lung cancer, as surgery is not an option due to compromised pulmonary function. Radiation therapy will be completed in three weeks. Continue radiation therapy as scheduled.   Return in about 6 months (around 12/18/2024).  Belva November, MD [facility]  I spent 30 minutes caring for this patient today, including preparing to see the patient, obtaining a medical history , reviewing a separately obtained history, performing a medically appropriate examination and/or evaluation, counseling and educating the patient/family/caregiver, ordering medications, tests, or procedures, documenting clinical information in the electronic health record, and independently interpreting results (not separately reported/billed) and communicating results to the patient/family/caregiver  End of visit  medications:  Meds ordered this encounter  Medications   Ensifentrine  3 MG/2.5ML SUSP    Sig: Inhale 3 mg into the lungs in the morning and at bedtime.     Current Outpatient Medications:    albuterol  (PROVENTIL  HFA;VENTOLIN  HFA) 108 (90 Base) MCG/ACT inhaler, Inhale 2-4 puffs by mouth every 4 hours as needed for wheezing, cough, and/or shortness of breath, Disp: 1 Inhaler, Rfl: 1   alendronate (FOSAMAX) 70 MG tablet, Take 70 mg by mouth once a week., Disp: , Rfl:    Ascorbic Acid  (VITAMIN C ) 1000 MG tablet, Take 1,000 mg by mouth daily., Disp: , Rfl:    aspirin  EC 81 MG EC tablet, Take 1 tablet (81 mg total) by mouth daily., Disp: , Rfl:    atorvastatin  (LIPITOR) 80 MG tablet, Take 80 mg by mouth at bedtime., Disp: , Rfl:    Cholecalciferol  25 MCG (1000 UT) tablet, Take 1,000 Units by mouth daily., Disp: , Rfl:    diltiazem  (CARDIZEM  CD) 300 MG 24 hr capsule, Take 1 capsule (300 mg total) by mouth at bedtime., Disp: 30 capsule, Rfl: 1   DULoxetine  (CYMBALTA ) 60 MG capsule, Take 60 mg by mouth every morning., Disp: , Rfl:    Ensifentrine  3 MG/2.5ML SUSP, Inhale 3 mg into the lungs in the morning and at bedtime., Disp: , Rfl:    ezetimibe (ZETIA) 10 MG tablet, Take 10 mg by mouth daily., Disp: , Rfl:    famotidine  (PEPCID ) 40 MG tablet, Take 40 mg by mouth daily., Disp: , Rfl:    fluticasone  (FLONASE ) 50 MCG/ACT nasal spray, Place 1 spray into both nostrils daily as needed for allergies., Disp: , Rfl:    fluticasone  furoate-vilanterol (BREO ELLIPTA ) 100-25 MCG/INH AEPB, Inhale 1 puff into the lungs daily., Disp: ,  Rfl:    guaiFENesin  (MUCINEX ) 600 MG 12 hr tablet, Take 1 tablet (600 mg total) by mouth 2 (two) times daily as needed., Disp: , Rfl:    hydrocortisone 2.5 % cream, Apply 1 Application topically 3 (three) times daily., Disp: , Rfl:    ipratropium-albuterol  (DUONEB) 0.5-2.5 (3) MG/3ML SOLN, Take 3 mLs by nebulization every 6 (six) hours as needed., Disp: 360 mL, Rfl: 1   lisinopril   (ZESTRIL ) 30 MG tablet, Take 30 mg by mouth daily., Disp: , Rfl:    montelukast  (SINGULAIR ) 10 MG tablet, Take 10 mg by mouth daily., Disp: , Rfl:    Multiple Vitamin (MULTI-VITAMIN DAILY PO), Take 1 tablet by mouth daily., Disp: , Rfl:    omeprazole (PRILOSEC) 20 MG capsule, Take 20 mg by mouth daily., Disp: , Rfl:    oxybutynin  (DITROPAN  XL) 15 MG 24 hr tablet, Take 15 mg by mouth daily., Disp: , Rfl:    potassium chloride  SA (K-DUR,KLOR-CON ) 20 MEQ tablet, Take 1 tablet (20 mEq total) by mouth daily., Disp: 15 tablet, Rfl: 0   traMADol  (ULTRAM ) 50 MG tablet, Take 1 tablet (50 mg total) by mouth every 6 (six) hours as needed., Disp: 5 tablet, Rfl: 0   umeclidinium bromide  (INCRUSE ELLIPTA ) 62.5 MCG/INH AEPB, Inhale 1 puff into the lungs daily., Disp: , Rfl:    vitamin B-12 (CYANOCOBALAMIN ) 1000 MCG tablet, Take 1,000 mcg by mouth daily. , Disp: , Rfl:    azithromycin  (ZITHROMAX ) 250 MG tablet, 2 tabs today, then 1 tab daily on days 2-5 (Patient not taking: Reported on 06/19/2024), Disp: 6 tablet, Rfl: 0   Subjective:   PATIENT ID: Audrey Sexton GENDER: female DOB: 1950-08-30, MRN: [id number]  Chief Complaint  Patient presents with   Shortness of Breath    No SOB or wheezing. Cough. Albuterol  2-3 times every week. Breo daily helps with her breathing. Incruse daily helps with her breathing. Duoneb as needed.    HPI  Audrey Sexton is a 73 year old female with early stage lung cancer and advanced COPD who presents for follow-up regarding her treatment plan.  Initial Visit 04/30/2024:   Patient has a longstanding history of airway disease thought to be COPD secondary to tobacco abuse.  She is followed closely by her primary care provider and is maintained on Breo Ellipta  and Incruse Ellipta  for triple therapy.  Her last exacerbation was in September 2024.  As part of her general preventative care, she underwent a low-dose CT for lung cancer screening in January 2025 which was notable for a  left upper lobe pulmonary nodule. This was further evaluated with a PET/CT showing the nodule to be FDG avid with an SUV of 9.7.  Patient was referred to pulmonary but her evaluation was delayed after she suffered multiple falls.  She was finally able to schedule an appointment and presents to day to establish care.   Today, patient presents to clinic on oxygen  therapy.  She is visibly short of breath with exertion.  She does feel a little more short of breath than her baseline.  She does report a chronic cough and chronic wheezing.  She does not have any change in the amount or nature of her sputum production.  She has not had any fevers or chills recently nor does she report any signs of an upper respiratory tract infection.  Return Visit 06/19/2024:  Discussed the use of AI scribe software for clinical note transcription with the patient, who gave verbal consent to  proceed.  She is scheduled to undergo radiation therapy for early stage lung cancer, with treatments planned for three weeks. She is experiencing fatigue and is not accustomed to going out every day, as she typically prefers to go out once or twice a month.  She has advanced COPD and reports an increase in coughing with yellow phlegm production, which is unusual for her as she typically experiences a dry cough. There is no worsening of wheezing, and her breathing has been stable since the last visit. She is currently using Incruse and Breo, one puff of each once a day in the morning, and she rinses her mouth after using Breo. She is also on montelukast  10 mg for allergies. She is uncertain about her pneumonia vaccination status and plans to check with her primary care provider. She has not had her Flu shot this year    Patient reports a longstanding history of smoking, having quit in 2010.  She has at least 40 pack years of smoking history.  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  Ancillary information including prior  medications, full medical/surgical/family/social histories, and PFTs (when available) are listed below and have been reviewed.    Review of Systems  Constitutional:  Positive for malaise/fatigue. Negative for chills, fever and weight loss.  Respiratory:  Positive for cough, sputum production, shortness of breath and wheezing. Negative for hemoptysis.   Cardiovascular:  Negative for chest pain.     Objective:   Vitals:   06/19/24 0934  BP: 128/80  Pulse: 84  Temp: (!) 97.5 F (36.4 C)  SpO2: 94%  Weight: 180 lb (81.6 kg)  Height: 5' 4 (1.626 m)   94% on RA  BMI Readings from Last 3 Encounters:  06/19/24 30.90 kg/m  06/06/24 32.27 kg/m  05/30/24 32.27 kg/m   Wt Readings from Last 3 Encounters:  06/19/24 180 lb (81.6 kg)  06/06/24 188 lb (85.3 kg)  05/30/24 188 lb (85.3 kg)    Physical Exam Constitutional:      Appearance: Normal appearance. She is ill-appearing.  Cardiovascular:     Rate and Rhythm: Normal rate and regular rhythm.     Pulses: Normal pulses.     Heart sounds: Normal heart sounds.  Pulmonary:     Effort: Pulmonary effort is normal.     Breath sounds: Wheezing present. No rhonchi or rales.  Neurological:     General: No focal deficit present.     Mental Status: She is alert and oriented to person, place, and time. Mental status is at baseline.       Ancillary Information    Past Medical History:  Diagnosis Date   Anemia    Anxiety    Arthritis    Asthma    Carpal tunnel syndrome of left wrist    COPD (chronic obstructive pulmonary disease) (HCC)    Dyspnea    History of kidney stones    Hyperlipemia    Hypertension    Pneumonia    Stroke [facility])    Jan. 2020     Family History  Problem Relation Age of Onset   Heart disease Mother        died at 78   Diabetes Mother    Alzheimer's disease Father        died at 8   Parkinson's disease Sister    Heart disease Brother        died at 66   Diabetes Brother    Kidney disease  Sister  Breast cancer Sister      Past Surgical History:  Procedure Laterality Date   CARPAL TUNNEL RELEASE Left    COLONOSCOPY N/A 09/07/2020   Procedure: COLONOSCOPY;  Surgeon: Maryruth Ole DASEN, MD;  Location: [facility] ENDOSCOPY;  Service: Endoscopy;  Laterality: N/A;   COLONOSCOPY WITH PROPOFOL  N/A 09/16/2022   Procedure: COLONOSCOPY WITH PROPOFOL ;  Surgeon: Therisa Bi, MD;  Location: [facility] ENDOSCOPY;  Service: Endoscopy;  Laterality: N/A;   ESOPHAGOGASTRODUODENOSCOPY (EGD) WITH PROPOFOL  N/A 09/15/2022   Procedure: ESOPHAGOGASTRODUODENOSCOPY (EGD) WITH PROPOFOL ;  Surgeon: Onita Elspeth Sharper, DO;  Location: [facility] ENDOSCOPY;  Service: Gastroenterology;  Laterality: N/A;   fractured tibia Left    repair   ROTATOR CUFF REPAIR Left    TEE WITHOUT CARDIOVERSION N/A 09/09/2018   Procedure: TRANSESOPHAGEAL ECHOCARDIOGRAM (TEE);  Surgeon: Bosie Vinie LABOR, MD;  Location: [facility] ORS;  Service: Cardiovascular;  Laterality: N/A;   TUBAL LIGATION     VIDEO BRONCHOSCOPY WITH ENDOBRONCHIAL NAVIGATION Bilateral 05/20/2024   Procedure: VIDEO BRONCHOSCOPY WITH ENDOBRONCHIAL NAVIGATION;  Surgeon: Isadora Hose, MD;  Location: [facility] ORS;  Service: Pulmonary;  Laterality: Bilateral;    Social History   Socioeconomic History   Marital status: Widowed    Spouse name: Not on file   Number of children: Not on file   Years of education: Not on file   Highest education level: Not on file  Occupational History   Occupation: retired     Comment: Conservation officer, nature    Comment: school bus driver  Tobacco Use   Smoking status: Former    Current packs/day: 0.00    Average packs/day: 1 pack/day for 40.0 years (40.0 ttl pk-yrs)    Types: Cigarettes    Quit date: 2010    Years since quitting: 15.8   Smokeless tobacco: Never  Vaping Use   Vaping status: Never Used  Substance and Sexual Activity   Alcohol use: No   Drug use: No   Sexual activity: Not Currently    Birth control/protection: None  Other Topics Concern    Not on file  Social History Narrative   Lives alone   Social Drivers of Health   Financial Resource Strain: Medium Risk (11/23/2023)   Received from [facility]   Overall Financial Resource Strain (CARDIA)    Difficulty of Paying Living Expenses: Somewhat hard  Food Insecurity: No Food Insecurity (05/30/2024)   Hunger Vital Sign    Worried About Running Out of Food in the Last Year: Never true    Ran Out of Food in the Last Year: Never true  Transportation Needs: Unmet Transportation Needs (05/30/2024)   PRAPARE - Administrator, Civil Service (Medical): Yes    Lack of Transportation (Non-Medical): No  Physical Activity: Unknown (09/09/2018)   Exercise Vital Sign    Days of Exercise per Week: 0 days    Minutes of Exercise per Session: Not on file  Stress: Not on file  Social Connections: Unknown (09/09/2018)   Social Connection and Isolation Panel    Frequency of Communication with Friends and Family: More than three times a week    Frequency of Social Gatherings with Friends and Family: Once a week    Attends Religious Services: Never    Database administrator or Organizations: Not on file    Attends Banker Meetings: Not on file    Marital Status: Widowed  Intimate Partner Violence: Not At Risk (05/30/2024)   Humiliation, Afraid, Rape, and Kick questionnaire    Fear  of Current or Ex-Partner: No    Emotionally Abused: No    Physically Abused: No    Sexually Abused: No     Allergies  Allergen Reactions   Bee Venom Shortness Of Breath and Swelling   Hydrocodone-Acetaminophen  Nausea And Vomiting   Lactose Diarrhea   Lactose Intolerance (Gi) Diarrhea   Percocet [Oxycodone -Acetaminophen ] Nausea And Vomiting   Vicodin [Hydrocodone-Acetaminophen ] Nausea And Vomiting   Penicillins Hives    Has patient had a PCN reaction causing immediate rash, facial/tongue/throat swelling, SOB or lightheadedness with hypotension: no  Has patient had a  PCN reaction causing severe rash involving mucus membranes or skin necrosis: no  Has patient had a PCN reaction that required hospitalization  no  Has patient had a PCN reaction occurring within the last 10 years: no  If all of the above answers are NO, then may proceed with Cephalosporin use.  Has patient had a PCN reaction causing immediate rash, facial/tongue/throat swelling, SOB or lightheadedness with hypotension: no Has patient had a PCN reaction causing severe rash involving mucus membranes or skin necrosis: no Has patient had a PCN reaction that required hospitalization  no Has patient had a PCN reaction occurring within the last 10 years: no If all of the above answers are NO, then may proceed with Cephalosporin use.     CBC    Component Value Date/Time   WBC 11.8 (H) 05/20/2024 1402   RBC 4.44 05/20/2024 1402   HGB 12.8 05/20/2024 1402   HGB 9.8 (L) 06/24/2014 0357   HCT 39.4 05/20/2024 1402   HCT 30.3 (L) 06/24/2014 0357   PLT 271 05/20/2024 1402   PLT 319 06/24/2014 0357   MCV 88.7 05/20/2024 1402   MCV 85 06/24/2014 0357   MCH 28.8 05/20/2024 1402   MCHC 32.5 05/20/2024 1402   RDW 15.6 (H) 05/20/2024 1402   RDW 17.7 (H) 06/24/2014 0357   LYMPHSABS 1.9 09/14/2022 2023   LYMPHSABS 2.3 06/24/2014 0357   MONOABS 1.9 (H) 09/14/2022 2023   MONOABS 1.5 (H) 06/24/2014 0357   EOSABS 0.1 09/14/2022 2023   EOSABS 0.5 06/24/2014 0357   BASOSABS 0.1 09/14/2022 2023   BASOSABS 0.1 06/24/2014 0357    Pulmonary Functions Testing Results:    Latest Ref Rng & Units 05/07/2024    1:43 PM  PFT Results  FVC-Pre L 2.08   FVC-Predicted Pre % 72   FVC-Post L 2.00   FVC-Predicted Post % 70   Pre FEV1/FVC % % 49   Post FEV1/FCV % % 53   FEV1-Pre L 1.02   FEV1-Predicted Pre % 47   FEV1-Post L 1.06   DLCO uncorrected ml/min/mmHg 8.77   DLCO UNC% % 46   DLVA Predicted % 62   TLC L 5.29   TLC % Predicted % 104   RV % Predicted % 135     Outpatient Medications Prior to  Visit  Medication Sig Dispense Refill   albuterol  (PROVENTIL  HFA;VENTOLIN  HFA) 108 (90 Base) MCG/ACT inhaler Inhale 2-4 puffs by mouth every 4 hours as needed for wheezing, cough, and/or shortness of breath 1 Inhaler 1   alendronate (FOSAMAX) 70 MG tablet Take 70 mg by mouth once a week.     Ascorbic Acid  (VITAMIN C ) 1000 MG tablet Take 1,000 mg by mouth daily.     aspirin  EC 81 MG EC tablet Take 1 tablet (81 mg total) by mouth daily.     atorvastatin  (LIPITOR) 80 MG tablet Take 80 mg by mouth at bedtime.  Cholecalciferol  25 MCG (1000 UT) tablet Take 1,000 Units by mouth daily.     diltiazem  (CARDIZEM  CD) 300 MG 24 hr capsule Take 1 capsule (300 mg total) by mouth at bedtime. 30 capsule 1   DULoxetine  (CYMBALTA ) 60 MG capsule Take 60 mg by mouth every morning.     ezetimibe (ZETIA) 10 MG tablet Take 10 mg by mouth daily.     famotidine  (PEPCID ) 40 MG tablet Take 40 mg by mouth daily.     fluticasone  (FLONASE ) 50 MCG/ACT nasal spray Place 1 spray into both nostrils daily as needed for allergies.     fluticasone  furoate-vilanterol (BREO ELLIPTA ) 100-25 MCG/INH AEPB Inhale 1 puff into the lungs daily.     guaiFENesin  (MUCINEX ) 600 MG 12 hr tablet Take 1 tablet (600 mg total) by mouth 2 (two) times daily as needed.     hydrocortisone 2.5 % cream Apply 1 Application topically 3 (three) times daily.     ipratropium-albuterol  (DUONEB) 0.5-2.5 (3) MG/3ML SOLN Take 3 mLs by nebulization every 6 (six) hours as needed. 360 mL 1   lisinopril  (ZESTRIL ) 30 MG tablet Take 30 mg by mouth daily.     montelukast  (SINGULAIR ) 10 MG tablet Take 10 mg by mouth daily.     Multiple Vitamin (MULTI-VITAMIN DAILY PO) Take 1 tablet by mouth daily.     omeprazole (PRILOSEC) 20 MG capsule Take 20 mg by mouth daily.     oxybutynin  (DITROPAN  XL) 15 MG 24 hr tablet Take 15 mg by mouth daily.     potassium chloride  SA (K-DUR,KLOR-CON ) 20 MEQ tablet Take 1 tablet (20 mEq total) by mouth daily. 15 tablet 0   traMADol  (ULTRAM )  50 MG tablet Take 1 tablet (50 mg total) by mouth every 6 (six) hours as needed. 5 tablet 0   umeclidinium bromide  (INCRUSE ELLIPTA ) 62.5 MCG/INH AEPB Inhale 1 puff into the lungs daily.     vitamin B-12 (CYANOCOBALAMIN ) 1000 MCG tablet Take 1,000 mcg by mouth daily.      lisinopril  (ZESTRIL ) 20 MG tablet Take 20 mg by mouth daily.     azithromycin  (ZITHROMAX ) 250 MG tablet 2 tabs today, then 1 tab daily on days 2-5 (Patient not taking: Reported on 06/19/2024) 6 tablet 0   No facility-administered medications prior to visit.

## 2024-06-19 NOTE — Patient Instructions (Signed)
  VISIT SUMMARY: Today, we discussed your ongoing treatment for early stage lung cancer and advanced COPD. We reviewed your current symptoms, medications, and vaccination status. We also made plans to address your increased coughing and ensure you are up-to-date with your vaccinations.  YOUR PLAN: -STAGE 1 LUNG CANCER: You are undergoing radiation therapy for early stage lung cancer, which means the cancer is in its initial stages and has not spread extensively. Continue with your radiation therapy as scheduled for the next three weeks.  -CHRONIC OBSTRUCTIVE PULMONARY DISEASE (COPD): COPD is a chronic lung condition that makes it hard to breathe. You have noticed an increase in coughing with yellow phlegm. We will continue your current medications, Incruse and Breo, and add a nebulizer treatment called Encephentron (Ohtuvayre ) if your insurance covers it. Please check with your primary care provider about your pneumonia vaccination status.  -INFLUENZA VACCINATION: You are due for your flu shot this year since your last one was in February of the previous year. We will administer the influenza vaccination today.  INSTRUCTIONS: Please continue with your radiation therapy as scheduled for the next three weeks. Verify with your insurance if Encephentron (Ohtuvayre ) is covered and start using it via nebulizer twice daily if approved. Check with your primary care provider about your pneumonia vaccination status. You will receive your flu shot today.

## 2024-06-19 NOTE — Telephone Encounter (Signed)
 Patient was seen in the office today. Dr. Isadora has ordered Ohtuvayre  for the patient. He has signed the form and it has been faxed to the pharmacy team for completion.

## 2024-06-19 NOTE — Addendum Note (Signed)
 Addended by: VICCI EVALENE DEL on: 06/19/2024 10:23 AM   Modules accepted: Orders

## 2024-06-23 ENCOUNTER — Telehealth: Payer: Self-pay

## 2024-06-23 DIAGNOSIS — J42 Unspecified chronic bronchitis: Secondary | ICD-10-CM

## 2024-06-23 DIAGNOSIS — Z510 Encounter for antineoplastic radiation therapy: Secondary | ICD-10-CM | POA: Diagnosis not present

## 2024-06-23 NOTE — Telephone Encounter (Signed)
 See telephone encounter from 10/27. Pharmacy team has received the form.  Nothing further needed.

## 2024-06-23 NOTE — Telephone Encounter (Signed)
 Received Ohtuvayre  new start paperwork. Completed form and faxed with clinicals and insurance card copy to [facility]   Phone#: [phone] Fax#: [phone]

## 2024-06-23 NOTE — Telephone Encounter (Signed)
 Received fax from [employer] confirming receipt of enrollment form.   Patient ID: [id number]

## 2024-06-24 ENCOUNTER — Ambulatory Visit
Admission: RE | Admit: 2024-06-24 | Discharge: 2024-06-24 | Disposition: A | Source: Ambulatory Visit | Attending: Radiation Oncology | Admitting: Radiation Oncology

## 2024-06-25 ENCOUNTER — Other Ambulatory Visit: Payer: Self-pay

## 2024-06-25 ENCOUNTER — Encounter: Payer: Self-pay | Admitting: *Deleted

## 2024-06-25 ENCOUNTER — Ambulatory Visit
Admission: RE | Admit: 2024-06-25 | Discharge: 2024-06-25 | Disposition: A | Source: Ambulatory Visit | Attending: Radiation Oncology | Admitting: Radiation Oncology

## 2024-06-25 DIAGNOSIS — Z510 Encounter for antineoplastic radiation therapy: Secondary | ICD-10-CM | POA: Diagnosis not present

## 2024-06-25 LAB — RAD ONC ARIA SESSION SUMMARY
Course Elapsed Days: 0
Plan Fractions Treated to Date: 1
Plan Prescribed Dose Per Fraction: 4 Gy
Plan Total Fractions Prescribed: 15
Plan Total Prescribed Dose: 60 Gy
Reference Point Dosage Given to Date: 4 Gy
Reference Point Session Dosage Given: 4 Gy
Session Number: 1

## 2024-06-25 NOTE — Telephone Encounter (Signed)
 Received fax from [employer] with summary of benefits. Referral form for Ohtuvayre  received. Rx will be triaged to [facility].. Once benefits investigation completed, pharmacy will reach out the patient to schedule shipment. If medication is unaffordable, patient will need to express financial hardship to be referred back to [facility] for patient assistance program pre-screening.   Patient ID: [id number] Pharmacy phone: [phone] [facility] Phone#: [phone]

## 2024-06-26 ENCOUNTER — Other Ambulatory Visit: Payer: Self-pay

## 2024-06-26 ENCOUNTER — Telehealth: Payer: Self-pay

## 2024-06-26 ENCOUNTER — Ambulatory Visit
Admission: RE | Admit: 2024-06-26 | Discharge: 2024-06-26 | Disposition: A | Source: Ambulatory Visit | Attending: Radiation Oncology | Admitting: Radiation Oncology

## 2024-06-26 DIAGNOSIS — Z510 Encounter for antineoplastic radiation therapy: Secondary | ICD-10-CM | POA: Diagnosis not present

## 2024-06-26 LAB — RAD ONC ARIA SESSION SUMMARY
Course Elapsed Days: 1
Plan Fractions Treated to Date: 2
Plan Prescribed Dose Per Fraction: 4 Gy
Plan Total Fractions Prescribed: 15
Plan Total Prescribed Dose: 60 Gy
Reference Point Dosage Given to Date: 8 Gy
Reference Point Session Dosage Given: 4 Gy
Session Number: 2

## 2024-06-26 NOTE — Telephone Encounter (Signed)
 Received fax from [facility] that prescription was received. They will be contacting the patient shortly.   Pharmacy phone: [phone]

## 2024-06-26 NOTE — Telephone Encounter (Signed)
 Clinical Social Work Intern attempted to contact patient by phone to follow up on emotional support, left VM with direct contact information for patient to call back.  Thersia Daring Clinical Social Work Intern [facility]

## 2024-06-27 ENCOUNTER — Ambulatory Visit
Admission: RE | Admit: 2024-06-27 | Discharge: 2024-06-27 | Disposition: A | Discharge status: Home / Self Care | Source: Ambulatory Visit | Attending: Radiation Oncology | Admitting: Radiation Oncology

## 2024-06-27 ENCOUNTER — Other Ambulatory Visit: Payer: Self-pay

## 2024-06-27 DIAGNOSIS — Z510 Encounter for antineoplastic radiation therapy: Secondary | ICD-10-CM | POA: Diagnosis not present

## 2024-06-27 LAB — RAD ONC ARIA SESSION SUMMARY
Course Elapsed Days: 2
Plan Fractions Treated to Date: 3
Plan Prescribed Dose Per Fraction: 4 Gy
Plan Total Fractions Prescribed: 15
Plan Total Prescribed Dose: 60 Gy
Reference Point Dosage Given to Date: 12 Gy
Reference Point Session Dosage Given: 4 Gy
Session Number: 3

## 2024-06-30 ENCOUNTER — Other Ambulatory Visit: Payer: Self-pay

## 2024-06-30 ENCOUNTER — Ambulatory Visit
Admission: RE | Admit: 2024-06-30 | Discharge: 2024-06-30 | Disposition: A | Discharge status: Home / Self Care | Source: Ambulatory Visit | Attending: Radiation Oncology | Admitting: Radiation Oncology

## 2024-06-30 ENCOUNTER — Inpatient Hospital Stay: Attending: Oncology

## 2024-06-30 DIAGNOSIS — C3490 Malignant neoplasm of unspecified part of unspecified bronchus or lung: Secondary | ICD-10-CM

## 2024-06-30 DIAGNOSIS — C3412 Malignant neoplasm of upper lobe, left bronchus or lung: Secondary | ICD-10-CM | POA: Insufficient documentation

## 2024-06-30 DIAGNOSIS — Z510 Encounter for antineoplastic radiation therapy: Secondary | ICD-10-CM | POA: Insufficient documentation

## 2024-06-30 LAB — CBC (CANCER CENTER ONLY)
HCT: 39.5 % (ref 36.0–46.0)
Hemoglobin: 12.6 g/dL (ref 12.0–15.0)
MCH: 29 pg (ref 26.0–34.0)
MCHC: 31.9 g/dL (ref 30.0–36.0)
MCV: 91 fL (ref 80.0–100.0)
Platelet Count: 249 K/uL (ref 150–400)
RBC: 4.34 MIL/uL (ref 3.87–5.11)
RDW: 14.7 % (ref 11.5–15.5)
WBC Count: 9.8 K/uL (ref 4.0–10.5)
nRBC: 0 % (ref 0.0–0.2)

## 2024-06-30 LAB — RAD ONC ARIA SESSION SUMMARY
Course Elapsed Days: 5
Plan Fractions Treated to Date: 4
Plan Prescribed Dose Per Fraction: 4 Gy
Plan Total Fractions Prescribed: 15
Plan Total Prescribed Dose: 60 Gy
Reference Point Dosage Given to Date: 16 Gy
Reference Point Session Dosage Given: 4 Gy
Session Number: 4

## 2024-07-01 ENCOUNTER — Ambulatory Visit
Admission: RE | Admit: 2024-07-01 | Discharge: 2024-07-01 | Disposition: A | Discharge status: Home / Self Care | Source: Ambulatory Visit | Attending: Radiation Oncology | Admitting: Radiation Oncology

## 2024-07-01 ENCOUNTER — Other Ambulatory Visit: Payer: Self-pay

## 2024-07-01 LAB — RAD ONC ARIA SESSION SUMMARY
Course Elapsed Days: 6
Plan Fractions Treated to Date: 5
Plan Prescribed Dose Per Fraction: 4 Gy
Plan Total Fractions Prescribed: 15
Plan Total Prescribed Dose: 60 Gy
Reference Point Dosage Given to Date: 20 Gy
Reference Point Session Dosage Given: 4 Gy
Session Number: 5

## 2024-07-01 NOTE — Telephone Encounter (Signed)
 Received fax from [facility] - after several attempts, unable to reach patient.   Will send MyChart message to patient.

## 2024-07-02 ENCOUNTER — Ambulatory Visit
Admission: RE | Admit: 2024-07-02 | Discharge: 2024-07-02 | Disposition: A | Discharge status: Home / Self Care | Source: Ambulatory Visit | Attending: Radiation Oncology | Admitting: Radiation Oncology

## 2024-07-02 ENCOUNTER — Other Ambulatory Visit: Payer: Self-pay

## 2024-07-02 LAB — RAD ONC ARIA SESSION SUMMARY
Course Elapsed Days: 7
Plan Fractions Treated to Date: 6
Plan Prescribed Dose Per Fraction: 4 Gy
Plan Total Fractions Prescribed: 15
Plan Total Prescribed Dose: 60 Gy
Reference Point Dosage Given to Date: 24 Gy
Reference Point Session Dosage Given: 4 Gy
Session Number: 6

## 2024-07-03 ENCOUNTER — Other Ambulatory Visit: Payer: Self-pay

## 2024-07-03 ENCOUNTER — Ambulatory Visit
Admission: RE | Admit: 2024-07-03 | Discharge: 2024-07-03 | Disposition: A | Source: Ambulatory Visit | Attending: Radiation Oncology | Admitting: Radiation Oncology

## 2024-07-03 LAB — RAD ONC ARIA SESSION SUMMARY
Course Elapsed Days: 8
Plan Fractions Treated to Date: 7
Plan Prescribed Dose Per Fraction: 4 Gy
Plan Total Fractions Prescribed: 15
Plan Total Prescribed Dose: 60 Gy
Reference Point Dosage Given to Date: 28 Gy
Reference Point Session Dosage Given: 4 Gy
Session Number: 7

## 2024-07-04 ENCOUNTER — Ambulatory Visit

## 2024-07-04 LAB — ACID FAST CULTURE WITH REFLEXED SENSITIVITIES (MYCOBACTERIA): Acid Fast Culture: NEGATIVE

## 2024-07-07 ENCOUNTER — Other Ambulatory Visit: Payer: Self-pay

## 2024-07-07 ENCOUNTER — Inpatient Hospital Stay

## 2024-07-07 ENCOUNTER — Ambulatory Visit
Admission: RE | Admit: 2024-07-07 | Discharge: 2024-07-07 | Disposition: A | Discharge status: Home / Self Care | Source: Ambulatory Visit | Attending: Radiation Oncology | Admitting: Radiation Oncology

## 2024-07-07 DIAGNOSIS — C3490 Malignant neoplasm of unspecified part of unspecified bronchus or lung: Secondary | ICD-10-CM

## 2024-07-07 LAB — CBC (CANCER CENTER ONLY)
HCT: 39.4 % (ref 36.0–46.0)
Hemoglobin: 12.7 g/dL (ref 12.0–15.0)
MCH: 29.5 pg (ref 26.0–34.0)
MCHC: 32.2 g/dL (ref 30.0–36.0)
MCV: 91.4 fL (ref 80.0–100.0)
Platelet Count: 232 K/uL (ref 150–400)
RBC: 4.31 MIL/uL (ref 3.87–5.11)
RDW: 14.4 % (ref 11.5–15.5)
WBC Count: 9.1 K/uL (ref 4.0–10.5)
nRBC: 0 % (ref 0.0–0.2)

## 2024-07-07 LAB — RAD ONC ARIA SESSION SUMMARY
Course Elapsed Days: 12
Plan Fractions Treated to Date: 8
Plan Prescribed Dose Per Fraction: 4 Gy
Plan Total Fractions Prescribed: 15
Plan Total Prescribed Dose: 60 Gy
Reference Point Dosage Given to Date: 32 Gy
Reference Point Session Dosage Given: 4 Gy
Session Number: 8

## 2024-07-08 ENCOUNTER — Ambulatory Visit
Admission: RE | Admit: 2024-07-08 | Discharge: 2024-07-08 | Disposition: A | Discharge status: Home / Self Care | Source: Ambulatory Visit | Attending: Radiation Oncology | Admitting: Radiation Oncology

## 2024-07-08 ENCOUNTER — Other Ambulatory Visit: Payer: Self-pay

## 2024-07-08 LAB — RAD ONC ARIA SESSION SUMMARY
Course Elapsed Days: 13
Plan Fractions Treated to Date: 9
Plan Prescribed Dose Per Fraction: 4 Gy
Plan Total Fractions Prescribed: 15
Plan Total Prescribed Dose: 60 Gy
Reference Point Dosage Given to Date: 36 Gy
Reference Point Session Dosage Given: 4 Gy
Session Number: 9

## 2024-07-09 ENCOUNTER — Ambulatory Visit
Admission: RE | Admit: 2024-07-09 | Discharge: 2024-07-09 | Disposition: A | Discharge status: Home / Self Care | Source: Ambulatory Visit | Attending: Radiation Oncology | Admitting: Radiation Oncology

## 2024-07-09 ENCOUNTER — Other Ambulatory Visit: Payer: Self-pay

## 2024-07-09 LAB — RAD ONC ARIA SESSION SUMMARY
Course Elapsed Days: 14
Plan Fractions Treated to Date: 10
Plan Prescribed Dose Per Fraction: 4 Gy
Plan Total Fractions Prescribed: 15
Plan Total Prescribed Dose: 60 Gy
Reference Point Dosage Given to Date: 40 Gy
Reference Point Session Dosage Given: 4 Gy
Session Number: 10

## 2024-07-10 ENCOUNTER — Other Ambulatory Visit: Payer: Self-pay

## 2024-07-10 ENCOUNTER — Ambulatory Visit
Admission: RE | Admit: 2024-07-10 | Discharge: 2024-07-10 | Disposition: A | Discharge status: Home / Self Care | Source: Ambulatory Visit | Attending: Radiation Oncology | Admitting: Radiation Oncology

## 2024-07-10 LAB — RAD ONC ARIA SESSION SUMMARY
Course Elapsed Days: 15
Plan Fractions Treated to Date: 11
Plan Prescribed Dose Per Fraction: 4 Gy
Plan Total Fractions Prescribed: 15
Plan Total Prescribed Dose: 60 Gy
Reference Point Dosage Given to Date: 44 Gy
Reference Point Session Dosage Given: 4 Gy
Session Number: 11

## 2024-07-11 ENCOUNTER — Other Ambulatory Visit: Payer: Self-pay

## 2024-07-11 ENCOUNTER — Ambulatory Visit
Admission: RE | Admit: 2024-07-11 | Discharge: 2024-07-11 | Disposition: A | Discharge status: Home / Self Care | Source: Ambulatory Visit | Attending: Radiation Oncology | Admitting: Radiation Oncology

## 2024-07-11 LAB — RAD ONC ARIA SESSION SUMMARY
Course Elapsed Days: 16
Plan Fractions Treated to Date: 12
Plan Prescribed Dose Per Fraction: 4 Gy
Plan Total Fractions Prescribed: 15
Plan Total Prescribed Dose: 60 Gy
Reference Point Dosage Given to Date: 48 Gy
Reference Point Session Dosage Given: 4 Gy
Session Number: 12

## 2024-07-14 ENCOUNTER — Ambulatory Visit
Admission: RE | Admit: 2024-07-14 | Discharge: 2024-07-14 | Disposition: A | Source: Ambulatory Visit | Attending: Radiation Oncology | Admitting: Radiation Oncology

## 2024-07-14 ENCOUNTER — Other Ambulatory Visit: Payer: Self-pay

## 2024-07-14 LAB — RAD ONC ARIA SESSION SUMMARY
Course Elapsed Days: 19
Plan Fractions Treated to Date: 13
Plan Prescribed Dose Per Fraction: 4 Gy
Plan Total Fractions Prescribed: 15
Plan Total Prescribed Dose: 60 Gy
Reference Point Dosage Given to Date: 52 Gy
Reference Point Session Dosage Given: 4 Gy
Session Number: 13

## 2024-07-15 ENCOUNTER — Other Ambulatory Visit: Payer: Self-pay

## 2024-07-15 ENCOUNTER — Ambulatory Visit

## 2024-07-15 ENCOUNTER — Ambulatory Visit
Admission: RE | Admit: 2024-07-15 | Discharge: 2024-07-15 | Disposition: A | Source: Ambulatory Visit | Attending: Radiation Oncology | Admitting: Radiation Oncology

## 2024-07-15 LAB — RAD ONC ARIA SESSION SUMMARY
Course Elapsed Days: 20
Plan Fractions Treated to Date: 14
Plan Prescribed Dose Per Fraction: 4 Gy
Plan Total Fractions Prescribed: 15
Plan Total Prescribed Dose: 60 Gy
Reference Point Dosage Given to Date: 56 Gy
Reference Point Session Dosage Given: 4 Gy
Session Number: 14

## 2024-07-16 ENCOUNTER — Ambulatory Visit
Admission: RE | Admit: 2024-07-16 | Discharge: 2024-07-16 | Disposition: A | Discharge status: Home / Self Care | Source: Ambulatory Visit | Attending: Radiation Oncology | Admitting: Radiation Oncology

## 2024-07-16 ENCOUNTER — Other Ambulatory Visit: Payer: Self-pay

## 2024-07-16 LAB — RAD ONC ARIA SESSION SUMMARY
Course Elapsed Days: 21
Plan Fractions Treated to Date: 15
Plan Prescribed Dose Per Fraction: 4 Gy
Plan Total Fractions Prescribed: 15
Plan Total Prescribed Dose: 60 Gy
Reference Point Dosage Given to Date: 60 Gy
Reference Point Session Dosage Given: 4 Gy
Session Number: 15

## 2024-07-18 NOTE — Radiation Completion Notes (Signed)
 Patient Name: Audrey Sexton, Audrey Sexton MRN: [id number] Date of Birth: 25-Mar-1951 Referring Physician: CHARLENE SINGLE, M.D. Date of Service: 2024-07-18 Radiation Oncologist: Marcey Penton, M.D. 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  I believe she was lost to follow-up as this screening CT scan was back in January.  She recently had a PET CT scan back in September showing left upper lobe hilar nodule mildly increased in size and metabolic activity compared to prior examination.  She also has stable 0.8 cm left lower lobe nodule although not substantially less hypermetabolic.  She underwent bronchoscopy which was positive for squamous cell carcinoma.  She has been seen by thoracic surgery and declined for surgery based on her overall general condition and central location of the tumor.  Her central tumor now measures 2.3 x 1.2 cm.  She has no evidence of metastatic disease by PET/CT criteria.  She is now referred to radiation oncology for consideration of treatment.  She continues to have a mild cough no hemoptysis or chest tightness.  Patient is on continuous nasal oxygen  has history of COPD emphysema.      ==========DELIVERED PLANS==========  First Treatment Date: 2024-06-25 Last Treatment Date: 2024-07-16   Plan Name: Lung_L Site: Lung, Left Technique: IMRT Mode: Photon Dose Per Fraction: 4 Gy Prescribed Dose (Delivered / Prescribed): 60 Gy / 60 Gy Prescribed Fxs (Delivered / Prescribed): 15 / 15     ==========ON TREATMENT VISIT DATES========== 2024-07-01,  2024-07-08, 2024-07-15     ==========UPCOMING VISITS========== 08/14/2024 [facility] RAD ONCOLOGY FOLLOW UP 30 Chrystal, Glenn, MD        ==========APPENDIX - ON TREATMENT VISIT NOTES==========   See weekly On Treatment Notes in Epic for details in the Media tab (listed as Progress notes on the On Treatment Visit Dates listed above).

## 2024-07-28 MED ORDER — ENSIFENTRINE 3 MG/2.5ML IN SUSP: 3.0000 mg | Freq: Two times a day (BID) | RESPIRATORY_TRACT | 5 refills | Status: AC

## 2024-07-28 NOTE — Addendum Note (Signed)
 Addended by: Rikki Trosper L on: 07/28/2024 11:28 AM   Modules accepted: Orders

## 2024-07-28 NOTE — Telephone Encounter (Signed)
 Received fax from [facility] that patient was enrolled in PAP. Rx requested to be sent to [facility].   Rx triaged electronically to [facility]. Phone # [phone]

## 2024-07-29 ENCOUNTER — Telehealth: Payer: Self-pay

## 2024-07-29 NOTE — Telephone Encounter (Signed)
 Copied from CRM [id number]. Topic: Clinical - Prescription Issue >> Jul 29, 2024 12:14 PM Audrey Sexton wrote: Reason for CRM: pt called stated her daugher rec'd a text from [facility] stating they have not rec'd prescription for the Ohtuvayre . They stated she was approved but no prescription.

## 2024-08-14 ENCOUNTER — Other Ambulatory Visit: Payer: Self-pay | Admitting: *Deleted

## 2024-08-14 ENCOUNTER — Encounter: Payer: Self-pay | Admitting: Radiation Oncology

## 2024-08-14 ENCOUNTER — Ambulatory Visit
Admission: RE | Admit: 2024-08-14 | Discharge: 2024-08-14 | Disposition: A | Discharge status: Home / Self Care | Source: Ambulatory Visit | Attending: Radiation Oncology | Admitting: Radiation Oncology

## 2024-08-14 VITALS — BP 109/65 | HR 87 | Temp 97.3°F | Resp 16

## 2024-08-14 DIAGNOSIS — C3492 Malignant neoplasm of unspecified part of left bronchus or lung: Secondary | ICD-10-CM | POA: Diagnosis present

## 2024-08-14 DIAGNOSIS — Z923 Personal history of irradiation: Secondary | ICD-10-CM | POA: Insufficient documentation

## 2024-08-14 DIAGNOSIS — C3490 Malignant neoplasm of unspecified part of unspecified bronchus or lung: Secondary | ICD-10-CM

## 2024-08-14 DIAGNOSIS — Z9981 Dependence on supplemental oxygen: Secondary | ICD-10-CM | POA: Insufficient documentation

## 2024-08-14 NOTE — Telephone Encounter (Signed)
 Called [facility] to follow-up on this case.   They report Rx was triaged to [facility], Ph # [phone].   [facility] attempted to reach patient to confirm her shipping address and to find out if she has a nebulizer set and how old it is.   Sent MyChart message to patient regarding above.

## 2024-08-14 NOTE — Progress Notes (Signed)
 Radiation Oncology Follow up Note  Name: Audrey Sexton   Date:   08/14/2024 MRN:  [id number] DOB: 03/01/1951    This 73 y.o. 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  REFERRING PROVIDER: Hamrick, Charlene CROME, MD  HPI: Patient is a 73 year old female now out 1 month having completed ultra hypofractionated radiation therapy to a stage I A3 squamous cell carcinoma of the left lung.  Seen today in routine follow-up she is doing well she states her breathing has improved she currently is on continuous nasal oxygen .  She states she is ambulating well no specific cough hemoptysis chest tightness or any dysphagia..  COMPLICATIONS OF TREATMENT: none  FOLLOW UP COMPLIANCE: keeps appointments   PHYSICAL EXAM:  BP 109/65   Pulse 87   Temp (!) 97.3 F (36.3 C) (Tympanic)   Resp 16  Wheelchair-bound female in NAD on nasal oxygen .  Well-developed well-nourished patient in NAD. HEENT reveals PERLA, EOMI, discs not visualized.  Oral cavity is clear. No oral mucosal lesions are identified. Neck is clear without evidence of cervical or supraclavicular adenopathy. Lungs are clear to A&P. Cardiac examination is essentially unremarkable with regular rate and rhythm without murmur rub or thrill. Abdomen is benign with no organomegaly or masses noted. Motor sensory and DTR levels are equal and symmetric in the upper and lower extremities. Cranial nerves II through XII are grossly intact. Proprioception is intact. No peripheral adenopathy or edema is identified. No motor or sensory levels are noted. Crude visual fields are within normal range.  RADIOLOGY RESULTS: CT scan of the chest ordered in 3 months  PLAN: At the present time patient is doing well very low side effect profile 1 month out from hypofractionated radiation therapy.  I am pleased with her overall progress.  I  have asked to see her back in 3 months with a follow-up CT scan of her chest prior to that visit.  Patient comprehends my recommendations well.  I would like to take this opportunity to thank you for allowing me to participate in the care of your patient.SABRA Marcey Penton, MD

## 2024-08-18 ENCOUNTER — Encounter: Payer: Self-pay | Admitting: Radiation Oncology

## 2024-08-19 ENCOUNTER — Encounter: Payer: Self-pay | Admitting: *Deleted

## 2024-08-19 ENCOUNTER — Telehealth: Payer: Self-pay | Admitting: Oncology

## 2024-08-19 NOTE — Telephone Encounter (Signed)
 Per inbasket from [place], schedule pt on 3/18 with Dr.Yu, the same day she is scheduled to see Dr.Chrystal.  and call pt daughter with appts.   I called pt daughter and left vm with appt details.

## 2024-10-29 ENCOUNTER — Other Ambulatory Visit

## 2024-11-12 ENCOUNTER — Ambulatory Visit: Admitting: Radiation Oncology

## 2024-11-12 ENCOUNTER — Inpatient Hospital Stay: Admitting: Oncology
